# Patient Record
Sex: Female | Born: 1950 | Race: White | Hispanic: No | Marital: Married | Smoking: Never smoker
Health system: Southern US, Community
[De-identification: ages and names within clinical notes are randomized; demographics above are authoritative.]

## PROBLEM LIST (undated history)

## (undated) DIAGNOSIS — C4491 Basal cell carcinoma of skin, unspecified: Secondary | ICD-10-CM

## (undated) DIAGNOSIS — B159 Hepatitis A without hepatic coma: Secondary | ICD-10-CM

## (undated) DIAGNOSIS — Z91B Personal risk factor of exposure to diethylstilbestrol: Secondary | ICD-10-CM

## (undated) DIAGNOSIS — D6862 Lupus anticoagulant syndrome: Secondary | ICD-10-CM

## (undated) DIAGNOSIS — J45909 Unspecified asthma, uncomplicated: Secondary | ICD-10-CM

## (undated) DIAGNOSIS — Z9189 Other specified personal risk factors, not elsewhere classified: Secondary | ICD-10-CM

## (undated) DIAGNOSIS — O039 Complete or unspecified spontaneous abortion without complication: Secondary | ICD-10-CM

## (undated) DIAGNOSIS — G43909 Migraine, unspecified, not intractable, without status migrainosus: Secondary | ICD-10-CM

## (undated) DIAGNOSIS — Z8619 Personal history of other infectious and parasitic diseases: Secondary | ICD-10-CM

## (undated) DIAGNOSIS — C50919 Malignant neoplasm of unspecified site of unspecified female breast: Secondary | ICD-10-CM

## (undated) HISTORY — DX: Migraine, unspecified, not intractable, without status migrainosus: G43.909

## (undated) HISTORY — DX: Personal history of other infectious and parasitic diseases: Z86.19

## (undated) HISTORY — PX: BASAL CELL CARCINOMA EXCISION: SHX1214

## (undated) HISTORY — PX: BREAST LUMPECTOMY WITH AXILLARY LYMPH NODE DISSECTION: SHX5756

## (undated) HISTORY — PX: WISDOM TOOTH EXTRACTION: SHX21

## (undated) HISTORY — DX: Other specified personal risk factors, not elsewhere classified: Z91.89

## (undated) HISTORY — DX: Malignant neoplasm of unspecified site of unspecified female breast: C50.919

## (undated) HISTORY — DX: Hepatitis a without hepatic coma: B15.9

## (undated) HISTORY — DX: Lupus anticoagulant syndrome: D68.62

## (undated) HISTORY — PX: COLONOSCOPY: SHX174

## (undated) HISTORY — DX: Personal risk factor of exposure to diethylstilbestrol: Z91.B

## (undated) HISTORY — PX: DILATION AND CURETTAGE OF UTERUS: SHX78

## (undated) HISTORY — PX: ABDOMINAL HYSTERECTOMY: SHX81

## (undated) HISTORY — DX: Unspecified asthma, uncomplicated: J45.909

## (undated) HISTORY — DX: Complete or unspecified spontaneous abortion without complication: O03.9

## (undated) HISTORY — DX: Basal cell carcinoma of skin, unspecified: C44.91

---

## 1973-12-24 HISTORY — PX: BREAST BIOPSY: SHX20

## 1999-12-25 HISTORY — PX: CERVICAL FUSION: SHX112

## 2000-10-28 ENCOUNTER — Ambulatory Visit (HOSPITAL_COMMUNITY): Admission: RE | Admit: 2000-10-28 | Discharge: 2000-10-28 | Payer: Self-pay | Admitting: Neurosurgery

## 2000-10-28 ENCOUNTER — Encounter: Payer: Self-pay | Admitting: Neurosurgery

## 2004-10-04 ENCOUNTER — Ambulatory Visit: Payer: Self-pay | Admitting: Unknown Physician Specialty

## 2005-05-22 ENCOUNTER — Ambulatory Visit: Payer: Self-pay | Admitting: Unknown Physician Specialty

## 2006-02-01 ENCOUNTER — Ambulatory Visit: Payer: Self-pay | Admitting: Unknown Physician Specialty

## 2006-06-06 ENCOUNTER — Ambulatory Visit: Payer: Self-pay | Admitting: Unknown Physician Specialty

## 2007-06-10 ENCOUNTER — Ambulatory Visit: Payer: Self-pay | Admitting: Unknown Physician Specialty

## 2008-07-15 ENCOUNTER — Ambulatory Visit: Payer: Self-pay | Admitting: Unknown Physician Specialty

## 2009-07-22 ENCOUNTER — Ambulatory Visit: Payer: Self-pay | Admitting: Unknown Physician Specialty

## 2010-07-13 ENCOUNTER — Emergency Department: Payer: Self-pay | Admitting: Internal Medicine

## 2010-09-05 ENCOUNTER — Ambulatory Visit: Payer: Self-pay | Admitting: Unknown Physician Specialty

## 2011-09-21 LAB — HM PAP SMEAR

## 2011-10-24 ENCOUNTER — Ambulatory Visit: Payer: Self-pay | Admitting: Unknown Physician Specialty

## 2011-10-24 LAB — HM MAMMOGRAPHY

## 2012-01-15 ENCOUNTER — Telehealth: Payer: Self-pay | Admitting: Internal Medicine

## 2012-01-15 NOTE — Telephone Encounter (Signed)
Left message asking patient to return my call.

## 2012-01-15 NOTE — Telephone Encounter (Signed)
patient called in asking for a NP appt. I gave her one in February due to afternoon appt.  she was on a zpak at the end of Dec., along with a steroid inhaler.  she states she still has a cough, wanted to know what she should do, was previously seen with Dr. Stoney Bang.  She states that she really didn't think she needed to find a doctor until she got sick since she just had her physical in Sept.

## 2012-01-16 NOTE — Telephone Encounter (Signed)
Patient notified that we do not have any sooner new patient appts available and for acute sickness she should be seen at Urgent Care before she is seen in Urgent Care. Patient agreed and will keep new patient appt.

## 2012-02-14 ENCOUNTER — Ambulatory Visit (INDEPENDENT_AMBULATORY_CARE_PROVIDER_SITE_OTHER): Payer: BC Managed Care – PPO | Admitting: Internal Medicine

## 2012-02-14 ENCOUNTER — Encounter: Payer: Self-pay | Admitting: Internal Medicine

## 2012-02-14 DIAGNOSIS — G4700 Insomnia, unspecified: Secondary | ICD-10-CM

## 2012-02-14 DIAGNOSIS — G43909 Migraine, unspecified, not intractable, without status migrainosus: Secondary | ICD-10-CM | POA: Insufficient documentation

## 2012-02-14 DIAGNOSIS — J45909 Unspecified asthma, uncomplicated: Secondary | ICD-10-CM | POA: Insufficient documentation

## 2012-02-14 NOTE — Progress Notes (Signed)
Subjective:    [name], female    DOB: January 25, 1951, 61 y.o.   MRN: [id number]  HPI  61 year old female with history of migraines, asthma, and insomnia presents to establish care. She reports that she is generally doing well. In regards to her asthma, she notes that over the last few weeks she has been using her Flovent and albuterol because of mild cough. She describes the cough is nonproductive. She has not heard any wheezing or had any shortness of breath. No fever, chills, sore throat, rhinorrhea. She has not had recent frequent flares of her asthma. However, in the past has required oral steroids and antibiotics for bronchitis. She is not a smoker.  In regards to her migraines, she notes that they're typically well controlled with use of propanolol. She occasionally requires use of Fiorinal. She recently had a migraine this week in which she required 3 doses of Fiorinal. She describes the headaches as diffuse and associated with nausea but no vomiting. She does not have visual changes.    In regards to her insomnia, she reports control of her symptoms with the use of Ambien. She has not noted any side effects from this medication.  Outpatient Encounter Prescriptions as of 02/14/2012  Medication Sig Dispense Refill  . albuterol (PROVENTIL HFA;VENTOLIN HFA) 108 (90 BASE) MCG/ACT inhaler Inhale 2 puffs into the lungs every 6 (six) hours as needed.      . butalbital-aspirin-caffeine (FIORINAL) 50-325-40 MG per capsule Take 1 capsule by mouth 2 (two) times daily as needed.      . calcium citrate-vitamin D 200-200 MG-UNIT TABS Take 1 tablet by mouth daily.      . chlorpheniramine-HYDROcodone (TUSSIONEX) 10-8 MG/5ML LQCR Take 5 mLs by mouth every 12 (twelve) hours.      . fish oil-omega-3 fatty acids 1000 MG capsule Take 1 g by mouth 2 (two) times daily.      . fluticasone (FLOVENT HFA) 110 MCG/ACT inhaler Inhale 1 puff into the lungs 2 (two) times daily as needed.      Marland Kitchen ipratropium  (ATROVENT) 0.06 % nasal spray Place 2 sprays into the nose 3 (three) times daily.      . Melatonin 3 MG CAPS Take 1 capsule by mouth daily as needed.      . Multiple Vitamins-Minerals (MULTIVITAMIN WITH MINERALS) tablet Take 1 tablet by mouth daily.      Marland Kitchen neomycin-polymyxin-dexamethasone (MAXITROL) 0.1 % ophthalmic suspension 1 drop 4 (four) times daily.      Marland Kitchen OVER THE COUNTER MEDICATION Skin supplement along with Hyaluronic acid 60 mg      . Probiotic Product (PROBIOTIC & ACIDOPHILUS EX ST PO) Take by mouth daily.      . propranolol (INDERAL) 40 MG tablet Take 40 mg by mouth daily.       Marland Kitchen zolpidem (AMBIEN) 5 MG tablet Take 5 mg by mouth at bedtime as needed.        Review of Systems  Constitutional: Negative for fever, chills, appetite change, fatigue and unexpected weight change.  HENT: Negative for ear pain, congestion, sore throat, trouble swallowing, neck pain, voice change and sinus pressure.   Eyes: Negative for visual disturbance.  Respiratory: Positive for cough. Negative for shortness of breath, wheezing and stridor.   Cardiovascular: Negative for chest pain, palpitations and leg swelling.  Gastrointestinal: Negative for nausea, vomiting, abdominal pain, diarrhea, constipation, blood in stool, abdominal distention and anal bleeding.  Genitourinary: Negative for dysuria and flank pain.  Musculoskeletal: Negative  for myalgias, arthralgias and gait problem.  Skin: Negative for color change and rash.  Neurological: Positive for headaches. Negative for dizziness.  Hematological: Negative for adenopathy. Does not bruise/bleed easily.  Psychiatric/Behavioral: Negative for suicidal ideas, sleep disturbance and dysphoric mood. The patient is not nervous/anxious.    BP 110/60  Pulse 83  Temp(Src) 98.1 F (36.7 C) (Oral)  Ht 5' 8.5" (1.74 m)  Wt 138 lb (62.596 kg)  BMI 20.68 kg/m2  SpO2 99%     Objective:   Physical Exam  Constitutional: She is oriented to person, place, and  time. She appears well-developed and well-nourished. No distress.  HENT:  Head: Normocephalic and atraumatic.  Right Ear: External ear normal.  Left Ear: External ear normal.  Nose: Nose normal.  Mouth/Throat: Oropharynx is clear and moist. No oropharyngeal exudate.  Eyes: Conjunctivae are normal. Pupils are equal, round, and reactive to light. Right eye exhibits no discharge. Left eye exhibits no discharge. No scleral icterus.  Neck: Normal range of motion. Neck supple. No tracheal deviation present. No thyromegaly present.  Cardiovascular: Normal rate, regular rhythm, normal heart sounds and intact distal pulses.  Exam reveals no gallop and no friction rub.   No murmur heard. Pulmonary/Chest: Effort normal and breath sounds normal. No respiratory distress. She has no wheezes. She has no rales. She exhibits no tenderness.  Abdominal: Soft. Bowel sounds are normal. She exhibits no distension and no mass. There is no tenderness. There is no rebound and no guarding.  Musculoskeletal: Normal range of motion. She exhibits no edema and no tenderness.  Lymphadenopathy:    She has no cervical adenopathy.  Neurological: She is alert and oriented to person, place, and time. No cranial nerve deficit. She exhibits normal muscle tone. Coordination normal.  Skin: Skin is warm and dry. No rash noted. She is not diaphoretic. No erythema. No pallor.  Psychiatric: She has a normal mood and affect. Her behavior is normal. Judgment and thought content normal.          Assessment & Plan:

## 2012-02-14 NOTE — Assessment & Plan Note (Signed)
Symptoms well controlled with occasional use of albuterol and Flovent. Will continue. Will obtain records as to previous evaluation and management.

## 2012-02-14 NOTE — Assessment & Plan Note (Signed)
Symptoms well controlled with use of Ambien. Will continue. 

## 2012-02-14 NOTE — Assessment & Plan Note (Signed)
Symptoms well controlled with use of propanolol and occasional use of Fiorinal. Will continue. Will obtain records as to previous evaluation and management.

## 2012-02-20 ENCOUNTER — Encounter: Payer: Self-pay | Admitting: Internal Medicine

## 2012-09-24 ENCOUNTER — Encounter: Payer: Self-pay | Admitting: *Deleted

## 2012-09-24 ENCOUNTER — Other Ambulatory Visit (HOSPITAL_COMMUNITY)
Admission: RE | Admit: 2012-09-24 | Discharge: 2012-09-24 | Disposition: A | Discharge status: Home / Self Care | Payer: BC Managed Care – PPO | Source: Ambulatory Visit | Attending: Internal Medicine | Admitting: Internal Medicine

## 2012-09-24 ENCOUNTER — Encounter: Payer: Self-pay | Admitting: Internal Medicine

## 2012-09-24 ENCOUNTER — Ambulatory Visit (INDEPENDENT_AMBULATORY_CARE_PROVIDER_SITE_OTHER): Payer: BC Managed Care – PPO | Admitting: Internal Medicine

## 2012-09-24 VITALS — BP 110/80 | HR 60 | Temp 98.3°F | Ht 68.5 in | Wt 136.5 lb

## 2012-09-24 DIAGNOSIS — Z0000 Encounter for general adult medical examination without abnormal findings: Secondary | ICD-10-CM | POA: Insufficient documentation

## 2012-09-24 DIAGNOSIS — Z01419 Encounter for gynecological examination (general) (routine) without abnormal findings: Secondary | ICD-10-CM | POA: Insufficient documentation

## 2012-09-24 DIAGNOSIS — Z1151 Encounter for screening for human papillomavirus (HPV): Secondary | ICD-10-CM | POA: Insufficient documentation

## 2012-09-24 DIAGNOSIS — Z1239 Encounter for other screening for malignant neoplasm of breast: Secondary | ICD-10-CM

## 2012-09-24 LAB — COMPREHENSIVE METABOLIC PANEL
ALT: 21 U/L (ref 0–35)
BUN: 12 mg/dL (ref 6–23)
CO2: 29 mEq/L (ref 19–32)
Creatinine, Ser: 0.7 mg/dL (ref 0.4–1.2)
GFR: 95.31 mL/min (ref >60.00)
Total Bilirubin: 0.7 mg/dL (ref 0.3–1.2)

## 2012-09-24 LAB — CBC WITH DIFFERENTIAL/PLATELET
Basophils Absolute: 0.1 10*3/uL (ref 0.0–0.1)
HCT: 38.4 % (ref 36.0–46.0)
Lymphs Abs: 1.4 10*3/uL (ref 0.7–4.0)
MCV: 95.7 fl (ref 78.0–100.0)
Monocytes Absolute: 0.5 10*3/uL (ref 0.1–1.0)
Neutrophils Relative %: 64 % (ref 43.0–77.0)
Platelets: 234 10*3/uL (ref 150.0–400.0)
RDW: 12 % (ref 11.5–14.6)

## 2012-09-24 LAB — LIPID PANEL
Cholesterol: 189 mg/dL (ref 0–200)
HDL: 67.3 mg/dL (ref >39.00)
LDL Cholesterol: 108 mg/dL — ABNORMAL HIGH (ref 0–99)
Total CHOL/HDL Ratio: 3
Triglycerides: 70 mg/dL (ref 0.0–149.0)

## 2012-09-24 LAB — TSH: TSH: 2.84 u[IU]/mL (ref 0.35–5.50)

## 2012-09-24 MED ORDER — HYDROCOD POLST-CHLORPHEN POLST 10-8 MG/5ML PO LQCR: 5.0000 mL | Freq: Two times a day (BID) | ORAL | Status: DC

## 2012-09-24 MED ORDER — HYDROXYZINE HCL 10 MG PO TABS: 10.0000 mg | ORAL_TABLET | Freq: Three times a day (TID) | ORAL | Status: DC | PRN

## 2012-09-24 MED ORDER — ALBUTEROL SULFATE HFA 108 (90 BASE) MCG/ACT IN AERS: 2.0000 | INHALATION_SPRAY | Freq: Four times a day (QID) | RESPIRATORY_TRACT | Status: DC | PRN

## 2012-09-24 MED ORDER — IPRATROPIUM BROMIDE 0.06 % NA SOLN: 2.0000 | Freq: Three times a day (TID) | NASAL | Status: DC

## 2012-09-24 MED ORDER — ZOLPIDEM TARTRATE 5 MG PO TABS: 5.0000 mg | ORAL_TABLET | Freq: Every evening | ORAL | Status: DC | PRN

## 2012-09-24 MED ORDER — PROPRANOLOL HCL 40 MG PO TABS: 40.0000 mg | ORAL_TABLET | Freq: Every day | ORAL | Status: DC

## 2012-09-24 MED ORDER — BUTALBITAL-ASA-CAFFEINE 50-325-40 MG PO CAPS: 1.0000 | ORAL_CAPSULE | Freq: Two times a day (BID) | ORAL | Status: DC | PRN

## 2012-09-24 MED ORDER — FLUTICASONE PROPIONATE HFA 110 MCG/ACT IN AERO: 1.0000 | INHALATION_SPRAY | Freq: Two times a day (BID) | RESPIRATORY_TRACT | Status: DC | PRN

## 2012-09-24 NOTE — Progress Notes (Signed)
Subjective:    [name], female    DOB: June 24, 1951, 61 y.o.   MRN: [id number]  HPI 61 year old female with history of migraine headaches and asthma presents for annual exam. She reports she is generally feeling well. Over the last couple of weeks she has noticed some increased nasal drainage and hoarseness in her voice which she attributes to allergies. She has not noticed any shortness of breath or wheezing. She has not needed to use her rescue inhaler. She generally reports she is doing well. She is very active and follows a healthy diet. She is up to date on health maintenance with the exception of flu vaccine and mammogram.  Outpatient Encounter Prescriptions as of 09/24/2012  Medication Sig Dispense Refill  . albuterol (PROVENTIL HFA;VENTOLIN HFA) 108 (90 BASE) MCG/ACT inhaler Inhale 2 puffs into the lungs every 6 (six) hours as needed.  3.7 g  6  . butalbital-aspirin-caffeine (FIORINAL) 50-325-40 MG per capsule Take 1 capsule by mouth 2 (two) times daily as needed for headache.  60 capsule  4  . calcium citrate-vitamin D 200-200 MG-UNIT TABS Take 1 tablet by mouth daily.      . chlorpheniramine-HYDROcodone (TUSSIONEX) 10-8 MG/5ML LQCR Take 5 mLs by mouth every 12 (twelve) hours.  140 mL  0  . fish oil-omega-3 fatty acids 1000 MG capsule Take 1 g by mouth 2 (two) times daily.      . fluticasone (FLOVENT HFA) 110 MCG/ACT inhaler Inhale 1 puff into the lungs 2 (two) times daily as needed.  1 Inhaler  6  . ipratropium (ATROVENT) 0.06 % nasal spray Place 2 sprays into the nose 3 (three) times daily.  30 mL  6  . Melatonin 3 MG CAPS Take 1 capsule by mouth daily as needed.      . Multiple Vitamins-Minerals (MULTIVITAMIN WITH MINERALS) tablet Take 1 tablet by mouth daily.      . propranolol (INDERAL) 40 MG tablet Take 1 tablet (40 mg total) by mouth daily.  30 tablet  6  . zolpidem (AMBIEN) 5 MG tablet Take 1 tablet (5 mg total) by mouth at bedtime as needed.  30 tablet  4   BP  110/80  Pulse 60  Temp 98.3 F (36.8 C) (Oral)  Ht 5' 8.5" (1.74 m)  Wt 136 lb 8 oz (61.916 kg)  BMI 20.45 kg/m2  SpO2 99%  Review of Systems  Constitutional: Negative for fever, chills, appetite change, fatigue and unexpected weight change.  HENT: Negative for ear pain, congestion, sore throat, trouble swallowing, neck pain, voice change and sinus pressure.   Eyes: Negative for visual disturbance.  Respiratory: Negative for cough, shortness of breath, wheezing and stridor.   Cardiovascular: Negative for chest pain, palpitations and leg swelling.  Gastrointestinal: Negative for nausea, vomiting, abdominal pain, diarrhea, constipation, blood in stool, abdominal distention and anal bleeding.  Genitourinary: Negative for dysuria and flank pain.  Musculoskeletal: Negative for myalgias, arthralgias and gait problem.  Skin: Negative for color change and rash.  Neurological: Negative for dizziness and headaches.  Hematological: Negative for adenopathy. Does not bruise/bleed easily.  Psychiatric/Behavioral: Negative for suicidal ideas, disturbed wake/sleep cycle and dysphoric mood. The patient is not nervous/anxious.        Objective:   Physical Exam  Constitutional: She is oriented to person, place, and time. She appears well-developed and well-nourished. No distress.  HENT:  Head: Normocephalic and atraumatic.  Right Ear: External ear normal.  Left Ear: External ear normal.  Nose: Nose  normal.  Mouth/Throat: Oropharynx is clear and moist. No oropharyngeal exudate.  Eyes: Conjunctivae normal are normal. Pupils are equal, round, and reactive to light. Right eye exhibits no discharge. Left eye exhibits no discharge. No scleral icterus.  Neck: Normal range of motion. Neck supple. No tracheal deviation present. No thyromegaly present.  Cardiovascular: Normal rate, regular rhythm, normal heart sounds and intact distal pulses.  Exam reveals no gallop and no friction rub.   No murmur  heard. Pulmonary/Chest: Effort normal and breath sounds normal. No respiratory distress. She has no wheezes. She has no rales. She exhibits no tenderness.  Abdominal: Soft. Bowel sounds are normal. She exhibits no distension and no mass. There is no tenderness. There is no rebound and no guarding.  Genitourinary: Rectum normal, vagina normal and uterus normal. No breast swelling, tenderness, discharge or bleeding. Pelvic exam was performed with patient supine. There is no rash, tenderness or lesion on the right labia. There is no rash, tenderness or lesion on the left labia. Uterus is not enlarged and not tender. Cervix exhibits no motion tenderness, no discharge and no friability. Right adnexum displays no mass, no tenderness and no fullness. Left adnexum displays no mass, no tenderness and no fullness. No erythema or tenderness around the vagina. No vaginal discharge found.  Musculoskeletal: Normal range of motion. She exhibits no edema and no tenderness.  Lymphadenopathy:    She has no cervical adenopathy.  Neurological: She is alert and oriented to person, place, and time. No cranial nerve deficit. She exhibits normal muscle tone. Coordination normal.  Skin: Skin is warm and dry. No rash noted. She is not diaphoretic. No erythema. No pallor.  Psychiatric: She has a normal mood and affect. Her behavior is normal. Judgment and thought content normal.          Assessment & Plan:

## 2012-09-24 NOTE — Assessment & Plan Note (Signed)
General medical exam including breast and pelvic exam normal today. PAP pending. Mammogram scheduled. Pt will get flu vaccine at local pharmacy. Labs today including CBC, CMP, lipids, TSH. Follow up 1 year and prn.

## 2012-09-25 LAB — VITAMIN D 25 HYDROXY (VIT D DEFICIENCY, FRACTURES): Vit D, 25-Hydroxy: 48 ng/mL (ref 30–89)

## 2012-10-10 LAB — HM PAP SMEAR: HM Pap smear: NORMAL

## 2012-10-27 ENCOUNTER — Ambulatory Visit: Payer: Self-pay | Admitting: Internal Medicine

## 2012-10-31 ENCOUNTER — Telehealth: Payer: Self-pay | Admitting: Internal Medicine

## 2012-10-31 DIAGNOSIS — R928 Other abnormal and inconclusive findings on diagnostic imaging of breast: Secondary | ICD-10-CM

## 2012-10-31 NOTE — Telephone Encounter (Signed)
Cindy Arnold's mammogram came back incomplete. He would like to get additional films of the right breast. Please make sure that they have contacted her regarding this.  I have printed up the order for the additional views.

## 2012-10-31 NOTE — Telephone Encounter (Signed)
Patient notified via phone of results.

## 2012-11-12 ENCOUNTER — Ambulatory Visit: Payer: Self-pay | Admitting: Internal Medicine

## 2012-11-13 ENCOUNTER — Encounter: Payer: Self-pay | Admitting: Internal Medicine

## 2012-11-13 ENCOUNTER — Telehealth: Payer: Self-pay | Admitting: Internal Medicine

## 2012-11-13 DIAGNOSIS — R928 Other abnormal and inconclusive findings on diagnostic imaging of breast: Secondary | ICD-10-CM

## 2012-11-13 NOTE — Telephone Encounter (Signed)
Spoke with patient and reviewed recent ultrasound and mammogram of the breast which showed hypo-echogenic area in the right breast measuring less than 1 cm. Recommended surgical evaluation with possible biopsy. Patient would like to discuss with family and decide on preferred surgeon. She will e-mail or call back with the name of surgeon she would wish to see.

## 2012-11-14 ENCOUNTER — Encounter: Payer: Self-pay | Admitting: Internal Medicine

## 2012-11-17 ENCOUNTER — Encounter: Payer: Self-pay | Admitting: Internal Medicine

## 2012-11-18 ENCOUNTER — Telehealth: Payer: Self-pay | Admitting: General Practice

## 2012-11-18 NOTE — Telephone Encounter (Signed)
Cindy Arnold, Can you look into this?  This is an important referral that should have been set up 11/21.  We should apologize to the pt and try to figure out how this was not set up.

## 2012-11-18 NOTE — Telephone Encounter (Signed)
Yes, called pt she stated that she was unhappy in regards to not getting a call from [place] in regards to her referral for a biopsy of her rt breast. She stated that her OBGYN referred her to a specialist at [facility].

## 2012-11-18 NOTE — Telephone Encounter (Signed)
Pt states that she wanted to know what the status of the referral had been for the biopsy on her Rt Breast. Stated [facility] Noe never got back with her in regards to this appt. Pt stated that she got her own appt with [facility] Dr. Marcie Mowers.

## 2012-11-21 ENCOUNTER — Encounter: Payer: Self-pay | Admitting: Internal Medicine

## 2012-11-23 DIAGNOSIS — C50919 Malignant neoplasm of unspecified site of unspecified female breast: Secondary | ICD-10-CM

## 2012-11-23 HISTORY — DX: Malignant neoplasm of unspecified site of unspecified female breast: C50.919

## 2012-12-03 DIAGNOSIS — C50919 Malignant neoplasm of unspecified site of unspecified female breast: Secondary | ICD-10-CM | POA: Insufficient documentation

## 2012-12-03 DIAGNOSIS — Z853 Personal history of malignant neoplasm of breast: Secondary | ICD-10-CM | POA: Insufficient documentation

## 2013-01-01 IMAGING — MG MM CAD SCREENING MAMMO
1 series · 4 of 4 positions shown · non-contrast
Comparison: none

REASON FOR EXAM: SCR MAMMO NO ORDER
COMMENTS:

PROCEDURE:     MAM - MAM DGTL SCRN MAM NO ORDER W/CAD  - October 27, 2012  [DATE]
RESULT:

[R CC · right · 4 of 4 slices shown]
[im 1/4]
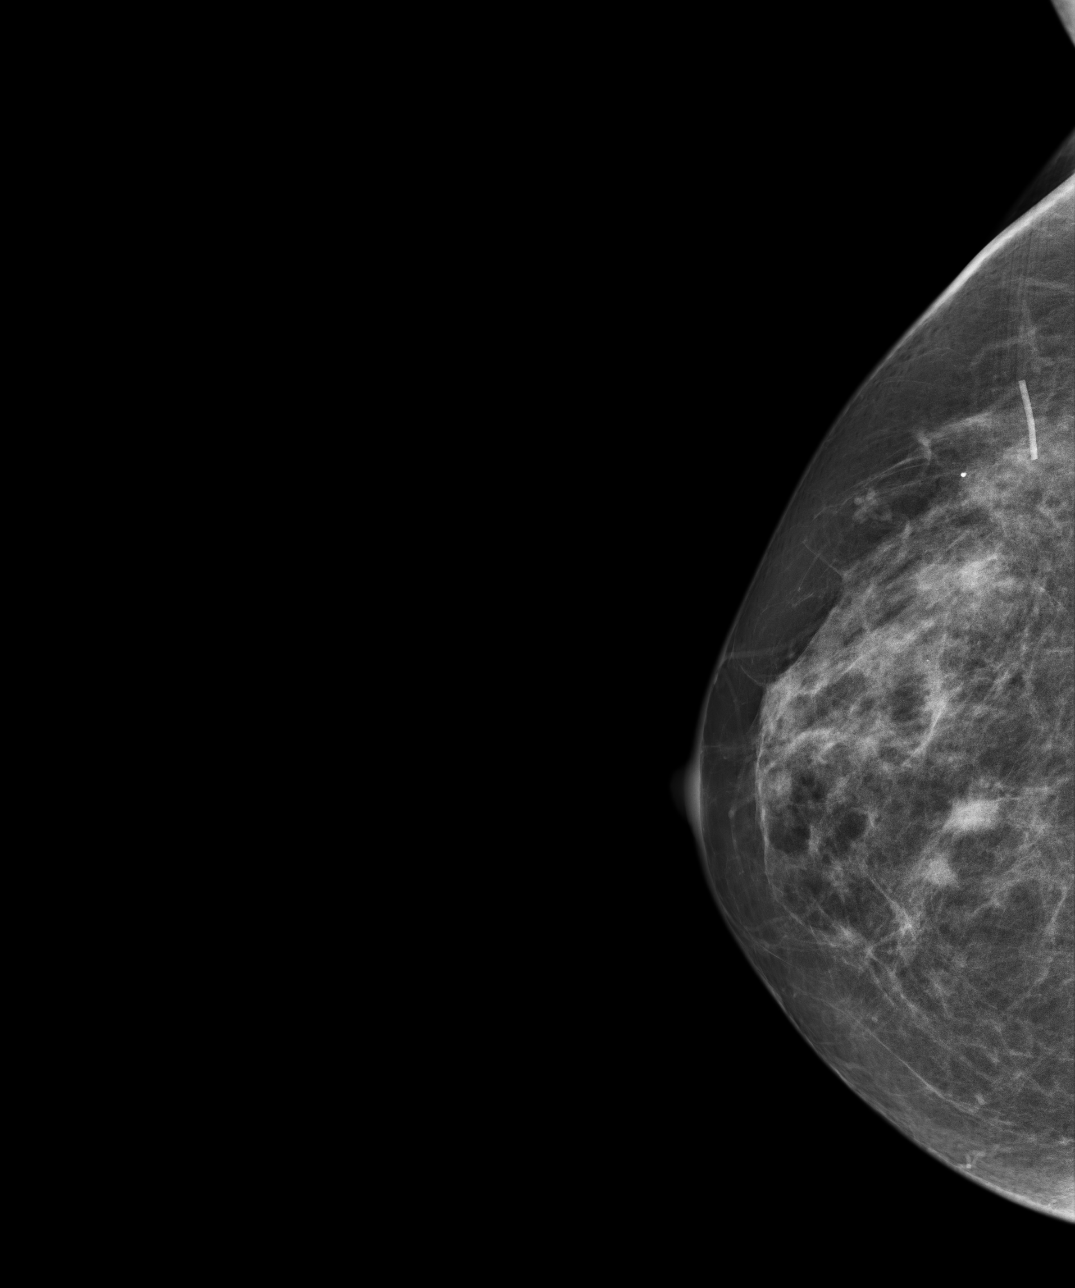
[im 2/4]
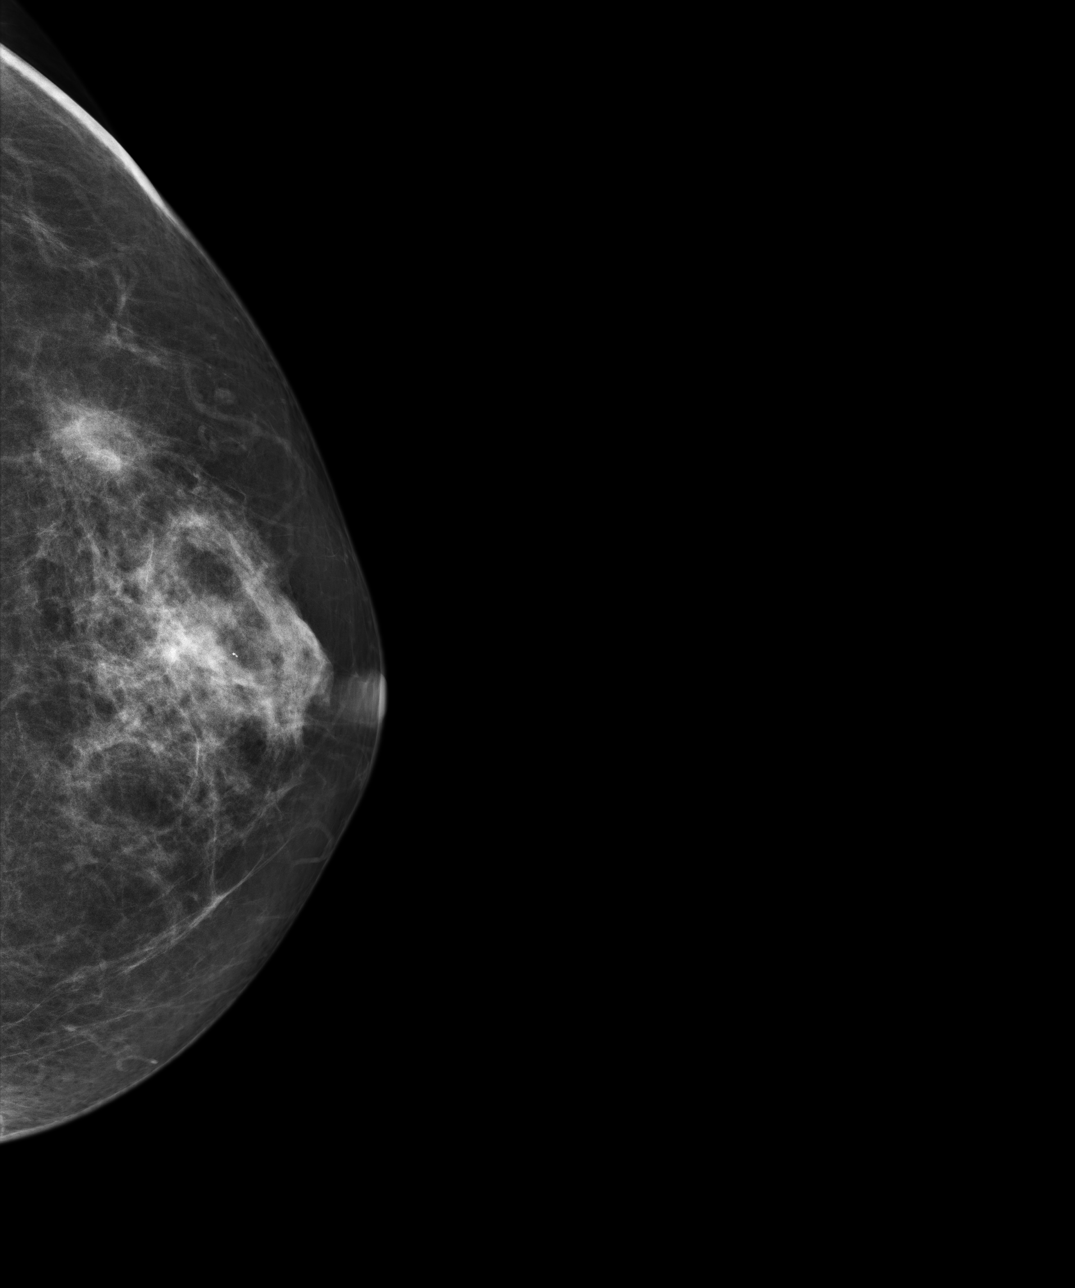
[im 3/4]
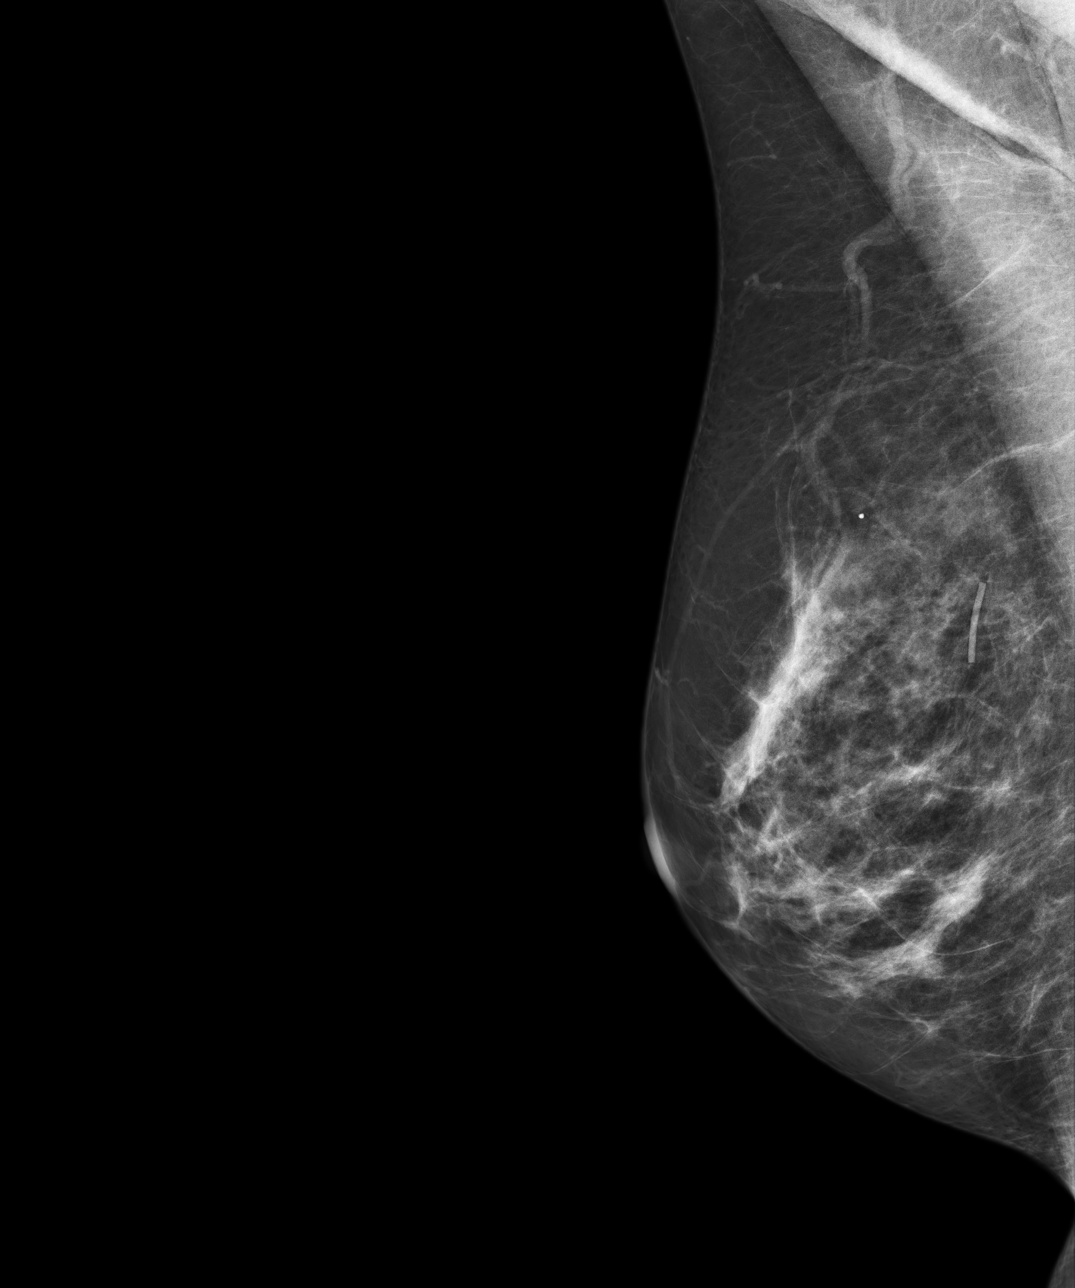
[im 4/4]
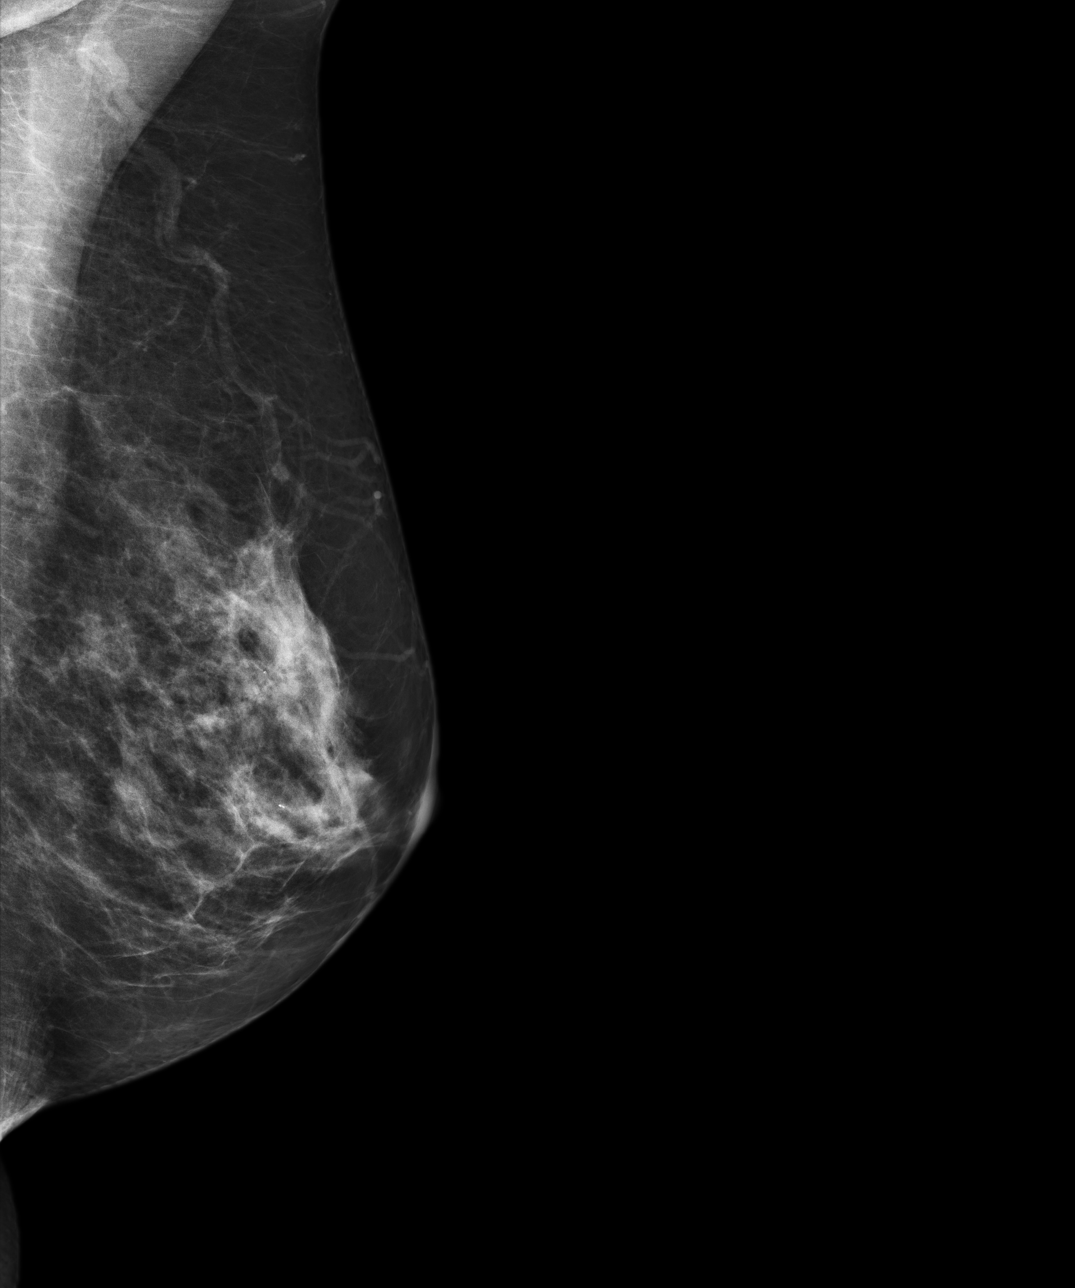

[4 of 4 positions shown; findings below may reference images not displayed]

FINDINGS: The patient reports a family history of breast cancer in two
maternal aunts. The patient has undergone a previous right breast biopsy in
1137 which was negative. Scar marker is placed in the upper outer posterior
right breast.

Images are compared to the previous digital exams dated 24 October, 2011 as
well as 05 September, 2010,22 July, 2009 and to images dated 02 December, 2003.

The breasts exhibit a moderately dense parenchymal pattern. There is new
parenchymal density in the inferior medial right breast. Additional
magnification compression of this is recommended. The appearance is
otherwise stable. Scattered, benign appearing calcifications are present.
IMPRESSION: BI-RADS: Category 0 - Needs Additional Imaging Evaluation

RECOMMENDATION:  Please have the patient return for additional magnification
compression MLO and CC views in the inferomedial right breast. The area is
marked and saved electronically. Additional medial to lateral images also
recommended. Ultrasound may be necessary for complete evaluation.

[REDACTED]

Thank you for this opportunity to contribute to the care of your patient.

A NEGATIVE MAMMOGRAM REPORT DOES NOT PRECLUDE BIOPSY OR OTHER EVALUATION OF
A CLINICALLY PALPABLE OR OTHERWISE SUSPICIOUS MASS OR LESION. BREAST CANCER
MAY NOT BE DETECTED BY MAMMOGRAPHY IN UP TO 10% OF CASES.

## 2013-02-07 ENCOUNTER — Other Ambulatory Visit: Payer: Self-pay

## 2013-07-16 ENCOUNTER — Other Ambulatory Visit: Payer: Self-pay | Admitting: *Deleted

## 2013-07-17 MED ORDER — BUTALBITAL-ASA-CAFFEINE 50-325-40 MG PO CAPS: 1.0000 | ORAL_CAPSULE | Freq: Two times a day (BID) | ORAL | Status: DC | PRN

## 2013-09-23 ENCOUNTER — Ambulatory Visit (INDEPENDENT_AMBULATORY_CARE_PROVIDER_SITE_OTHER): Payer: BC Managed Care – PPO | Admitting: Adult Health

## 2013-09-23 ENCOUNTER — Encounter: Payer: Self-pay | Admitting: Adult Health

## 2013-09-23 VITALS — BP 102/68 | HR 65 | Temp 97.9°F | Resp 12 | Wt 143.5 lb

## 2013-09-23 DIAGNOSIS — R300 Dysuria: Secondary | ICD-10-CM | POA: Insufficient documentation

## 2013-09-23 LAB — POCT URINALYSIS DIPSTICK
Bilirubin, UA: NEGATIVE
Glucose, UA: NEGATIVE
Ketones, UA: NEGATIVE
Nitrite, UA: POSITIVE
Protein, UA: 100
Urobilinogen, UA: 0.2

## 2013-09-23 MED ORDER — CIPROFLOXACIN HCL 250 MG PO TABS: 250.0000 mg | ORAL_TABLET | Freq: Two times a day (BID) | ORAL | Status: DC

## 2013-09-23 NOTE — Assessment & Plan Note (Signed)
UA dipstick shows 3+ leukocytes, positive nitrites and blood. Send urine for culture. Start Cipro 250 mg twice a day x5 days. RTC if symptoms are not improved within 3-4 days. May also try over-the-counter AZO for symptom management.

## 2013-09-23 NOTE — Progress Notes (Signed)
  Subjective:    [name], female    DOB: Aug 19, 1951, 62 y.o.   MRN: [id number]  HPI  Patient is a pleasant 62 year old female who presents to clinic with complaints of urgency, frequency, hematuria with clots. Symptoms began this morning. She is chilled but no fever. UA dipstick was obtained.  Review of Systems  Constitutional: Positive for chills. Negative for fever.  Genitourinary: Positive for dysuria, urgency, frequency and hematuria. Negative for flank pain.       Objective:   Physical Exam  Constitutional: She is oriented to person, place, and time. She appears well-developed and well-nourished. No distress.  Cardiovascular: Normal rate and regular rhythm.   Pulmonary/Chest: Effort normal. No respiratory distress.  Genitourinary:   No suprapubic discomfort or costovertebral angle tenderness  Neurological: She is alert and oriented to person, place, and time.  Psychiatric: She has a normal mood and affect. Her behavior is normal. Judgment and thought content normal.          Assessment & Plan:

## 2013-09-23 NOTE — Patient Instructions (Addendum)
  Start Cipro 250 mg twice a day for 5 days.  Try over the counter AZO for your urinary symptoms. Do not take more than 3 days.  Urinary Tract Infection Urinary tract infections (UTIs) can develop anywhere along your urinary tract. Your urinary tract is your body's drainage system for removing wastes and extra water. Your urinary tract includes two kidneys, two ureters, a bladder, and a urethra. Your kidneys are a pair of bean-shaped organs. Each kidney is about the size of your fist. They are located below your ribs, one on each side of your spine. CAUSES Infections are caused by microbes, which are microscopic organisms, including fungi, viruses, and bacteria. These organisms are so small that they can only be seen through a microscope. Bacteria are the microbes that most commonly cause UTIs. SYMPTOMS  Symptoms of UTIs may vary by age and gender of the patient and by the location of the infection. Symptoms in young women typically include a frequent and intense urge to urinate and a painful, burning feeling in the bladder or urethra during urination. Older women and men are more likely to be tired, shaky, and weak and have muscle aches and abdominal pain. A fever may mean the infection is in your kidneys. Other symptoms of a kidney infection include pain in your back or sides below the ribs, nausea, and vomiting. DIAGNOSIS To diagnose a UTI, your caregiver will ask you about your symptoms. Your caregiver also will ask to provide a urine sample. The urine sample will be tested for bacteria and white blood cells. White blood cells are made by your body to help fight infection. TREATMENT  Typically, UTIs can be treated with medication. Because most UTIs are caused by a bacterial infection, they usually can be treated with the use of antibiotics. The choice of antibiotic and length of treatment depend on your symptoms and the type of bacteria causing your infection. HOME CARE INSTRUCTIONS  If you were  prescribed antibiotics, take them exactly as your caregiver instructs you. Finish the medication even if you feel better after you have only taken some of the medication.  Drink enough water and fluids to keep your urine clear or pale yellow.  Avoid caffeine, tea, and carbonated beverages. They tend to irritate your bladder.  Empty your bladder often. Avoid holding urine for long periods of time.  Empty your bladder before and after sexual intercourse.  After a bowel movement, women should cleanse from front to back. Use each tissue only once. SEEK MEDICAL CARE IF:   You have back pain.  You develop a fever.  Your symptoms do not begin to resolve within 3 days. SEEK IMMEDIATE MEDICAL CARE IF:   You have severe back pain or lower abdominal pain.  You develop chills.  You have nausea or vomiting.  You have continued burning or discomfort with urination. MAKE SURE YOU:   Understand these instructions.  Will watch your condition.  Will get help right away if you are not doing well or get worse. Document Released: 09/19/2005 Document Revised: 06/10/2012 Document Reviewed: 01/18/2012 [facility] Patient Information 2014 [place], [place].

## 2013-09-26 LAB — URINE CULTURE

## 2013-10-06 DIAGNOSIS — M8580 Other specified disorders of bone density and structure, unspecified site: Secondary | ICD-10-CM | POA: Insufficient documentation

## 2013-10-06 DIAGNOSIS — M899 Disorder of bone, unspecified: Secondary | ICD-10-CM | POA: Insufficient documentation

## 2013-10-23 ENCOUNTER — Encounter: Payer: BC Managed Care – PPO | Admitting: Internal Medicine

## 2013-10-25 ENCOUNTER — Emergency Department: Payer: Self-pay | Admitting: Emergency Medicine

## 2013-10-29 ENCOUNTER — Other Ambulatory Visit: Payer: Self-pay

## 2013-11-05 ENCOUNTER — Encounter: Payer: Self-pay | Admitting: Internal Medicine

## 2013-11-05 ENCOUNTER — Telehealth: Payer: Self-pay | Admitting: *Deleted

## 2013-11-05 ENCOUNTER — Ambulatory Visit (INDEPENDENT_AMBULATORY_CARE_PROVIDER_SITE_OTHER): Payer: BC Managed Care – PPO | Admitting: Internal Medicine

## 2013-11-05 VITALS — BP 138/90 | HR 82 | Temp 98.2°F | Ht 68.85 in | Wt 141.5 lb

## 2013-11-05 DIAGNOSIS — C50919 Malignant neoplasm of unspecified site of unspecified female breast: Secondary | ICD-10-CM

## 2013-11-05 DIAGNOSIS — Z0000 Encounter for general adult medical examination without abnormal findings: Secondary | ICD-10-CM

## 2013-11-05 DIAGNOSIS — R079 Chest pain, unspecified: Secondary | ICD-10-CM

## 2013-11-05 DIAGNOSIS — Z23 Encounter for immunization: Secondary | ICD-10-CM | POA: Insufficient documentation

## 2013-11-05 DIAGNOSIS — R0781 Pleurodynia: Secondary | ICD-10-CM

## 2013-11-05 DIAGNOSIS — C50911 Malignant neoplasm of unspecified site of right female breast: Secondary | ICD-10-CM

## 2013-11-05 LAB — CBC WITH DIFFERENTIAL/PLATELET
Basophils Relative: 0.3 % (ref 0.0–3.0)
Eosinophils Relative: 1.9 % (ref 0.0–5.0)
HCT: 38.5 % (ref 36.0–46.0)
Hemoglobin: 13.1 g/dL (ref 12.0–15.0)
Lymphocytes Relative: 16.9 % (ref 12.0–46.0)
Lymphs Abs: 1.1 10*3/uL (ref 0.7–4.0)
Monocytes Relative: 5.6 % (ref 3.0–12.0)
Neutro Abs: 4.7 10*3/uL (ref 1.4–7.7)
Neutrophils Relative %: 75.3 % (ref 43.0–77.0)
RDW: 12.6 % (ref 11.5–14.6)
WBC: 6.3 10*3/uL (ref 4.5–10.5)

## 2013-11-05 LAB — LIPID PANEL
Cholesterol: 167 mg/dL (ref 0–200)
HDL: 61 mg/dL
LDL Cholesterol: 93 mg/dL (ref 0–99)
Total CHOL/HDL Ratio: 3
Triglycerides: 66 mg/dL (ref 0.0–149.0)
VLDL: 13.2 mg/dL (ref 0.0–40.0)

## 2013-11-05 LAB — MICROALBUMIN / CREATININE URINE RATIO: Microalb Creat Ratio: 0.6 mg/g (ref 0.0–30.0)

## 2013-11-05 LAB — COMPREHENSIVE METABOLIC PANEL WITH GFR
ALT: 14 U/L (ref 0–35)
AST: 18 U/L (ref 0–37)
Albumin: 3.9 g/dL (ref 3.5–5.2)
Alkaline Phosphatase: 75 U/L (ref 39–117)
BUN: 11 mg/dL (ref 6–23)
CO2: 29 meq/L (ref 19–32)
Calcium: 8.7 mg/dL (ref 8.4–10.5)
Chloride: 104 meq/L (ref 96–112)
Creatinine, Ser: 0.6 mg/dL (ref 0.4–1.2)
GFR: 105.81 mL/min
Glucose, Bld: 96 mg/dL (ref 70–99)
Potassium: 3.6 meq/L (ref 3.5–5.1)
Sodium: 138 meq/L (ref 135–145)
Total Bilirubin: 0.9 mg/dL (ref 0.3–1.2)
Total Protein: 7.1 g/dL (ref 6.0–8.3)

## 2013-11-05 LAB — TSH: TSH: 1.8 u[IU]/mL (ref 0.35–5.50)

## 2013-11-05 MED ORDER — IPRATROPIUM BROMIDE 0.06 % NA SOLN: 2.0000 | Freq: Three times a day (TID) | NASAL | Status: DC

## 2013-11-05 NOTE — Telephone Encounter (Signed)
Patient informed and verbalized understanding

## 2013-11-05 NOTE — Telephone Encounter (Signed)
Patient left a message on voicemail stating you mentioned something to her about getting a CXR done at [facility]. She did not see an order and would like to know what she needs to do to have this done?

## 2013-11-05 NOTE — Progress Notes (Signed)
Subjective:    [name], female    DOB: 11-Apr-1951, 62 y.o.   MRN: [id number]  HPI 62YO female presents for annual exam. Diagnosed with right breast cancer 10/2012 after having mammogram which showed abnormality. S/p lumpectomy and then additional resection because of questionable margin. S/p chemotherapy and XRT. Tolerated well, completed at [facility]. Concerned about several weeks of mild right rib pain lateral to lumpectomy site which is made worse with movement or deep inspiration. No cough, fever. Not taking any medication for this.  Otherwise, doing well. Back at work full time. Trying to follow a healthy diet and get regular exercise. Had some recent issues with left foot pain, however evaluation including plain films were normal and pain has completely resolved.  Outpatient Encounter Prescriptions as of 11/05/2013  Medication Sig  . albuterol (PROVENTIL HFA;VENTOLIN HFA) 108 (90 BASE) MCG/ACT inhaler Inhale 2 puffs into the lungs every 6 (six) hours as needed.  . butalbital-aspirin-caffeine (FIORINAL) 50-325-40 MG per capsule Take 1 capsule by mouth 2 (two) times daily as needed for headache.  . Calcium-Magnesium-Vitamin D (CALCIUM 500 PO) Take 2 tablets by mouth daily.  . chlorpheniramine-HYDROcodone (TUSSIONEX) 10-8 MG/5ML LQCR Take 5 mLs by mouth every 12 (twelve) hours as needed.  . fish oil-omega-3 fatty acids 1000 MG capsule Take 1 g by mouth 2 (two) times daily.  . fluticasone (FLOVENT HFA) 110 MCG/ACT inhaler Inhale 1 puff into the lungs 2 (two) times daily as needed.  . hydrOXYzine (ATARAX/VISTARIL) 10 MG tablet Take 1 tablet (10 mg total) by mouth 3 (three) times daily as needed for itching.  Marland Kitchen ipratropium (ATROVENT) 0.06 % nasal spray Place 2 sprays into the nose 3 (three) times daily.  Marland Kitchen letrozole (FEMARA) 2.5 MG tablet Take 2.5 mg by mouth daily.  . Melatonin 3 MG CAPS Take 1 capsule by mouth daily as needed.  . Multiple Vitamins-Minerals  (MULTIVITAMIN WITH MINERALS) tablet Take 1 tablet by mouth daily.  . propranolol (INDERAL) 40 MG tablet Take 1 tablet (40 mg total) by mouth daily.  Marland Kitchen zolpidem (AMBIEN) 5 MG tablet Take 1 tablet (5 mg total) by mouth at bedtime as needed.   BP 138/90  Pulse 82  Temp(Src) 98.2 F (36.8 C) (Oral)  Ht 5' 8.85" (1.749 m)  Wt 141 lb 8 oz (64.184 kg)  BMI 20.98 kg/m2  SpO2 96%  Review of Systems  Constitutional: Negative for fever, chills, appetite change, fatigue and unexpected weight change.  HENT: Negative for congestion, ear pain, sinus pressure, sore throat, trouble swallowing and voice change.   Eyes: Negative for visual disturbance.  Respiratory: Negative for cough, shortness of breath, wheezing and stridor.   Cardiovascular: Positive for chest pain (right rib pain). Negative for palpitations and leg swelling.  Gastrointestinal: Negative for nausea, vomiting, abdominal pain, diarrhea, constipation, blood in stool, abdominal distention and anal bleeding.  Genitourinary: Negative for dysuria and flank pain.  Musculoskeletal: Negative for arthralgias, gait problem, myalgias and neck pain.  Skin: Negative for color change and rash.  Neurological: Negative for dizziness and headaches.  Hematological: Negative for adenopathy. Does not bruise/bleed easily.  Psychiatric/Behavioral: Negative for suicidal ideas, sleep disturbance and dysphoric mood. The patient is not nervous/anxious.        Objective:   Physical Exam  Constitutional: She is oriented to person, place, and time. She appears well-developed and well-nourished. No distress.  HENT:  Head: Normocephalic and atraumatic.  Right Ear: External ear normal.  Left Ear: External ear normal.  Nose: Nose normal.  Mouth/Throat: Oropharynx is clear and moist. No oropharyngeal exudate.  Eyes: Conjunctivae are normal. Pupils are equal, round, and reactive to light. Right eye exhibits no discharge. Left eye exhibits no discharge. No scleral  icterus.  Neck: Normal range of motion. Neck supple. No tracheal deviation present. No thyromegaly present.  Cardiovascular: Normal rate, regular rhythm, normal heart sounds and intact distal pulses.  Exam reveals no gallop and no friction rub.   No murmur heard. Pulmonary/Chest: Effort normal and breath sounds normal. No accessory muscle usage. Not tachypneic. No respiratory distress. She has no decreased breath sounds. She has no wheezes. She has no rhonchi. She has no rales. She exhibits no tenderness. Right breast exhibits no inverted nipple, no mass, no nipple discharge, no skin change and no tenderness. Left breast exhibits no inverted nipple, no mass, no nipple discharge, no skin change and no tenderness. Breasts are symmetrical.    Abdominal: Soft. Bowel sounds are normal. She exhibits no distension and no mass. There is no tenderness. There is no rebound and no guarding.  Musculoskeletal: Normal range of motion. She exhibits no edema and no tenderness.  Lymphadenopathy:    She has no cervical adenopathy.  Neurological: She is alert and oriented to person, place, and time. No cranial nerve deficit. She exhibits normal muscle tone. Coordination normal.  Skin: Skin is warm and dry. No rash noted. She is not diaphoretic. No erythema. No pallor.  Psychiatric: She has a normal mood and affect. Her behavior is normal. Judgment and thought content normal.          Assessment & Plan:

## 2013-11-05 NOTE — Progress Notes (Signed)
Pre-visit discussion using our clinic review tool. No additional management support is needed unless otherwise documented below in the visit note.  

## 2013-11-05 NOTE — Assessment & Plan Note (Signed)
Right sided rib pain, exacerbated with movement and deep inspiration. Exam normal except for mild edema and erythema at site of radiation therapy and lumpectomy. Will get CXR for further evaluation.

## 2013-11-05 NOTE — Telephone Encounter (Signed)
Yes, I put the order in for her CXR at [facility]. She can go there anytime between 8am and 5pm. She does not need an appointment.

## 2013-11-05 NOTE — Assessment & Plan Note (Signed)
Reviewed course of treatment with her including lumpectomy, chemotherapy and XRT. Discussed long term monitoring of thyroid function and periodic ECHO to evaluate toxicity from chemo and radiation.

## 2013-11-05 NOTE — Assessment & Plan Note (Signed)
General medical exam normal today including breast exam except as noted. PAP and pelvic deferred as normal in 2013. Mammogram pending for later this month, to be completed at [facility]. Will check labs today including CBC, CMP, lipids, TSH, Vit D. Encouraged healthy diet and regular exercise. Pneumovax given today, given pt h/o asthma.

## 2013-11-06 LAB — VITAMIN D 25 HYDROXY (VIT D DEFICIENCY, FRACTURES): Vit D, 25-Hydroxy: 58 ng/mL (ref 30–89)

## 2013-11-09 ENCOUNTER — Encounter: Payer: Self-pay | Admitting: Internal Medicine

## 2013-11-09 ENCOUNTER — Ambulatory Visit (INDEPENDENT_AMBULATORY_CARE_PROVIDER_SITE_OTHER)
Admission: RE | Admit: 2013-11-09 | Discharge: 2013-11-09 | Disposition: A | Discharge status: Home / Self Care | Payer: BC Managed Care – PPO | Source: Ambulatory Visit | Attending: Internal Medicine | Admitting: Internal Medicine

## 2013-11-09 DIAGNOSIS — R0781 Pleurodynia: Secondary | ICD-10-CM

## 2013-11-09 DIAGNOSIS — R079 Chest pain, unspecified: Secondary | ICD-10-CM

## 2013-11-13 ENCOUNTER — Encounter: Payer: Self-pay | Admitting: Internal Medicine

## 2013-12-09 ENCOUNTER — Encounter: Payer: Self-pay | Admitting: Internal Medicine

## 2013-12-10 LAB — HM MAMMOGRAPHY

## 2014-01-02 ENCOUNTER — Other Ambulatory Visit: Payer: Self-pay | Admitting: Internal Medicine

## 2014-01-02 NOTE — Telephone Encounter (Signed)
Okay to refill? 

## 2014-01-04 ENCOUNTER — Other Ambulatory Visit: Payer: Self-pay | Admitting: *Deleted

## 2014-01-04 MED ORDER — FLUTICASONE PROPIONATE HFA 110 MCG/ACT IN AERO: 1.0000 | INHALATION_SPRAY | Freq: Two times a day (BID) | RESPIRATORY_TRACT | Status: DC | PRN

## 2014-01-04 MED ORDER — ALBUTEROL SULFATE HFA 108 (90 BASE) MCG/ACT IN AERS: 2.0000 | INHALATION_SPRAY | Freq: Four times a day (QID) | RESPIRATORY_TRACT | Status: DC | PRN

## 2014-01-04 MED ORDER — BUTALBITAL-ASA-CAFFEINE 50-325-40 MG PO CAPS: 1.0000 | ORAL_CAPSULE | Freq: Two times a day (BID) | ORAL | Status: DC | PRN

## 2014-01-04 MED ORDER — ZOLPIDEM TARTRATE 5 MG PO TABS: ORAL_TABLET | ORAL | Status: DC

## 2014-01-04 MED ORDER — HYDROXYZINE HCL 10 MG PO TABS: 10.0000 mg | ORAL_TABLET | Freq: Three times a day (TID) | ORAL | Status: DC | PRN

## 2014-01-04 NOTE — Telephone Encounter (Signed)
Rx faxed to [facility]

## 2014-01-08 ENCOUNTER — Ambulatory Visit (INDEPENDENT_AMBULATORY_CARE_PROVIDER_SITE_OTHER): Payer: BC Managed Care – PPO | Admitting: Adult Health

## 2014-01-08 ENCOUNTER — Encounter: Payer: Self-pay | Admitting: Adult Health

## 2014-01-08 VITALS — BP 100/60 | HR 58 | Resp 12 | Wt 145.0 lb

## 2014-01-08 DIAGNOSIS — IMO0001 Reserved for inherently not codable concepts without codable children: Secondary | ICD-10-CM

## 2014-01-08 DIAGNOSIS — R350 Frequency of micturition: Secondary | ICD-10-CM

## 2014-01-08 DIAGNOSIS — R3915 Urgency of urination: Secondary | ICD-10-CM

## 2014-01-08 LAB — POCT URINALYSIS DIPSTICK
BILIRUBIN UA: NEGATIVE
GLUCOSE UA: NEGATIVE
Ketones, UA: NEGATIVE
Leukocytes, UA: NEGATIVE
Nitrite, UA: NEGATIVE
Protein, UA: NEGATIVE
SPEC GRAV UA: 1.01
Urobilinogen, UA: 0.2
pH, UA: 5.5

## 2014-01-08 MED ORDER — CIPROFLOXACIN HCL 250 MG PO TABS: 250.0000 mg | ORAL_TABLET | Freq: Two times a day (BID) | ORAL | Status: DC

## 2014-01-08 NOTE — Progress Notes (Signed)
Pre visit review using our clinic review tool, if applicable. No additional management support is needed unless otherwise documented below in the visit note. 

## 2014-01-08 NOTE — Assessment & Plan Note (Signed)
UA dipstick shows some blood. She has been taking AZO which could be masking other symptoms. Send urine for culture. Start Cipro.

## 2014-01-08 NOTE — Progress Notes (Signed)
   Subjective:    [name], female    DOB: 07/25/1951, 63 y.o.   MRN: [id number]  HPI  Pt is a 63 y/o female who presents to clinic with symptoms of UTI - urgency, frequency. Taking AZO.  Review of Systems  Constitutional: Negative.  Negative for fever.  Genitourinary: Positive for urgency. Negative for dysuria, frequency, hematuria, flank pain and pelvic pain.       Objective:   Physical Exam  Constitutional: She is oriented to person, place, and time.  Genitourinary:  No suprapubic discomfort. Urgency is her only symptom  Musculoskeletal: Normal range of motion.  Neurological: She is alert and oriented to person, place, and time.  Skin: Skin is warm and dry.  Psychiatric: She has a normal mood and affect. Her behavior is normal. Judgment and thought content normal.    BP 100/60  Pulse 58  Resp 12  Wt 145 lb (65.772 kg)  SpO2 97%       Assessment & Plan:

## 2014-01-08 NOTE — Patient Instructions (Signed)
  Start Cipro 250 mg twice a day for 3 days.  Drink plenty of water to keep urine straw color.  You may continue taking AZO for symptom relief. Takes only for 3 days.  I am sending urine for culture. Once the results are available we will contact you.  Return to clinic if your symptoms are not improved after antibiotic.

## 2014-01-13 ENCOUNTER — Encounter: Payer: Self-pay | Admitting: *Deleted

## 2014-01-13 LAB — URINE CULTURE

## 2014-03-03 ENCOUNTER — Other Ambulatory Visit: Payer: Self-pay | Admitting: Internal Medicine

## 2014-03-03 NOTE — Telephone Encounter (Signed)
Left message to call back  

## 2014-03-03 NOTE — Telephone Encounter (Signed)
Can you please confirm with pt that she is taking this medication?

## 2014-03-03 NOTE — Telephone Encounter (Signed)
Patient has been seen lately however this prescription was sent to the pharmacy 09/2012 and patient is just now requesting a refill. Ok to refill, please advise.

## 2014-03-04 NOTE — Telephone Encounter (Signed)
OK fine to refill #90 with 1 refill.

## 2014-03-04 NOTE — Telephone Encounter (Signed)
Spoke with patient and she is still taking this medication and usually get 100 (3 month supply) at a time. It was originated by a MD at [facility] maybe that is why it showing up in our system like that. Ok to refill?

## 2014-03-11 ENCOUNTER — Encounter: Payer: Self-pay | Admitting: Internal Medicine

## 2014-03-12 ENCOUNTER — Ambulatory Visit (INDEPENDENT_AMBULATORY_CARE_PROVIDER_SITE_OTHER): Payer: BC Managed Care – PPO | Admitting: Internal Medicine

## 2014-03-12 ENCOUNTER — Encounter: Payer: Self-pay | Admitting: Internal Medicine

## 2014-03-12 VITALS — BP 98/70 | HR 84 | Temp 97.8°F | Wt 146.0 lb

## 2014-03-12 DIAGNOSIS — J0100 Acute maxillary sinusitis, unspecified: Secondary | ICD-10-CM

## 2014-03-12 MED ORDER — AMOXICILLIN-POT CLAVULANATE 875-125 MG PO TABS: 1.0000 | ORAL_TABLET | Freq: Two times a day (BID) | ORAL | Status: DC

## 2014-03-12 NOTE — Patient Instructions (Signed)

## 2014-03-12 NOTE — Assessment & Plan Note (Addendum)
Symptoms and exam are most consistent with maxillary sinusitis. Will treat with Augmentin. Encourage continued use of ibuprofen, Sudafed, Mucinex as needed. Followup if symptoms are not improving over the next few days.

## 2014-03-12 NOTE — Progress Notes (Signed)
Pre visit review using our clinic review tool, if applicable. No additional management support is needed unless otherwise documented below in the visit note. 

## 2014-03-12 NOTE — Progress Notes (Signed)
   Subjective:    [name], female    DOB: [birth year], 63 y.o.   MRN: [id number]  HPI 63YO female presents for acute visit.  "Head cold" last week with nasal congestion, post-nasal drip. This week developed bilateral sinus pressure, pain. Purulent nasal drainage. No h/o sinus surgery. Taking Excederin and Sudafed with no improvement. No fever, chills. Occasional cough.  Review of Systems  Constitutional: Positive for fatigue. Negative for fever, chills and unexpected weight change.  HENT: Positive for congestion and sinus pressure. Negative for ear discharge, ear pain, facial swelling, hearing loss, mouth sores, nosebleeds, postnasal drip, rhinorrhea, sneezing, sore throat, tinnitus, trouble swallowing and voice change.   Eyes: Negative for pain, discharge, redness and visual disturbance.  Respiratory: Positive for cough. Negative for chest tightness, shortness of breath, wheezing and stridor.   Cardiovascular: Negative for chest pain, palpitations and leg swelling.  Musculoskeletal: Negative for arthralgias, myalgias, neck pain and neck stiffness.  Skin: Negative for color change and rash.  Neurological: Negative for dizziness, weakness, light-headedness and headaches.  Hematological: Negative for adenopathy.       Objective:    BP 98/70  Pulse 84  Temp(Src) 97.8 F (36.6 C) (Oral)  Wt 146 lb (66.225 kg)  SpO2 95% Physical Exam  Constitutional: She is oriented to person, place, and time. She appears well-developed and well-nourished. No distress.  HENT:  Head: Normocephalic and atraumatic.  Right Ear: External ear normal.  Left Ear: External ear normal.  Nose: Mucosal edema and rhinorrhea present. Right sinus exhibits maxillary sinus tenderness. Left sinus exhibits maxillary sinus tenderness.  Mouth/Throat: Oropharynx is clear and moist. No oropharyngeal exudate.  Eyes: Conjunctivae are normal. Pupils are equal, round, and reactive to light. Right eye exhibits  no discharge. Left eye exhibits no discharge. No scleral icterus.  Neck: Normal range of motion. Neck supple. No tracheal deviation present. No thyromegaly present.  Cardiovascular: Normal rate, regular rhythm, normal heart sounds and intact distal pulses.  Exam reveals no gallop and no friction rub.   No murmur heard. Pulmonary/Chest: Effort normal and breath sounds normal. No respiratory distress. She has no wheezes. She has no rales. She exhibits no tenderness.  Musculoskeletal: Normal range of motion. She exhibits no edema and no tenderness.  Lymphadenopathy:    She has no cervical adenopathy.  Neurological: She is alert and oriented to person, place, and time. No cranial nerve deficit. She exhibits normal muscle tone. Coordination normal.  Skin: Skin is warm and dry. No rash noted. She is not diaphoretic. No erythema. No pallor.  Psychiatric: She has a normal mood and affect. Her behavior is normal. Judgment and thought content normal.          Assessment & Plan:   Problem List Items Addressed This Visit   Acute maxillary sinusitis - Primary     Exam are most consistent with maxillary sinusitis. Will treat with Augmentin. Encourage continued use of ibuprofen, Sudafed, Mucinex as needed. Followup if symptoms are not improving over the next few days.    Relevant Medications      AMOXICILLIN-POT CLAVULANATE 875-125 MG PO TABS       Return in about 4 weeks (around 04/09/2014), or if symptoms worsen or fail to improve.

## 2014-03-22 ENCOUNTER — Telehealth: Payer: Self-pay | Admitting: *Deleted

## 2014-03-22 NOTE — Telephone Encounter (Signed)
Called and informed patient of Dr. Thomes Dinning recommendations. Patient verbalized understanding.

## 2014-03-22 NOTE — Telephone Encounter (Signed)
Patient left message on voicemail stating she was seen about 10 days ago and given Augmentin for sinus infection. She took her last pill today and the sinus discharge is better, she is no longer having colored discharge it is clear now. However she is still having the sinus congestion and and sounds like she has a cold. Would like to know what the next step may be.

## 2014-03-22 NOTE — Telephone Encounter (Signed)
If the congestion is clear, I would recommend trying claritin or zyrtec D to help control symptoms of congestion. I prefer the 12 hour version of these medications, as they can disrupt sleep sometimes.  If symptoms persist, we should see her back in a visit.

## 2014-05-07 ENCOUNTER — Ambulatory Visit: Payer: BC Managed Care – PPO | Admitting: Internal Medicine

## 2014-05-10 ENCOUNTER — Ambulatory Visit (INDEPENDENT_AMBULATORY_CARE_PROVIDER_SITE_OTHER): Payer: BC Managed Care – PPO | Admitting: Internal Medicine

## 2014-05-10 ENCOUNTER — Encounter: Payer: Self-pay | Admitting: Internal Medicine

## 2014-05-10 VITALS — BP 120/80 | HR 62 | Temp 98.1°F | Ht 68.25 in | Wt 146.2 lb

## 2014-05-10 DIAGNOSIS — C50919 Malignant neoplasm of unspecified site of unspecified female breast: Secondary | ICD-10-CM

## 2014-05-10 DIAGNOSIS — G43909 Migraine, unspecified, not intractable, without status migrainosus: Secondary | ICD-10-CM

## 2014-05-10 LAB — COMPREHENSIVE METABOLIC PANEL
ALBUMIN: 4.1 g/dL (ref 3.5–5.2)
ALT: 19 U/L (ref 0–35)
AST: 23 U/L (ref 0–37)
Alkaline Phosphatase: 51 U/L (ref 39–117)
BUN: 11 mg/dL (ref 6–23)
CO2: 29 meq/L (ref 19–32)
Calcium: 9.5 mg/dL (ref 8.4–10.5)
Chloride: 105 mEq/L (ref 96–112)
Creatinine, Ser: 0.7 mg/dL (ref 0.4–1.2)
GFR: 88.66 mL/min (ref >60.00)
GLUCOSE: 90 mg/dL (ref 70–99)
POTASSIUM: 4.2 meq/L (ref 3.5–5.1)
Sodium: 140 mEq/L (ref 135–145)
TOTAL PROTEIN: 6.5 g/dL (ref 6.0–8.3)
Total Bilirubin: 0.9 mg/dL (ref 0.2–1.2)

## 2014-05-10 LAB — CBC WITH DIFFERENTIAL/PLATELET
Basophils Absolute: 0 10*3/uL (ref 0.0–0.1)
Basophils Relative: 1.1 % (ref 0.0–3.0)
EOS PCT: 1.5 % (ref 0.0–5.0)
Eosinophils Absolute: 0.1 10*3/uL (ref 0.0–0.7)
HCT: 38.5 % (ref 36.0–46.0)
Hemoglobin: 13.1 g/dL (ref 12.0–15.0)
Lymphocytes Relative: 26.2 % (ref 12.0–46.0)
Lymphs Abs: 1.1 10*3/uL (ref 0.7–4.0)
MCHC: 34.2 g/dL (ref 30.0–36.0)
MCV: 95.5 fl (ref 78.0–100.0)
MONO ABS: 0.4 10*3/uL (ref 0.1–1.0)
MONOS PCT: 9.6 % (ref 3.0–12.0)
NEUTROS PCT: 61.6 % (ref 43.0–77.0)
Neutro Abs: 2.7 10*3/uL (ref 1.4–7.7)
PLATELETS: 246 10*3/uL (ref 150.0–400.0)
RBC: 4.03 Mil/uL (ref 3.87–5.11)
RDW: 13.3 % (ref 11.5–15.5)
WBC: 4.3 10*3/uL (ref 4.0–10.5)

## 2014-05-10 LAB — TSH: TSH: 3.17 u[IU]/mL (ref 0.35–4.50)

## 2014-05-10 MED ORDER — BUTALBITAL-ASA-CAFFEINE 50-325-40 MG PO CAPS: 1.0000 | ORAL_CAPSULE | Freq: Two times a day (BID) | ORAL | Status: DC | PRN

## 2014-05-10 NOTE — Assessment & Plan Note (Addendum)
Doing well. On Aromasin. Will check CBC, CMP, TSH with labs today. Reviewed recent notes and mammogram from [place]. Follow up at [facility] in 05/2014 as scheduled. Encouraged her to ask about yearly ECHO given chemo exposure.

## 2014-05-10 NOTE — Progress Notes (Signed)
Subjective:    [name], female    DOB: 06/15/1951, 63 y.o.   MRN: [id number]  HPI 63YO female presents for follow up.  Breast cancer - Doing well. Right chest wall pain has resolved. Has follow up scheduled at [facility] in June. Last mammogram 11/18/2013 normal. Taking Aromasin with some diffuse joint aching, however manageable.   Migraines - no recent headaches, but would like to have refill on Fiorinal just in case.  Trying to increase physical activity by walking. Notes some catching in her left thumb. Plans to follow with ortho for this.  Review of Systems  Constitutional: Negative for fever, chills, appetite change, fatigue and unexpected weight change.  HENT: Negative for congestion, ear pain, sinus pressure, sore throat, trouble swallowing and voice change.   Eyes: Negative for visual disturbance.  Respiratory: Negative for cough, shortness of breath, wheezing and stridor.   Cardiovascular: Negative for chest pain, palpitations and leg swelling.  Gastrointestinal: Negative for nausea, vomiting, abdominal pain, diarrhea, constipation, blood in stool, abdominal distention and anal bleeding.  Genitourinary: Negative for dysuria and flank pain.  Musculoskeletal: Positive for arthralgias and myalgias. Negative for gait problem and neck pain.  Skin: Negative for color change and rash.  Neurological: Negative for dizziness and headaches.  Hematological: Negative for adenopathy. Does not bruise/bleed easily.  Psychiatric/Behavioral: Negative for suicidal ideas, sleep disturbance and dysphoric mood. The patient is not nervous/anxious.        Objective:    BP 120/80  Pulse 62  Temp(Src) 98.1 F (36.7 C) (Oral)  Ht 5' 8.25" (1.734 m)  Wt 146 lb 4 oz (66.339 kg)  BMI 22.06 kg/m2  SpO2 96% Physical Exam  Constitutional: She is oriented to person, place, and time. She appears well-developed and well-nourished. No distress.  HENT:  Head: Normocephalic and atraumatic.   Right Ear: External ear normal.  Left Ear: External ear normal.  Nose: Nose normal.  Mouth/Throat: Oropharynx is clear and moist. No oropharyngeal exudate.  Eyes: Conjunctivae are normal. Pupils are equal, round, and reactive to light. Right eye exhibits no discharge. Left eye exhibits no discharge. No scleral icterus.  Neck: Normal range of motion. Neck supple. No tracheal deviation present. No thyromegaly present.  Cardiovascular: Normal rate, regular rhythm, normal heart sounds and intact distal pulses.  Exam reveals no gallop and no friction rub.   No murmur heard. Pulmonary/Chest: Effort normal and breath sounds normal. No accessory muscle usage. Not tachypneic. No respiratory distress. She has no decreased breath sounds. She has no wheezes. She has no rhonchi. She has no rales. She exhibits no tenderness.  Musculoskeletal: Normal range of motion. She exhibits no edema and no tenderness.  Lymphadenopathy:    She has no cervical adenopathy.  Neurological: She is alert and oriented to person, place, and time. No cranial nerve deficit. She exhibits normal muscle tone. Coordination normal.  Skin: Skin is warm and dry. No rash noted. She is not diaphoretic. No erythema. No pallor.  Psychiatric: She has a normal mood and affect. Her behavior is normal. Judgment and thought content normal.          Assessment & Plan:  Over 30min of which >50% spent in face-to-face contact with patient discussing plan of care  Problem List Items Addressed This Visit   Malignant neoplasm of breast (female) - Primary     Doing well. On Aromasin. Will check CBC, CMP, TSH with labs today. Reviewed recent notes and mammogram from [place] [facility] [place]. Follow up at  [facility] in 05/2014 as scheduled. Encouraged her to ask about yearly ECHO given chemo exposure.    Relevant Medications      butalbital-aspirin-caffeine (FIORINAL) 50-325-40 MG per capsule   Other Relevant Orders      Comprehensive metabolic panel      CBC with  Differential      TSH   Migraines     Symptoms well controlled with prn alprazolam. Will continue.    Relevant Medications      butalbital-aspirin-caffeine (FIORINAL) 50-325-40 MG per capsule       Return in about 6 months (around 11/10/2014) for Physical.

## 2014-05-10 NOTE — Assessment & Plan Note (Signed)
Symptoms well controlled with prn alprazolam. Will continue. 

## 2014-05-10 NOTE — Progress Notes (Signed)
Pre visit review using our clinic review tool, if applicable. No additional management support is needed unless otherwise documented below in the visit note. 

## 2014-06-09 ENCOUNTER — Telehealth: Payer: Self-pay | Admitting: *Deleted

## 2014-06-09 NOTE — Telephone Encounter (Signed)
Notified pt, she was understanding and apologized for getting upset, pt states that she has an appt with one of her [facility] doctors appt next week and hopefully she will try to get it filled there. If not she will call our office to schedule an appt

## 2014-06-09 NOTE — Telephone Encounter (Signed)
Pt called wanting an Rx for Tussionex, med list shows this is a historical medication from 2013, you have never prescribed this medication to her.  I advised her that since you have not prescribed this before and that she would need to schedule an appt. Pt was not happy about this and has asked for you to give her a call.

## 2014-06-09 NOTE — Telephone Encounter (Signed)
She will need to be seen prior to writing for narcotic medications. Hydrocodone, based on new DEA guidelines, requires a hardcopy Rx.

## 2014-10-01 ENCOUNTER — Other Ambulatory Visit: Payer: Self-pay | Admitting: Internal Medicine

## 2014-10-04 ENCOUNTER — Other Ambulatory Visit: Payer: Self-pay | Admitting: Internal Medicine

## 2014-10-04 NOTE — Telephone Encounter (Signed)
Last OV 5.18.15, last refill 1.12.15.  Please advise refill

## 2014-11-10 ENCOUNTER — Encounter: Payer: Self-pay | Admitting: Internal Medicine

## 2014-11-10 ENCOUNTER — Ambulatory Visit (INDEPENDENT_AMBULATORY_CARE_PROVIDER_SITE_OTHER): Payer: BC Managed Care – PPO | Admitting: Internal Medicine

## 2014-11-10 VITALS — BP 110/70 | HR 68 | Temp 98.0°F | Ht 68.25 in | Wt 145.8 lb

## 2014-11-10 DIAGNOSIS — R109 Unspecified abdominal pain: Secondary | ICD-10-CM | POA: Insufficient documentation

## 2014-11-10 DIAGNOSIS — Z0000 Encounter for general adult medical examination without abnormal findings: Secondary | ICD-10-CM

## 2014-11-10 DIAGNOSIS — R102 Pelvic and perineal pain: Secondary | ICD-10-CM

## 2014-11-10 DIAGNOSIS — C50911 Malignant neoplasm of unspecified site of right female breast: Secondary | ICD-10-CM

## 2014-11-10 LAB — MICROALBUMIN / CREATININE URINE RATIO
Creatinine,U: 22.9 mg/dL
MICROALB/CREAT RATIO: 1.3 mg/g (ref 0.0–30.0)
Microalb, Ur: 0.3 mg/dL (ref 0.0–1.9)

## 2014-11-10 LAB — COMPREHENSIVE METABOLIC PANEL
ALT: 20 U/L (ref 0–35)
AST: 23 U/L (ref 0–37)
Albumin: 4.3 g/dL (ref 3.5–5.2)
Alkaline Phosphatase: 44 U/L (ref 39–117)
BILIRUBIN TOTAL: 0.9 mg/dL (ref 0.2–1.2)
BUN: 13 mg/dL (ref 6–23)
CO2: 29 meq/L (ref 19–32)
CREATININE: 0.8 mg/dL (ref 0.4–1.2)
Calcium: 9.3 mg/dL (ref 8.4–10.5)
Chloride: 106 mEq/L (ref 96–112)
GFR: 81.83 mL/min (ref >60.00)
GLUCOSE: 91 mg/dL (ref 70–99)
Potassium: 3.9 mEq/L (ref 3.5–5.1)
Sodium: 143 mEq/L (ref 135–145)
Total Protein: 7.1 g/dL (ref 6.0–8.3)

## 2014-11-10 LAB — LIPID PANEL
Cholesterol: 193 mg/dL (ref 0–200)
HDL: 67.8 mg/dL
LDL Cholesterol: 109 mg/dL — ABNORMAL HIGH (ref 0–99)
NonHDL: 125.2
Total CHOL/HDL Ratio: 3
Triglycerides: 80 mg/dL (ref 0.0–149.0)
VLDL: 16 mg/dL (ref 0.0–40.0)

## 2014-11-10 LAB — CBC WITH DIFFERENTIAL/PLATELET
BASOS ABS: 0 10*3/uL (ref 0.0–0.1)
Basophils Relative: 0.9 % (ref 0.0–3.0)
EOS PCT: 2.2 % (ref 0.0–5.0)
Eosinophils Absolute: 0.1 10*3/uL (ref 0.0–0.7)
HCT: 37.6 % (ref 36.0–46.0)
Hemoglobin: 12.8 g/dL (ref 12.0–15.0)
Lymphocytes Relative: 21.4 % (ref 12.0–46.0)
Lymphs Abs: 1.1 10*3/uL (ref 0.7–4.0)
MCHC: 34.1 g/dL (ref 30.0–36.0)
MCV: 95.8 fl (ref 78.0–100.0)
MONO ABS: 0.5 10*3/uL (ref 0.1–1.0)
Monocytes Relative: 9.8 % (ref 3.0–12.0)
Neutro Abs: 3.3 10*3/uL (ref 1.4–7.7)
Neutrophils Relative %: 65.7 % (ref 43.0–77.0)
Platelets: 219 10*3/uL (ref 150.0–400.0)
RBC: 3.93 Mil/uL (ref 3.87–5.11)
RDW: 11.8 % (ref 11.5–15.5)
WBC: 5.1 10*3/uL (ref 4.0–10.5)

## 2014-11-10 LAB — TSH: TSH: 2.35 u[IU]/mL (ref 0.35–4.50)

## 2014-11-10 LAB — VITAMIN D 25 HYDROXY (VIT D DEFICIENCY, FRACTURES): VITD: 41.43 ng/mL (ref 30.00–100.00)

## 2014-11-10 MED ORDER — IPRATROPIUM BROMIDE 0.06 % NA SOLN: 2.0000 | Freq: Three times a day (TID) | NASAL | Status: DC

## 2014-11-10 MED ORDER — BUTALBITAL-ASA-CAFFEINE 50-325-40 MG PO CAPS: 1.0000 | ORAL_CAPSULE | Freq: Two times a day (BID) | ORAL | Status: DC | PRN

## 2014-11-10 NOTE — Progress Notes (Signed)
Subjective:    [name], female    DOB: Dec 07, 1951, 63 y.o.   MRN: [id number]  HPI 63YO female presents for annual exam.  Yesterday, had event in which she had abdominal pain and felt lightheaded. Improved after BM.This has now resolved.   Aside from this, feeling well. Recently followed up at [facility] and has mammogram scheduled for next month.  Review of Systems  Constitutional: Negative for fever, chills, appetite change, fatigue and unexpected weight change.  Eyes: Negative for visual disturbance.  Respiratory: Negative for shortness of breath.   Cardiovascular: Negative for chest pain and leg swelling.  Gastrointestinal: Positive for abdominal pain. Negative for nausea, vomiting, diarrhea and constipation.  Musculoskeletal: Negative for myalgias and arthralgias.  Skin: Negative for color change and rash.  Hematological: Negative for adenopathy. Does not bruise/bleed easily.  Psychiatric/Behavioral: Negative for dysphoric mood. The patient is not nervous/anxious.        Objective:    BP 110/70 mmHg  Pulse 68  Temp(Src) 98 F (36.7 C) (Oral)  Ht 5' 8.25" (1.734 m)  Wt 145 lb 12 oz (66.112 kg)  BMI 21.99 kg/m2  SpO2 97% Physical Exam  Constitutional: She is oriented to person, place, and time. She appears well-developed and well-nourished. No distress.  HENT:  Head: Normocephalic and atraumatic.  Right Ear: External ear normal.  Left Ear: External ear normal.  Nose: Nose normal.  Mouth/Throat: Oropharynx is clear and moist. No oropharyngeal exudate.  Eyes: Conjunctivae are normal. Pupils are equal, round, and reactive to light. Right eye exhibits no discharge. Left eye exhibits no discharge. No scleral icterus.  Neck: Normal range of motion. Neck supple. No tracheal deviation present. No thyromegaly present.  Cardiovascular: Normal rate, regular rhythm, normal heart sounds and intact distal pulses.  Exam reveals no gallop and no friction rub.   No murmur  heard. Pulmonary/Chest: Effort normal and breath sounds normal. No accessory muscle usage. No tachypnea. No respiratory distress. She has no decreased breath sounds. She has no wheezes. She has no rales. She exhibits no tenderness. Right breast exhibits no inverted nipple, no mass, no nipple discharge, no skin change and no tenderness. Left breast exhibits no inverted nipple, no mass, no nipple discharge, no skin change and no tenderness. Breasts are symmetrical.    Abdominal: Soft. Bowel sounds are normal. She exhibits no distension and no mass. There is no tenderness. There is no rebound and no guarding.  Musculoskeletal: Normal range of motion. She exhibits no edema or tenderness.  Lymphadenopathy:    She has no cervical adenopathy.  Neurological: She is alert and oriented to person, place, and time. No cranial nerve deficit. She exhibits normal muscle tone. Coordination normal.  Skin: Skin is warm and dry. No rash noted. She is not diaphoretic. No erythema. No pallor.  Psychiatric: She has a normal mood and affect. Her behavior is normal. Judgment and thought content normal.          Assessment & Plan:   Problem List Items Addressed This Visit      Unprioritized   AP (abdominal pain)    Recent episode of lower abdominal pain. Given pt h/o malignancy, will get pelvic US for further evaluation.    Relevant Orders      US Transvaginal Non-OB   Malignant neoplasm of breast (female)    Followed at [facility]. Mammogram scheduled for 11/2014. Continue Aromasin    Relevant Medications      butalbital-aspirin-caffeine (FIORINAL) 50-325-40 MG per capsule  Routine general medical examination at a health care facility - Primary    General medical exam normal today including breast exam except as noted. PAP and pelvic deferred as normal in 2013. Mammogram pending for later this month, to be completed at [facility]. Will check labs today including CBC, CMP, lipids, TSH, Vit D. Encouraged healthy diet  and regular exercise. Immunizations UTD.      Relevant Orders      TSH      CBC with Differential      Comprehensive metabolic panel      Lipid panel      Vit D  25 hydroxy (rtn osteoporosis monitoring)      Microalbumin / creatinine urine ratio       Return in about 1 year (around 11/11/2015) for Physical.

## 2014-11-10 NOTE — Assessment & Plan Note (Signed)
Recent episode of lower abdominal pain. Given pt h/o malignancy, will get pelvic US for further evaluation.

## 2014-11-10 NOTE — Progress Notes (Signed)
Pre visit review using our clinic review tool, if applicable. No additional management support is needed unless otherwise documented below in the visit note. 

## 2014-11-10 NOTE — Addendum Note (Signed)
Addended by: Ronette Deter A on: 11/10/2014 08:37 AM   Modules accepted: Orders, SmartSet

## 2014-11-10 NOTE — Assessment & Plan Note (Signed)
Followed at [facility]. Mammogram scheduled for 11/2014. Continue Aromasin

## 2014-11-10 NOTE — Assessment & Plan Note (Signed)
General medical exam normal today including breast exam except as noted. PAP and pelvic deferred as normal in 2013. Mammogram pending for later this month, to be completed at [facility]. Will check labs today including CBC, CMP, lipids, TSH, Vit D. Encouraged healthy diet and regular exercise. Immunizations UTD.

## 2014-11-10 NOTE — Patient Instructions (Signed)

## 2014-11-11 ENCOUNTER — Ambulatory Visit: Payer: Self-pay | Admitting: Internal Medicine

## 2014-11-12 ENCOUNTER — Telehealth: Payer: Self-pay | Admitting: Internal Medicine

## 2014-11-12 NOTE — Telephone Encounter (Signed)
US pelvis showed cyst on right ovary 2.6cm. Needs follow up US in 3-6 months.

## 2014-11-30 ENCOUNTER — Encounter: Payer: Self-pay | Admitting: Internal Medicine

## 2014-12-08 LAB — HM MAMMOGRAPHY

## 2014-12-29 ENCOUNTER — Other Ambulatory Visit: Payer: Self-pay | Admitting: Internal Medicine

## 2015-01-02 ENCOUNTER — Encounter: Payer: Self-pay | Admitting: Internal Medicine

## 2015-01-18 ENCOUNTER — Encounter: Payer: Self-pay | Admitting: Internal Medicine

## 2015-01-18 ENCOUNTER — Ambulatory Visit (INDEPENDENT_AMBULATORY_CARE_PROVIDER_SITE_OTHER): Payer: BC Managed Care – PPO | Admitting: Internal Medicine

## 2015-01-18 VITALS — BP 99/65 | HR 62 | Temp 97.8°F | Ht 68.25 in | Wt 148.0 lb

## 2015-01-18 DIAGNOSIS — F419 Anxiety disorder, unspecified: Secondary | ICD-10-CM | POA: Insufficient documentation

## 2015-01-18 DIAGNOSIS — R002 Palpitations: Secondary | ICD-10-CM

## 2015-01-18 DIAGNOSIS — F411 Generalized anxiety disorder: Secondary | ICD-10-CM | POA: Insufficient documentation

## 2015-01-18 NOTE — Assessment & Plan Note (Signed)
Symptoms of anxiety related to recent death of her friend, caring for husband who has been ill. Offered support today. Will continue prn Lorazepam.

## 2015-01-18 NOTE — Assessment & Plan Note (Signed)
Palpitations noted by pt over last few months, however recently improved with no palpitations last few weeks. Exam normal RRR today. EKG unchanged. Recent labs including thyroid function, CBC, CMP normal. Will monitor for now. If recurrent symptoms would favor setting up Holter monitor.

## 2015-01-18 NOTE — Progress Notes (Signed)
Subjective:    [name], female    DOB: 11-04-1951, 64 y.o.   MRN: [id number]  HPI 64YO female presents for acute visit.  Palpitations - none recently, however had some "flutters" in the past. No dyspnea, no chest pain. No NV. Walks 2 miles daily with no symptoms.  Notes increased stress in setting of palpitations. Good friend diagnosed with CJD and died. Husband has been bedridden after ankle surgery. Also notes stress of Holidays and multiple family visiting. Started taking prn Lorazepam prescribed at [facility] for this. Notes improvement with this. Only uses on rare occasion.  Past medical, surgical, family and social history per today's encounter.  Review of Systems  Constitutional: Negative for fever, chills, appetite change, fatigue and unexpected weight change.  Eyes: Negative for visual disturbance.  Respiratory: Negative for chest tightness and shortness of breath.   Cardiovascular: Positive for palpitations. Negative for chest pain and leg swelling.  Gastrointestinal: Negative for nausea, vomiting, abdominal pain, diarrhea and constipation.  Skin: Negative for color change and rash.  Neurological: Negative for weakness and headaches.  Hematological: Negative for adenopathy. Does not bruise/bleed easily.  Psychiatric/Behavioral: Negative for dysphoric mood. The patient is nervous/anxious.        Objective:    BP 99/65 mmHg  Pulse 62  Temp(Src) 97.8 F (36.6 C) (Oral)  Ht 5' 8.25" (1.734 m)  Wt 148 lb (67.132 kg)  BMI 22.33 kg/m2  SpO2 98% Physical Exam  Constitutional: She is oriented to person, place, and time. She appears well-developed and well-nourished. No distress.  HENT:  Head: Normocephalic and atraumatic.  Right Ear: External ear normal.  Left Ear: External ear normal.  Nose: Nose normal.  Mouth/Throat: Oropharynx is clear and moist. No oropharyngeal exudate.  Eyes: Conjunctivae are normal. Pupils are equal, round, and reactive to light.  Right eye exhibits no discharge. Left eye exhibits no discharge. No scleral icterus.  Neck: Normal range of motion. Neck supple. No tracheal deviation present. No thyromegaly present.  Cardiovascular: Normal rate, regular rhythm, normal heart sounds and intact distal pulses.  Exam reveals no gallop and no friction rub.   No murmur heard. Pulmonary/Chest: Effort normal and breath sounds normal. No accessory muscle usage. No tachypnea. No respiratory distress. She has no decreased breath sounds. She has no wheezes. She has no rhonchi. She has no rales. She exhibits no tenderness.  Musculoskeletal: Normal range of motion. She exhibits no edema or tenderness.  Lymphadenopathy:    She has no cervical adenopathy.  Neurological: She is alert and oriented to person, place, and time. No cranial nerve deficit. She exhibits normal muscle tone. Coordination normal.  Skin: Skin is warm and dry. No rash noted. She is not diaphoretic. No erythema. No pallor.  Psychiatric: She has a normal mood and affect. Her behavior is normal. Judgment and thought content normal.          Assessment & Plan:  Over 58min of which >50% spent in face-to-face contact with patient discussing plan of care  Problem List Items Addressed This Visit      Unprioritized   Anxiety state    Symptoms of anxiety related to recent death of her friend, caring for husband who has been ill. Offered support today. Will continue prn Lorazepam.      Relevant Medications   LORazepam (ATIVAN) 0.5 MG tablet   Palpitations - Primary    Palpitations noted by pt over last few months, however recently improved with no palpitations last few  weeks. Exam normal RRR today. EKG unchanged. Recent labs including thyroid function, CBC, CMP normal. Will monitor for now. If recurrent symptoms would favor setting up Holter monitor.      Relevant Orders   EKG 12-Lead (Completed)       Return in about 3 months (around 04/19/2015) for Recheck.

## 2015-01-18 NOTE — Patient Instructions (Addendum)
EKG today was normal. If you have recurrent symptoms of palpitations, please let us know and we will set up a monitor to look for arrhythmia.   Follow up in 3 months and as needed.

## 2015-01-18 NOTE — Progress Notes (Signed)
Pre visit review using our clinic review tool, if applicable. No additional management support is needed unless otherwise documented below in the visit note. 

## 2015-02-18 ENCOUNTER — Telehealth: Payer: Self-pay | Admitting: Internal Medicine

## 2015-02-18 NOTE — Telephone Encounter (Signed)
Follow up pelvic US

## 2015-02-25 ENCOUNTER — Telehealth: Payer: Self-pay | Admitting: *Deleted

## 2015-02-25 DIAGNOSIS — N83209 Unspecified ovarian cyst, unspecified side: Secondary | ICD-10-CM

## 2015-02-25 NOTE — Telephone Encounter (Signed)
Pt is agreeable to having repeat ultrasound scheduled, requesting the appt be scheduled after school (3:30)

## 2015-04-20 ENCOUNTER — Encounter: Payer: Self-pay | Admitting: Internal Medicine

## 2015-04-20 ENCOUNTER — Ambulatory Visit (INDEPENDENT_AMBULATORY_CARE_PROVIDER_SITE_OTHER): Payer: BC Managed Care – PPO | Admitting: Internal Medicine

## 2015-04-20 VITALS — BP 100/68 | HR 75 | Temp 97.9°F | Ht 68.25 in | Wt 144.0 lb

## 2015-04-20 DIAGNOSIS — F411 Generalized anxiety disorder: Secondary | ICD-10-CM | POA: Diagnosis not present

## 2015-04-20 DIAGNOSIS — N83202 Unspecified ovarian cyst, left side: Secondary | ICD-10-CM

## 2015-04-20 DIAGNOSIS — N83209 Unspecified ovarian cyst, unspecified side: Secondary | ICD-10-CM | POA: Insufficient documentation

## 2015-04-20 DIAGNOSIS — C50919 Malignant neoplasm of unspecified site of unspecified female breast: Secondary | ICD-10-CM

## 2015-04-20 DIAGNOSIS — M8580 Other specified disorders of bone density and structure, unspecified site: Secondary | ICD-10-CM | POA: Diagnosis not present

## 2015-04-20 DIAGNOSIS — N8320 Unspecified ovarian cysts: Secondary | ICD-10-CM | POA: Diagnosis not present

## 2015-04-20 MED ORDER — ZOLPIDEM TARTRATE 5 MG PO TABS: 5.0000 mg | ORAL_TABLET | Freq: Every evening | ORAL | Status: DC | PRN

## 2015-04-20 MED ORDER — BUTALBITAL-ASA-CAFFEINE 50-325-40 MG PO CAPS: 1.0000 | ORAL_CAPSULE | Freq: Two times a day (BID) | ORAL | Status: DC | PRN

## 2015-04-20 MED ORDER — LORAZEPAM 0.5 MG PO TABS: 0.5000 mg | ORAL_TABLET | Freq: Three times a day (TID) | ORAL | Status: DC | PRN

## 2015-04-20 MED ORDER — ALBUTEROL SULFATE HFA 108 (90 BASE) MCG/ACT IN AERS: 2.0000 | INHALATION_SPRAY | Freq: Four times a day (QID) | RESPIRATORY_TRACT | Status: DC | PRN

## 2015-04-20 NOTE — Assessment & Plan Note (Signed)
Recent worsening anxiety with family issues. Encouraged counseling. She declines. Will continue prn Lorazepam. Follow up prn.

## 2015-04-20 NOTE — Assessment & Plan Note (Signed)
Completed Prolia injection 03/2015.

## 2015-04-20 NOTE — Progress Notes (Signed)
   Subjective:    [name], female    DOB: January 23, 1951, 64 y.o.   MRN: [id number]  HPI  64YO female presents for follow up.  Difficult time for her. Issues with children. Taking Lorazepam with some improvement. Prefers not to have counseling.  Aside from this, feeling well. Recently seen at [facility] to follow up breast cancer. No changes made to medications.  Past medical, surgical, family and social history per today's encounter.  Review of Systems  Constitutional: Negative for fever, chills, appetite change, fatigue and unexpected weight change.  Eyes: Negative for visual disturbance.  Respiratory: Negative for shortness of breath.   Cardiovascular: Negative for chest pain and leg swelling.  Gastrointestinal: Negative for nausea, vomiting, abdominal pain, diarrhea and constipation.  Skin: Negative for color change and rash.  Hematological: Negative for adenopathy. Does not bruise/bleed easily.  Psychiatric/Behavioral: Positive for sleep disturbance and dysphoric mood. Negative for suicidal ideas. The patient is nervous/anxious.        Objective:    BP 100/68 mmHg  Pulse 75  Temp(Src) 97.9 F (36.6 C) (Oral)  Ht 5' 8.25" (1.734 m)  Wt 144 lb (65.318 kg)  BMI 21.72 kg/m2  SpO2 98% Physical Exam  Constitutional: She is oriented to person, place, and time. She appears well-developed and well-nourished. No distress.  HENT:  Head: Normocephalic and atraumatic.  Right Ear: External ear normal.  Left Ear: External ear normal.  Nose: Nose normal.  Mouth/Throat: Oropharynx is clear and moist. No oropharyngeal exudate.  Eyes: Conjunctivae are normal. Pupils are equal, round, and reactive to light. Right eye exhibits no discharge. Left eye exhibits no discharge. No scleral icterus.  Neck: Normal range of motion. Neck supple. No tracheal deviation present. No thyromegaly present.  Cardiovascular: Normal rate, regular rhythm, normal heart sounds and intact distal pulses.   Exam reveals no gallop and no friction rub.   No murmur heard. Pulmonary/Chest: Effort normal and breath sounds normal. No respiratory distress. She has no wheezes. She has no rales. She exhibits no tenderness.  Musculoskeletal: Normal range of motion. She exhibits no edema or tenderness.  Lymphadenopathy:    She has no cervical adenopathy.  Neurological: She is alert and oriented to person, place, and time. No cranial nerve deficit. She exhibits normal muscle tone. Coordination normal.  Skin: Skin is warm and dry. No rash noted. She is not diaphoretic. No erythema. No pallor.  Psychiatric: Her speech is normal and behavior is normal. Judgment and thought content normal. Her mood appears anxious. Cognition and memory are normal.          Assessment & Plan:   Problem List Items Addressed This Visit      Unprioritized   Anxiety state - Primary    Recent worsening anxiety with family issues. Encouraged counseling. She declines. Will continue prn Lorazepam. Follow up prn.      Relevant Medications   LORazepam (ATIVAN) 0.5 MG tablet   Malignant neoplasm of breast (female)    Reviewed notes from [place]. Continue Aromasin.      Relevant Medications   LORazepam (ATIVAN) 0.5 MG tablet   butalbital-aspirin-caffeine (FIORINAL) 50-325-40 MG per capsule   Osteopenia    Completed Prolia injection 03/2015.      Ovarian cyst    She canceled pelvic US. Prefers to wait for year interval. Will repeat US in 10/2015.          Return in about 6 months (around 10/20/2015) for Physical.

## 2015-04-20 NOTE — Patient Instructions (Signed)
Continue current medications.  Follow up in 6 months for physical exam.

## 2015-04-20 NOTE — Progress Notes (Signed)
Pre visit review using our clinic review tool, if applicable. No additional management support is needed unless otherwise documented below in the visit note. 

## 2015-04-20 NOTE — Assessment & Plan Note (Signed)
Reviewed notes from [place]. Continue Aromasin.

## 2015-04-20 NOTE — Assessment & Plan Note (Signed)
She canceled pelvic US. Prefers to wait for year interval. Will repeat US in 10/2015.

## 2015-06-23 ENCOUNTER — Other Ambulatory Visit: Payer: Self-pay | Admitting: Internal Medicine

## 2015-07-01 ENCOUNTER — Other Ambulatory Visit: Payer: Self-pay | Admitting: Internal Medicine

## 2015-09-15 ENCOUNTER — Other Ambulatory Visit: Payer: Self-pay | Admitting: *Deleted

## 2015-09-15 MED ORDER — HYDROXYZINE HCL 10 MG PO TABS: ORAL_TABLET | ORAL | Status: DC

## 2015-09-30 ENCOUNTER — Other Ambulatory Visit: Payer: Self-pay | Admitting: Internal Medicine

## 2015-10-14 ENCOUNTER — Ambulatory Visit (INDEPENDENT_AMBULATORY_CARE_PROVIDER_SITE_OTHER): Payer: BC Managed Care – PPO | Admitting: Family Medicine

## 2015-10-14 ENCOUNTER — Encounter: Payer: Self-pay | Admitting: Family Medicine

## 2015-10-14 VITALS — BP 112/74 | HR 56 | Temp 98.5°F | Ht 68.25 in | Wt 147.6 lb

## 2015-10-14 DIAGNOSIS — R05 Cough: Secondary | ICD-10-CM | POA: Diagnosis not present

## 2015-10-14 DIAGNOSIS — R059 Cough, unspecified: Secondary | ICD-10-CM | POA: Insufficient documentation

## 2015-10-14 MED ORDER — ALBUTEROL SULFATE HFA 108 (90 BASE) MCG/ACT IN AERS: 2.0000 | INHALATION_SPRAY | Freq: Four times a day (QID) | RESPIRATORY_TRACT | Status: DC | PRN

## 2015-10-14 MED ORDER — HYDROCODONE-HOMATROPINE 5-1.5 MG/5ML PO SYRP: 5.0000 mL | ORAL_SOLUTION | Freq: Four times a day (QID) | ORAL | Status: DC | PRN

## 2015-10-14 NOTE — Progress Notes (Signed)
Pre visit review using our clinic review tool, if applicable. No additional management support is needed unless otherwise documented below in the visit note. 

## 2015-10-14 NOTE — Assessment & Plan Note (Signed)
1 week of nonproductive cough was preceded by scratchy throat and rhinorrhea. Suspect this is related to allergies especially in somebody with a history of asthma. Could be a viral bronchitis. Doubt this is an asthma exacerbation given that she has no abnormal lung sounds or shortness of breath or oxygen desaturation. She has stable vital signs is afebrile. Given her constellation of symptoms we will start over-the-counter Claritin to treat the allergic component. She'll also use her albuterol inhaler every 4-6 hours for the next 2 days and then as needed after that for possible bronchitis component. We'll hold off on treating with steroids for asthma exacerbation as she has normal lung sounds now and is having no trouble breathing. She was given a prescription for Hycodan for cough. Given return precautions.

## 2015-10-14 NOTE — Patient Instructions (Signed)
Nice to meet you. Your cough is likely related to allergies vs a viral illness. It could be related to your asthma, though this is less likely with no wheezing.  Please use the albuterol inhaler every 4-6 hours for the next 2 days then as needed. Start on claritin over the counter.  If you develop shortness of breath, fever, productive cough, wheezing, or feel poorly please seek medical attention.

## 2015-10-14 NOTE — Progress Notes (Signed)
Patient ID: SASCHA PALMA, female   DOB: [birth year], 64 y.o.   MRN: [id number]  Tommi Rumps, MD Phone: [phone]  Cindy Arnold is a 64 y.o. female who presents today for same-day visit.  Cough: Patient notes 1 week of nonproductive cough. She notes this started with a scratchy throat. She has had some rhinorrhea with this. She denies ear discomfort, sinus congestion or pressure, fever, and shortness of breath. She notes she feels well. She does have a history of asthma. She denies history of allergies, though she does use Atrovent nasal spray for rhinitis. She has history of migraine headaches though notes no headaches with this cough or headaches recently. She has no numbness, weakness, or vision changes. Has used an old albuterol inhaler with some benefit for this. She's been taking Mucinex as well. No known sick contacts.  PMH: nonsmoker.   ROS see history of present illness  Objective  Physical Exam Filed Vitals:   10/14/15 1008  BP: 112/74  Pulse: 56  Temp: 98.5 F (36.9 C)    Physical Exam  Constitutional: She is well-developed, well-nourished, and in no distress.  HENT:  Head: Normocephalic and atraumatic.  Right Ear: External ear normal.  Left Ear: External ear normal.  Mouth/Throat: Oropharynx is clear and moist. No oropharyngeal exudate.  Normal TMs bilaterally  Eyes: Conjunctivae are normal. Pupils are equal, round, and reactive to light.  Neck: Neck supple.  Cardiovascular: Normal rate, regular rhythm and normal heart sounds.  Exam reveals no gallop and no friction rub.   No murmur heard. Pulmonary/Chest: Effort normal and breath sounds normal. No respiratory distress. She has no wheezes. She has no rales.  Intermittent cough throughout the exam.  Lymphadenopathy:    She has no cervical adenopathy.  Neurological: She is alert. Gait normal.  Skin: Skin is warm and dry. She is not diaphoretic.     Assessment/Plan: Please see individual problem  list.  Cough 1 week of nonproductive cough was preceded by scratchy throat and rhinorrhea. Suspect this is related to allergies especially in somebody with a history of asthma. Could be a viral bronchitis. Doubt this is an asthma exacerbation given that she has no abnormal lung sounds or shortness of breath or oxygen desaturation. She has stable vital signs is afebrile. Given her constellation of symptoms we will start over-the-counter Claritin to treat the allergic component. She'll also use her albuterol inhaler every 4-6 hours for the next 2 days and then as needed after that for possible bronchitis component. We'll hold off on treating with steroids for asthma exacerbation as she has normal lung sounds now and is having no trouble breathing. She was given a prescription for Hycodan for cough. Given return precautions.    Meds ordered this encounter  Medications  . albuterol (PROVENTIL HFA;VENTOLIN HFA) 108 (90 BASE) MCG/ACT inhaler    Sig: Inhale 2 puffs into the lungs every 6 (six) hours as needed.    Dispense:  3.7 g    Refill:  0  . HYDROcodone-homatropine (HYCODAN) 5-1.5 MG/5ML syrup    Sig: Take 5 mLs by mouth every 6 (six) hours as needed for cough.    Dispense:  120 mL    Refill:  0     Tommi Rumps

## 2015-11-14 ENCOUNTER — Encounter: Payer: Self-pay | Admitting: Internal Medicine

## 2015-11-14 ENCOUNTER — Other Ambulatory Visit (HOSPITAL_COMMUNITY)
Admission: RE | Admit: 2015-11-14 | Discharge: 2015-11-14 | Disposition: A | Discharge status: Home / Self Care | Payer: BC Managed Care – PPO | Source: Ambulatory Visit | Attending: Internal Medicine | Admitting: Internal Medicine

## 2015-11-14 ENCOUNTER — Ambulatory Visit (INDEPENDENT_AMBULATORY_CARE_PROVIDER_SITE_OTHER): Payer: BC Managed Care – PPO | Admitting: Internal Medicine

## 2015-11-14 VITALS — BP 114/71 | HR 63 | Temp 98.0°F | Ht 68.5 in | Wt 150.1 lb

## 2015-11-14 DIAGNOSIS — Z01419 Encounter for gynecological examination (general) (routine) without abnormal findings: Secondary | ICD-10-CM | POA: Diagnosis present

## 2015-11-14 DIAGNOSIS — R05 Cough: Secondary | ICD-10-CM | POA: Diagnosis not present

## 2015-11-14 DIAGNOSIS — R059 Cough, unspecified: Secondary | ICD-10-CM | POA: Insufficient documentation

## 2015-11-14 DIAGNOSIS — Z0000 Encounter for general adult medical examination without abnormal findings: Secondary | ICD-10-CM | POA: Diagnosis not present

## 2015-11-14 DIAGNOSIS — Z1151 Encounter for screening for human papillomavirus (HPV): Secondary | ICD-10-CM | POA: Diagnosis present

## 2015-11-14 LAB — COMPREHENSIVE METABOLIC PANEL
ALK PHOS: 51 U/L (ref 39–117)
ALT: 13 U/L (ref 0–35)
AST: 19 U/L (ref 0–37)
Albumin: 4.5 g/dL (ref 3.5–5.2)
BILIRUBIN TOTAL: 0.5 mg/dL (ref 0.2–1.2)
BUN: 12 mg/dL (ref 6–23)
CO2: 26 meq/L (ref 19–32)
Calcium: 9.9 mg/dL (ref 8.4–10.5)
Chloride: 106 mEq/L (ref 96–112)
Creatinine, Ser: 0.75 mg/dL (ref 0.40–1.20)
GFR: 82.82 mL/min (ref >60.00)
GLUCOSE: 94 mg/dL (ref 70–99)
POTASSIUM: 4.1 meq/L (ref 3.5–5.1)
SODIUM: 140 meq/L (ref 135–145)
TOTAL PROTEIN: 7.2 g/dL (ref 6.0–8.3)

## 2015-11-14 LAB — LIPID PANEL
CHOL/HDL RATIO: 3
Cholesterol: 193 mg/dL (ref 0–200)
HDL: 74.2 mg/dL (ref >39.00)
LDL Cholesterol: 97 mg/dL (ref 0–99)
NONHDL: 118.34
Triglycerides: 108 mg/dL (ref 0.0–149.0)
VLDL: 21.6 mg/dL (ref 0.0–40.0)

## 2015-11-14 LAB — VITAMIN D 25 HYDROXY (VIT D DEFICIENCY, FRACTURES): VITD: 39 ng/mL (ref 30.00–100.00)

## 2015-11-14 LAB — MICROALBUMIN / CREATININE URINE RATIO
Creatinine,U: 12.1 mg/dL
Microalb Creat Ratio: 5.8 mg/g (ref 0.0–30.0)

## 2015-11-14 LAB — TSH: TSH: 2.04 u[IU]/mL (ref 0.35–4.50)

## 2015-11-14 MED ORDER — BUTALBITAL-ASA-CAFFEINE 50-325-40 MG PO CAPS: 1.0000 | ORAL_CAPSULE | Freq: Two times a day (BID) | ORAL | Status: DC | PRN

## 2015-11-14 MED ORDER — LORAZEPAM 0.5 MG PO TABS: 0.5000 mg | ORAL_TABLET | Freq: Three times a day (TID) | ORAL | Status: DC | PRN

## 2015-11-14 MED ORDER — ZOLPIDEM TARTRATE 5 MG PO TABS: 5.0000 mg | ORAL_TABLET | Freq: Every evening | ORAL | Status: DC | PRN

## 2015-11-14 MED ORDER — IPRATROPIUM BROMIDE 0.06 % NA SOLN: 2.0000 | Freq: Three times a day (TID) | NASAL | Status: DC

## 2015-11-14 NOTE — Addendum Note (Signed)
Addended by: Ronette Deter A on: 11/14/2015 09:58 AM   Modules accepted: Miquel Dunn

## 2015-11-14 NOTE — Assessment & Plan Note (Signed)
Recent persistent cough after likely viral URI. Discussed that cough from viral infection may persist for 4 weeks. Exam normal. Will get CXR given persistent symptoms. Follow up prn.

## 2015-11-14 NOTE — Assessment & Plan Note (Signed)
General medical exam normal today including breast and pelvic exam. PAP pending. Mammogram in 11/2015 at [facility]. Bone density in 11/2015 at [facility]. Immunizations UTD. Labs today. Encouraged healthy diet and exercise.

## 2015-11-14 NOTE — Patient Instructions (Signed)
Health Maintenance, Female Adopting a healthy lifestyle and getting preventive care can go a long way to promote health and wellness. Talk with your health care provider about what schedule of regular examinations is right for you. This is a good chance for you to check in with your provider about disease prevention and staying healthy. In between checkups, there are plenty of things you can do on your own. Experts have done a lot of research about which lifestyle changes and preventive measures are most likely to keep you healthy. Ask your health care provider for more information. WEIGHT AND DIET  Eat a healthy diet  Be sure to include plenty of vegetables, fruits, low-fat dairy products, and lean protein.  Do not eat a lot of foods high in solid fats, added sugars, or salt.  Get regular exercise. This is one of the most important things you can do for your health.  Most adults should exercise for at least 150 minutes each week. The exercise should increase your heart rate and make you sweat (moderate-intensity exercise).  Most adults should also do strengthening exercises at least twice a week. This is in addition to the moderate-intensity exercise.  Maintain a healthy weight  Body mass index (BMI) is a measurement that can be used to identify possible weight problems. It estimates body fat based on height and weight. Your health care provider can help determine your BMI and help you achieve or maintain a healthy weight.  For females 20 years of age and older:   A BMI below 18.5 is considered underweight.  A BMI of 18.5 to 24.9 is normal.  A BMI of 25 to 29.9 is considered overweight.  A BMI of 30 and above is considered obese.  Watch levels of cholesterol and blood lipids  You should start having your blood tested for lipids and cholesterol at 63 years of age, then have this test every 5 years.  You may need to have your cholesterol levels checked more often if:  Your lipid  or cholesterol levels are high.  You are older than 63 years of age.  You are at high risk for heart disease.  CANCER SCREENING   Lung Cancer  Lung cancer screening is recommended for adults 55-80 years old who are at high risk for lung cancer because of a history of smoking.  A yearly low-dose CT scan of the lungs is recommended for people who:  Currently smoke.  Have quit within the past 15 years.  Have at least a 30-pack-year history of smoking. A pack year is smoking an average of one pack of cigarettes a day for 1 year.  Yearly screening should continue until it has been 15 years since you quit.  Yearly screening should stop if you develop a health problem that would prevent you from having lung cancer treatment.  Breast Cancer  Practice breast self-awareness. This means understanding how your breasts normally appear and feel.  It also means doing regular breast self-exams. Let your health care provider know about any changes, no matter how small.  If you are in your 20s or 30s, you should have a clinical breast exam (CBE) by a health care provider every 1-3 years as part of a regular health exam.  If you are 40 or older, have a CBE every year. Also consider having a breast X-ray (mammogram) every year.  If you have a family history of breast cancer, talk to your health care provider about genetic screening.  If you   are at high risk for breast cancer, talk to your health care provider about having an MRI and a mammogram every year.  Breast cancer gene (BRCA) assessment is recommended for women who have family members with BRCA-related cancers. BRCA-related cancers include:  Breast.  Ovarian.  Tubal.  Peritoneal cancers.  Results of the assessment will determine the need for genetic counseling and BRCA1 and BRCA2 testing. Cervical Cancer Your health care provider may recommend that you be screened regularly for cancer of the pelvic organs (ovaries, uterus, and  vagina). This screening involves a pelvic examination, including checking for microscopic changes to the surface of your cervix (Pap test). You may be encouraged to have this screening done every 3 years, beginning at age 21.  For women ages 30-65, health care providers may recommend pelvic exams and Pap testing every 3 years, or they may recommend the Pap and pelvic exam, combined with testing for human papilloma virus (HPV), every 5 years. Some types of HPV increase your risk of cervical cancer. Testing for HPV may also be done on women of any age with unclear Pap test results.  Other health care providers may not recommend any screening for nonpregnant women who are considered low risk for pelvic cancer and who do not have symptoms. Ask your health care provider if a screening pelvic exam is right for you.  If you have had past treatment for cervical cancer or a condition that could lead to cancer, you need Pap tests and screening for cancer for at least 20 years after your treatment. If Pap tests have been discontinued, your risk factors (such as having a new sexual partner) need to be reassessed to determine if screening should resume. Some women have medical problems that increase the chance of getting cervical cancer. In these cases, your health care provider may recommend more frequent screening and Pap tests. Colorectal Cancer  This type of cancer can be detected and often prevented.  Routine colorectal cancer screening usually begins at 63 years of age and continues through 63 years of age.  Your health care provider may recommend screening at an earlier age if you have risk factors for colon cancer.  Your health care provider may also recommend using home test kits to check for hidden blood in the stool.  A small camera at the end of a tube can be used to examine your colon directly (sigmoidoscopy or colonoscopy). This is done to check for the earliest forms of colorectal  cancer.  Routine screening usually begins at age 50.  Direct examination of the colon should be repeated every 5-10 years through 63 years of age. However, you may need to be screened more often if early forms of precancerous polyps or small growths are found. Skin Cancer  Check your skin from head to toe regularly.  Tell your health care provider about any new moles or changes in moles, especially if there is a change in a mole's shape or color.  Also tell your health care provider if you have a mole that is larger than the size of a pencil eraser.  Always use sunscreen. Apply sunscreen liberally and repeatedly throughout the day.  Protect yourself by wearing long sleeves, pants, a wide-brimmed hat, and sunglasses whenever you are outside. HEART DISEASE, DIABETES, AND HIGH BLOOD PRESSURE   High blood pressure causes heart disease and increases the risk of stroke. High blood pressure is more likely to develop in:  People who have blood pressure in the high end   of the normal range (130-139/85-89 mm Hg).  People who are overweight or obese.  People who are African American.  If you are 38-23 years of age, have your blood pressure checked every 3-5 years. If you are 61 years of age or older, have your blood pressure checked every year. You should have your blood pressure measured twice--once when you are at a hospital or clinic, and once when you are not at a hospital or clinic. Record the average of the two measurements. To check your blood pressure when you are not at a hospital or clinic, you can use:  An automated blood pressure machine at a pharmacy.  A home blood pressure monitor.  If you are between 45 years and 39 years old, ask your health care provider if you should take aspirin to prevent strokes.  Have regular diabetes screenings. This involves taking a blood sample to check your fasting blood sugar level.  If you are at a normal weight and have a low risk for diabetes,  have this test once every three years after 63 years of age.  If you are overweight and have a high risk for diabetes, consider being tested at a younger age or more often. PREVENTING INFECTION  Hepatitis B  If you have a higher risk for hepatitis B, you should be screened for this virus. You are considered at high risk for hepatitis B if:  You were born in a country where hepatitis B is common. Ask your health care provider which countries are considered high risk.  Your parents were born in a high-risk country, and you have not been immunized against hepatitis B (hepatitis B vaccine).  You have HIV or AIDS.  You use needles to inject street drugs.  You live with someone who has hepatitis B.  You have had sex with someone who has hepatitis B.  You get hemodialysis treatment.  You take certain medicines for conditions, including cancer, organ transplantation, and autoimmune conditions. Hepatitis C  Blood testing is recommended for:  Everyone born from [birth year] through 1965.  Anyone with known risk factors for hepatitis C. Sexually transmitted infections (STIs)  You should be screened for sexually transmitted infections (STIs) including gonorrhea and chlamydia if:  You are sexually active and are younger than 63 years of age.  You are older than 63 years of age and your health care provider tells you that you are at risk for this type of infection.  Your sexual activity has changed since you were last screened and you are at an increased risk for chlamydia or gonorrhea. Ask your health care provider if you are at risk.  If you do not have HIV, but are at risk, it may be recommended that you take a prescription medicine daily to prevent HIV infection. This is called pre-exposure prophylaxis (PrEP). You are considered at risk if:  You are sexually active and do not regularly use condoms or know the HIV status of your partner(s).  You take drugs by injection.  You are sexually  active with a partner who has HIV. Talk with your health care provider about whether you are at high risk of being infected with HIV. If you choose to begin PrEP, you should first be tested for HIV. You should then be tested every 3 months for as long as you are taking PrEP.  PREGNANCY   If you are premenopausal and you may become pregnant, ask your health care provider about preconception counseling.  If you may  become pregnant, take 400 to 800 micrograms (mcg) of folic acid every day.  If you want to prevent pregnancy, talk to your health care provider about birth control (contraception). OSTEOPOROSIS AND MENOPAUSE   Osteoporosis is a disease in which the bones lose minerals and strength with aging. This can result in serious bone fractures. Your risk for osteoporosis can be identified using a bone density scan.  If you are 61 years of age or older, or if you are at risk for osteoporosis and fractures, ask your health care provider if you should be screened.  Ask your health care provider whether you should take a calcium or vitamin D supplement to lower your risk for osteoporosis.  Menopause may have certain physical symptoms and risks.  Hormone replacement therapy may reduce some of these symptoms and risks. Talk to your health care provider about whether hormone replacement therapy is right for you.  HOME CARE INSTRUCTIONS   Schedule regular health, dental, and eye exams.  Stay current with your immunizations.   Do not use any tobacco products including cigarettes, chewing tobacco, or electronic cigarettes.  If you are pregnant, do not drink alcohol.  If you are breastfeeding, limit how much and how often you drink alcohol.  Limit alcohol intake to no more than 1 drink per day for nonpregnant women. One drink equals 12 ounces of beer, 5 ounces of wine, or 1 ounces of hard liquor.  Do not use street drugs.  Do not share needles.  Ask your health care provider for help if  you need support or information about quitting drugs.  Tell your health care provider if you often feel depressed.  Tell your health care provider if you have ever been abused or do not feel safe at home.   This information is not intended to replace advice given to you by your health care provider. Make sure you discuss any questions you have with your health care provider.   Document Released: 06/25/2011 Document Revised: 12/31/2014 Document Reviewed: 11/11/2013 Elsevier Interactive Patient Education [employer].

## 2015-11-14 NOTE — Progress Notes (Addendum)
Subjective:    [name], female    DOB: Oct 08, 1951, 64 y.o.   MRN: [id number]  HPI  64YO female presents for annual exam.  Recently seen for cough. Continues to have some dry cough. Non-productive. No fever, chills.  Aside from this, feeling well.  Wt Readings from Last 3 Encounters:  11/14/15 150 lb 2 oz (68.096 kg)  10/14/15 147 lb 9.6 oz (66.951 kg)  04/20/15 144 lb (65.318 kg)   BP Readings from Last 3 Encounters:  11/14/15 114/71  10/14/15 112/74  04/20/15 100/68    Past Medical History  Diagnosis Date  . Miscarriage     x3  . 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 DES exposure in utero   . History of chicken pox   . Migraines   . Hepatitis A   . Hyperactive airway disease   . Basal cell carcinoma     multiple removed/[facility]  . Breast cancer ([place]) 12/13    s/p right lumpectomy, s/p chemo and XRT   Family History  Problem Relation Age of Onset  . Pancreatic cancer Mother   . Cancer Mother     Pancreatic  . Arthritis Mother   . Hyperlipidemia Mother   . Hypertension Mother   . Heart attack Father   . Hyperlipidemia Father   . Hypertension Father   . Heart disease Father 33  . Birth defects Maternal Aunt     Breast - in 51's  . Stroke Maternal Grandmother   . Colon cancer Other   . Cancer Other 50    Breast and Ovarian- dx 50's/Colon  . Birth defects Maternal Aunt     Breast - in 67's   Past Surgical History  Procedure Laterality Date  . Vaginal delivery      x4  . Dilation and curettage of uterus      x 3  . Cervical fusion  2001  . Breast biopsy  1975    Cyst  . Basal cell carcinoma excision      Multiple  . Breast lumpectomy with axillary lymph node dissection  01/06/13, 01/22/13    Dr. Drue Dun, [facility], second surgery for pos margin   Social History   Social History  . Marital Status: Married    Spouse Name: N/A  . Number of Children: 4  . Years of Education: N/A   Occupational History  . Teacher, early years/pre - [employer]    Social History Main Topics  . Smoking status: Never Smoker   . Smokeless tobacco: Never Used  . Alcohol Use: 0.0 oz/week    0 Standard drinks or equivalent per week     Comment: 3 times a week  . Drug Use: No  . Sexual Activity: Not on file   Other Topics Concern  . Not on file   Social History Narrative   Lives in [place] with husband.  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     Review of Systems  Constitutional: Negative for fever, chills, appetite change, fatigue and unexpected weight change.  Eyes: Negative for visual disturbance.  Respiratory: Positive for cough and chest tightness. Negative for shortness of breath and wheezing.   Cardiovascular: Negative for chest pain and leg swelling.  Gastrointestinal: Negative for nausea, vomiting, abdominal pain, diarrhea and constipation.  Musculoskeletal: Positive for myalgias and arthralgias.  Skin: Negative for color change and rash.  Hematological: Negative for adenopathy. Does not bruise/bleed easily.  Psychiatric/Behavioral: Negative for sleep disturbance and dysphoric  mood. The patient is not nervous/anxious.        Objective:    BP 114/71 mmHg  Pulse 63  Temp(Src) 98 F (36.7 C) (Oral)  Ht 5' 8.5" (1.74 m)  Wt 150 lb 2 oz (68.096 kg)  BMI 22.49 kg/m2  SpO2 98% Physical Exam  Constitutional: She is oriented to person, place, and time. She appears well-developed and well-nourished. No distress.  HENT:  Head: Normocephalic and atraumatic.  Right Ear: External ear normal.  Left Ear: External ear normal.  Nose: Nose normal.  Mouth/Throat: Oropharynx is clear and moist. No oropharyngeal exudate.  Eyes: Conjunctivae are normal. Pupils are equal, round, and reactive to light. Right eye exhibits no discharge. Left eye exhibits no discharge. No scleral icterus.  Neck: Normal range of motion. Neck supple. No tracheal deviation present. No thyromegaly present.  Cardiovascular: Normal rate, regular rhythm,  normal heart sounds and intact distal pulses.  Exam reveals no gallop and no friction rub.   No murmur heard. Pulmonary/Chest: Effort normal and breath sounds normal. No respiratory distress. She has no wheezes. She has no rales. She exhibits no tenderness.  Abdominal: Soft. Bowel sounds are normal. She exhibits no distension and no mass. There is no tenderness. There is no rebound and no guarding.  Genitourinary: Rectum normal, vagina normal and uterus normal. No breast swelling, tenderness, discharge or bleeding. Pelvic exam was performed with patient supine. There is no rash, tenderness or lesion on the right labia. There is no rash, tenderness or lesion on the left labia. Uterus is not enlarged and not tender. Cervix exhibits friability. Cervix exhibits no motion tenderness and no discharge. Right adnexum displays no mass, no tenderness and no fullness. Left adnexum displays no mass, no tenderness and no fullness. No erythema or tenderness in the vagina. No vaginal discharge found.  Musculoskeletal: Normal range of motion. She exhibits no edema or tenderness.  Lymphadenopathy:    She has no cervical adenopathy.  Neurological: She is alert and oriented to person, place, and time. No cranial nerve deficit. She exhibits normal muscle tone. Coordination normal.  Skin: Skin is warm and dry. No rash noted. She is not diaphoretic. No erythema. No pallor.  Psychiatric: She has a normal mood and affect. Her behavior is normal. Judgment and thought content normal.          Assessment & Plan:   Problem List Items Addressed This Visit      Unprioritized   Cough    Recent persistent cough after likely viral URI. Discussed that cough from viral infection may persist for 4 weeks. Exam normal. Will get CXR given persistent symptoms. Follow up prn.      Relevant Orders   DG Chest 2 View   Routine general medical examination at a health care facility - Primary    General medical exam normal today  including breast and pelvic exam. PAP pending. Mammogram in 11/2015 at [facility]. Bone density in 11/2015 at [facility]. Immunizations UTD. Labs today. Encouraged healthy diet and exercise.      Relevant Orders   CBC with Differential/Platelet   Comprehensive metabolic panel   Lipid panel   Microalbumin / creatinine urine ratio   VITAMIN D 25 Hydroxy (Vit-D Deficiency, Fractures)   TSH   Cytology - PAP       Return in about 6 months (around 05/13/2016) for Recheck.

## 2015-11-14 NOTE — Progress Notes (Signed)
Pre visit review using our clinic review tool, if applicable. No additional management support is needed unless otherwise documented below in the visit note. 

## 2015-11-15 LAB — CBC WITH DIFFERENTIAL/PLATELET
BASOS ABS: 0 10*3/uL (ref 0.0–0.1)
Basophils Relative: 0.4 % (ref 0.0–3.0)
Eosinophils Absolute: 0.1 10*3/uL (ref 0.0–0.7)
Eosinophils Relative: 2.1 % (ref 0.0–5.0)
HCT: 39.7 % (ref 36.0–46.0)
Hemoglobin: 13.2 g/dL (ref 12.0–15.0)
LYMPHS ABS: 1.3 10*3/uL (ref 0.7–4.0)
Lymphocytes Relative: 26.7 % (ref 12.0–46.0)
MCHC: 33.2 g/dL (ref 30.0–36.0)
MCV: 97.8 fl (ref 78.0–100.0)
MONO ABS: 0.4 10*3/uL (ref 0.1–1.0)
Monocytes Relative: 7.7 % (ref 3.0–12.0)
NEUTROS PCT: 63.1 % (ref 43.0–77.0)
Neutro Abs: 3.2 10*3/uL (ref 1.4–7.7)
Platelets: 258 10*3/uL (ref 150.0–400.0)
RBC: 4.05 Mil/uL (ref 3.87–5.11)
RDW: 12.4 % (ref 11.5–15.5)
WBC: 5.1 10*3/uL (ref 4.0–10.5)

## 2015-11-15 LAB — CYTOLOGY - PAP

## 2015-11-16 ENCOUNTER — Ambulatory Visit
Admission: RE | Admit: 2015-11-16 | Discharge: 2015-11-16 | Disposition: A | Discharge status: Home / Self Care | Payer: BC Managed Care – PPO | Source: Ambulatory Visit | Attending: Internal Medicine | Admitting: Internal Medicine

## 2015-11-16 DIAGNOSIS — R059 Cough, unspecified: Secondary | ICD-10-CM

## 2015-11-16 DIAGNOSIS — R05 Cough: Secondary | ICD-10-CM

## 2015-11-25 LAB — HM MAMMOGRAPHY

## 2015-11-28 ENCOUNTER — Ambulatory Visit (INDEPENDENT_AMBULATORY_CARE_PROVIDER_SITE_OTHER): Payer: BC Managed Care – PPO | Admitting: Family Medicine

## 2015-11-28 ENCOUNTER — Encounter: Payer: Self-pay | Admitting: Family Medicine

## 2015-11-28 VITALS — BP 110/72 | HR 66 | Temp 98.5°F | Ht 68.5 in | Wt 148.8 lb

## 2015-11-28 DIAGNOSIS — J019 Acute sinusitis, unspecified: Secondary | ICD-10-CM

## 2015-11-28 MED ORDER — AMOXICILLIN-POT CLAVULANATE 875-125 MG PO TABS: 1.0000 | ORAL_TABLET | Freq: Two times a day (BID) | ORAL | Status: DC

## 2015-11-28 MED ORDER — HYDROXYZINE HCL 10 MG PO TABS: ORAL_TABLET | ORAL | Status: DC

## 2015-11-28 MED ORDER — PROPRANOLOL HCL 40 MG PO TABS
40.0000 mg | ORAL_TABLET | Freq: Every day | ORAL | Status: DC
Start: 2015-11-28 — End: 2016-05-25

## 2015-11-28 MED ORDER — PREDNISONE 50 MG PO TABS: ORAL_TABLET | ORAL | Status: DC

## 2015-11-28 NOTE — Progress Notes (Signed)
Pre visit review using our clinic review tool, if applicable. No additional management support is needed unless otherwise documented below in the visit note. 

## 2015-11-28 NOTE — Assessment & Plan Note (Addendum)
New acute problem. Treating with Augmentin. Rx sent. Advised continued use of Nettie pot. Follow up PRN.

## 2015-11-28 NOTE — Patient Instructions (Addendum)
It was nice to see you today.  Take the antibiotic as prescribed.  You can continue the netti-pot at home.  Follow up:  Return if symptoms worsen or fail to improve.  Take care  Dr. Lacinda Axon

## 2015-11-28 NOTE — Progress Notes (Signed)
Subjective:  [name], female    DOB: 1951-09-21  Age: 64 y.o. MRN: [id number]  CC: Sinus infection  HPI:  64 year old female presents to clinic today for an acute visit with complaints of sinus infection.  ? Sinus infection  Patient reports that she's been sick since the day after Thanksgiving.  She states that he's had thick nasal mucus which is discolored.   She reports associated sinus pressure and pain.  She states she also has an occasional cough.  She reports using Sudafed, Excedrin, Nettie pot with no improvement.  No known exacerbating factors.   No associated fever, chills.  Symptoms are severe per patient.  Social Hx   Social History   Social History  . Marital Status: Married    Spouse Name: N/A  . Number of Children: 4  . Years of Education: N/A   Occupational History  . Corporate treasurer - [employer]    Social History Main Topics  . Smoking status: Never Smoker   . Smokeless tobacco: Never Used  . Alcohol Use: 0.0 oz/week    0 Standard drinks or equivalent per week     Comment: 3 times a week  . Drug Use: No  . Sexual Activity: Not Asked   Other Topics Concern  . None   Social History Narrative   Lives in [place] with husband.  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    Review of Systems  Constitutional: Negative for fever and chills.  HENT: Positive for congestion and sinus pressure.   Respiratory: Positive for cough.    Objective:  BP 110/72 mmHg  Pulse 66  Temp(Src) 98.5 F (36.9 C) (Oral)  Ht 5' 8.5" (1.74 m)  Wt 148 lb 12 oz (67.473 kg)  BMI 22.29 kg/m2  SpO2 93%  BP/Weight 11/28/2015 11/14/2015 [id number]  Systolic BP [id number]  Diastolic BP 72 71 74  Wt. (Lbs) 148.75 150.13 147.6  BMI 22.29 22.49 22.27   Physical Exam  Constitutional: She appears well-developed. No distress.  HENT:  Head: Normocephalic and atraumatic.  Right Ear: External ear normal.  Left Ear: External ear normal.  Mouth/Throat: Oropharynx  is clear and moist.  Mild maxillary sinus tenderness.   Eyes: Conjunctivae are normal. Right eye exhibits no discharge. Left eye exhibits no discharge.  Neck: Neck supple.  Cardiovascular: Normal rate and regular rhythm.   No murmur heard. Pulmonary/Chest: Effort normal and breath sounds normal.  Abdominal: Soft. She exhibits no distension. There is no tenderness.  Lymphadenopathy:    She has no cervical adenopathy.  Neurological: She is alert.  Psychiatric: She has a normal mood and affect.  Vitals reviewed.  Lab Results  Component Value Date   WBC 5.1 11/14/2015   HGB 13.2 11/14/2015   HCT 39.7 11/14/2015   PLT 258.0 11/14/2015   GLUCOSE 94 11/14/2015   CHOL 193 11/14/2015   TRIG 108.0 11/14/2015   HDL 74.20 11/14/2015   LDLCALC 97 11/14/2015   ALT 13 11/14/2015   AST 19 11/14/2015   NA 140 11/14/2015   K 4.1 11/14/2015   CL 106 11/14/2015   CREATININE 0.75 11/14/2015   BUN 12 11/14/2015   CO2 26 11/14/2015   TSH 2.04 11/14/2015   MICROALBUR <0.7 11/14/2015    Assessment & Plan:   Problem List Items Addressed This Visit    Acute sinusitis - Primary    New acute problem. Treating with Augmentin. Rx sent. Advised continued use of Nettie pot. Follow up PRN.  Relevant Medications   amoxicillin-clavulanate (AUGMENTIN) 875-125 MG tablet     Meds ordered this encounter  Medications  . propranolol (INDERAL) 40 MG tablet    Sig: Take 1 tablet (40 mg total) by mouth daily.    Dispense:  90 tablet    Refill:  0  . hydrOXYzine (ATARAX/VISTARIL) 10 MG tablet    Sig: TAKE ONE TABLET THREE TIMES A DAY AS NEEDED FOR ITCHING    Dispense:  30 tablet    Refill:  0  . amoxicillin-clavulanate (AUGMENTIN) 875-125 MG tablet    Sig: Take 1 tablet by mouth 2 (two) times daily.    Dispense:  20 tablet    Refill:  0  . DISCONTD: predniSONE (DELTASONE) 50 MG tablet    Sig: 1 tablet daily for 5 days.    Dispense:  5 tablet    Refill:  0    Follow-up: Return if symptoms  worsen or fail to improve.  Lee Vining

## 2016-04-09 ENCOUNTER — Encounter: Payer: Self-pay | Admitting: Family Medicine

## 2016-04-09 ENCOUNTER — Ambulatory Visit (INDEPENDENT_AMBULATORY_CARE_PROVIDER_SITE_OTHER): Payer: BC Managed Care – PPO | Admitting: Family Medicine

## 2016-04-09 VITALS — BP 112/78 | HR 65 | Temp 98.2°F | Ht 68.5 in | Wt 149.5 lb

## 2016-04-09 DIAGNOSIS — N390 Urinary tract infection, site not specified: Secondary | ICD-10-CM | POA: Insufficient documentation

## 2016-04-09 DIAGNOSIS — R399 Unspecified symptoms and signs involving the genitourinary system: Secondary | ICD-10-CM | POA: Diagnosis not present

## 2016-04-09 LAB — POCT URINALYSIS DIPSTICK
BILIRUBIN UA: NEGATIVE
Glucose, UA: NEGATIVE
Ketones, UA: NEGATIVE
NITRITE UA: NEGATIVE
PH UA: 7
PROTEIN UA: NEGATIVE
Spec Grav, UA: 1.01
UROBILINOGEN UA: 0.2

## 2016-04-09 MED ORDER — CEPHALEXIN 500 MG PO CAPS
500.0000 mg | ORAL_CAPSULE | Freq: Three times a day (TID) | ORAL | Status: DC
Start: 2016-04-09 — End: 2016-05-25

## 2016-04-09 NOTE — Progress Notes (Signed)
Subjective:  [name], female    DOB: 1951-06-24  Age: 65 y.o. MRN: [id number]  CC: UTI  HPI:  65 year old female presents to clinic today with concerns for UTI.  Patient states that she she developed symptoms on Thursday. She's been experiencing frequency, urgency, and mild burning with urination. No associated fever or chills. No nausea or vomiting. No flank pain. No known exacerbating or relieving factors. No other complaints today.  Social Hx   Social History   Social History  . Marital Status: Married    Spouse Name: N/A  . Number of Children: 4  . Years of Education: N/A   Occupational History  . Corporate treasurer - [employer]    Social History Main Topics  . Smoking status: Never Smoker   . Smokeless tobacco: Never Used  . Alcohol Use: 0.0 oz/week    0 Standard drinks or equivalent per week     Comment: 3 times a week  . Drug Use: No  . Sexual Activity: Not Asked   Other Topics Concern  . None   Social History Narrative   Lives in [place] with husband.  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    Review of Systems  Constitutional: Negative for fever.  Gastrointestinal: Negative for nausea and vomiting.  Genitourinary: Positive for dysuria, urgency and frequency. Negative for flank pain.   Objective:  BP 112/78 mmHg  Pulse 65  Temp(Src) 98.2 F (36.8 C) (Oral)  Ht 5' 8.5" (1.74 m)  Wt 149 lb 8 oz (67.813 kg)  BMI 22.40 kg/m2  SpO2 98%  BP/Weight 04/09/2016 11/28/2015 123456  Systolic BP [id number]  Diastolic BP 78 72 71  Wt. (Lbs) 149.5 148.75 150.13  BMI 22.4 22.29 22.49    Physical Exam  Constitutional: She is oriented to person, place, and time. She appears well-developed. No distress.  Cardiovascular: Normal rate and regular rhythm.   Pulmonary/Chest: Effort normal and breath sounds normal.  Abdominal: Soft. She exhibits no distension. There is no tenderness. There is no rebound and no guarding.  Neurological: She is alert and  oriented to person, place, and time.  Psychiatric: She has a normal mood and affect.  Vitals reviewed.  Lab Results  Component Value Date   WBC 5.1 11/14/2015   HGB 13.2 11/14/2015   HCT 39.7 11/14/2015   PLT 258.0 11/14/2015   GLUCOSE 94 11/14/2015   CHOL 193 11/14/2015   TRIG 108.0 11/14/2015   HDL 74.20 11/14/2015   LDLCALC 97 11/14/2015   ALT 13 11/14/2015   AST 19 11/14/2015   NA 140 11/14/2015   K 4.1 11/14/2015   CL 106 11/14/2015   CREATININE 0.75 11/14/2015   BUN 12 11/14/2015   CO2 26 11/14/2015   TSH 2.04 11/14/2015   MICROALBUR <0.7 11/14/2015   Results for orders placed or performed in visit on 04/09/16 (from the past 24 hour(s))  POCT Urinalysis Dipstick     Status: Abnormal   Collection Time: 04/09/16 10:29 AM  Result Value Ref Range   Color, UA light yellow    Clarity, UA clear    Glucose, UA neg    Bilirubin, UA neg    Ketones, UA neg    Spec Grav, UA 1.010    Blood, UA trace-intact    pH, UA 7.0    Protein, UA neg    Urobilinogen, UA 0.2    Nitrite, UA neg    Leukocytes, UA moderate (2+) (A) Negative   Assessment &  Plan:   Problem List Items Addressed This Visit    UTI (lower urinary tract infection) - Primary    New problem. History and urinalysis consistent with UTI. Sending for culture. Treating empirically with Keflex.      Relevant Medications   cephALEXin (KEFLEX) 500 MG capsule   Other Relevant Orders   POCT Urinalysis Dipstick (Completed)   Urine culture      Meds ordered this encounter  Medications  . cephALEXin (KEFLEX) 500 MG capsule    Sig: Take 1 capsule (500 mg total) by mouth 3 (three) times daily.    Dispense:  15 capsule    Refill:  0    Follow-up: PRN  [place]

## 2016-04-09 NOTE — Patient Instructions (Signed)
Take the medication as prescribed.  Follow up as needed.  Take care  Dr. Lacinda Axon

## 2016-04-09 NOTE — Progress Notes (Signed)
Pre visit review using our clinic review tool, if applicable. No additional management support is needed unless otherwise documented below in the visit note. 

## 2016-04-09 NOTE — Assessment & Plan Note (Signed)
New problem. History and urinalysis consistent with UTI. Sending for culture. Treating empirically with Keflex. 

## 2016-04-12 LAB — URINE CULTURE

## 2016-05-14 ENCOUNTER — Ambulatory Visit: Payer: BC Managed Care – PPO | Admitting: Internal Medicine

## 2016-05-25 ENCOUNTER — Ambulatory Visit (INDEPENDENT_AMBULATORY_CARE_PROVIDER_SITE_OTHER): Payer: BC Managed Care – PPO | Admitting: Internal Medicine

## 2016-05-25 ENCOUNTER — Encounter: Payer: Self-pay | Admitting: Internal Medicine

## 2016-05-25 VITALS — BP 102/62 | HR 77 | Ht 68.5 in | Wt 148.0 lb

## 2016-05-25 DIAGNOSIS — C50919 Malignant neoplasm of unspecified site of unspecified female breast: Secondary | ICD-10-CM

## 2016-05-25 DIAGNOSIS — G43809 Other migraine, not intractable, without status migrainosus: Secondary | ICD-10-CM | POA: Diagnosis not present

## 2016-05-25 DIAGNOSIS — G4700 Insomnia, unspecified: Secondary | ICD-10-CM

## 2016-05-25 LAB — COMPREHENSIVE METABOLIC PANEL
ALT: 11 U/L (ref 0–35)
AST: 16 U/L (ref 0–37)
Albumin: 4.4 g/dL (ref 3.5–5.2)
Alkaline Phosphatase: 86 U/L (ref 39–117)
BILIRUBIN TOTAL: 0.5 mg/dL (ref 0.2–1.2)
BUN: 11 mg/dL (ref 6–23)
CHLORIDE: 101 meq/L (ref 96–112)
CO2: 29 meq/L (ref 19–32)
CREATININE: 0.83 mg/dL (ref 0.40–1.20)
Calcium: 9.4 mg/dL (ref 8.4–10.5)
GFR: 73.55 mL/min (ref >60.00)
GLUCOSE: 82 mg/dL (ref 70–99)
Potassium: 4 mEq/L (ref 3.5–5.1)
Sodium: 138 mEq/L (ref 135–145)
Total Protein: 6.6 g/dL (ref 6.0–8.3)

## 2016-05-25 MED ORDER — ZOLPIDEM TARTRATE 5 MG PO TABS: 5.0000 mg | ORAL_TABLET | Freq: Every evening | ORAL | Status: DC | PRN

## 2016-05-25 MED ORDER — PROPRANOLOL HCL 40 MG PO TABS: 40.0000 mg | ORAL_TABLET | Freq: Every day | ORAL | Status: DC

## 2016-05-25 MED ORDER — LORAZEPAM 0.5 MG PO TABS: 0.5000 mg | ORAL_TABLET | Freq: Three times a day (TID) | ORAL | Status: DC | PRN

## 2016-05-25 MED ORDER — IPRATROPIUM BROMIDE 0.06 % NA SOLN: 2.0000 | Freq: Three times a day (TID) | NASAL | Status: DC

## 2016-05-25 MED ORDER — BUTALBITAL-ASA-CAFFEINE 50-325-40 MG PO CAPS: 1.0000 | ORAL_CAPSULE | Freq: Two times a day (BID) | ORAL | Status: DC | PRN

## 2016-05-25 NOTE — Assessment & Plan Note (Signed)
Symptoms well controlled with prn Ambien. Will continue.

## 2016-05-25 NOTE — Assessment & Plan Note (Signed)
Reviewed mammogram from [place] in 11/2015. Will continue Aromasin.

## 2016-05-25 NOTE — Patient Instructions (Signed)
Labs today

## 2016-05-25 NOTE — Assessment & Plan Note (Signed)
Symptoms well controlled with Propranolol and prn Butalbital. Will continue.

## 2016-05-25 NOTE — Progress Notes (Signed)
Subjective:    [name], female    DOB: 1951/02/04, 65 y.o.   MRN: [id number]  HPI  65YO female presents for follow up.  Feeling well generally. Some clear nasal drainage last few days.  Migraines - Symptoms generally well controlled. Mild headache today related to nasal congestion.  Breast cancer - Compliant with aromasin. Tolerating well. Last mammogram at [facility] in 11/2015 was normal.   Wt Readings from Last 3 Encounters:  05/25/16 148 lb (67.132 kg)  04/09/16 149 lb 8 oz (67.813 kg)  11/28/15 148 lb 12 oz (67.473 kg)   BP Readings from Last 3 Encounters:  05/25/16 102/62  04/09/16 112/78  11/28/15 110/72    Past Medical History  Diagnosis Date  . Miscarriage     x3  . 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 DES exposure in utero   . History of chicken pox   . Migraines   . Hepatitis A   . Hyperactive airway disease   . Basal cell carcinoma     multiple removed/[facility]  . Breast cancer ([place]) 12/13    s/p right lumpectomy, s/p chemo and XRT   Family History  Problem Relation Age of Onset  . Pancreatic cancer Mother   . Cancer Mother     Pancreatic  . Arthritis Mother   . Hyperlipidemia Mother   . Hypertension Mother   . Heart attack Father   . Hyperlipidemia Father   . Hypertension Father   . Heart disease Father 6  . Birth defects Maternal Aunt     Breast - in 32's  . Stroke Maternal Grandmother   . Colon cancer Other   . Cancer Other 50    Breast and Ovarian- dx 50's/Colon  . Birth defects Maternal Aunt     Breast - in 30's   Past Surgical History  Procedure Laterality Date  . Vaginal delivery      x4  . Dilation and curettage of uterus      x 3  . Cervical fusion  2001  . Breast biopsy  1975    Cyst  . Basal cell carcinoma excision      Multiple  . Breast lumpectomy with axillary lymph node dissection  01/06/13, 01/22/13    Dr. Drue Dun, [facility], second surgery for pos margin   Social History   Social History   . Marital Status: Married    Spouse Name: N/A  . Number of Children: 4  . Years of Education: N/A   Occupational History  . 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 Smoking status: Never Smoker   . Smokeless tobacco: Never Used  . Alcohol Use: 0.0 oz/week    0 Standard drinks or equivalent per week     Comment: 3 times a week  . Drug Use: No  . Sexual Activity: Not Asked   Other Topics Concern  . None   Social History Narrative   Lives in [place] with husband.  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     Review of Systems  Constitutional: Negative for fever, chills, appetite change, fatigue and unexpected weight change.  HENT: Positive for congestion. Negative for postnasal drip, rhinorrhea, sinus pressure, sneezing, sore throat and trouble swallowing.   Eyes: Negative for visual disturbance.  Respiratory: Negative for shortness of breath.   Cardiovascular: Negative for chest pain and leg swelling.  Gastrointestinal: Negative for nausea, vomiting, abdominal pain, diarrhea and constipation.  Musculoskeletal:  Negative for myalgias and arthralgias.  Skin: Negative for color change and rash.  Neurological: Positive for headaches. Negative for weakness.  Hematological: Negative for adenopathy. Does not bruise/bleed easily.  Psychiatric/Behavioral: Negative for suicidal ideas, sleep disturbance and dysphoric mood. The patient is not nervous/anxious.        Objective:    BP 102/62 mmHg  Pulse 77  Ht 5' 8.5" (1.74 m)  Wt 148 lb (67.132 kg)  BMI 22.17 kg/m2  SpO2 98% Physical Exam  Constitutional: She is oriented to person, place, and time. She appears well-developed and well-nourished. No distress.  HENT:  Head: Normocephalic and atraumatic.  Right Ear: External ear normal.  Left Ear: External ear normal.  Nose: Nose normal.  Mouth/Throat: Oropharynx is clear and moist. No oropharyngeal exudate.  Eyes: Conjunctivae are normal. Pupils are equal, round, and  reactive to light. Right eye exhibits no discharge. Left eye exhibits no discharge. No scleral icterus.  Neck: Normal range of motion. Neck supple. No tracheal deviation present. No thyromegaly present.  Cardiovascular: Normal rate, regular rhythm, normal heart sounds and intact distal pulses.  Exam reveals no gallop and no friction rub.   No murmur heard. Pulmonary/Chest: Effort normal and breath sounds normal. No respiratory distress. She has no wheezes. She has no rales. She exhibits no tenderness.  Musculoskeletal: Normal range of motion. She exhibits no edema or tenderness.  Lymphadenopathy:    She has no cervical adenopathy.  Neurological: She is alert and oriented to person, place, and time. No cranial nerve deficit. She exhibits normal muscle tone. Coordination normal.  Skin: Skin is warm and dry. No rash noted. She is not diaphoretic. No erythema. No pallor.  Psychiatric: She has a normal mood and affect. Her behavior is normal. Judgment and thought content normal.          Assessment & Plan:   Problem List Items Addressed This Visit      Unprioritized   Insomnia    Symptoms well controlled with prn Ambien. Will continue.      Malignant neoplasm of breast (female) [facility])    Reviewed mammogram from [place] in 11/2015. Will continue Aromasin.      Relevant Medications   LORazepam (ATIVAN) 0.5 MG tablet   butalbital-aspirin-caffeine (FIORINAL) 50-325-40 MG capsule   Migraines - Primary    Symptoms well controlled with Propranolol and prn Butalbital. Will continue.      Relevant Medications   propranolol (INDERAL) 40 MG tablet   butalbital-aspirin-caffeine (FIORINAL) 50-325-40 MG capsule   Other Relevant Orders   Comprehensive metabolic panel       Return in about 6 months (around 11/24/2016) for Physical.  Ronette Deter, MD Internal [facility]

## 2016-05-29 ENCOUNTER — Encounter: Payer: Self-pay | Admitting: Family

## 2016-05-29 ENCOUNTER — Ambulatory Visit (INDEPENDENT_AMBULATORY_CARE_PROVIDER_SITE_OTHER): Payer: BC Managed Care – PPO | Admitting: Family

## 2016-05-29 VITALS — BP 123/80 | HR 83 | Temp 98.4°F | Ht 68.5 in | Wt 147.0 lb

## 2016-05-29 DIAGNOSIS — R05 Cough: Secondary | ICD-10-CM | POA: Diagnosis not present

## 2016-05-29 DIAGNOSIS — R059 Cough, unspecified: Secondary | ICD-10-CM

## 2016-05-29 MED ORDER — AMOXICILLIN 500 MG PO CAPS: 500.0000 mg | ORAL_CAPSULE | Freq: Two times a day (BID) | ORAL | Status: DC

## 2016-05-29 NOTE — Patient Instructions (Signed)
I suspect that your infection is viral in nature.  As discussed, I advise that you wait to fill the antibiotic after 1-2 days of symptom management to see if your symptoms improve. If you do not show improvement, you may take the antibiotic as prescribed.

## 2016-05-29 NOTE — Progress Notes (Signed)
Pre visit review using our clinic review tool, if applicable. No additional management support is needed unless otherwise documented below in the visit note. 

## 2016-05-29 NOTE — Progress Notes (Addendum)
Subjective:    [name], female    DOB: 1951/12/02, 65 y.o.   MRN: [id number]   Cindy Arnold is a 65 y.o. female who presents today for an acute visit.    HPI Comments: Heading out of town this weekend and worried symptoms may worsen while she is away.  Cough This is a new problem. The current episode started in the past 7 days. The cough is productive of sputum. Associated symptoms include rhinorrhea. Pertinent negatives include no chest pain, chills, ear pain, fever, headaches, postnasal drip, sore throat or shortness of breath. The symptoms are aggravated by lying down. Treatments tried: ventolin, delsym. mucinex. There is no history of environmental allergies. reactive airway disease   Past Medical History  Diagnosis Date  . Miscarriage     x3  . 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 DES exposure in utero   . History of chicken pox   . Migraines   . Hepatitis A   . Hyperactive airway disease   . Basal cell carcinoma     multiple removed/[facility]  . Breast cancer ([place]) 12/13    s/p right lumpectomy, s/p chemo and XRT   Allergies: Review of patient's allergies indicates no known allergies. Current Outpatient Prescriptions on File Prior to Visit  Medication Sig Dispense Refill  . albuterol (PROVENTIL HFA;VENTOLIN HFA) 108 (90 BASE) MCG/ACT inhaler Inhale 2 puffs into the lungs every 6 (six) hours as needed. 3.7 g 0  . Biotin 5000 MCG TABS Take by mouth daily.    . butalbital-aspirin-caffeine (FIORINAL) 50-325-40 MG capsule Take 1 capsule by mouth 2 (two) times daily as needed for headache. 60 capsule 4  . Calcium-Magnesium-Vitamin D (CALCIUM 500 PO) Take 2 tablets by mouth daily.    Marland Kitchen exemestane (AROMASIN) 25 MG tablet Take 25 mg by mouth daily after breakfast.    . fish oil-omega-3 fatty acids 1000 MG capsule Take 1 g by mouth 2 (two) times daily.    . fluticasone (FLOVENT HFA) 110 MCG/ACT inhaler Inhale 1 puff into the lungs 2 (two) times daily  as needed. 1 Inhaler 6  . Glucos-Chond-Sterol-Fish Oil (GLUCOSAMINE CHONDROITIN PLUS PO) Take by mouth daily.    . Glucosamine-Chondroit-Vit C-Mn (GLUCOSAMINE CHONDR 1500 COMPLX) CAPS Take by mouth.    . hydrOXYzine (ATARAX/VISTARIL) 10 MG tablet TAKE ONE TABLET THREE TIMES A DAY AS NEEDED FOR ITCHING 30 tablet 0  . ipratropium (ATROVENT) 0.06 % nasal spray Place 2 sprays into the nose 3 (three) times daily. 30 mL 6  . LORazepam (ATIVAN) 0.5 MG tablet Take 1 tablet (0.5 mg total) by mouth every 8 (eight) hours as needed for anxiety. 60 tablet 3  . Melatonin 3 MG CAPS Take 1 capsule by mouth daily as needed.    . Melatonin-Tryptophan 3-100 MG CAPS Take by mouth.    . Multiple Vitamins-Minerals (MULTIVITAMIN WITH MINERALS) tablet Take 1 tablet by mouth daily.    . propranolol (INDERAL) 40 MG tablet Take 1 tablet (40 mg total) by mouth daily. 90 tablet 3  . triamcinolone cream (KENALOG) 0.1 %     . Turmeric Curcumin 500 MG CAPS Take by mouth.    . zolpidem (AMBIEN) 5 MG tablet Take 1 tablet (5 mg total) by mouth at bedtime as needed. 30 tablet 3   No current facility-administered medications on file prior to visit.    Social History  Substance Use Topics  . Smoking status: Never Smoker   . Smokeless  tobacco: Never Used  . Alcohol Use: 0.0 oz/week    0 Standard drinks or equivalent per week     Comment: 3 times a week    Review of Systems  Constitutional: Negative for fever and chills.  HENT: Positive for congestion (thin, runny) and rhinorrhea. Negative for ear pain, postnasal drip, sinus pressure and sore throat.   Respiratory: Positive for cough. Negative for shortness of breath.   Cardiovascular: Negative for chest pain.  Allergic/Immunologic: Negative for environmental allergies.  Neurological: Negative for headaches.      Objective:    BP 123/80 mmHg  Pulse 83  Temp(Src) 98.4 F (36.9 C) (Oral)  Ht 5' 8.5" (1.74 m)  Wt 147 lb (66.679 kg)  BMI 22.02 kg/m2  SpO2  96%   Physical Exam  Constitutional: She appears well-developed and well-nourished.  HENT:  Head: Normocephalic and atraumatic.  Right Ear: Hearing, tympanic membrane, external ear and ear canal normal. No drainage, swelling or tenderness. No foreign bodies. Tympanic membrane is not erythematous and not bulging. No middle ear effusion. No decreased hearing is noted.  Left Ear: Hearing, tympanic membrane, external ear and ear canal normal. No drainage, swelling or tenderness. No foreign bodies. Tympanic membrane is not erythematous and not bulging.  No middle ear effusion. No decreased hearing is noted.  Nose: Rhinorrhea present. Right sinus exhibits no maxillary sinus tenderness and no frontal sinus tenderness. Left sinus exhibits no maxillary sinus tenderness and no frontal sinus tenderness.  Mouth/Throat: Uvula is midline, oropharynx is clear and moist and mucous membranes are normal. No oropharyngeal exudate, posterior oropharyngeal edema, posterior oropharyngeal erythema or tonsillar abscesses.  Eyes: Conjunctivae are normal.  Cardiovascular: Regular rhythm, normal heart sounds and normal pulses.   Pulmonary/Chest: Effort normal and breath sounds normal. She has no wheezes. She has no rhonchi. She has no rales.  Lymphadenopathy:       Head (right side): No submental, no submandibular, no tonsillar, no preauricular, no posterior auricular and no occipital adenopathy present.       Head (left side): No submental, no submandibular, no tonsillar, no preauricular, no posterior auricular and no occipital adenopathy present.    She has no cervical adenopathy.  Neurological: She is alert.  Skin: Skin is warm and dry.  Psychiatric: She has a normal mood and affect. Her speech is normal and behavior is normal. Thought content normal.  Vitals reviewed.      Assessment & Plan:   1. Cough Suspect viral URI. Afebrile.Patient and I agreed on conservative management for the next day or 2 and if  symptoms worsen, she may fill the amoxicillin.  - amoxicillin (AMOXIL) 500 MG capsule; Take 1 capsule (500 mg total) by mouth 2 (two) times daily.  Dispense: 14 capsule; Refill: 0    I am having [name] start on amoxicillin. I am also having her maintain her Melatonin, multivitamin with minerals, fish oil-omega-3 fatty acids, Calcium-Magnesium-Vitamin D (CALCIUM 500 PO), fluticasone, exemestane, Biotin, Glucos-Chond-Sterol-Fish Oil (GLUCOSAMINE CHONDROITIN PLUS PO), albuterol, hydrOXYzine, Turmeric Curcumin, triamcinolone cream, Melatonin-Tryptophan, GLUCOSAMINE CHONDR 1500 COMPLX, propranolol, LORazepam, ipratropium, butalbital-aspirin-caffeine, and zolpidem.   Meds ordered this encounter  Medications  . amoxicillin (AMOXIL) 500 MG capsule    Sig: Take 1 capsule (500 mg total) by mouth 2 (two) times daily.    Dispense:  14 capsule    Refill:  0    Order Specific Question:  Supervising Provider    Answer:  Cassandria Anger [[id number]]     Start medications as  prescribed and explained to patient on After Visit Summary ( AVS). Risks, benefits, and alternatives of the medications and treatment plan prescribed today were discussed, and patient expressed understanding.   Education regarding symptom management and diagnosis given to patient.   Follow-up:Plan follow-up and return precautions given if any worsening symptoms or change in condition.   Continue to follow with Rica Mast, MD for routine health maintenance.   Cindy Arnold and I agreed with plan.   Mable Paris, FNP

## 2016-06-05 ENCOUNTER — Encounter: Payer: Self-pay | Admitting: Family

## 2016-06-05 ENCOUNTER — Ambulatory Visit (INDEPENDENT_AMBULATORY_CARE_PROVIDER_SITE_OTHER): Payer: BC Managed Care – PPO | Admitting: Family

## 2016-06-05 VITALS — BP 100/68 | HR 69 | Temp 98.2°F | Ht 68.5 in | Wt 145.6 lb

## 2016-06-05 DIAGNOSIS — R062 Wheezing: Secondary | ICD-10-CM | POA: Diagnosis not present

## 2016-06-05 DIAGNOSIS — R05 Cough: Secondary | ICD-10-CM

## 2016-06-05 DIAGNOSIS — R059 Cough, unspecified: Secondary | ICD-10-CM

## 2016-06-05 MED ORDER — PREDNISONE 10 MG PO TABS
ORAL_TABLET | ORAL | Status: DC
Start: 2016-06-05 — End: 2016-11-29

## 2016-06-05 MED ORDER — ALBUTEROL SULFATE HFA 108 (90 BASE) MCG/ACT IN AERS: 2.0000 | INHALATION_SPRAY | Freq: Four times a day (QID) | RESPIRATORY_TRACT | Status: DC | PRN

## 2016-06-05 MED ORDER — HYDROCODONE-HOMATROPINE 5-1.5 MG/5ML PO SYRP: 5.0000 mL | ORAL_SOLUTION | Freq: Every evening | ORAL | Status: DC | PRN

## 2016-06-05 MED ORDER — AZITHROMYCIN 250 MG PO TABS: ORAL_TABLET | ORAL | Status: DC

## 2016-06-05 NOTE — Progress Notes (Signed)
Subjective:    [name], female    DOB: 1951/04/13, 65 y.o.   MRN: [id number]   Cindy Arnold is a 65 y.o. female who presents today for an acute visit.    HPI Comments: Patient here revaluation of cough, wheezing, worsening. Has been taking amoxicillin is not better.She has also been taking her Ventolin inhaler, Delsym, and Mucinex without relief. H/o reactive airway disease. Low grade fever,chills.  Past Medical History  Diagnosis Date  . Miscarriage     x3  . 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)   . DES exposure in utero   . History of chicken pox   . Migraines   . Hepatitis A   . Hyperactive airway disease   . Basal cell carcinoma     multiple removed/[facility]  . Breast cancer ([place]) 12/13    s/p right lumpectomy, s/p chemo and XRT   Allergies: Review of patient's allergies indicates no known allergies. Current Outpatient Prescriptions on File Prior to Visit  Medication Sig Dispense Refill  . albuterol (PROVENTIL HFA;VENTOLIN HFA) 108 (90 BASE) MCG/ACT inhaler Inhale 2 puffs into the lungs every 6 (six) hours as needed. 3.7 g 0  . amoxicillin (AMOXIL) 500 MG capsule Take 1 capsule (500 mg total) by mouth 2 (two) times daily. 14 capsule 0  . Biotin 5000 MCG TABS Take by mouth daily.    . butalbital-aspirin-caffeine (FIORINAL) 50-325-40 MG capsule Take 1 capsule by mouth 2 (two) times daily as needed for headache. 60 capsule 4  . Calcium-Magnesium-Vitamin D (CALCIUM 500 PO) Take 2 tablets by mouth daily.    Marland Kitchen exemestane (AROMASIN) 25 MG tablet Take 25 mg by mouth daily after breakfast.    . fish oil-omega-3 fatty acids 1000 MG capsule Take 1 g by mouth 2 (two) times daily.    . fluticasone (FLOVENT HFA) 110 MCG/ACT inhaler Inhale 1 puff into the lungs 2 (two) times daily as needed. 1 Inhaler 6  . Glucos-Chond-Sterol-Fish Oil (GLUCOSAMINE CHONDROITIN PLUS PO) Take by mouth daily.    . Glucosamine-Chondroit-Vit C-Mn (GLUCOSAMINE CHONDR 1500 COMPLX) CAPS  Take by mouth.    . hydrOXYzine (ATARAX/VISTARIL) 10 MG tablet TAKE ONE TABLET THREE TIMES A DAY AS NEEDED FOR ITCHING 30 tablet 0  . ipratropium (ATROVENT) 0.06 % nasal spray Place 2 sprays into the nose 3 (three) times daily. 30 mL 6  . LORazepam (ATIVAN) 0.5 MG tablet Take 1 tablet (0.5 mg total) by mouth every 8 (eight) hours as needed for anxiety. 60 tablet 3  . Melatonin 3 MG CAPS Take 1 capsule by mouth daily as needed.    . Melatonin-Tryptophan 3-100 MG CAPS Take by mouth.    . Multiple Vitamins-Minerals (MULTIVITAMIN WITH MINERALS) tablet Take 1 tablet by mouth daily.    . propranolol (INDERAL) 40 MG tablet Take 1 tablet (40 mg total) by mouth daily. 90 tablet 3  . triamcinolone cream (KENALOG) 0.1 %     . Turmeric Curcumin 500 MG CAPS Take by mouth.    . zolpidem (AMBIEN) 5 MG tablet Take 1 tablet (5 mg total) by mouth at bedtime as needed. 30 tablet 3   No current facility-administered medications on file prior to visit.    Social History  Substance Use Topics  . Smoking status: Never Smoker   . Smokeless tobacco: Never Used  . Alcohol Use: 0.0 oz/week    0 Standard drinks or equivalent per week     Comment: 3 times a week  Review of Systems  Constitutional: Positive for fever and chills.  HENT: Positive for congestion and rhinorrhea. Negative for sinus pressure and sore throat.   Eyes: Eye itching: low grade , 99.  Respiratory: Negative for cough.   Cardiovascular: Negative for chest pain and palpitations.  Gastrointestinal: Negative for nausea and vomiting.      Objective:    BP 100/68 mmHg  Pulse 69  Temp(Src) 98.2 F (36.8 C) (Oral)  Ht 5' 8.5" (1.74 m)  Wt 145 lb 9.6 oz (66.044 kg)  BMI 21.81 kg/m2  SpO2 97%   Physical Exam  Constitutional: She appears well-developed and well-nourished.  HENT:  Head: Normocephalic and atraumatic.  Right Ear: Hearing, tympanic membrane, external ear and ear canal normal. No drainage, swelling or tenderness. No foreign  bodies. Tympanic membrane is not erythematous and not bulging. No middle ear effusion. No decreased hearing is noted.  Left Ear: Hearing, tympanic membrane, external ear and ear canal normal. No drainage, swelling or tenderness. No foreign bodies. Tympanic membrane is not erythematous and not bulging.  No middle ear effusion. No decreased hearing is noted.  Nose: Rhinorrhea present. Right sinus exhibits no maxillary sinus tenderness and no frontal sinus tenderness. Left sinus exhibits no maxillary sinus tenderness and no frontal sinus tenderness.  Mouth/Throat: Uvula is midline, oropharynx is clear and moist and mucous membranes are normal. No oropharyngeal exudate, posterior oropharyngeal edema, posterior oropharyngeal erythema or tonsillar abscesses.  Eyes: Conjunctivae are normal.  Cardiovascular: Regular rhythm, normal heart sounds and normal pulses.   Pulmonary/Chest: Effort normal and breath sounds normal. She has no wheezes. She has no rhonchi. She has no rales.  Lymphadenopathy:       Head (right side): No submental, no submandibular, no tonsillar, no preauricular, no posterior auricular and no occipital adenopathy present.       Head (left side): No submental, no submandibular, no tonsillar, no preauricular, no posterior auricular and no occipital adenopathy present.    She has no cervical adenopathy.  Neurological: She is alert.  Skin: Skin is warm and dry.  Psychiatric: She has a normal mood and affect. Her speech is normal and behavior is normal. Thought content normal.  Vitals reviewed.      Assessment & Plan:   1. Cough Due to duration of symptoms and failure for amoxicillin to respond,patient and I agreed to start new antibiotic. She will discontinue amoxicillin.  - azithromycin (ZITHROMAX) 250 MG tablet; Take 2 tablets ( total 500 mg) PO on day 1, then take 1 tablet ( total 250 mg) by mouth q24h x 4 days.  Dispense: 6 tablet; Refill: 0 - HYDROcodone-homatropine (HYCODAN) 5-1.5  MG/5ML syrup; Take 5 mLs by mouth at bedtime as needed for cough.  Dispense: 40 mL; Refill: 0  2. Wheezing No acute respiratory distress.   - albuterol (PROVENTIL HFA;VENTOLIN HFA) 108 (90 Base) MCG/ACT inhaler; Inhale 2 puffs into the lungs every 6 (six) hours as needed.  Dispense: 3.7 g; Refill: 0 - predniSONE (DELTASONE) 10 MG tablet; Take 4 tablets ( total 40 mg) by mouth for 2 days; take 3 tablets ( total 30 mg) by mouth for 2 days; take 2 tablets ( total 20 mg) by mouth for 1 day; take 1 tablet ( total 10 mg) by mouth for 1 day.  Dispense: 17 tablet; Refill: 0    I am having Cindy Arnold maintain her Melatonin, multivitamin with minerals, fish oil-omega-3 fatty acids, Calcium-Magnesium-Vitamin D (CALCIUM 500 PO), fluticasone, exemestane, Biotin, Glucos-Chond-Sterol-Fish Oil (GLUCOSAMINE CHONDROITIN  PLUS PO), albuterol, hydrOXYzine, Turmeric Curcumin, triamcinolone cream, Melatonin-Tryptophan, GLUCOSAMINE CHONDR 1500 COMPLX, propranolol, LORazepam, ipratropium, butalbital-aspirin-caffeine, zolpidem, and amoxicillin.   No orders of the defined types were placed in this encounter.     Start medications as prescribed and explained to patient on After Visit Summary ( AVS). Risks, benefits, and alternatives of the medications and treatment plan prescribed today were discussed, and patient expressed understanding.   Education regarding symptom management and diagnosis given to patient.   Follow-up:Plan follow-up and return precautions given if any worsening symptoms or change in condition.   Continue to follow with Rica Mast, MD for routine health maintenance.   Cindy Arnold and I agreed with plan.   Mable Paris, FNP

## 2016-06-05 NOTE — Patient Instructions (Signed)
Please take cough medication at night only as needed. As we discussed, I do not recommend dosing throughout the day as coughing is a protective mechanism . It also helps to break up thick mucous.  Do not take cough suppressants with alcohol as can lead to trouble breathing. Advise caution if taking cough suppressant and operating machinery ( i.e driving a car) as you may feel very tired.    Increase intake of clear fluids. Congestion is best treated by hydration, when mucus is wetter, it is thinner, less sticky, and easier to expel from the body, either through coughing up drainage, or by blowing your nose.   Get plenty of rest.   Use saline nasal drops and blow your nose frequently. Run a humidifier at night and elevate the head of the bed. Vicks Vapor rub will help with congestion and cough. Steam showers and sinus massage for congestion.   Use Acetaminophen or Ibuprofen as needed for fever or pain. Avoid second hand smoke. Even the smallest exposure will worsen symptoms.   Over the counter medications you can try include Delsym for cough, a decongestant for congestion, and Mucinex or Robitussin as an expectorant. Be sure to just get the plain Mucinex or Robitussin that just has one medication (Guaifenesen). We don't recommend the combination products. Note, be sure to drink two glasses of water with each dose of Mucinex as the medication will not work well without adequate hydration.   You can also try a teaspoon of honey to see if this will help reduce cough. Throat lozenges can sometimes be beneficial as well.    This illness will typically last 7 - 10 days.   Please follow up with our clinic if you develop a fever greater than 101 F, symptoms worsen, or do not resolve in the next week.    

## 2016-11-26 ENCOUNTER — Encounter: Payer: BC Managed Care – PPO | Admitting: Internal Medicine

## 2016-11-29 ENCOUNTER — Ambulatory Visit (INDEPENDENT_AMBULATORY_CARE_PROVIDER_SITE_OTHER): Payer: BC Managed Care – PPO | Admitting: Internal Medicine

## 2016-11-29 ENCOUNTER — Encounter: Payer: Self-pay | Admitting: Internal Medicine

## 2016-11-29 DIAGNOSIS — M8580 Other specified disorders of bone density and structure, unspecified site: Secondary | ICD-10-CM | POA: Diagnosis not present

## 2016-11-29 DIAGNOSIS — G4700 Insomnia, unspecified: Secondary | ICD-10-CM | POA: Diagnosis not present

## 2016-11-29 DIAGNOSIS — Z170 Estrogen receptor positive status [ER+]: Secondary | ICD-10-CM

## 2016-11-29 DIAGNOSIS — C50919 Malignant neoplasm of unspecified site of unspecified female breast: Secondary | ICD-10-CM | POA: Diagnosis not present

## 2016-11-29 DIAGNOSIS — F411 Generalized anxiety disorder: Secondary | ICD-10-CM

## 2016-11-29 DIAGNOSIS — J4520 Mild intermittent asthma, uncomplicated: Secondary | ICD-10-CM

## 2016-11-29 DIAGNOSIS — G43809 Other migraine, not intractable, without status migrainosus: Secondary | ICD-10-CM

## 2016-11-29 MED ORDER — ZOLPIDEM TARTRATE 5 MG PO TABS: 5.0000 mg | ORAL_TABLET | Freq: Every evening | ORAL | 1 refills | Status: DC | PRN

## 2016-11-29 MED ORDER — BUTALBITAL-ASA-CAFFEINE 50-325-40 MG PO CAPS: 1.0000 | ORAL_CAPSULE | Freq: Two times a day (BID) | ORAL | 1 refills | Status: DC | PRN

## 2016-11-29 MED ORDER — LORAZEPAM 0.5 MG PO TABS: 0.5000 mg | ORAL_TABLET | Freq: Every day | ORAL | 1 refills | Status: DC | PRN

## 2016-11-29 MED ORDER — HYDROXYZINE HCL 10 MG PO TABS: ORAL_TABLET | ORAL | 0 refills | Status: DC

## 2016-11-29 NOTE — Progress Notes (Signed)
[name], female   DOB: [birth year], 65 y.o.   MRN: [id number]   Subjective:    [name], female    DOB: 1951/11/02, 65 y.o.   MRN: [id number]  HPI  Patient here to establish care.  Former pt of Dr Gilford Rile.  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  S/p lumpectomy.  Received XRT and adjuvant chemotherapy.  On aromasin.  Followed by oncology.  Doing well.  Due f/u in 12/2016.  Also has a history of lupus anticoagulant and osteopenia.  She has a history of colon polyps.  S/p polypectomy and last colonoscopy in 2013.  She is up to day with her physicals. Last physical in 11/2015.  She stays active.  No chest pain.  No sob.  No acid reflux.  No abdominal pain or cramping.  Bowels stable.  Works at [employer].  She is a Control and instrumentation engineer.     Past Medical History:  Diagnosis Date  . Basal cell carcinoma    multiple removed/[facility]  . Breast cancer ([place]) 12/13   s/p right lumpectomy, s/p chemo and XRT  . DES exposure in utero   . Hepatitis A   . History of chicken pox   . Hyperactive airway disease   . 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 Migraines   . Miscarriage    x3   Past Surgical History:  Procedure Laterality Date  . BASAL CELL CARCINOMA EXCISION     Multiple  . BREAST BIOPSY  1975   Cyst  . BREAST LUMPECTOMY WITH AXILLARY LYMPH NODE DISSECTION  01/06/13, 01/22/13   Dr. Drue Dun, [facility], second surgery for pos margin  . CERVICAL FUSION  2001  . DILATION AND CURETTAGE OF UTERUS     x 3  . VAGINAL DELIVERY     x4   Family History  Problem Relation Age of Onset  . Pancreatic cancer Mother   . Cancer Mother     Pancreatic  . Arthritis Mother   . Hyperlipidemia Mother   . Hypertension Mother   . Heart attack Father   . Hyperlipidemia Father   . Hypertension Father   . Heart disease Father 42  . Colon cancer Other   . Cancer Other 50    Breast and Ovarian- dx 50's/Colon  . Birth defects  Maternal Aunt     Breast - in 36's  . Stroke Maternal Grandmother   . Birth defects Maternal Aunt     Breast - in 64's   Social History   Social History  . Marital status: Married    Spouse name: N/A  . Number of children: 4  . Years of education: N/A   Occupational History  . Corporate treasurer - [employer]    Social History Main Topics  . Smoking status: Never Smoker  . Smokeless tobacco: Never Used  . Alcohol use 0.0 oz/week     Comment: 3 times a week  . Drug use: No  . Sexual activity: Not Asked   Other Topics Concern  . None   Social History Narrative   Lives in [place] with husband.  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     Outpatient Encounter Prescriptions as of 11/29/2016  Medication Sig  . Biotin 5000 MCG TABS Take by mouth daily.  . butalbital-aspirin-caffeine (FIORINAL) 50-325-40 MG capsule Take 1 capsule by mouth 2 (two) times daily as needed for headache.  . Calcium-Magnesium-Vitamin D (CALCIUM  500 PO) Take 2 tablets by mouth daily.  Marland Kitchen exemestane (AROMASIN) 25 MG tablet Take 25 mg by mouth daily after breakfast.  . fish oil-omega-3 fatty acids 1000 MG capsule Take 1 g by mouth 2 (two) times daily.  . Glucos-Chond-Sterol-Fish Oil (GLUCOSAMINE CHONDROITIN PLUS PO) Take by mouth daily.  . Glucosamine-Chondroit-Vit C-Mn (GLUCOSAMINE CHONDR 1500 COMPLX) CAPS Take by mouth.  . hydrOXYzine (ATARAX/VISTARIL) 10 MG tablet TAKE ONE TABLET THREE TIMES A DAY AS NEEDED FOR ITCHING  . ipratropium (ATROVENT) 0.06 % nasal spray Place 2 sprays into the nose 3 (three) times daily.  Marland Kitchen LORazepam (ATIVAN) 0.5 MG tablet Take 1 tablet (0.5 mg total) by mouth daily as needed for anxiety.  . Multiple Vitamins-Minerals (MULTIVITAMIN WITH MINERALS) tablet Take 1 tablet by mouth daily.  . propranolol (INDERAL) 40 MG tablet Take 1 tablet (40 mg total) by mouth daily.  Marland Kitchen triamcinolone cream (KENALOG) 0.1 %   . Turmeric Curcumin 500 MG CAPS Take by mouth.  . zolpidem (AMBIEN) 5 MG tablet Take 1  tablet (5 mg total) by mouth at bedtime as needed.  . [DISCONTINUED] albuterol (PROVENTIL HFA;VENTOLIN HFA) 108 (90 Base) MCG/ACT inhaler Inhale 2 puffs into the lungs every 6 (six) hours as needed.  . [DISCONTINUED] amoxicillin (AMOXIL) 500 MG capsule Take 1 capsule (500 mg total) by mouth 2 (two) times daily.  . [DISCONTINUED] azithromycin (ZITHROMAX) 250 MG tablet Take 2 tablets ( total 500 mg) PO on day 1, then take 1 tablet ( total 250 mg) by mouth q24h x 4 days.  . [DISCONTINUED] butalbital-aspirin-caffeine (FIORINAL) 50-325-40 MG capsule Take 1 capsule by mouth 2 (two) times daily as needed for headache.  . [DISCONTINUED] fluticasone (FLOVENT HFA) 110 MCG/ACT inhaler Inhale 1 puff into the lungs 2 (two) times daily as needed.  . [DISCONTINUED] HYDROcodone-homatropine (HYCODAN) 5-1.5 MG/5ML syrup Take 5 mLs by mouth at bedtime as needed for cough.  . [DISCONTINUED] hydrOXYzine (ATARAX/VISTARIL) 10 MG tablet TAKE ONE TABLET THREE TIMES A DAY AS NEEDED FOR ITCHING  . [DISCONTINUED] LORazepam (ATIVAN) 0.5 MG tablet Take 1 tablet (0.5 mg total) by mouth every 8 (eight) hours as needed for anxiety.  . [DISCONTINUED] Melatonin 3 MG CAPS Take 1 capsule by mouth daily as needed.  . [DISCONTINUED] Melatonin-Tryptophan 3-100 MG CAPS Take by mouth.  . [DISCONTINUED] predniSONE (DELTASONE) 10 MG tablet Take 4 tablets ( total 40 mg) by mouth for 2 days; take 3 tablets ( total 30 mg) by mouth for 2 days; take 2 tablets ( total 20 mg) by mouth for 1 day; take 1 tablet ( total 10 mg) by mouth for 1 day.  . [DISCONTINUED] zolpidem (AMBIEN) 5 MG tablet Take 1 tablet (5 mg total) by mouth at bedtime as needed.   No facility-administered encounter medications on file as of 11/29/2016.     Review of Systems  Constitutional: Negative for appetite change and unexpected weight change.  HENT: Negative for congestion and sinus pressure.   Respiratory: Negative for cough, chest tightness and shortness of breath.     Cardiovascular: Negative for chest pain, palpitations and leg swelling.  Gastrointestinal: Negative for abdominal pain, diarrhea, nausea and vomiting.  Genitourinary: Negative for difficulty urinating and dysuria.  Musculoskeletal: Negative for back pain and joint swelling.  Skin: Negative for color change and rash.  Neurological: Negative for dizziness, light-headedness and headaches.  Psychiatric/Behavioral: Negative for agitation and dysphoric mood.       Objective:    Physical Exam  Constitutional: She appears well-developed  and well-nourished. No distress.  HENT:  Nose: Nose normal.  Mouth/Throat: Oropharynx is clear and moist.  Neck: Neck supple. No thyromegaly present.  Cardiovascular: Normal rate and regular rhythm.   Pulmonary/Chest: Breath sounds normal. No respiratory distress. She has no wheezes.  Abdominal: Soft. Bowel sounds are normal. There is no tenderness.  Musculoskeletal: She exhibits no edema or tenderness.  Lymphadenopathy:    She has no cervical adenopathy.  Skin: No rash noted. No erythema.  Psychiatric: She has a normal mood and affect. Her behavior is normal.    BP 110/78   Pulse 62   Temp 98.3 F (36.8 C) (Oral)   Ht 5\' 8"  (1.727 m)   Wt 142 lb 6.4 oz (64.6 kg)   SpO2 98%   BMI 21.65 kg/m  Wt Readings from Last 3 Encounters:  11/29/16 142 lb 6.4 oz (64.6 kg)  06/05/16 145 lb 9.6 oz (66 kg)  05/29/16 147 lb (66.7 kg)     Lab Results  Component Value Date   WBC 5.1 11/14/2015   HGB 13.2 11/14/2015   HCT 39.7 11/14/2015   PLT 258.0 11/14/2015   GLUCOSE 82 05/25/2016   CHOL 193 11/14/2015   TRIG 108.0 11/14/2015   HDL 74.20 11/14/2015   LDLCALC 97 11/14/2015   ALT 11 05/25/2016   AST 16 05/25/2016   NA 138 05/25/2016   K 4.0 05/25/2016   CL 101 05/25/2016   CREATININE 0.83 05/25/2016   BUN 11 05/25/2016   CO2 29 05/25/2016   TSH 2.04 11/14/2015   MICROALBUR <0.7 11/14/2015    Dg Chest 2 View  Result Date:  11/16/2015 CLINICAL DATA:  Nonproductive cough for several weeks. EXAM: CHEST  2 VIEW COMPARISON:  November 09, 2013. FINDINGS: The heart size and mediastinal contours are within normal limits. Both lungs are clear. No pneumothorax or pleural effusion is noted. Multilevel degenerative disc disease is noted in mid thoracic spine. IMPRESSION: No active cardiopulmonary disease. Electronically Signed   By: Marijo Conception, M.D.   On: 11/16/2015 14:48       Assessment & Plan:   Problem List Items Addressed This Visit    Anxiety state    Stable.  Takes prn lorazepam.        Relevant Medications   hydrOXYzine (ATARAX/VISTARIL) 10 MG tablet   LORazepam (ATIVAN) 0.5 MG tablet   Asthma    Breathing stable.        Insomnia    Controlled with prn ambien.        Malignant neoplasm of breast (female) [facility])    Last mammogram 11/2015.  On aromasin.  Followed by oncology.        Relevant Medications   LORazepam (ATIVAN) 0.5 MG tablet   Migraines    Controlled on propranolol.        Relevant Medications   butalbital-aspirin-caffeine (FIORINAL) 50-325-40 MG capsule   Osteopenia    Completed prolia injection in 2016.            Einar Pheasant, MD

## 2016-11-29 NOTE — Progress Notes (Signed)
Pre visit review using our clinic review tool, if applicable. No additional management support is needed unless otherwise documented below in the visit note. 

## 2016-12-01 ENCOUNTER — Encounter: Payer: Self-pay | Admitting: Internal Medicine

## 2016-12-01 NOTE — Assessment & Plan Note (Signed)
Controlled on propranolol.   

## 2016-12-01 NOTE — Assessment & Plan Note (Signed)
Breathing stable.

## 2016-12-01 NOTE — Assessment & Plan Note (Signed)
Stable.  Takes prn lorazepam.

## 2016-12-01 NOTE — Assessment & Plan Note (Signed)
Completed prolia injection in 2016.

## 2016-12-01 NOTE — Assessment & Plan Note (Signed)
Controlled with prn ambien.

## 2016-12-01 NOTE — Assessment & Plan Note (Signed)
Last mammogram 11/2015.  On aromasin.  Followed by oncology.

## 2017-01-29 ENCOUNTER — Encounter: Payer: BC Managed Care – PPO | Admitting: Internal Medicine

## 2017-01-31 ENCOUNTER — Encounter: Payer: Self-pay | Admitting: Internal Medicine

## 2017-01-31 ENCOUNTER — Ambulatory Visit (INDEPENDENT_AMBULATORY_CARE_PROVIDER_SITE_OTHER): Payer: BC Managed Care – PPO | Admitting: Internal Medicine

## 2017-01-31 VITALS — BP 110/64 | HR 70 | Temp 98.6°F | Resp 18 | Ht 68.0 in | Wt 140.8 lb

## 2017-01-31 DIAGNOSIS — F411 Generalized anxiety disorder: Secondary | ICD-10-CM | POA: Diagnosis not present

## 2017-01-31 DIAGNOSIS — Z170 Estrogen receptor positive status [ER+]: Secondary | ICD-10-CM | POA: Diagnosis not present

## 2017-01-31 DIAGNOSIS — Z0000 Encounter for general adult medical examination without abnormal findings: Secondary | ICD-10-CM

## 2017-01-31 DIAGNOSIS — C50919 Malignant neoplasm of unspecified site of unspecified female breast: Secondary | ICD-10-CM

## 2017-01-31 MED ORDER — IPRATROPIUM BROMIDE 0.06 % NA SOLN: 2.0000 | Freq: Three times a day (TID) | NASAL | 6 refills | Status: DC

## 2017-01-31 MED ORDER — PROPRANOLOL HCL 40 MG PO TABS: 40.0000 mg | ORAL_TABLET | Freq: Every day | ORAL | 3 refills | Status: DC

## 2017-01-31 NOTE — Progress Notes (Signed)
[name], female   DOB: [birth year], 66 y.o.   MRN: [id number]   Subjective:    [name], female    DOB: 1951/04/01, 66 y.o.   MRN: [id number]  HPI  Patient here for a physical exam.  She reports she is doing well.  Stays active.  No chest pain.  No sob.  No acid reflux.  No abdominal pain or cramping.  Bowels stable.  Taking aromasin.  Sees Dr Dina Rich.  Has f/u planned in 05/2017.  Has been watching her diet.  Has lost some weight.  Feels good.     Past Medical History:  Diagnosis Date  . Basal cell carcinoma    multiple removed/[facility]  . Breast cancer ([place]) 12/13   s/p right lumpectomy, s/p chemo and XRT  . DES exposure in utero   . Hepatitis A   . History of chicken pox   . Hyperactive airway disease   . 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[place])   . Migraines   . Miscarriage    x3   Past Surgical History:  Procedure Laterality Date  . BASAL CELL CARCINOMA EXCISION     Multiple  . BREAST BIOPSY  1975   Cyst  . BREAST LUMPECTOMY WITH AXILLARY LYMPH NODE DISSECTION  01/06/13, 01/22/13   Dr. Drue Dun, [facility], second surgery for pos margin  . CERVICAL FUSION  2001  . DILATION AND CURETTAGE OF UTERUS     x 3  . VAGINAL DELIVERY     x4   Family History  Problem Relation Age of Onset  . Pancreatic cancer Mother   . Cancer Mother     Pancreatic  . Arthritis Mother   . Hyperlipidemia Mother   . Hypertension Mother   . Heart attack Father   . Hyperlipidemia Father   . Hypertension Father   . Heart disease Father 65  . Colon cancer Other   . Cancer Other 50    Breast and Ovarian- dx 50's/Colon  . Birth defects Maternal Aunt     Breast - in 10's  . Stroke Maternal Grandmother   . Birth defects Maternal Aunt     Breast - in 76's   Social History   Social History  . Marital status: Married    Spouse name: N/A  . Number of children: 4  . Years of education: N/A   Occupational History  . Corporate treasurer - [place]     Social History Main Topics  . Smoking status: Never Smoker  . Smokeless tobacco: Never Used  . Alcohol use 0.0 oz/week     Comment: 3 times a week  . Drug use: No  . Sexual activity: Not Asked   Other Topics Concern  . None   Social History Narrative   Lives in [place] with husband.  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     Outpatient Encounter Prescriptions as of 01/31/2017  Medication Sig  . Biotin 5000 MCG TABS Take by mouth daily.  . butalbital-aspirin-caffeine (FIORINAL) 50-325-40 MG capsule Take 1 capsule by mouth 2 (two) times daily as needed for headache.  . Calcium-Magnesium-Vitamin D (CALCIUM 500 PO) Take 2 tablets by mouth daily.  Marland Kitchen exemestane (AROMASIN) 25 MG tablet Take 25 mg by mouth daily after breakfast.  . fish oil-omega-3 fatty acids 1000 MG capsule Take 1 g by mouth 2 (two) times daily.  . Glucos-Chond-Sterol-Fish Oil (GLUCOSAMINE CHONDROITIN PLUS PO) Take by mouth daily.  . Glucosamine-Chondroit-Vit C-Mn (GLUCOSAMINE CHONDR 1500  COMPLX) CAPS Take by mouth.  . hydrOXYzine (ATARAX/VISTARIL) 10 MG tablet TAKE ONE TABLET THREE TIMES A DAY AS NEEDED FOR ITCHING  . ipratropium (ATROVENT) 0.06 % nasal spray Place 2 sprays into the nose 3 (three) times daily.  Marland Kitchen LORazepam (ATIVAN) 0.5 MG tablet Take 1 tablet (0.5 mg total) by mouth daily as needed for anxiety.  . Multiple Vitamins-Minerals (MULTIVITAMIN WITH MINERALS) tablet Take 1 tablet by mouth daily.  . propranolol (INDERAL) 40 MG tablet Take 1 tablet (40 mg total) by mouth daily.  Marland Kitchen triamcinolone cream (KENALOG) 0.1 %   . Turmeric Curcumin 500 MG CAPS Take by mouth.  . zolpidem (AMBIEN) 5 MG tablet Take 1 tablet (5 mg total) by mouth at bedtime as needed.  . [DISCONTINUED] ipratropium (ATROVENT) 0.06 % nasal spray Place 2 sprays into the nose 3 (three) times daily.  . [DISCONTINUED] propranolol (INDERAL) 40 MG tablet Take 1 tablet (40 mg total) by mouth daily.   No facility-administered encounter medications on file as of  01/31/2017.     Review of Systems  Constitutional: Negative for appetite change and unexpected weight change.  HENT: Negative for congestion and sinus pressure.   Eyes: Negative for pain and visual disturbance.  Respiratory: Negative for cough, chest tightness and shortness of breath.   Cardiovascular: Negative for chest pain, palpitations and leg swelling.  Gastrointestinal: Negative for abdominal pain, diarrhea, nausea and vomiting.  Genitourinary: Negative for difficulty urinating and dysuria.  Musculoskeletal: Negative for back pain and joint swelling.  Skin: Negative for color change and rash.  Neurological: Negative for dizziness, light-headedness and headaches.  Hematological: Negative for adenopathy. Does not bruise/bleed easily.  Psychiatric/Behavioral: Negative for agitation and dysphoric mood.       Objective:    Physical Exam  Constitutional: She is oriented to person, place, and time. She appears well-developed and well-nourished. No distress.  HENT:  Nose: Nose normal.  Mouth/Throat: Oropharynx is clear and moist.  Eyes: Right eye exhibits no discharge. Left eye exhibits no discharge. No scleral icterus.  Neck: Neck supple. No thyromegaly present.  Cardiovascular: Normal rate and regular rhythm.   Pulmonary/Chest: Breath sounds normal. No accessory muscle usage. No tachypnea. No respiratory distress. She has no decreased breath sounds. She has no wheezes. She has no rhonchi. Right breast exhibits no inverted nipple, no mass, no nipple discharge and no tenderness (no axillary adenopathy). Left breast exhibits no inverted nipple, no mass, no nipple discharge and no tenderness (no axilarry adenopathy).  Abdominal: Soft. Bowel sounds are normal. There is no tenderness.  Musculoskeletal: She exhibits no edema or tenderness.  Lymphadenopathy:    She has no cervical adenopathy.  Neurological: She is alert and oriented to person, place, and time.  Skin: Skin is warm. No rash  noted. No erythema.  Psychiatric: She has a normal mood and affect. Her behavior is normal.    BP 110/64 (BP Location: Left Arm, Patient Position: Sitting, Cuff Size: Large)   Pulse 70   Temp 98.6 F (37 C) (Oral)   Resp 18   Ht 5\' 8"  (1.727 m)   Wt 140 lb 12.8 oz (63.9 kg)   SpO2 99%   BMI 21.41 kg/m  Wt Readings from Last 3 Encounters:  01/31/17 140 lb 12.8 oz (63.9 kg)  11/29/16 142 lb 6.4 oz (64.6 kg)  06/05/16 145 lb 9.6 oz (66 kg)     Lab Results  Component Value Date   WBC 5.1 11/14/2015   HGB 13.2 11/14/2015  HCT 39.7 11/14/2015   PLT 258.0 11/14/2015   GLUCOSE 82 05/25/2016   CHOL 193 11/14/2015   TRIG 108.0 11/14/2015   HDL 74.20 11/14/2015   LDLCALC 97 11/14/2015   ALT 11 05/25/2016   AST 16 05/25/2016   NA 138 05/25/2016   K 4.0 05/25/2016   CL 101 05/25/2016   CREATININE 0.83 05/25/2016   BUN 11 05/25/2016   CO2 29 05/25/2016   TSH 2.04 11/14/2015   MICROALBUR <0.7 11/14/2015    Dg Chest 2 View  Result Date: 11/16/2015 CLINICAL DATA:  Nonproductive cough for several weeks. EXAM: CHEST  2 VIEW COMPARISON:  November 09, 2013. FINDINGS: The heart size and mediastinal contours are within normal limits. Both lungs are clear. No pneumothorax or pleural effusion is noted. Multilevel degenerative disc disease is noted in mid thoracic spine. IMPRESSION: No active cardiopulmonary disease. Electronically Signed   By: Marijo Conception, M.D.   On: 11/16/2015 14:48       Assessment & Plan:   Problem List Items Addressed This Visit    Anxiety state    Stable.  Takes lorazepam.        Healthcare maintenance    Physical today 01/31/17.  PAP negative with negative HPV.  Atrophy changes present.  Mammogram 01/02/17 - Birads II.  Colonoscopy 02/06/12 - internal hemorrhoids.        Malignant neoplasm of breast (female) [facility])    Mammogram 01/02/17 - Birads II.  On aromasin.            Einar Pheasant, MD

## 2017-01-31 NOTE — Progress Notes (Signed)
Pre-visit discussion using our clinic review tool. No additional management support is needed unless otherwise documented below in the visit note.  

## 2017-02-10 ENCOUNTER — Encounter: Payer: Self-pay | Admitting: Internal Medicine

## 2017-02-10 NOTE — Assessment & Plan Note (Addendum)
Physical today 01/31/17.  PAP negative with negative HPV.  Atrophy changes present.  Mammogram 01/02/17 - Birads II.  Colonoscopy 02/06/12 - internal hemorrhoids.

## 2017-02-10 NOTE — Assessment & Plan Note (Signed)
Stable.  Takes lorazepam.

## 2017-02-10 NOTE — Assessment & Plan Note (Signed)
Mammogram 01/02/17 - Birads II.  On aromasin.

## 2017-02-11 ENCOUNTER — Telehealth: Payer: Self-pay | Admitting: Internal Medicine

## 2017-02-11 ENCOUNTER — Telehealth: Payer: Self-pay | Admitting: Radiology

## 2017-02-11 NOTE — Telephone Encounter (Signed)
Pt called and stated that when she came in for her physical on 2/8 Dr. Nicki Reaper had stated something about a spot on her nose. PT just wanted to follow up on that, she didn't see a referral or anything on her Mychart. Pt does have an appt with her dermatologist in May. Please advise, thank you!

## 2017-02-11 NOTE — Telephone Encounter (Signed)
pls advise did not see referral or anything in chart note.

## 2017-02-11 NOTE — Telephone Encounter (Signed)
Pt coming in for labs tomorrow, please place future orders. Thank you.  

## 2017-02-12 ENCOUNTER — Other Ambulatory Visit (INDEPENDENT_AMBULATORY_CARE_PROVIDER_SITE_OTHER): Payer: BC Managed Care – PPO

## 2017-02-12 ENCOUNTER — Other Ambulatory Visit: Payer: Self-pay | Admitting: Internal Medicine

## 2017-02-12 DIAGNOSIS — Z13220 Encounter for screening for lipoid disorders: Secondary | ICD-10-CM

## 2017-02-12 DIAGNOSIS — G43809 Other migraine, not intractable, without status migrainosus: Secondary | ICD-10-CM

## 2017-02-12 DIAGNOSIS — Z860100 Personal history of colon polyps, unspecified: Secondary | ICD-10-CM | POA: Insufficient documentation

## 2017-02-12 LAB — CBC WITH DIFFERENTIAL/PLATELET
Basophils Absolute: 0.1 10*3/uL (ref 0.0–0.1)
Basophils Relative: 1.7 % (ref 0.0–3.0)
EOS ABS: 0.1 10*3/uL (ref 0.0–0.7)
EOS PCT: 1.5 % (ref 0.0–5.0)
HCT: 40.2 % (ref 36.0–46.0)
HEMOGLOBIN: 13.9 g/dL (ref 12.0–15.0)
Lymphocytes Relative: 30.8 % (ref 12.0–46.0)
Lymphs Abs: 1.6 10*3/uL (ref 0.7–4.0)
MCHC: 34.7 g/dL (ref 30.0–36.0)
MCV: 94.6 fl (ref 78.0–100.0)
MONO ABS: 0.5 10*3/uL (ref 0.1–1.0)
Monocytes Relative: 10.4 % (ref 3.0–12.0)
Neutro Abs: 2.9 10*3/uL (ref 1.4–7.7)
Neutrophils Relative %: 55.6 % (ref 43.0–77.0)
Platelets: 277 10*3/uL (ref 150.0–400.0)
RBC: 4.25 Mil/uL (ref 3.87–5.11)
RDW: 12.1 % (ref 11.5–15.5)
WBC: 5.2 10*3/uL (ref 4.0–10.5)

## 2017-02-12 LAB — COMPREHENSIVE METABOLIC PANEL
ALBUMIN: 4.5 g/dL (ref 3.5–5.2)
ALK PHOS: 71 U/L (ref 39–117)
ALT: 14 U/L (ref 0–35)
AST: 18 U/L (ref 0–37)
BILIRUBIN TOTAL: 0.7 mg/dL (ref 0.2–1.2)
BUN: 15 mg/dL (ref 6–23)
CO2: 31 mEq/L (ref 19–32)
Calcium: 9.6 mg/dL (ref 8.4–10.5)
Chloride: 104 mEq/L (ref 96–112)
Creatinine, Ser: 0.81 mg/dL (ref 0.40–1.20)
GFR: 75.48 mL/min (ref >60.00)
Glucose, Bld: 90 mg/dL (ref 70–99)
Potassium: 3.8 mEq/L (ref 3.5–5.1)
SODIUM: 141 meq/L (ref 135–145)
TOTAL PROTEIN: 6.9 g/dL (ref 6.0–8.3)

## 2017-02-12 LAB — LIPID PANEL
Cholesterol: 205 mg/dL — ABNORMAL HIGH (ref 0–200)
HDL: 68 mg/dL (ref >39.00)
LDL Cholesterol: 124 mg/dL — ABNORMAL HIGH (ref 0–99)
NONHDL: 136.52
Total CHOL/HDL Ratio: 3
Triglycerides: 63 mg/dL (ref 0.0–149.0)
VLDL: 12.6 mg/dL (ref 0.0–40.0)

## 2017-02-12 LAB — TSH: TSH: 2.58 u[IU]/mL (ref 0.35–4.50)

## 2017-02-12 NOTE — Progress Notes (Signed)
Order placed for labs.

## 2017-02-12 NOTE — Telephone Encounter (Signed)
Lm to call office

## 2017-02-12 NOTE — Telephone Encounter (Signed)
Order placed for labs.

## 2017-02-12 NOTE — Telephone Encounter (Signed)
Please confirm with pt that she is talking about a skin lesion on her nose.  If so, I can place a referral for earlier appt with her dermatologist.  See which dermatologist she sees.  See me before calling pt.  Thanks

## 2017-02-13 ENCOUNTER — Encounter: Payer: Self-pay | Admitting: Internal Medicine

## 2017-02-14 NOTE — Telephone Encounter (Signed)
Yes the it is the lesion on her nose she has app in may for whole body check if you think it would be ok to wait that long. She feels like the spot is better it is only a little red now. So unless you feel like it is emergency she would rather wait.

## 2017-02-15 NOTE — Telephone Encounter (Signed)
lmtrc

## 2017-02-15 NOTE — Telephone Encounter (Signed)
If the lesion is persistent, I would like for her to go ahead and get it evaluated.  Please let her know that I can go ahead and place the order for the referral.  (let me know which dermatologist she prefers to see).

## 2017-02-18 NOTE — Telephone Encounter (Signed)
lmtrc

## 2017-02-25 NOTE — Telephone Encounter (Signed)
Spoke to patient she does not want to make app at this time she has f/u in may and does not want to be seen before then.

## 2017-04-18 ENCOUNTER — Other Ambulatory Visit: Payer: Self-pay | Admitting: Internal Medicine

## 2017-04-19 NOTE — Telephone Encounter (Signed)
Last refill was 12/09/16, please advise , had an appt a few months ago, but follow up is next year in February 2019

## 2017-04-19 NOTE — Telephone Encounter (Signed)
Please confirm with pt how often she is taking the fioricet.  This can cause rebound headaches if taken frequently.  It appears that #60 rx sent in in 11/2016 with one refill.  Please confirm if this is correct and if she has used all of these.

## 2017-04-22 NOTE — Telephone Encounter (Signed)
Pt was notified and rx has been signed and faxed.

## 2017-04-22 NOTE — Telephone Encounter (Signed)
Refilled and printed  For 30 days

## 2017-04-22 NOTE — Telephone Encounter (Signed)
Spoke with the patient  She is only taking it maybe 2 a month, she is starting on Medicare tomorrow and it is a tier 4 which the cost is very high, so she is wanting to get it filled today to make sure she has it.  Please advise in Dr. Bary Leriche absence thanks.

## 2017-04-23 DIAGNOSIS — M7710 Lateral epicondylitis, unspecified elbow: Secondary | ICD-10-CM | POA: Insufficient documentation

## 2017-04-23 DIAGNOSIS — M7712 Lateral epicondylitis, left elbow: Secondary | ICD-10-CM | POA: Diagnosis not present

## 2017-05-15 ENCOUNTER — Telehealth: Payer: Self-pay | Admitting: Internal Medicine

## 2017-05-15 ENCOUNTER — Ambulatory Visit (INDEPENDENT_AMBULATORY_CARE_PROVIDER_SITE_OTHER): Payer: PPO | Admitting: Family

## 2017-05-15 ENCOUNTER — Encounter: Payer: Self-pay | Admitting: Family

## 2017-05-15 VITALS — BP 110/68 | HR 60 | Temp 98.6°F | Resp 12 | Wt 143.2 lb

## 2017-05-15 DIAGNOSIS — R079 Chest pain, unspecified: Secondary | ICD-10-CM | POA: Insufficient documentation

## 2017-05-15 LAB — COMPREHENSIVE METABOLIC PANEL
ALBUMIN: 4.5 g/dL (ref 3.5–5.2)
ALT: 14 U/L (ref 0–35)
AST: 19 U/L (ref 0–37)
Alkaline Phosphatase: 71 U/L (ref 39–117)
BUN: 15 mg/dL (ref 6–23)
CALCIUM: 9.8 mg/dL (ref 8.4–10.5)
CHLORIDE: 105 meq/L (ref 96–112)
CO2: 27 mEq/L (ref 19–32)
CREATININE: 0.82 mg/dL (ref 0.40–1.20)
GFR: 74.36 mL/min (ref >60.00)
Glucose, Bld: 100 mg/dL — ABNORMAL HIGH (ref 70–99)
POTASSIUM: 4.4 meq/L (ref 3.5–5.1)
Sodium: 139 mEq/L (ref 135–145)
Total Bilirubin: 0.5 mg/dL (ref 0.2–1.2)
Total Protein: 6.8 g/dL (ref 6.0–8.3)

## 2017-05-15 LAB — CBC WITH DIFFERENTIAL/PLATELET
BASOS PCT: 1.8 % (ref 0.0–3.0)
Basophils Absolute: 0.1 10*3/uL (ref 0.0–0.1)
EOS PCT: 2.2 % (ref 0.0–5.0)
Eosinophils Absolute: 0.1 10*3/uL (ref 0.0–0.7)
HEMATOCRIT: 38.9 % (ref 36.0–46.0)
HEMOGLOBIN: 13.2 g/dL (ref 12.0–15.0)
LYMPHS PCT: 28.2 % (ref 12.0–46.0)
Lymphs Abs: 1.5 10*3/uL (ref 0.7–4.0)
MCHC: 34.1 g/dL (ref 30.0–36.0)
MCV: 95.4 fl (ref 78.0–100.0)
Monocytes Absolute: 0.5 10*3/uL (ref 0.1–1.0)
Monocytes Relative: 9.7 % (ref 3.0–12.0)
NEUTROS ABS: 3 10*3/uL (ref 1.4–7.7)
Neutrophils Relative %: 58.1 % (ref 43.0–77.0)
Platelets: 270 10*3/uL (ref 150.0–400.0)
RBC: 4.08 Mil/uL (ref 3.87–5.11)
RDW: 12.5 % (ref 11.5–15.5)
WBC: 5.2 10*3/uL (ref 4.0–10.5)

## 2017-05-15 NOTE — Progress Notes (Signed)
Pre-visit discussion using our clinic review tool. No additional management support is needed unless otherwise documented below in the visit note.  

## 2017-05-15 NOTE — Telephone Encounter (Signed)
Pt called and stated that she tried to call her insurance company to see if the test that M. Arnett would like done would be covered. Insurance company stated that the office needed to call to find out that information. Please advise, thank you!  Call Provider Services @ [phone]

## 2017-05-15 NOTE — Assessment & Plan Note (Addendum)
Central chest pain. Reassured by normal EKG and chest pain during exam. When compared to prior EKG, 2016, no significant changes. No acute ischemia. Normal sinus rhythm. Reassured as patient does not have chest wall pain while in exam room. It is not reproducible with palpation, inspiration. Patient does not report lifting anything heavy, injury. Discussed with patient etiology of chest wall pain is nonspecific at this point. Advised her to have a CT angiogram chest symptom has been persistent for 2 weeks. Patient would like to call insurance company prior to scheduling. PEnding labs. Advised patient very close vigilance. If CT angiogram negative, working diagnoses include costochondritis, muscular skeletal strain, atypical GERD.

## 2017-05-15 NOTE — Patient Instructions (Addendum)
Reassured by normal EKG As discussed, your symptom is not specific at this time so please stay VERY VIGILANT.   You may try Zantac, Prilosec over-the-counter to see if this is atypical symptoms for acid reflux.  As discussed, considering muscular skeletal etiology such as costochondritis. Information below.   Please call your insurance company and ask them if they would cover a CT Angio Chest and let us know if you would like to schedule. I have ordered stat.      Chest Wall Pain Chest wall pain is pain in or around the bones and muscles of your chest. Sometimes, an injury causes this pain. Sometimes, the cause may not be known. This pain may take several weeks or longer to get better. Follow these instructions at home: Pay attention to any changes in your symptoms. Take these actions to help with your pain:  Rest as told by your health care provider.  Avoid activities that cause pain. These include any activities that use your chest muscles or your abdominal and side muscles to lift heavy items.  If directed, apply ice to the painful area:  Put ice in a plastic bag.  Place a towel between your skin and the bag.  Leave the ice on for 20 minutes, 2-3 times per day.  Take over-the-counter and prescription medicines only as told by your health care provider.  Do not use tobacco products, including cigarettes, chewing tobacco, and e-cigarettes. If you need help quitting, ask your health care provider.  Keep all follow-up visits as told by your health care provider. This is important. Contact a health care provider if:  You have a fever.  Your chest pain becomes worse.  You have new symptoms. Get help right away if:  You have nausea or vomiting.  You feel sweaty or light-headed.  You have a cough with phlegm (sputum) or you cough up blood.  You develop shortness of breath. This information is not intended to replace advice given to you by your health care provider. Make  sure you discuss any questions you have with your health care provider. Document Released: 12/10/2005 Document Revised: 04/19/2016 Document Reviewed: 03/07/2015 Elsevier Interactive Patient Education  2017 [employer].

## 2017-05-15 NOTE — Progress Notes (Signed)
Subjective:    [name], female    DOB: 07/02/1951, 66 y.o.   MRN: [id number]  CC: Cindy Arnold is a 66 y.o. female who presents today for an acute visit.    HPI: CC: constant central chest pain from front to back started 2 weeks ago. Occurs when sitting.   Discomfort not related to eating, deep breath, when presses on chest, no increased belching. No epigastric burning. Does occasionnally get heart burn 'on occaisonal';   Describes as discomfort and radiates to back. No stabbing, tearing pain.  On mobic.  Denies numbness or tingling radiating to left arm or jaw, palpitations, dizziness, frequent headaches, changes in vision, or shortness of breath.    H/o breast cancer - right   Mammogram UTD.        HISTORY:  Past Medical History:  Diagnosis Date  . Basal cell carcinoma    multiple removed/[facility]  . Breast cancer ([place]) 12/13   s/p right lumpectomy, s/p chemo and XRT  . DES exposure in utero   . Hepatitis A   . History of chicken pox   . Hyperactive airway disease   . 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 Migraines   . Miscarriage    x3   Past Surgical History:  Procedure Laterality Date  . BASAL CELL CARCINOMA EXCISION     Multiple  . BREAST BIOPSY  1975   Cyst  . BREAST LUMPECTOMY WITH AXILLARY LYMPH NODE DISSECTION  01/06/13, 01/22/13   Dr. Drue Arnold, [facility], second surgery for pos margin  . CERVICAL FUSION  2001  . DILATION AND CURETTAGE OF UTERUS     x 3  . VAGINAL DELIVERY     x4   Family History  Problem Relation Age of Onset  . Pancreatic cancer Mother   . Cancer Mother        Pancreatic  . Arthritis Mother   . Hyperlipidemia Mother   . Hypertension Mother   . Heart attack Father   . Hyperlipidemia Father   . Hypertension Father   . Heart disease Father 79  . Colon cancer Other   . Cancer Other 50       Breast and Ovarian- dx 50's/Colon  . Birth defects Maternal Aunt        Breast - in 12's  . Stroke  Maternal Grandmother   . Birth defects Maternal Aunt        Breast - in 47's    Allergies: Patient has no known allergies. Current Outpatient Prescriptions on File Prior to Visit  Medication Sig Dispense Refill  . Biotin 5000 MCG TABS Take by mouth daily.    . butalbital-aspirin-caffeine (FIORINAL) 50-325-40 MG capsule TAKE ONE CAPSULE TWICE A DAY AS NEEDED FOR HEADACHE 60 capsule 0  . Calcium-Magnesium-Vitamin D (CALCIUM 500 PO) Take 2 tablets by mouth daily.    Marland Kitchen exemestane (AROMASIN) 25 MG tablet Take 25 mg by mouth daily after breakfast.    . fish oil-omega-3 fatty acids 1000 MG capsule Take 1 g by mouth 2 (two) times daily.    . Glucos-Chond-Sterol-Fish Oil (GLUCOSAMINE CHONDROITIN PLUS PO) Take by mouth daily.    . Glucosamine-Chondroit-Vit C-Mn (GLUCOSAMINE CHONDR 1500 COMPLX) CAPS Take by mouth.    . hydrOXYzine (ATARAX/VISTARIL) 10 MG tablet TAKE ONE TABLET THREE TIMES A DAY AS NEEDED FOR ITCHING 30 tablet 0  . ipratropium (ATROVENT) 0.06 % nasal spray Place 2 sprays into the nose 3 (three) times daily.  30 mL 6  . LORazepam (ATIVAN) 0.5 MG tablet Take 1 tablet (0.5 mg total) by mouth daily as needed for anxiety. 30 tablet 1  . Multiple Vitamins-Minerals (MULTIVITAMIN WITH MINERALS) tablet Take 1 tablet by mouth daily.    . propranolol (INDERAL) 40 MG tablet Take 1 tablet (40 mg total) by mouth daily. 90 tablet 3  . triamcinolone cream (KENALOG) 0.1 %     . Turmeric Curcumin 500 MG CAPS Take by mouth.    . zolpidem (AMBIEN) 5 MG tablet Take 1 tablet (5 mg total) by mouth at bedtime as needed. 30 tablet 1   No current facility-administered medications on file prior to visit.     Social History  Substance Use Topics  . Smoking status: Never Smoker  . Smokeless tobacco: Never Used  . Alcohol use 0.0 oz/week     Comment: 3 times a week    Review of Systems  Constitutional: Negative for chills and fever.  Eyes: Negative for visual disturbance.  Respiratory: Negative for cough  and chest tightness.   Cardiovascular: Positive for chest pain. Negative for palpitations.  Gastrointestinal: Negative for nausea and vomiting.  Neurological: Negative for dizziness and headaches.      Objective:    BP 110/68 (BP Location: Left Arm, Patient Position: Sitting, Cuff Size: Normal)   Pulse 60   Temp 98.6 F (37 C) (Oral)   Resp 12   Wt 143 lb 3.2 oz (65 kg)   SpO2 98%   BMI 21.77 kg/m    Physical Exam  Constitutional: She appears well-developed and well-nourished.  Eyes: Conjunctivae are normal.  Cardiovascular: Normal rate, regular rhythm, normal heart sounds and normal pulses.   Pulmonary/Chest: Effort normal and breath sounds normal. She has no wheezes. She has no rhonchi. She has no rales. She exhibits no tenderness and no bony tenderness. Right breast exhibits no inverted nipple, no mass, no nipple discharge, no skin change and no tenderness. Left breast exhibits no inverted nipple, no mass, no nipple discharge, no skin change and no tenderness. Breasts are symmetrical.  Clinical breast exam performed, no masses, tenderness. No rash. No chest wall tenderness.  No pain with deep inspiration.  Patient notes no chest pain during exam.   Musculoskeletal:       Thoracic back: She exhibits normal range of motion, no tenderness, no bony tenderness, no swelling, no edema, no pain and no spasm.  Neurological: She is alert.  Skin: Skin is warm and dry.  Psychiatric: She has a normal mood and affect. Her speech is normal and behavior is normal. Thought content normal.  Vitals reviewed.      Assessment & Plan:   Problem List Items Addressed This Visit      Other   Chest pain - Primary    Central chest pain. Reassured by normal EKG and chest pain during exam. When compared to prior EKG, 2016, no significant changes. No acute ischemia. Normal sinus rhythm. Reassured as patient does not have chest wall pain while in exam room. It is not reproducible with palpation,  inspiration. Patient does not report lifting anything heavy, injury. Discussed with patient etiology of chest wall pain is nonspecific at this point. Advised her to have a CT angiogram chest symptom has been persistent for 2 weeks. Patient would like to call insurance company prior to scheduling. Ending labs. Advised patient very close vigilance. If CT angiogram negative, working diagnoses include costochondritis, muscular skeletal strain, atypical GERD.  Relevant Orders   EKG 12-Lead (Completed)   CT ANGIO CHEST AORTA W &/OR WO CONTRAST   CBC with Differential/Platelet   Comprehensive metabolic panel        I am having Ms. Cuttino maintain her multivitamin with minerals, fish oil-omega-3 fatty acids, Calcium-Magnesium-Vitamin D (CALCIUM 500 PO), exemestane, Biotin, Glucos-Chond-Sterol-Fish Oil (GLUCOSAMINE CHONDROITIN PLUS PO), Turmeric Curcumin, triamcinolone cream, GLUCOSAMINE CHONDR 1500 COMPLX, hydrOXYzine, LORazepam, zolpidem, propranolol, ipratropium, butalbital-aspirin-caffeine, and meloxicam.   Meds ordered this encounter  Medications  . meloxicam (MOBIC) 15 MG tablet    Sig: Take 1 tablet by mouth daily.    Return precautions given.   Risks, benefits, and alternatives of the medications and treatment plan prescribed today were discussed, and patient expressed understanding.   Education regarding symptom management and diagnosis given to patient on AVS.  Continue to follow with Einar Pheasant, MD for routine health maintenance.   Cindy Arnold and I agreed with plan.   Mable Paris, FNP

## 2017-05-16 ENCOUNTER — Telehealth: Payer: Self-pay | Admitting: Family

## 2017-05-16 ENCOUNTER — Ambulatory Visit
Admission: RE | Admit: 2017-05-16 | Discharge: 2017-05-16 | Disposition: A | Discharge status: Home / Self Care | Payer: PPO | Source: Ambulatory Visit | Attending: Family | Admitting: Family

## 2017-05-16 DIAGNOSIS — Z9889 Other specified postprocedural states: Secondary | ICD-10-CM | POA: Insufficient documentation

## 2017-05-16 DIAGNOSIS — R079 Chest pain, unspecified: Secondary | ICD-10-CM | POA: Diagnosis not present

## 2017-05-16 MED ORDER — IOPAMIDOL (ISOVUE-370) INJECTION 76%
100.0000 mL | Freq: Once | INTRAVENOUS | Status: AC | PRN
  Administered 2017-05-16: 100 mL via INTRAVENOUS

## 2017-05-16 NOTE — Telephone Encounter (Signed)
Pt called back stating she received the message. 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 Thank you!

## 2017-05-16 NOTE — Telephone Encounter (Signed)
Do you need this?  

## 2017-05-16 NOTE — Telephone Encounter (Signed)
Left detailed message about ct angio

## 2017-06-03 DIAGNOSIS — L578 Other skin changes due to chronic exposure to nonionizing radiation: Secondary | ICD-10-CM | POA: Diagnosis not present

## 2017-06-03 DIAGNOSIS — L821 Other seborrheic keratosis: Secondary | ICD-10-CM | POA: Diagnosis not present

## 2017-06-03 DIAGNOSIS — L300 Nummular dermatitis: Secondary | ICD-10-CM | POA: Diagnosis not present

## 2017-06-03 DIAGNOSIS — L812 Freckles: Secondary | ICD-10-CM | POA: Diagnosis not present

## 2017-06-03 DIAGNOSIS — D692 Other nonthrombocytopenic purpura: Secondary | ICD-10-CM | POA: Diagnosis not present

## 2017-06-03 DIAGNOSIS — D1800 Hemangioma unspecified site: Secondary | ICD-10-CM | POA: Diagnosis not present

## 2017-06-03 DIAGNOSIS — L820 Inflamed seborrheic keratosis: Secondary | ICD-10-CM | POA: Diagnosis not present

## 2017-06-03 DIAGNOSIS — L718 Other rosacea: Secondary | ICD-10-CM | POA: Diagnosis not present

## 2017-06-03 DIAGNOSIS — D229 Melanocytic nevi, unspecified: Secondary | ICD-10-CM | POA: Diagnosis not present

## 2017-06-03 DIAGNOSIS — Z85828 Personal history of other malignant neoplasm of skin: Secondary | ICD-10-CM | POA: Diagnosis not present

## 2017-06-12 DIAGNOSIS — Z79811 Long term (current) use of aromatase inhibitors: Secondary | ICD-10-CM | POA: Diagnosis not present

## 2017-06-12 DIAGNOSIS — M8580 Other specified disorders of bone density and structure, unspecified site: Secondary | ICD-10-CM | POA: Diagnosis not present

## 2017-06-12 DIAGNOSIS — C50911 Malignant neoplasm of unspecified site of right female breast: Secondary | ICD-10-CM | POA: Diagnosis not present

## 2017-07-04 DIAGNOSIS — M7501 Adhesive capsulitis of right shoulder: Secondary | ICD-10-CM | POA: Diagnosis not present

## 2017-07-04 DIAGNOSIS — M7712 Lateral epicondylitis, left elbow: Secondary | ICD-10-CM | POA: Diagnosis not present

## 2017-07-04 DIAGNOSIS — M7552 Bursitis of left shoulder: Secondary | ICD-10-CM | POA: Diagnosis not present

## 2017-07-18 DIAGNOSIS — M654 Radial styloid tenosynovitis [de Quervain]: Secondary | ICD-10-CM | POA: Diagnosis not present

## 2017-07-18 DIAGNOSIS — M7501 Adhesive capsulitis of right shoulder: Secondary | ICD-10-CM | POA: Diagnosis not present

## 2017-08-07 DIAGNOSIS — M25511 Pain in right shoulder: Secondary | ICD-10-CM | POA: Diagnosis not present

## 2017-08-07 DIAGNOSIS — M7712 Lateral epicondylitis, left elbow: Secondary | ICD-10-CM | POA: Diagnosis not present

## 2017-08-07 DIAGNOSIS — M25611 Stiffness of right shoulder, not elsewhere classified: Secondary | ICD-10-CM | POA: Diagnosis not present

## 2017-08-07 DIAGNOSIS — M5010 Cervical disc disorder with radiculopathy, unspecified cervical region: Secondary | ICD-10-CM | POA: Diagnosis not present

## 2017-08-14 DIAGNOSIS — M7712 Lateral epicondylitis, left elbow: Secondary | ICD-10-CM | POA: Diagnosis not present

## 2017-08-14 DIAGNOSIS — M25511 Pain in right shoulder: Secondary | ICD-10-CM | POA: Diagnosis not present

## 2017-08-14 DIAGNOSIS — M25611 Stiffness of right shoulder, not elsewhere classified: Secondary | ICD-10-CM | POA: Diagnosis not present

## 2017-08-16 DIAGNOSIS — M25511 Pain in right shoulder: Secondary | ICD-10-CM | POA: Diagnosis not present

## 2017-08-16 DIAGNOSIS — M7712 Lateral epicondylitis, left elbow: Secondary | ICD-10-CM | POA: Diagnosis not present

## 2017-08-21 DIAGNOSIS — M7712 Lateral epicondylitis, left elbow: Secondary | ICD-10-CM | POA: Diagnosis not present

## 2017-08-21 DIAGNOSIS — M25511 Pain in right shoulder: Secondary | ICD-10-CM | POA: Diagnosis not present

## 2017-08-23 DIAGNOSIS — M7712 Lateral epicondylitis, left elbow: Secondary | ICD-10-CM | POA: Diagnosis not present

## 2017-08-23 DIAGNOSIS — M25511 Pain in right shoulder: Secondary | ICD-10-CM | POA: Diagnosis not present

## 2017-08-28 DIAGNOSIS — M7712 Lateral epicondylitis, left elbow: Secondary | ICD-10-CM | POA: Diagnosis not present

## 2017-08-28 DIAGNOSIS — M25511 Pain in right shoulder: Secondary | ICD-10-CM | POA: Diagnosis not present

## 2017-08-30 DIAGNOSIS — M7712 Lateral epicondylitis, left elbow: Secondary | ICD-10-CM | POA: Diagnosis not present

## 2017-08-30 DIAGNOSIS — M25511 Pain in right shoulder: Secondary | ICD-10-CM | POA: Diagnosis not present

## 2017-09-03 DIAGNOSIS — M25511 Pain in right shoulder: Secondary | ICD-10-CM | POA: Diagnosis not present

## 2017-09-03 DIAGNOSIS — M25611 Stiffness of right shoulder, not elsewhere classified: Secondary | ICD-10-CM | POA: Diagnosis not present

## 2017-09-03 DIAGNOSIS — M7712 Lateral epicondylitis, left elbow: Secondary | ICD-10-CM | POA: Diagnosis not present

## 2017-09-23 NOTE — Telephone Encounter (Signed)
Error

## 2017-09-26 DIAGNOSIS — L812 Freckles: Secondary | ICD-10-CM | POA: Diagnosis not present

## 2017-09-26 DIAGNOSIS — L309 Dermatitis, unspecified: Secondary | ICD-10-CM | POA: Diagnosis not present

## 2017-09-26 DIAGNOSIS — L570 Actinic keratosis: Secondary | ICD-10-CM | POA: Diagnosis not present

## 2017-09-26 DIAGNOSIS — D485 Neoplasm of uncertain behavior of skin: Secondary | ICD-10-CM | POA: Diagnosis not present

## 2017-09-26 DIAGNOSIS — L821 Other seborrheic keratosis: Secondary | ICD-10-CM | POA: Diagnosis not present

## 2017-09-26 DIAGNOSIS — Z85828 Personal history of other malignant neoplasm of skin: Secondary | ICD-10-CM | POA: Diagnosis not present

## 2017-09-26 DIAGNOSIS — L853 Xerosis cutis: Secondary | ICD-10-CM | POA: Diagnosis not present

## 2017-10-03 DIAGNOSIS — L570 Actinic keratosis: Secondary | ICD-10-CM | POA: Diagnosis not present

## 2017-10-03 DIAGNOSIS — L578 Other skin changes due to chronic exposure to nonionizing radiation: Secondary | ICD-10-CM | POA: Diagnosis not present

## 2017-11-18 DIAGNOSIS — L820 Inflamed seborrheic keratosis: Secondary | ICD-10-CM | POA: Diagnosis not present

## 2017-11-18 DIAGNOSIS — L570 Actinic keratosis: Secondary | ICD-10-CM | POA: Diagnosis not present

## 2017-11-18 DIAGNOSIS — L578 Other skin changes due to chronic exposure to nonionizing radiation: Secondary | ICD-10-CM | POA: Diagnosis not present

## 2017-11-19 DIAGNOSIS — J219 Acute bronchiolitis, unspecified: Secondary | ICD-10-CM | POA: Insufficient documentation

## 2017-11-19 DIAGNOSIS — J208 Acute bronchitis due to other specified organisms: Secondary | ICD-10-CM | POA: Diagnosis not present

## 2017-12-06 ENCOUNTER — Encounter: Payer: Self-pay | Admitting: Internal Medicine

## 2017-12-06 ENCOUNTER — Ambulatory Visit (INDEPENDENT_AMBULATORY_CARE_PROVIDER_SITE_OTHER): Payer: PPO | Admitting: Internal Medicine

## 2017-12-06 VITALS — BP 106/76 | HR 63 | Temp 97.8°F | Ht 68.0 in | Wt 142.0 lb

## 2017-12-06 DIAGNOSIS — J069 Acute upper respiratory infection, unspecified: Secondary | ICD-10-CM

## 2017-12-06 DIAGNOSIS — Z0000 Encounter for general adult medical examination without abnormal findings: Secondary | ICD-10-CM

## 2017-12-06 DIAGNOSIS — J4520 Mild intermittent asthma, uncomplicated: Secondary | ICD-10-CM | POA: Diagnosis not present

## 2017-12-06 DIAGNOSIS — C50919 Malignant neoplasm of unspecified site of unspecified female breast: Secondary | ICD-10-CM | POA: Diagnosis not present

## 2017-12-06 DIAGNOSIS — Z170 Estrogen receptor positive status [ER+]: Secondary | ICD-10-CM | POA: Diagnosis not present

## 2017-12-06 NOTE — Patient Instructions (Signed)
nasacort nasal spray - 2 sprays each nostril one time per day.  Do this in the evening.   

## 2017-12-06 NOTE — Progress Notes (Signed)
Patient ID: MORENE CECILIO, female   DOB: 05-04-1951, 66 y.o.   MRN: [id number] The patient is here for annual Medicare wellness examination and management of other chronic and acute problems.   The risk factors are reflected in the social history.  The roster of all physicians providing medical care to patient - Dr Norman Herrlich (dentist), [facility]   Activities of daily living:  The patient is 100% independent in all ADLs: dressing, toileting, feeding as well as independent mobility  Home safety : The patient has smoke detectors in the home.  There are no firearms at home. There is no violence in the home.   There is no risks for hepatitis, STDs or HIV. There is no   history of blood transfusion. No travel history to infectious disease endemic areas of the world.  She is followed regularly by her dentist and eye doctor.  No hearing difficulty with regard to whispered voices, etc.  She does not  have excessive sun exposure.    Diet: the importance of a healthy diet is discussed.   The benefits of regular aerobic exercise were discussed.    Depression screen: there are no signs or vegative symptoms of depression- irritability, change in appetite, anhedonia, sadness/tearfullness.  Cognitive assessment: the patient manages all their financial and personal affairs and is actively engaged.  The following portions of the patient's history were reviewed and updated as appropriate: allergies, current medications, past family history, past medical history,  past surgical history, past social history  and problem list.  Visual acuity was not assessed per patient preference since she has regular follow up with her ophthalmologist. Hearing and body mass index were assessed and reviewed.   During the course of the visit the patient was educated and counseled about appropriate screening and preventive services including : fall prevention , diabetes screening, nutrition counseling, colorectal cancer  screening, and recommended immunizations.  She is up to date with immunizations.    She has a living will.  Discussed with her at this visit.      Subjective:    Patient ID: [name], female    DOB: February 15, 1951, 66 y.o.   MRN: [id number]  HPI  Patient also reports having some increased congestion and cough.  Recently evaluated and treated with prednisone and abx.  She is better.  Some increased congestion and drainage.  Cough is better.  No chest pain.  No sob.  No acid reflux.  No abdominal pain.  Bowels moving.  Has retired.  Stress is better.     Past Medical History:  Diagnosis Date  . Basal cell carcinoma    multiple removed/[facility]  . Breast cancer ([place]) 12/13   s/p right lumpectomy, s/p chemo and XRT  . DES exposure in utero   . Hepatitis A   . History of chicken pox   . Hyperactive airway disease   . 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[place])   . Migraines   . Miscarriage    x3   Past Surgical History:  Procedure Laterality Date  . BASAL CELL CARCINOMA EXCISION     Multiple  . BREAST BIOPSY  1975   Cyst  . BREAST LUMPECTOMY WITH AXILLARY LYMPH NODE DISSECTION  01/06/13, 01/22/13   Dr. Drue Dun, [facility], second surgery for pos margin  . CERVICAL FUSION  2001  . DILATION AND CURETTAGE OF UTERUS     x 3  . VAGINAL DELIVERY     x4   Family History  Problem Relation Age of Onset  . Pancreatic cancer Mother   . Cancer Mother        Pancreatic  . Arthritis Mother   . Hyperlipidemia Mother   . Hypertension Mother   . Heart attack Father   . Hyperlipidemia Father   . Hypertension Father   . Heart disease Father 28  . Colon cancer Other   . Cancer Other 50       Breast and Ovarian- dx 50's/Colon  . Birth defects Maternal Aunt        Breast - in 84's  . Stroke Maternal Grandmother   . Birth defects Maternal Aunt        Breast - in 56's   Social History   Socioeconomic History  . Marital status: Married    Spouse name: None  . Number of children: 4    . Years of education: None  . Highest education level: None  Social Needs  . Financial resource strain: None  . Food insecurity - worry: None  . Food insecurity - inability: None  . Transportation needs - medical: None  . Transportation needs - non-medical: None  Occupational History  . Occupation: Corporate treasurer - [employer]  Tobacco Use  . Smoking status: Never Smoker  . Smokeless tobacco: Never Used  Substance and Sexual Activity  . Alcohol use: Yes    Alcohol/week: 0.0 oz    Comment: 3 times a week  . Drug use: No  . Sexual activity: None  Other Topics Concern  . None  Social History Narrative   Lives in [address] with husband.  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     Outpatient Encounter Medications as of 12/06/2017  Medication Sig  . Biotin 5000 MCG TABS Take by mouth daily.  . butalbital-aspirin-caffeine (FIORINAL) 50-325-40 MG capsule TAKE ONE CAPSULE TWICE A DAY AS NEEDED FOR HEADACHE  . Calcium-Magnesium-Vitamin D (CALCIUM 500 PO) Take 2 tablets by mouth daily.  Marland Kitchen exemestane (AROMASIN) 25 MG tablet Take 25 mg by mouth daily after breakfast.  . fish oil-omega-3 fatty acids 1000 MG capsule Take 1 g by mouth 2 (two) times daily.  . Glucos-Chond-Sterol-Fish Oil (GLUCOSAMINE CHONDROITIN PLUS PO) Take by mouth daily.  . Glucosamine-Chondroit-Vit C-Mn (GLUCOSAMINE CHONDR 1500 COMPLX) CAPS Take by mouth.  . hydrOXYzine (ATARAX/VISTARIL) 10 MG tablet TAKE ONE TABLET THREE TIMES A DAY AS NEEDED FOR ITCHING  . ipratropium (ATROVENT) 0.06 % nasal spray Place 2 sprays into the nose 3 (three) times daily.  Marland Kitchen LORazepam (ATIVAN) 0.5 MG tablet Take 1 tablet (0.5 mg total) by mouth daily as needed for anxiety.  . meloxicam (MOBIC) 15 MG tablet Take 1 tablet by mouth daily.  . Multiple Vitamins-Minerals (MULTIVITAMIN WITH MINERALS) tablet Take 1 tablet by mouth daily.  . propranolol (INDERAL) 40 MG tablet Take 1 tablet (40 mg total) by mouth daily.  Marland Kitchen triamcinolone cream (KENALOG) 0.1 %    . Turmeric Curcumin 500 MG CAPS Take by mouth.  . zolpidem (AMBIEN) 5 MG tablet Take 1 tablet (5 mg total) by mouth at bedtime as needed.   No facility-administered encounter medications on file as of 12/06/2017.     Review of Systems  Constitutional: Negative for appetite change and unexpected weight change.  HENT: Positive for congestion. Negative for sinus pressure.   Respiratory: Negative for chest tightness and shortness of breath.        Cough is better.   Cardiovascular: Negative for chest pain, palpitations and leg swelling.  Gastrointestinal: Negative  for abdominal pain, diarrhea, nausea and vomiting.  Genitourinary: Negative for difficulty urinating and dysuria.  Musculoskeletal: Negative for back pain and joint swelling.  Skin: Negative for color change and rash.  Neurological: Negative for dizziness, light-headedness and headaches.  Psychiatric/Behavioral: Negative for agitation and dysphoric mood.       Objective:    Physical Exam  Constitutional: She appears well-developed and well-nourished. No distress.  HENT:  Nose: Nose normal.  Mouth/Throat: Oropharynx is clear and moist.  Neck: Neck supple. No thyromegaly present.  Cardiovascular: Normal rate and regular rhythm.  Pulmonary/Chest: Breath sounds normal. No respiratory distress. She has no wheezes.  Abdominal: Soft. Bowel sounds are normal. There is no tenderness.  Musculoskeletal: She exhibits no edema or tenderness.  Lymphadenopathy:    She has no cervical adenopathy.  Skin: No rash noted. No erythema.  Psychiatric: She has a normal mood and affect. Her behavior is normal.    BP 106/76   Pulse 63   Temp 97.8 F (36.6 C) (Oral)   Ht 5\' 8"  (1.727 m)   Wt 142 lb (64.4 kg)   SpO2 97%   BMI 21.59 kg/m  Wt Readings from Last 3 Encounters:  12/06/17 142 lb (64.4 kg)  05/15/17 143 lb 3.2 oz (65 kg)  01/31/17 140 lb 12.8 oz (63.9 kg)     Lab Results  Component Value Date   WBC 5.2 05/15/2017   HGB  13.2 05/15/2017   HCT 38.9 05/15/2017   PLT 270.0 05/15/2017   GLUCOSE 100 (H) 05/15/2017   CHOL 205 (H) 02/12/2017   TRIG 63.0 02/12/2017   HDL 68.00 02/12/2017   LDLCALC 124 (H) 02/12/2017   ALT 14 05/15/2017   AST 19 05/15/2017   NA 139 05/15/2017   K 4.4 05/15/2017   CL 105 05/15/2017   CREATININE 0.82 05/15/2017   BUN 15 05/15/2017   CO2 27 05/15/2017   TSH 2.58 02/12/2017   MICROALBUR <0.7 11/14/2015    Ct Angio Chest Aorta W &/or Wo Contrast  Result Date: 05/16/2017 CLINICAL DATA:  Pt c/o mediastinal substernal chest pain intermediate x 2-3 weeks. NKI. Hx Right Breast CA with Lumpectomy, chemo + rad tx's. Former smoker, quit 45 years ago. EXAM: CT ANGIOGRAPHY CHEST WITH CONTRAST TECHNIQUE: Multidetector CT imaging of the chest was performed using the standard protocol during bolus administration of intravenous contrast. Multiplanar CT image reconstructions and MIPs were obtained to evaluate the vascular anatomy. CONTRAST:  100 cc Isovue 370 COMPARISON:  Chest x-ray 10/27/2015 FINDINGS: Cardiovascular: Heart size is normal. No significant coronary artery calcification. No pericardial effusion. The pulmonary arteries are well opacified. There is no acute pulmonary embolus. Normal appearance of the aortic arch and main pulmonary artery. Mediastinum/Nodes: The visualized portion of the thyroid gland has a normal appearance. No mediastinal, hilar, or axillary adenopathy. Lungs/Pleura: Lungs are clear. No pleural effusion or pneumothorax. Upper Abdomen: No acute abnormality. Musculoskeletal: Mild degenerative changes are seen in the mid thoracic spine. Other: Surgical clips are seen in the right axilla and right breast following prior lumpectomy and axillary node dissection. Review of the MIP images confirms the above findings. IMPRESSION: 1. Technically adequate exam showing no acute pulmonary embolus. 2. Postoperative changes following right lumpectomy and axillary node dissection.  Electronically Signed   By: Nolon Nations M.D.   On: 05/16/2017 10:06       Assessment & Plan:   Problem List Items Addressed This Visit    Asthma    Recently treated for respiratory infection.  Doing better.  Continue inhalers.        Malignant neoplasm of breast (female) ([place])    On aromasin.  Last mammogram 12/2016 - birads II.         Other Visit Diagnoses    Upper respiratory tract infection, unspecified type    -  Primary   Treated with abx and prednisone.  Better.  Some congestion and drainage.  Nasacort nasal spray as directed.  Robitussin DM.  follow.    Medicare welcome visit           Einar Pheasant, MD

## 2017-12-06 NOTE — Progress Notes (Signed)
Pre visit review using our clinic review tool, if applicable. No additional management support is needed unless otherwise documented below in the visit note. 

## 2017-12-08 ENCOUNTER — Encounter: Payer: Self-pay | Admitting: Internal Medicine

## 2017-12-08 NOTE — Assessment & Plan Note (Signed)
Recently treated for respiratory infection.  Doing better.  Continue inhalers.

## 2017-12-08 NOTE — Assessment & Plan Note (Signed)
On aromasin.  Last mammogram 12/2016 - birads II.

## 2018-01-10 DIAGNOSIS — Z853 Personal history of malignant neoplasm of breast: Secondary | ICD-10-CM | POA: Diagnosis not present

## 2018-01-10 DIAGNOSIS — C50911 Malignant neoplasm of unspecified site of right female breast: Secondary | ICD-10-CM | POA: Diagnosis not present

## 2018-02-04 ENCOUNTER — Other Ambulatory Visit (HOSPITAL_COMMUNITY)
Admission: RE | Admit: 2018-02-04 | Discharge: 2018-02-04 | Disposition: A | Discharge status: Home / Self Care | Payer: PPO | Source: Ambulatory Visit | Attending: Internal Medicine | Admitting: Internal Medicine

## 2018-02-04 ENCOUNTER — Encounter: Payer: Self-pay | Admitting: Internal Medicine

## 2018-02-04 ENCOUNTER — Ambulatory Visit (INDEPENDENT_AMBULATORY_CARE_PROVIDER_SITE_OTHER): Payer: PPO | Admitting: Internal Medicine

## 2018-02-04 VITALS — BP 116/70 | HR 82 | Temp 98.2°F | Resp 18 | Wt 147.2 lb

## 2018-02-04 DIAGNOSIS — J4520 Mild intermittent asthma, uncomplicated: Secondary | ICD-10-CM

## 2018-02-04 DIAGNOSIS — M542 Cervicalgia: Secondary | ICD-10-CM | POA: Diagnosis not present

## 2018-02-04 DIAGNOSIS — Z124 Encounter for screening for malignant neoplasm of cervix: Secondary | ICD-10-CM | POA: Insufficient documentation

## 2018-02-04 DIAGNOSIS — Z13220 Encounter for screening for lipoid disorders: Secondary | ICD-10-CM

## 2018-02-04 DIAGNOSIS — Z170 Estrogen receptor positive status [ER+]: Secondary | ICD-10-CM | POA: Diagnosis not present

## 2018-02-04 DIAGNOSIS — C50919 Malignant neoplasm of unspecified site of unspecified female breast: Secondary | ICD-10-CM

## 2018-02-04 DIAGNOSIS — Z0000 Encounter for general adult medical examination without abnormal findings: Secondary | ICD-10-CM | POA: Diagnosis not present

## 2018-02-04 MED ORDER — BUTALBITAL-ASPIRIN-CAFFEINE 50-325-40 MG PO CAPS: ORAL_CAPSULE | ORAL | 0 refills | Status: DC

## 2018-02-04 NOTE — Assessment & Plan Note (Signed)
Breathing stable.

## 2018-02-04 NOTE — Assessment & Plan Note (Addendum)
Physical today 02/04/18.  PAP 02/04/18.  Mammogram 01/10/18 - benign - recommended f/u in one year.  States had colonoscopy 2018 - Dr Tiffany Kocher. Needs results.  Discussed flu shot.  States she had flu shot 08/2017.

## 2018-02-04 NOTE — Progress Notes (Signed)
[name], female   DOB: 1951-04-11, 67 y.o.   MRN: [id number]   Subjective:    [name], female    DOB: [birth year], 67 y.o.   MRN: [id number]  HPI  Patient with past history of breast cancer s/p right lumpectomy, chemo and XRT and asthma who comes in today to follow up on these issues as well as for a complete physical exam.  She reports she is doing well.  Feels good.  Staying active.  No chest pain.  No sob.  No acid reflux.  No abdominal pain.  Bowels moving.  Some neck pain.  Left posterior neck.  Has been present for a while.  Aggravated by certain positions.  She has been keeping her grandson.  Holding him.  She feels this has aggravated.  Went for massage.  Is better.  Desires not to have any further intervention at this time.  No pain going down arm.  No numbness or tingling.  Handling stress.     Past Medical History:  Diagnosis Date  . [facility]al cell carcinoma    [facility]  . Breast cancer ([place]) 12/13   s/p right lumpectomy, s/p chemo and XRT  . DES exposure in utero   . Hepatitis A   . History of chicken pox   . Hyperactive airway disease   . 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[place])   . Migraines   . Miscarriage    x3   Past Surgical History:  Procedure Laterality Date  . BASAL CELL CARCINOMA EXCISION     Multiple  . BREAST BIOPSY  1975   Cyst  . BREAST LUMPECTOMY WITH AXILLARY LYMPH NODE DISSECTION  01/06/13, 01/22/13   Dr. Drue Dun, [facility], second surgery for pos margin  . CERVICAL FUSION  2001  . DILATION AND CURETTAGE OF UTERUS     x 3  . VAGINAL DELIVERY     x4   Family History  Problem Relation Age of Onset  . Pancreatic cancer Mother   . Cancer Mother        Pancreatic  . Arthritis Mother   . Hyperlipidemia Mother   . Hypertension Mother   . Heart attack Father   . Hyperlipidemia Father   . Hypertension Father   . Heart disease Father 22  . Colon cancer Other   . Cancer Other 50       Breast and  Ovarian- dx 50's/Colon  . Birth defects Maternal Aunt        Breast - in 64's  . Stroke Maternal Grandmother   . Birth defects Maternal Aunt        Breast - in 26's   Social History   Socioeconomic History  . Marital status: Married    Spouse name: None  . Number of children: 4  . Years of education: None  . Highest education level: None  Social Needs  . Financial resource strain: None  . Food insecurity - worry: None  . Food insecurity - inability: None  . Transportation needs - medical: None  . Transportation needs - non-medical: None  Occupational History  . Occupation: Corporate treasurer - [employer]  Tobacco Use  . Smoking status: Never Smoker  . Smokeless tobacco: Never Used  Substance and Sexual Activity  . Alcohol use: Yes    Alcohol/week: 0.0 oz    Comment: 3 times a week  . Drug use: No  . Sexual activity: None  Other Topics Concern  .  None  Social History Narrative   Lives in [place] with husband.  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     Outpatient Encounter Medications as of 02/04/2018  Medication Sig  . Biotin 5000 MCG TABS Take by mouth daily.  . butalbital-aspirin-caffeine (FIORINAL) 50-325-40 MG capsule TAKE ONE CAPSULE TWICE A DAY AS NEEDED FOR HEADACHE  . Calcium-Magnesium-Vitamin D (CALCIUM 500 PO) Take 2 tablets by mouth daily.  Marland Kitchen exemestane (AROMASIN) 25 MG tablet Take 25 mg by mouth daily after breakfast.  . fish oil-omega-3 fatty acids 1000 MG capsule Take 1 g by mouth 2 (two) times daily.  . Glucos-Chond-Sterol-Fish Oil (GLUCOSAMINE CHONDROITIN PLUS PO) Take by mouth daily.  . Glucosamine-Chondroit-Vit C-Mn (GLUCOSAMINE CHONDR 1500 COMPLX) CAPS Take by mouth.  . hydrOXYzine (ATARAX/VISTARIL) 10 MG tablet TAKE ONE TABLET THREE TIMES A DAY AS NEEDED FOR ITCHING  . ipratropium (ATROVENT) 0.06 % nasal spray Place 2 sprays into the nose 3 (three) times daily.  Marland Kitchen LORazepam (ATIVAN) 0.5 MG tablet Take 1 tablet (0.5 mg total) by mouth daily as needed for  anxiety.  . meloxicam (MOBIC) 15 MG tablet Take 1 tablet by mouth daily.  . Multiple Vitamins-Minerals (MULTIVITAMIN WITH MINERALS) tablet Take 1 tablet by mouth daily.  . propranolol (INDERAL) 40 MG tablet Take 1 tablet (40 mg total) by mouth daily.  Marland Kitchen triamcinolone cream (KENALOG) 0.1 %   . Turmeric Curcumin 500 MG CAPS Take by mouth.  . zolpidem (AMBIEN) 5 MG tablet Take 1 tablet (5 mg total) by mouth at bedtime as needed.  . [DISCONTINUED] butalbital-aspirin-caffeine (FIORINAL) 50-325-40 MG capsule TAKE ONE CAPSULE TWICE A DAY AS NEEDED FOR HEADACHE   No facility-administered encounter medications on file as of 02/04/2018.     Review of Systems  Constitutional: Negative for appetite change and unexpected weight change.  HENT: Negative for congestion and sinus pressure.   Eyes: Negative for pain and visual disturbance.  Respiratory: Negative for cough, chest tightness and shortness of breath.   Cardiovascular: Negative for chest pain, palpitations and leg swelling.  Gastrointestinal: Negative for abdominal pain, diarrhea, nausea and vomiting.  Genitourinary: Negative for difficulty urinating and dysuria.  Musculoskeletal: Positive for neck pain. Negative for joint swelling.  Skin: Negative for color change and rash.  Neurological: Negative for dizziness, light-headedness and headaches.  Hematological: Negative for adenopathy. Does not bruise/bleed easily.  Psychiatric/Behavioral: Negative for agitation and dysphoric mood.       Objective:    Physical Exam  Constitutional: She is oriented to person, place, and time. She appears well-developed and well-nourished. No distress.  HENT:  Nose: Nose normal.  Mouth/Throat: Oropharynx is clear and moist.  Eyes: Right eye exhibits no discharge. Left eye exhibits no discharge. No scleral icterus.  Neck: Neck supple. No thyromegaly present.  Cardiovascular: Normal rate and regular rhythm.  Pulmonary/Chest: Breath sounds normal. No  accessory muscle usage. No tachypnea. No respiratory distress. She has no decreased breath sounds. She has no wheezes. She has no rhonchi. Right breast exhibits no inverted nipple, no mass, no nipple discharge and no tenderness (no axillary adenopathy). Left breast exhibits no inverted nipple, no mass, no nipple discharge and no tenderness (no axilarry adenopathy).  Abdominal: Soft. Bowel sounds are normal. There is no tenderness.  Genitourinary:  Genitourinary Comments: Normal external genitalia.  Vaginal vault without lesions.  Atrophy changes noted.  Cervix identified.  Pap smear performed.  Could not appreciate any adnexal masses or tenderness.    Musculoskeletal: She exhibits no edema or tenderness.  Minimal  increased discomfort - with rotation of her head.  Increased minimal discomfort - palpation over left posterior neck.    Lymphadenopathy:    She has no cervical adenopathy.  Neurological: She is alert and oriented to person, place, and time.  Skin: Skin is warm. No rash noted. No erythema.  Psychiatric: She has a normal mood and affect. Her behavior is normal.    BP 116/70 (BP Location: Left Arm, Patient Position: Sitting, Cuff Size: Normal)   Pulse 82   Temp 98.2 F (36.8 C) (Oral)   Resp 18   Wt 147 lb 3.2 oz (66.8 kg)   SpO2 99%   BMI 22.38 kg/m  Wt Readings from Last 3 Encounters:  02/04/18 147 lb 3.2 oz (66.8 kg)  12/06/17 142 lb (64.4 kg)  05/15/17 143 lb 3.2 oz (65 kg)     Lab Results  Component Value Date   WBC 5.2 05/15/2017   HGB 13.2 05/15/2017   HCT 38.9 05/15/2017   PLT 270.0 05/15/2017   GLUCOSE 100 (H) 05/15/2017   CHOL 205 (H) 02/12/2017   TRIG 63.0 02/12/2017   HDL 68.00 02/12/2017   LDLCALC 124 (H) 02/12/2017   ALT 14 05/15/2017   AST 19 05/15/2017   NA 139 05/15/2017   K 4.4 05/15/2017   CL 105 05/15/2017   CREATININE 0.82 05/15/2017   BUN 15 05/15/2017   CO2 27 05/15/2017   TSH 2.58 02/12/2017   MICROALBUR <0.7 11/14/2015    Ct Angio  Chest Aorta W &/or Wo Contrast  Result Date: 05/16/2017 CLINICAL DATA:  Pt c/o mediastinal substernal chest pain intermediate x 2-3 weeks. NKI. Hx Right Breast CA with Lumpectomy, chemo + rad tx's. Former smoker, quit 45 years ago. EXAM: CT ANGIOGRAPHY CHEST WITH CONTRAST TECHNIQUE: Multidetector CT imaging of the chest was performed using the standard protocol during bolus administration of intravenous contrast. Multiplanar CT image reconstructions and MIPs were obtained to evaluate the vascular anatomy. CONTRAST:  100 cc Isovue 370 COMPARISON:  Chest x-ray 10/27/2015 FINDINGS: Cardiovascular: Heart size is normal. No significant coronary artery calcification. No pericardial effusion. The pulmonary arteries are well opacified. There is no acute pulmonary embolus. Normal appearance of the aortic arch and main pulmonary artery. Mediastinum/Nodes: The visualized portion of the thyroid gland has a normal appearance. No mediastinal, hilar, or axillary adenopathy. Lungs/Pleura: Lungs are clear. No pleural effusion or pneumothorax. Upper Abdomen: No acute abnormality. Musculoskeletal: Mild degenerative changes are seen in the mid thoracic spine. Other: Surgical clips are seen in the right axilla and right breast following prior lumpectomy and axillary node dissection. Review of the MIP images confirms the above findings. IMPRESSION: 1. Technically adequate exam showing no acute pulmonary embolus. 2. Postoperative changes following right lumpectomy and axillary node dissection. Electronically Signed   By: Nolon Nations M.D.   On: 05/16/2017 10:06       Assessment & Plan:   Problem List Items Addressed This Visit    Asthma    Breathing stable.        Healthcare maintenance    Physical today 02/04/18.  PAP 02/04/18.  Mammogram 01/10/18 - benign - recommended f/u in one year.  States had colonoscopy 2018 - Dr Tiffany Kocher. Needs results.  Discussed flu shot.  States she had flu shot 08/2017.        Malignant  neoplasm of breast (female) ([place])    On aromasin.  Just had mammogram 01/10/18 - ok.  Follow.       Relevant Orders  TSH   Comprehensive metabolic panel   Neck pain    Neck pain as outlined.  Better with position changes and massage.  Desires no further intervention at this time.  Will notify me.  Discussed xray. Wants to hol.        Other Visit Diagnoses    Screening for cervical cancer    -  Primary   Relevant Orders   Cytology - PAP (Completed)   Screening cholesterol level       Relevant Orders   Lipid panel       Einar Pheasant, MD

## 2018-02-04 NOTE — Assessment & Plan Note (Signed)
On aromasin.  Just had mammogram 01/10/18 - ok.  Follow.

## 2018-02-04 NOTE — Assessment & Plan Note (Signed)
Neck pain as outlined.  Better with position changes and massage.  Desires no further intervention at this time.  Will notify me.  Discussed xray. Wants to hol.

## 2018-02-05 ENCOUNTER — Encounter: Payer: Self-pay | Admitting: Internal Medicine

## 2018-02-05 LAB — CYTOLOGY - PAP
Diagnosis: NEGATIVE
HPV (WINDOPATH): NOT DETECTED

## 2018-02-07 ENCOUNTER — Encounter: Payer: Self-pay | Admitting: Internal Medicine

## 2018-02-12 ENCOUNTER — Other Ambulatory Visit (INDEPENDENT_AMBULATORY_CARE_PROVIDER_SITE_OTHER): Payer: PPO

## 2018-02-12 DIAGNOSIS — Z13220 Encounter for screening for lipoid disorders: Secondary | ICD-10-CM

## 2018-02-12 DIAGNOSIS — C50919 Malignant neoplasm of unspecified site of unspecified female breast: Secondary | ICD-10-CM

## 2018-02-12 DIAGNOSIS — Z170 Estrogen receptor positive status [ER+]: Secondary | ICD-10-CM | POA: Diagnosis not present

## 2018-02-12 LAB — LIPID PANEL
CHOL/HDL RATIO: 3
Cholesterol: 168 mg/dL (ref 0–200)
HDL: 60.6 mg/dL (ref >39.00)
LDL CALC: 96 mg/dL (ref 0–99)
NONHDL: 107.62
Triglycerides: 58 mg/dL (ref 0.0–149.0)
VLDL: 11.6 mg/dL (ref 0.0–40.0)

## 2018-02-12 LAB — TSH: TSH: 5.12 u[IU]/mL — ABNORMAL HIGH (ref 0.35–4.50)

## 2018-02-12 LAB — COMPREHENSIVE METABOLIC PANEL
ALT: 15 U/L (ref 0–35)
AST: 17 U/L (ref 0–37)
Albumin: 4.2 g/dL (ref 3.5–5.2)
Alkaline Phosphatase: 73 U/L (ref 39–117)
BILIRUBIN TOTAL: 0.4 mg/dL (ref 0.2–1.2)
BUN: 14 mg/dL (ref 6–23)
CHLORIDE: 104 meq/L (ref 96–112)
CO2: 30 meq/L (ref 19–32)
Calcium: 9.5 mg/dL (ref 8.4–10.5)
Creatinine, Ser: 0.88 mg/dL (ref 0.40–1.20)
GFR: 68.38 mL/min (ref >60.00)
GLUCOSE: 97 mg/dL (ref 70–99)
Potassium: 3.7 mEq/L (ref 3.5–5.1)
Sodium: 141 mEq/L (ref 135–145)
Total Protein: 7.2 g/dL (ref 6.0–8.3)

## 2018-02-13 ENCOUNTER — Other Ambulatory Visit: Payer: Self-pay | Admitting: Internal Medicine

## 2018-02-13 DIAGNOSIS — R7989 Other specified abnormal findings of blood chemistry: Secondary | ICD-10-CM

## 2018-02-13 NOTE — Progress Notes (Signed)
Order placed for f/u tsh.  

## 2018-02-14 ENCOUNTER — Encounter: Payer: Self-pay | Admitting: *Deleted

## 2018-02-14 ENCOUNTER — Other Ambulatory Visit: Payer: Self-pay | Admitting: Internal Medicine

## 2018-02-14 NOTE — Telephone Encounter (Signed)
Unread mychart message mailed to patient 

## 2018-02-18 ENCOUNTER — Telehealth: Payer: Self-pay | Admitting: Internal Medicine

## 2018-02-18 NOTE — Telephone Encounter (Signed)
Copied from [place]. Topic: Quick Communication - Lab Results >> Feb 18, 2018  9:44 AM Burnis Medin, NT wrote: Patient called to get her lab results. Triage can disclose and would like a call back

## 2018-02-26 DIAGNOSIS — L570 Actinic keratosis: Secondary | ICD-10-CM | POA: Diagnosis not present

## 2018-02-26 DIAGNOSIS — L820 Inflamed seborrheic keratosis: Secondary | ICD-10-CM | POA: Diagnosis not present

## 2018-02-26 DIAGNOSIS — L578 Other skin changes due to chronic exposure to nonionizing radiation: Secondary | ICD-10-CM | POA: Diagnosis not present

## 2018-02-26 DIAGNOSIS — L821 Other seborrheic keratosis: Secondary | ICD-10-CM | POA: Diagnosis not present

## 2018-03-03 ENCOUNTER — Telehealth: Payer: Self-pay | Admitting: Internal Medicine

## 2018-03-03 DIAGNOSIS — M542 Cervicalgia: Secondary | ICD-10-CM

## 2018-03-03 NOTE — Telephone Encounter (Signed)
Order placed for c-spine xray.  Pt may come in to get xray at her convenience.

## 2018-03-03 NOTE — Telephone Encounter (Signed)
Copied from [place] [phone]. Topic: Quick Communication - See Telephone Encounter >> Mar 03, 2018  9:00 AM Bea Graff, NT wrote: CRM for notification. See Telephone encounter for: Pt has tried the massages on her neck and that has not helped her neck  pain and she would like to move forward with getting and xray. She needs order in for this. Please advise.   03/03/18.

## 2018-03-03 NOTE — Telephone Encounter (Signed)
Patient would like to get x-ray done.

## 2018-03-04 NOTE — Telephone Encounter (Signed)
Patient is aware and will come in for x-ray.

## 2018-03-05 ENCOUNTER — Ambulatory Visit (INDEPENDENT_AMBULATORY_CARE_PROVIDER_SITE_OTHER): Payer: PPO

## 2018-03-05 DIAGNOSIS — M47812 Spondylosis without myelopathy or radiculopathy, cervical region: Secondary | ICD-10-CM | POA: Diagnosis not present

## 2018-03-05 DIAGNOSIS — M542 Cervicalgia: Secondary | ICD-10-CM

## 2018-03-06 ENCOUNTER — Encounter: Payer: Self-pay | Admitting: Internal Medicine

## 2018-03-10 ENCOUNTER — Other Ambulatory Visit: Payer: Self-pay | Admitting: Internal Medicine

## 2018-03-10 DIAGNOSIS — M542 Cervicalgia: Secondary | ICD-10-CM

## 2018-03-10 NOTE — Progress Notes (Signed)
Ortho referral placed

## 2018-03-11 ENCOUNTER — Other Ambulatory Visit: Payer: Self-pay

## 2018-03-11 NOTE — Telephone Encounter (Signed)
Spoke with patient regarding refills and referral. Advised patient that referral has been approved and to let us know if she does not hear from [facility]. Patient is requesting refill on Atarax and Ativan.   LOV: 02/04/2018 NOV: 08/05/2018

## 2018-03-11 NOTE — Telephone Encounter (Signed)
I do not mind refilling, just need more info.  Need to confirm with pt that she is not taking ambien with vistaril (hydroxyzine).  Confirm doing ok.  How often does she take lorazepam and hydroxyzine.

## 2018-03-12 MED ORDER — LORAZEPAM 0.5 MG PO TABS: 0.5000 mg | ORAL_TABLET | Freq: Every day | ORAL | 0 refills | Status: DC | PRN

## 2018-03-12 MED ORDER — HYDROXYZINE HCL 10 MG PO TABS: ORAL_TABLET | ORAL | 0 refills | Status: DC

## 2018-03-12 NOTE — Telephone Encounter (Signed)
rx ok'd for lorazepam #30 with no refills and hydroxyzine #30 with no refills.

## 2018-03-12 NOTE — Telephone Encounter (Signed)
Patient is not taking ambien with the vistaril. She rarely takes any of them but the Vistaril helps with her itching. Patient doesn't need ambien refilled at this time.

## 2018-03-13 NOTE — Telephone Encounter (Signed)
Faxed Rx to [facility]

## 2018-03-20 DIAGNOSIS — M47812 Spondylosis without myelopathy or radiculopathy, cervical region: Secondary | ICD-10-CM | POA: Insufficient documentation

## 2018-03-20 DIAGNOSIS — M4692 Unspecified inflammatory spondylopathy, cervical region: Secondary | ICD-10-CM | POA: Diagnosis not present

## 2018-03-24 DIAGNOSIS — M5412 Radiculopathy, cervical region: Secondary | ICD-10-CM | POA: Diagnosis not present

## 2018-03-24 DIAGNOSIS — M542 Cervicalgia: Secondary | ICD-10-CM | POA: Diagnosis not present

## 2018-03-26 DIAGNOSIS — M5412 Radiculopathy, cervical region: Secondary | ICD-10-CM | POA: Diagnosis not present

## 2018-03-26 DIAGNOSIS — M542 Cervicalgia: Secondary | ICD-10-CM | POA: Diagnosis not present

## 2018-03-27 DIAGNOSIS — Z85828 Personal history of other malignant neoplasm of skin: Secondary | ICD-10-CM | POA: Diagnosis not present

## 2018-03-27 DIAGNOSIS — L578 Other skin changes due to chronic exposure to nonionizing radiation: Secondary | ICD-10-CM | POA: Diagnosis not present

## 2018-03-27 DIAGNOSIS — L570 Actinic keratosis: Secondary | ICD-10-CM | POA: Diagnosis not present

## 2018-04-01 DIAGNOSIS — M5412 Radiculopathy, cervical region: Secondary | ICD-10-CM | POA: Diagnosis not present

## 2018-04-01 DIAGNOSIS — M542 Cervicalgia: Secondary | ICD-10-CM | POA: Diagnosis not present

## 2018-04-03 DIAGNOSIS — M5412 Radiculopathy, cervical region: Secondary | ICD-10-CM | POA: Diagnosis not present

## 2018-04-03 DIAGNOSIS — M542 Cervicalgia: Secondary | ICD-10-CM | POA: Diagnosis not present

## 2018-04-07 DIAGNOSIS — M542 Cervicalgia: Secondary | ICD-10-CM | POA: Diagnosis not present

## 2018-04-07 DIAGNOSIS — M5412 Radiculopathy, cervical region: Secondary | ICD-10-CM | POA: Diagnosis not present

## 2018-04-08 ENCOUNTER — Other Ambulatory Visit (INDEPENDENT_AMBULATORY_CARE_PROVIDER_SITE_OTHER): Payer: PPO

## 2018-04-08 DIAGNOSIS — R7989 Other specified abnormal findings of blood chemistry: Secondary | ICD-10-CM | POA: Diagnosis not present

## 2018-04-08 LAB — TSH: TSH: 4.74 u[IU]/mL — ABNORMAL HIGH (ref 0.35–4.50)

## 2018-04-09 ENCOUNTER — Encounter: Payer: Self-pay | Admitting: Internal Medicine

## 2018-04-09 DIAGNOSIS — M542 Cervicalgia: Secondary | ICD-10-CM | POA: Diagnosis not present

## 2018-04-09 DIAGNOSIS — M5412 Radiculopathy, cervical region: Secondary | ICD-10-CM | POA: Diagnosis not present

## 2018-04-14 DIAGNOSIS — M5412 Radiculopathy, cervical region: Secondary | ICD-10-CM | POA: Diagnosis not present

## 2018-04-14 DIAGNOSIS — M542 Cervicalgia: Secondary | ICD-10-CM | POA: Diagnosis not present

## 2018-04-16 DIAGNOSIS — M5412 Radiculopathy, cervical region: Secondary | ICD-10-CM | POA: Diagnosis not present

## 2018-04-16 DIAGNOSIS — M542 Cervicalgia: Secondary | ICD-10-CM | POA: Diagnosis not present

## 2018-04-21 DIAGNOSIS — M5412 Radiculopathy, cervical region: Secondary | ICD-10-CM | POA: Diagnosis not present

## 2018-04-21 DIAGNOSIS — M542 Cervicalgia: Secondary | ICD-10-CM | POA: Diagnosis not present

## 2018-04-24 DIAGNOSIS — M5412 Radiculopathy, cervical region: Secondary | ICD-10-CM | POA: Diagnosis not present

## 2018-04-24 DIAGNOSIS — M542 Cervicalgia: Secondary | ICD-10-CM | POA: Diagnosis not present

## 2018-04-28 DIAGNOSIS — M5412 Radiculopathy, cervical region: Secondary | ICD-10-CM | POA: Diagnosis not present

## 2018-04-28 DIAGNOSIS — M542 Cervicalgia: Secondary | ICD-10-CM | POA: Diagnosis not present

## 2018-05-01 DIAGNOSIS — M542 Cervicalgia: Secondary | ICD-10-CM | POA: Diagnosis not present

## 2018-05-01 DIAGNOSIS — M5412 Radiculopathy, cervical region: Secondary | ICD-10-CM | POA: Diagnosis not present

## 2018-05-05 DIAGNOSIS — M542 Cervicalgia: Secondary | ICD-10-CM | POA: Diagnosis not present

## 2018-05-05 DIAGNOSIS — M5412 Radiculopathy, cervical region: Secondary | ICD-10-CM | POA: Diagnosis not present

## 2018-05-09 DIAGNOSIS — M542 Cervicalgia: Secondary | ICD-10-CM | POA: Diagnosis not present

## 2018-05-09 DIAGNOSIS — M25611 Stiffness of right shoulder, not elsewhere classified: Secondary | ICD-10-CM | POA: Diagnosis not present

## 2018-05-09 DIAGNOSIS — M7712 Lateral epicondylitis, left elbow: Secondary | ICD-10-CM | POA: Diagnosis not present

## 2018-05-09 DIAGNOSIS — M5412 Radiculopathy, cervical region: Secondary | ICD-10-CM | POA: Diagnosis not present

## 2018-05-09 DIAGNOSIS — M25511 Pain in right shoulder: Secondary | ICD-10-CM | POA: Diagnosis not present

## 2018-05-12 DIAGNOSIS — M542 Cervicalgia: Secondary | ICD-10-CM | POA: Diagnosis not present

## 2018-05-12 DIAGNOSIS — M5412 Radiculopathy, cervical region: Secondary | ICD-10-CM | POA: Diagnosis not present

## 2018-07-08 ENCOUNTER — Other Ambulatory Visit: Payer: Self-pay | Admitting: Internal Medicine

## 2018-07-11 ENCOUNTER — Other Ambulatory Visit: Payer: Self-pay | Admitting: Internal Medicine

## 2018-07-15 ENCOUNTER — Other Ambulatory Visit: Payer: Self-pay | Admitting: Internal Medicine

## 2018-08-05 ENCOUNTER — Ambulatory Visit (INDEPENDENT_AMBULATORY_CARE_PROVIDER_SITE_OTHER): Payer: PPO

## 2018-08-05 ENCOUNTER — Encounter: Payer: Self-pay | Admitting: Internal Medicine

## 2018-08-05 ENCOUNTER — Other Ambulatory Visit: Payer: Self-pay | Admitting: Internal Medicine

## 2018-08-05 ENCOUNTER — Ambulatory Visit (INDEPENDENT_AMBULATORY_CARE_PROVIDER_SITE_OTHER): Payer: PPO | Admitting: Internal Medicine

## 2018-08-05 VITALS — BP 120/78 | HR 73 | Temp 98.1°F | Ht 68.0 in | Wt 151.0 lb

## 2018-08-05 DIAGNOSIS — Z170 Estrogen receptor positive status [ER+]: Secondary | ICD-10-CM

## 2018-08-05 DIAGNOSIS — M542 Cervicalgia: Secondary | ICD-10-CM | POA: Diagnosis not present

## 2018-08-05 DIAGNOSIS — J4520 Mild intermittent asthma, uncomplicated: Secondary | ICD-10-CM

## 2018-08-05 DIAGNOSIS — M25551 Pain in right hip: Secondary | ICD-10-CM

## 2018-08-05 DIAGNOSIS — L989 Disorder of the skin and subcutaneous tissue, unspecified: Secondary | ICD-10-CM

## 2018-08-05 DIAGNOSIS — G43809 Other migraine, not intractable, without status migrainosus: Secondary | ICD-10-CM | POA: Diagnosis not present

## 2018-08-05 DIAGNOSIS — F411 Generalized anxiety disorder: Secondary | ICD-10-CM

## 2018-08-05 DIAGNOSIS — C50919 Malignant neoplasm of unspecified site of unspecified female breast: Secondary | ICD-10-CM | POA: Diagnosis not present

## 2018-08-05 DIAGNOSIS — N83202 Unspecified ovarian cyst, left side: Secondary | ICD-10-CM | POA: Diagnosis not present

## 2018-08-05 DIAGNOSIS — K219 Gastro-esophageal reflux disease without esophagitis: Secondary | ICD-10-CM

## 2018-08-05 DIAGNOSIS — R200 Anesthesia of skin: Secondary | ICD-10-CM

## 2018-08-05 MED ORDER — PANTOPRAZOLE SODIUM 40 MG PO TBEC: 40.0000 mg | DELAYED_RELEASE_TABLET | Freq: Every day | ORAL | 2 refills | Status: DC

## 2018-08-05 NOTE — Progress Notes (Signed)
[name], female   DOB: 1951/09/22, 67 y.o.   MRN: [id number]   Subjective:    [name], female    DOB: 02-Jul-1951, 67 y.o.   MRN: [id number]  HPI  Patient here for a scheduled follow up.  She has been seen recently for neck pain.  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  Saw ortho 05/15/18 - cervicalgia - mobic/robaxin.  Recommended PT.  Doing better.  Is having some issues with right hand numbness.  Notices more in the am.  Wakes up, shakes hand - resolves.  Does not notice during the day.  Takes meloxicam 5 of 7 days of the week.  Discussed wearing splints.  Not wearing now.  Does have reflux.  Discussed medication for this.  Discussed taking protonix.  Persistent left neck lesion.  Has appt with dermatology.  Discussed earlier appt.  Is having pain in her right hip.  Persistent.     Past Medical History:  Diagnosis Date  . Basal cell carcinoma    multiple removed/[facility]  . Breast cancer ([place]) 12/13   s/p right lumpectomy, s/p chemo and XRT  . DES exposure in utero   . Hepatitis A   . History of chicken pox   . Hyperactive airway disease   . 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[place])   . Migraines   . Miscarriage    x3   Past Surgical History:  Procedure Laterality Date  . BASAL CELL CARCINOMA EXCISION     Multiple  . BREAST BIOPSY  1975   Cyst  . BREAST LUMPECTOMY WITH AXILLARY LYMPH NODE DISSECTION  01/06/13, 01/22/13   Dr. Drue Dun, [facility], second surgery for pos margin  . CERVICAL FUSION  2001  . DILATION AND CURETTAGE OF UTERUS     x 3  . VAGINAL DELIVERY     x4   Family History  Problem Relation Age of Onset  . Pancreatic cancer Mother   . Cancer Mother        Pancreatic  . Arthritis Mother   . Hyperlipidemia Mother   . Hypertension Mother   . Heart attack Father   . Hyperlipidemia Father   . Hypertension Father   . Heart disease Father 63  . Colon cancer Other   . Cancer Other 50       Breast and Ovarian- dx 50's/Colon  . Birth  defects Maternal Aunt        Breast - in 53's  . Stroke Maternal Grandmother   . Birth defects Maternal Aunt        Breast - in 40's   Social History   Socioeconomic History  . Marital status: Married    Spouse name: Not on file  . Number of children: 4  . Years of education: Not on file  . Highest education level: Not on file  Occupational History  . Occupation: Corporate treasurer - [place]  . Financial resource strain: Not on file  . Food insecurity:    Worry: Not on file    Inability: Not on file  . Transportation needs:    Medical: Not on file    Non-medical: Not on file  Tobacco Use  . Smoking status: Never Smoker  . Smokeless tobacco: Never Used  Substance and Sexual Activity  . Alcohol use: Yes    Alcohol/week: 0.0 standard drinks    Comment: 3 times a week  . Drug use: No  . Sexual activity: Not  on file  Lifestyle  . Physical activity:    Days per week: Not on file    Minutes per session: Not on file  . Stress: Not on file  Relationships  . Social connections:    Talks on phone: Not on file    Gets together: Not on file    Attends religious service: Not on file    Active member of club or organization: Not on file    Attends meetings of clubs or organizations: Not on file    Relationship status: Not on file  Other Topics Concern  . Not on file  Social History Narrative   Lives in [place] with husband.  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     Outpatient Encounter Medications as of 08/05/2018  Medication Sig  . Biotin 5000 MCG TABS Take by mouth daily.  . Calcium-Magnesium-Vitamin D (CALCIUM 500 PO) Take 2 tablets by mouth daily.  Marland Kitchen exemestane (AROMASIN) 25 MG tablet Take 25 mg by mouth daily after breakfast.  . fish oil-omega-3 fatty acids 1000 MG capsule Take 1 g by mouth 2 (two) times daily.  . Glucos-Chond-Sterol-Fish Oil (GLUCOSAMINE CHONDROITIN PLUS PO) Take by mouth daily.  . Glucosamine-Chondroit-Vit C-Mn (GLUCOSAMINE CHONDR 1500 COMPLX)  CAPS Take by mouth.  . hydrOXYzine (ATARAX/VISTARIL) 10 MG tablet TAKE ONE TABLET daily as needed for itching  . ipratropium (ATROVENT) 0.06 % nasal spray PLACE 2 SPRAYS INTO THE NOSE 3 TIMES DAILY  . LORazepam (ATIVAN) 0.5 MG tablet Take 1 tablet (0.5 mg total) by mouth daily as needed for anxiety.  . meloxicam (MOBIC) 15 MG tablet Take 1 tablet by mouth daily.  . Multiple Vitamins-Minerals (MULTIVITAMIN WITH MINERALS) tablet Take 1 tablet by mouth daily.  . propranolol (INDERAL) 40 MG tablet TAKE 1 TABLET DAILY  . triamcinolone cream (KENALOG) 0.1 %   . Turmeric Curcumin 500 MG CAPS Take by mouth.  . zolpidem (AMBIEN) 5 MG tablet Take 1 tablet (5 mg total) by mouth at bedtime as needed.  . [DISCONTINUED] butalbital-aspirin-caffeine (FIORINAL) 50-325-40 MG capsule TAKE 1 CAPSULE BY MOUTH TWICE DAILY AS NEEDED FOR HEADACHE  . pantoprazole (PROTONIX) 40 MG tablet Take 1 tablet (40 mg total) by mouth daily. Take 30 minutes before breakfast   No facility-administered encounter medications on file as of 08/05/2018.     Review of Systems  Constitutional: Negative for appetite change and unexpected weight change.  HENT: Negative for congestion and sinus pressure.   Respiratory: Negative for cough, chest tightness and shortness of breath.   Cardiovascular: Negative for chest pain, palpitations and leg swelling.  Gastrointestinal: Negative for abdominal pain, diarrhea, nausea and vomiting.  Genitourinary: Negative for difficulty urinating and dysuria.  Musculoskeletal: Positive for neck pain. Negative for joint swelling.       Neck pain - has been seeing physical therapy.  Also with right hip pain.    Skin: Negative for color change and rash.  Neurological: Negative for dizziness, light-headedness and headaches.       Right hand numbness.    Psychiatric/Behavioral: Negative for agitation and dysphoric mood.       Objective:    Physical Exam  Constitutional: She appears well-developed and  well-nourished. No distress.  HENT:  Nose: Nose normal.  Mouth/Throat: Oropharynx is clear and moist.  Neck: Neck supple. No thyromegaly present.  Cardiovascular: Normal rate and regular rhythm.  Pulmonary/Chest: Breath sounds normal. No respiratory distress. She has no wheezes.  Abdominal: Soft. Bowel sounds are normal. There is no tenderness.  Musculoskeletal:  She exhibits no edema or tenderness.  Negative phalens and tinels.  Grip strength normal.    Lymphadenopathy:    She has no cervical adenopathy.  Skin: No rash noted. No erythema.  Psychiatric: She has a normal mood and affect. Her behavior is normal.    BP 120/78   Pulse 73   Temp 98.1 F (36.7 C) (Oral)   Ht 5\' 8"  (1.727 m)   Wt 151 lb (68.5 kg)   SpO2 98%   BMI 22.96 kg/m  Wt Readings from Last 3 Encounters:  08/05/18 151 lb (68.5 kg)  02/04/18 147 lb 3.2 oz (66.8 kg)  12/06/17 142 lb (64.4 kg)     Lab Results  Component Value Date   WBC 5.2 05/15/2017   HGB 13.2 05/15/2017   HCT 38.9 05/15/2017   PLT 270.0 05/15/2017   GLUCOSE 97 02/12/2018   CHOL 168 02/12/2018   TRIG 58.0 02/12/2018   HDL 60.60 02/12/2018   LDLCALC 96 02/12/2018   ALT 15 02/12/2018   AST 17 02/12/2018   NA 141 02/12/2018   K 3.7 02/12/2018   CL 104 02/12/2018   CREATININE 0.88 02/12/2018   BUN 14 02/12/2018   CO2 30 02/12/2018   TSH 4.74 (H) 04/08/2018   MICROALBUR <0.7 11/14/2015       Assessment & Plan:   Problem List Items Addressed This Visit    Anxiety state    Stable.        Asthma    Breathing stable.       GERD (gastroesophageal reflux disease)    Reflux as outlined.  Start protonix.  Follow.        Relevant Medications   pantoprazole (PROTONIX) 40 MG tablet   Malignant neoplasm of breast (female) ([place])    On aromasin.  Mammogram 01/10/18 - ok.        Migraines    On propranolol.  Controlled on fiorinal.        Neck pain    Has seen ortho and physical therapy.  Better.  Continues exercise.          Numbness of right hand    Notices when wakes in am.  Does not notice during the day.  Neck is better.  Cock up splint.  Follow.        Ovarian cyst    Previous pelvic ultrasound with cyst.  Will recheck pelvic ultrasound for comparison.        Relevant Orders   US Transvaginal Non-OB   US Pelvis Complete    Other Visit Diagnoses    Right hip pain    -  Primary   Relevant Orders   DG HIP UNILAT WITH PELVIS 2-3 VIEWS RIGHT (Completed)   Skin lesion       Skin lesion of neck.  Pt has appt with Dr Nehemiah Massed.  Request earlier appt.     Relevant Orders   Ambulatory referral to Dermatology       Einar Pheasant, MD

## 2018-08-07 ENCOUNTER — Telehealth: Payer: Self-pay | Admitting: Internal Medicine

## 2018-08-07 NOTE — Telephone Encounter (Signed)
rx [facility]ed [facility]

## 2018-08-07 NOTE — Telephone Encounter (Signed)
Rx has been pended for provider review at office

## 2018-08-07 NOTE — Telephone Encounter (Signed)
rx ok'd for fiorinal #30 with no refills.  rx signed and placed in box.

## 2018-08-07 NOTE — Telephone Encounter (Signed)
Copied from [place] [id number]. Topic: Quick Communication - See Telephone Encounter >> Aug 07, 2018  8:52 AM Mylinda Latina, NT wrote: CRM for notification. See Telephone encounter for: 08/07/18. Arbie Cookey calling from [place] states that the patient needs a refill of her butalbital-aspirin-caffeine [facility]) 50-325-40 MG capsule  [facility] [address] - [place], [place] - [place] [phone] (Phone) [phone] (Fax)

## 2018-08-07 NOTE — Telephone Encounter (Signed)
Last OV 08/05/2018 Next OV 10/01/2018 Last refill 02/04/2018  Showing in our system that rx was filled on 07/09/18. Called [facility] and spoke with the pharmacist, last filled there on 02/04/18 #60 with no refills. Checked database and pt has not filled anywhere else

## 2018-08-07 NOTE — Telephone Encounter (Signed)
Patient is aware 

## 2018-08-10 ENCOUNTER — Encounter: Payer: Self-pay | Admitting: Internal Medicine

## 2018-08-10 DIAGNOSIS — R200 Anesthesia of skin: Secondary | ICD-10-CM | POA: Insufficient documentation

## 2018-08-10 DIAGNOSIS — K219 Gastro-esophageal reflux disease without esophagitis: Secondary | ICD-10-CM | POA: Insufficient documentation

## 2018-08-10 NOTE — Assessment & Plan Note (Signed)
Has seen ortho and physical therapy.  Better.  Continues exercise.

## 2018-08-10 NOTE — Assessment & Plan Note (Signed)
On aromasin.  Mammogram 01/10/18 - ok.

## 2018-08-10 NOTE — Assessment & Plan Note (Signed)
Breathing stable.

## 2018-08-10 NOTE — Assessment & Plan Note (Signed)
Reflux as outlined.  Start protonix.  Follow.  

## 2018-08-10 NOTE — Assessment & Plan Note (Signed)
Stable

## 2018-08-10 NOTE — Assessment & Plan Note (Signed)
Previous pelvic ultrasound with cyst.  Will recheck pelvic ultrasound for comparison.

## 2018-08-10 NOTE — Assessment & Plan Note (Signed)
Notices when wakes in am.  Does not notice during the day.  Neck is better.  Cock up splint.  Follow.

## 2018-08-10 NOTE — Assessment & Plan Note (Signed)
On propranolol.  Controlled on fiorinal.

## 2018-08-20 ENCOUNTER — Ambulatory Visit
Admission: RE | Admit: 2018-08-20 | Discharge: 2018-08-20 | Disposition: A | Discharge status: Home / Self Care | Payer: PPO | Source: Ambulatory Visit | Attending: Internal Medicine | Admitting: Internal Medicine

## 2018-08-20 DIAGNOSIS — N83202 Unspecified ovarian cyst, left side: Secondary | ICD-10-CM | POA: Diagnosis not present

## 2018-08-20 DIAGNOSIS — N83292 Other ovarian cyst, left side: Secondary | ICD-10-CM | POA: Diagnosis not present

## 2018-08-21 ENCOUNTER — Other Ambulatory Visit: Payer: Self-pay | Admitting: Internal Medicine

## 2018-08-21 DIAGNOSIS — N83202 Unspecified ovarian cyst, left side: Secondary | ICD-10-CM

## 2018-08-21 NOTE — Progress Notes (Signed)
Order placed for gyn referral.  

## 2018-08-26 ENCOUNTER — Encounter: Payer: Self-pay | Admitting: Internal Medicine

## 2018-09-08 DIAGNOSIS — L570 Actinic keratosis: Secondary | ICD-10-CM | POA: Diagnosis not present

## 2018-09-08 DIAGNOSIS — L578 Other skin changes due to chronic exposure to nonionizing radiation: Secondary | ICD-10-CM | POA: Diagnosis not present

## 2018-09-08 DIAGNOSIS — Z872 Personal history of diseases of the skin and subcutaneous tissue: Secondary | ICD-10-CM | POA: Diagnosis not present

## 2018-09-09 DIAGNOSIS — Z86010 Personal history of colonic polyps: Secondary | ICD-10-CM | POA: Diagnosis not present

## 2018-09-09 DIAGNOSIS — C50911 Malignant neoplasm of unspecified site of right female breast: Secondary | ICD-10-CM | POA: Diagnosis not present

## 2018-10-01 ENCOUNTER — Ambulatory Visit: Payer: PPO | Admitting: Internal Medicine

## 2018-10-01 ENCOUNTER — Encounter: Payer: Self-pay | Admitting: Internal Medicine

## 2018-10-01 ENCOUNTER — Ambulatory Visit (INDEPENDENT_AMBULATORY_CARE_PROVIDER_SITE_OTHER): Payer: PPO | Admitting: Internal Medicine

## 2018-10-01 DIAGNOSIS — R200 Anesthesia of skin: Secondary | ICD-10-CM | POA: Diagnosis not present

## 2018-10-01 DIAGNOSIS — N83202 Unspecified ovarian cyst, left side: Secondary | ICD-10-CM | POA: Diagnosis not present

## 2018-10-01 DIAGNOSIS — R21 Rash and other nonspecific skin eruption: Secondary | ICD-10-CM

## 2018-10-01 DIAGNOSIS — M8580 Other specified disorders of bone density and structure, unspecified site: Secondary | ICD-10-CM

## 2018-10-01 DIAGNOSIS — C50919 Malignant neoplasm of unspecified site of unspecified female breast: Secondary | ICD-10-CM

## 2018-10-01 DIAGNOSIS — Z170 Estrogen receptor positive status [ER+]: Secondary | ICD-10-CM | POA: Diagnosis not present

## 2018-10-01 DIAGNOSIS — M25552 Pain in left hip: Secondary | ICD-10-CM

## 2018-10-01 MED ORDER — METHYLPREDNISOLONE 4 MG PO TBPK: ORAL_TABLET | ORAL | 0 refills | Status: DC

## 2018-10-01 NOTE — Progress Notes (Signed)
[name], female   DOB: 01/09/1951, 67 y.o.   MRN: [id number]   Subjective:    [name], female    DOB: 01-Jun-1951, 67 y.o.   MRN: [id number]  HPI  Patient here for a scheduled follow up.  She reports she is doing relatively well.  Followed by oncology.  Recently stopped aromasin.  Hs been off for 4 weeks.  Scheduled for bone density 10/2018.  Found to have ovarian cyst.  Needs appt with gyn.  Will f/u with getting this scheduled.  She does report having some increased pain in her left buttock and left lateral hip.  Persistent.  Stretching helps.  Request referral to Dr Earnestine Leys.  Some numbness still in her hands - bilateral.  Persists.  Wearing braces.  Desires no further w/up at this time.  Pain in her left hip/buttock - wakes her up.  Also reports rash - posterior left shoulder and some on low back.  Itches.  No new exposures.     Past Medical History:  Diagnosis Date  . Basal cell carcinoma    multiple removed/[facility]  . Breast cancer ([place]) 12/13   s/p right lumpectomy, s/p chemo and XRT  . DES exposure in utero   . Hepatitis A   . History of chicken pox   . Hyperactive airway disease   . 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[place])   . Migraines   . Miscarriage    x3   Past Surgical History:  Procedure Laterality Date  . BASAL CELL CARCINOMA EXCISION     Multiple  . BREAST BIOPSY  1975   Cyst  . BREAST LUMPECTOMY WITH AXILLARY LYMPH NODE DISSECTION  01/06/13, 01/22/13   Dr. Drue Dun, [facility], second surgery for pos margin  . CERVICAL FUSION  2001  . DILATION AND CURETTAGE OF UTERUS     x 3  . VAGINAL DELIVERY     x4   Family History  Problem Relation Age of Onset  . Pancreatic cancer Mother   . Cancer Mother        Pancreatic  . Arthritis Mother   . Hyperlipidemia Mother   . Hypertension Mother   . Heart attack Father   . Hyperlipidemia Father   . Hypertension Father   . Heart disease Father 70  . Colon cancer Other   .  Cancer Other 50       Breast and Ovarian- dx 50's/Colon  . Birth defects Maternal Aunt        Breast - in 68's  . Stroke Maternal Grandmother   . Birth defects Maternal Aunt        Breast - in 62's   Social History   Socioeconomic History  . Marital status: Married    Spouse name: Not on file  . Number of children: 4  . Years of education: Not on file  . Highest education level: Not on file  Occupational History  . Occupation: Corporate treasurer - [employer]  . Financial resource strain: Not on file  . Food insecurity:    Worry: Not on file    Inability: Not on file  . Transportation needs:    Medical: Not on file    Non-medical: Not on file  Tobacco Use  . Smoking status: Never Smoker  . Smokeless tobacco: Never Used  Substance and Sexual Activity  . Alcohol use: Yes    Alcohol/week: 0.0 standard drinks    Comment: 3  times a week  . Drug use: No  . Sexual activity: Not on file  Lifestyle  . Physical activity:    Days per week: Not on file    Minutes per session: Not on file  . Stress: Not on file  Relationships  . Social connections:    Talks on phone: Not on file    Gets together: Not on file    Attends religious service: Not on file    Active member of club or organization: Not on file    Attends meetings of clubs or organizations: Not on file    Relationship status: Not on file  Other Topics Concern  . Not on file  Social History Narrative   Lives in [place] with husband.  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     Outpatient Encounter Medications as of 10/01/2018  Medication Sig  . Biotin 5000 MCG TABS Take by mouth daily.  . butalbital-aspirin-caffeine (FIORINAL) 50-325-40 MG capsule TAKE 1 CAPSULE BY MOUTH TWICE DAILY AS NEEDED FOR HEADACHE  . Calcium-Magnesium-Vitamin D (CALCIUM 500 PO) Take 2 tablets by mouth daily.  . fish oil-omega-3 fatty acids 1000 MG capsule Take 1 g by mouth 2 (two) times daily.  . Glucos-Chond-Sterol-Fish Oil (GLUCOSAMINE  CHONDROITIN PLUS PO) Take by mouth daily.  . Glucosamine-Chondroit-Vit C-Mn (GLUCOSAMINE CHONDR 1500 COMPLX) CAPS Take by mouth.  . hydrOXYzine (ATARAX/VISTARIL) 10 MG tablet TAKE ONE TABLET daily as needed for itching  . ipratropium (ATROVENT) 0.06 % nasal spray PLACE 2 SPRAYS INTO THE NOSE 3 TIMES DAILY  . LORazepam (ATIVAN) 0.5 MG tablet Take 1 tablet (0.5 mg total) by mouth daily as needed for anxiety.  . meloxicam (MOBIC) 15 MG tablet Take 1 tablet by mouth daily.  . Multiple Vitamins-Minerals (MULTIVITAMIN WITH MINERALS) tablet Take 1 tablet by mouth daily.  . pantoprazole (PROTONIX) 40 MG tablet Take 1 tablet (40 mg total) by mouth daily. Take 30 minutes before breakfast  . propranolol (INDERAL) 40 MG tablet TAKE 1 TABLET DAILY  . triamcinolone cream (KENALOG) 0.1 %   . Turmeric Curcumin 500 MG CAPS Take by mouth.  . zolpidem (AMBIEN) 5 MG tablet Take 1 tablet (5 mg total) by mouth at bedtime as needed.  . [DISCONTINUED] exemestane (AROMASIN) 25 MG tablet Take 25 mg by mouth daily after breakfast.  . methylPREDNISolone (MEDROL DOSEPAK) 4 MG TBPK tablet Medrol dosepack 6 day taper   No facility-administered encounter medications on file as of 10/01/2018.     Review of Systems  Constitutional: Negative for appetite change and unexpected weight change.  HENT: Negative for congestion and sinus pressure.   Respiratory: Negative for cough, chest tightness and shortness of breath.   Cardiovascular: Negative for chest pain, palpitations and leg swelling.  Gastrointestinal: Negative for abdominal pain, diarrhea, nausea and vomiting.  Genitourinary: Negative for difficulty urinating and dysuria.  Musculoskeletal:       Left hip and buttock pain.  Also reports numbness - bilateral hands.    Skin: Positive for rash. Negative for color change.  Neurological: Negative for dizziness, light-headedness and headaches.  Psychiatric/Behavioral: Negative for agitation and dysphoric mood.         Objective:    Physical Exam  Constitutional: She appears well-developed and well-nourished. No distress.  HENT:  Nose: Nose normal.  Mouth/Throat: Oropharynx is clear and moist.  Neck: Neck supple. No thyromegaly present.  Cardiovascular: Normal rate and regular rhythm.  Pulmonary/Chest: No respiratory distress. She has no wheezes.  Abdominal: Soft. Bowel sounds are normal. There  is no tenderness.  Musculoskeletal: She exhibits no edema or tenderness.  Lymphadenopathy:    She has no cervical adenopathy.  Skin: Rash noted.  Erythematous rash - posterior left shoulder and lower back.    Psychiatric: She has a normal mood and affect. Thought content normal.    BP 100/70   Pulse 98   Temp 97.8 F (36.6 C) (Oral)   Resp 17   Wt 148 lb 4 oz (67.2 kg)   SpO2 98%   BMI 22.54 kg/m  Wt Readings from Last 3 Encounters:  10/01/18 148 lb 4 oz (67.2 kg)  08/05/18 151 lb (68.5 kg)  02/04/18 147 lb 3.2 oz (66.8 kg)     Lab Results  Component Value Date   WBC 5.2 05/15/2017   HGB 13.2 05/15/2017   HCT 38.9 05/15/2017   PLT 270.0 05/15/2017   GLUCOSE 97 02/12/2018   CHOL 168 02/12/2018   TRIG 58.0 02/12/2018   HDL 60.60 02/12/2018   LDLCALC 96 02/12/2018   ALT 15 02/12/2018   AST 17 02/12/2018   NA 141 02/12/2018   K 3.7 02/12/2018   CL 104 02/12/2018   CREATININE 0.88 02/12/2018   BUN 14 02/12/2018   CO2 30 02/12/2018   TSH 4.74 (H) 04/08/2018   MICROALBUR <0.7 11/14/2015    US Transvaginal Non-ob  Result Date: 08/20/2018 CLINICAL DATA:  Follow-up examination for left ovarian cyst. EXAM: TRANSABDOMINAL AND TRANSVAGINAL ULTRASOUND OF PELVIS TECHNIQUE: Both transabdominal and transvaginal ultrasound examinations of the pelvis were performed. Transabdominal technique was performed for global imaging of the pelvis including uterus, ovaries, adnexal regions, and pelvic cul-de-sac. It was necessary to proceed with endovaginal exam following the transabdominal exam to visualize  the uterus, endometrium, and ovaries. COMPARISON:  Prior ultrasound from 11/11/2014 FINDINGS: Uterus Measurements: 3.4 x 1.6 x 4.0 cm. No fibroids or other mass visualized. Endometrium Thickness: 4.5 mm.  No focal abnormality visualized. Right ovary Not visualized.  No adnexal mass. Left ovary Measurements: 3.6 x 3.4 x 3.4 cm. Simple unilocular cyst measuring 3.1 x 3.0 x 3.3 cm is seen (previously 2.4 x 2.3 x 2.6 cm). No internal vascularity, solid nodularity, or significant internal complexity. Other findings No abnormal free fluid. IMPRESSION: 1. 3.3 cm left ovarian cyst, increased in size relative to most recent ultrasound from 11/11/2014, previously 2.6 cm. While this is most likely benign, continued surveillance is warranted in this postmenopausal patient. Follow up ultrasound is recommended in 1 year according to the Society of Radiologists in [place] Statement (D Janae Bridgeman al. Management of Asymptomatic Ovarian and Other Adnexal Cysts Imaged at [place]: Society of Radiologists in [place] Statement 2010. Radiology 256 (Sept 2010): 943-954.). 2. Nonvisualization of the right ovary. 3. Normal sonographic appearance of the uterus and endometrium. Electronically Signed   By: Jeannine Boga M.D.   On: 08/20/2018 23:31   US Pelvis Complete  Result Date: 08/20/2018 CLINICAL DATA:  Follow-up examination for left ovarian cyst. EXAM: TRANSABDOMINAL AND TRANSVAGINAL ULTRASOUND OF PELVIS TECHNIQUE: Both transabdominal and transvaginal ultrasound examinations of the pelvis were performed. Transabdominal technique was performed for global imaging of the pelvis including uterus, ovaries, adnexal regions, and pelvic cul-de-sac. It was necessary to proceed with endovaginal exam following the transabdominal exam to visualize the uterus, endometrium, and ovaries. COMPARISON:  Prior ultrasound from 11/11/2014 FINDINGS: Uterus Measurements: 3.4 x 1.6 x 4.0 cm. No fibroids or  other mass visualized. Endometrium Thickness: 4.5 mm.  No focal abnormality visualized. Right ovary Not visualized.  No adnexal mass. Left ovary Measurements: 3.6 x 3.4 x 3.4 cm. Simple unilocular cyst measuring 3.1 x 3.0 x 3.3 cm is seen (previously 2.4 x 2.3 x 2.6 cm). No internal vascularity, solid nodularity, or significant internal complexity. Other findings No abnormal free fluid. IMPRESSION: 1. 3.3 cm left ovarian cyst, increased in size relative to most recent ultrasound from 11/11/2014, previously 2.6 cm. While this is most likely benign, continued surveillance is warranted in this postmenopausal patient. Follow up ultrasound is recommended in 1 year according to the Society of Radiologists in [place] Statement (D Janae Bridgeman al. Management of Asymptomatic Ovarian and Other Adnexal Cysts Imaged at [place]: Society of Radiologists in [place] Statement 2010. Radiology 256 (Sept 2010): 943-954.). 2. Nonvisualization of the right ovary. 3. Normal sonographic appearance of the uterus and endometrium. Electronically Signed   By: Jeannine Boga M.D.   On: 08/20/2018 23:31       Assessment & Plan:   Problem List Items Addressed This Visit    Left hip pain    Persistent left buttock and left hip pain.  Xray 08/05/18 - negative.  Medrol dose pack for rash  - may help with pain in hip.  Given persistence, will refer back to ortho.        Relevant Orders   Ambulatory referral to Orthopedic Surgery   Malignant neoplasm of breast (female) ([place])    Just stopped aromasin.  Off 4 weeks.  Scheduled for f/u bone density.        Relevant Medications   methylPREDNISolone (MEDROL DOSEPAK) 4 MG TBPK tablet   Numbness of right hand    Numbness noticed in both hands.  Shakes - resolves.  Wearing splints.  Desires no further evaluation at this time.        Osteopenia    Has been on aromasin.  Stopped 4 weeks ago.  Scheduled for f/u bone density 10/2018.         Ovarian cyst    Ovarian cyst noted on ultrasound.  Have placed referral to gyn for further evaluation.  Will f/u on getting this scheduled.         Other Visit Diagnoses    Rash       Rash - left posterior shoulder and low back.  Medrol dose pack.  Notify me if does not resolve.         Einar Pheasant, MD

## 2018-10-04 ENCOUNTER — Encounter: Payer: Self-pay | Admitting: Internal Medicine

## 2018-10-04 DIAGNOSIS — M25552 Pain in left hip: Secondary | ICD-10-CM | POA: Insufficient documentation

## 2018-10-04 NOTE — Assessment & Plan Note (Signed)
Ovarian cyst noted on ultrasound.  Have placed referral to gyn for further evaluation.  Will f/u on getting this scheduled.

## 2018-10-04 NOTE — Assessment & Plan Note (Signed)
Has been on aromasin.  Stopped 4 weeks ago.  Scheduled for f/u bone density 10/2018.

## 2018-10-04 NOTE — Assessment & Plan Note (Signed)
Persistent left buttock and left hip pain.  Xray 08/05/18 - negative.  Medrol dose pack for rash  - may help with pain in hip.  Given persistence, will refer back to ortho.

## 2018-10-04 NOTE — Assessment & Plan Note (Signed)
Just stopped aromasin.  Off 4 weeks.  Scheduled for f/u bone density.

## 2018-10-04 NOTE — Assessment & Plan Note (Signed)
Numbness noticed in both hands.  Shakes - resolves.  Wearing splints.  Desires no further evaluation at this time.

## 2018-10-20 DIAGNOSIS — M47816 Spondylosis without myelopathy or radiculopathy, lumbar region: Secondary | ICD-10-CM | POA: Insufficient documentation

## 2018-10-20 DIAGNOSIS — M4727 Other spondylosis with radiculopathy, lumbosacral region: Secondary | ICD-10-CM | POA: Diagnosis not present

## 2018-10-31 DIAGNOSIS — M8580 Other specified disorders of bone density and structure, unspecified site: Secondary | ICD-10-CM | POA: Diagnosis not present

## 2018-10-31 DIAGNOSIS — Z79811 Long term (current) use of aromatase inhibitors: Secondary | ICD-10-CM | POA: Diagnosis not present

## 2018-10-31 DIAGNOSIS — C50911 Malignant neoplasm of unspecified site of right female breast: Secondary | ICD-10-CM | POA: Diagnosis not present

## 2018-11-05 ENCOUNTER — Other Ambulatory Visit: Payer: Self-pay | Admitting: Internal Medicine

## 2018-11-05 ENCOUNTER — Other Ambulatory Visit: Payer: Self-pay

## 2018-11-05 ENCOUNTER — Encounter: Payer: Self-pay | Admitting: Internal Medicine

## 2018-11-05 MED ORDER — BUTALBITAL-ASPIRIN-CAFFEINE 50-325-40 MG PO CAPS: ORAL_CAPSULE | ORAL | 0 refills | Status: DC

## 2018-11-05 NOTE — Telephone Encounter (Signed)
Cindy Arnold, please check on the appt with gyn.  This pt has history of breast cancer and was found to have ovarian cyst on ultrasound.  Would like for gyn to evaluate.  Schedule with Dr Larey Days.  See referral note.

## 2018-11-07 ENCOUNTER — Other Ambulatory Visit: Payer: Self-pay | Admitting: Internal Medicine

## 2018-11-07 DIAGNOSIS — M9903 Segmental and somatic dysfunction of lumbar region: Secondary | ICD-10-CM | POA: Diagnosis not present

## 2018-11-07 DIAGNOSIS — M5431 Sciatica, right side: Secondary | ICD-10-CM | POA: Diagnosis not present

## 2018-11-07 DIAGNOSIS — M25551 Pain in right hip: Secondary | ICD-10-CM | POA: Diagnosis not present

## 2018-11-14 ENCOUNTER — Encounter: Payer: Self-pay | Admitting: Internal Medicine

## 2018-11-14 DIAGNOSIS — M9903 Segmental and somatic dysfunction of lumbar region: Secondary | ICD-10-CM | POA: Diagnosis not present

## 2018-11-14 DIAGNOSIS — M5431 Sciatica, right side: Secondary | ICD-10-CM | POA: Diagnosis not present

## 2018-11-14 DIAGNOSIS — M25551 Pain in right hip: Secondary | ICD-10-CM | POA: Diagnosis not present

## 2018-11-14 MED ORDER — PANTOPRAZOLE SODIUM 20 MG PO TBEC
20.0000 mg | DELAYED_RELEASE_TABLET | Freq: Every day | ORAL | 2 refills | Status: DC
Start: 2018-11-14 — End: 2019-01-13

## 2018-11-14 NOTE — Telephone Encounter (Signed)
rx sent in for protoix 20mg  #30 with 2 refills.

## 2018-11-17 ENCOUNTER — Other Ambulatory Visit: Payer: Self-pay

## 2018-11-17 MED ORDER — ZOLPIDEM TARTRATE 5 MG PO TABS: 5.0000 mg | ORAL_TABLET | Freq: Every evening | ORAL | 1 refills | Status: DC | PRN

## 2018-11-17 NOTE — Telephone Encounter (Signed)
FAXED, PT AWARE

## 2018-11-17 NOTE — Telephone Encounter (Signed)
I have printed a prescription for her Ambien and placed in quick sign for your signature.

## 2018-11-17 NOTE — Telephone Encounter (Signed)
rx signed and placed in box.  Please send to pharmacy.

## 2018-11-28 DIAGNOSIS — M25551 Pain in right hip: Secondary | ICD-10-CM | POA: Diagnosis not present

## 2018-11-28 DIAGNOSIS — M531 Cervicobrachial syndrome: Secondary | ICD-10-CM | POA: Diagnosis not present

## 2018-11-28 DIAGNOSIS — M9901 Segmental and somatic dysfunction of cervical region: Secondary | ICD-10-CM | POA: Diagnosis not present

## 2018-11-28 DIAGNOSIS — M5431 Sciatica, right side: Secondary | ICD-10-CM | POA: Diagnosis not present

## 2018-11-28 DIAGNOSIS — M9903 Segmental and somatic dysfunction of lumbar region: Secondary | ICD-10-CM | POA: Diagnosis not present

## 2018-12-03 DIAGNOSIS — M25551 Pain in right hip: Secondary | ICD-10-CM | POA: Diagnosis not present

## 2018-12-03 DIAGNOSIS — M9901 Segmental and somatic dysfunction of cervical region: Secondary | ICD-10-CM | POA: Diagnosis not present

## 2018-12-03 DIAGNOSIS — M531 Cervicobrachial syndrome: Secondary | ICD-10-CM | POA: Diagnosis not present

## 2018-12-03 DIAGNOSIS — M9903 Segmental and somatic dysfunction of lumbar region: Secondary | ICD-10-CM | POA: Diagnosis not present

## 2018-12-03 DIAGNOSIS — M5431 Sciatica, right side: Secondary | ICD-10-CM | POA: Diagnosis not present

## 2018-12-06 ENCOUNTER — Other Ambulatory Visit: Payer: Self-pay | Admitting: Internal Medicine

## 2018-12-09 DIAGNOSIS — M9901 Segmental and somatic dysfunction of cervical region: Secondary | ICD-10-CM | POA: Diagnosis not present

## 2018-12-09 DIAGNOSIS — M5431 Sciatica, right side: Secondary | ICD-10-CM | POA: Diagnosis not present

## 2018-12-09 DIAGNOSIS — M25551 Pain in right hip: Secondary | ICD-10-CM | POA: Diagnosis not present

## 2018-12-09 DIAGNOSIS — M531 Cervicobrachial syndrome: Secondary | ICD-10-CM | POA: Diagnosis not present

## 2018-12-09 DIAGNOSIS — M9903 Segmental and somatic dysfunction of lumbar region: Secondary | ICD-10-CM | POA: Diagnosis not present

## 2018-12-23 DIAGNOSIS — M9903 Segmental and somatic dysfunction of lumbar region: Secondary | ICD-10-CM | POA: Diagnosis not present

## 2018-12-23 DIAGNOSIS — M5431 Sciatica, right side: Secondary | ICD-10-CM | POA: Diagnosis not present

## 2018-12-30 DIAGNOSIS — M9903 Segmental and somatic dysfunction of lumbar region: Secondary | ICD-10-CM | POA: Diagnosis not present

## 2018-12-30 DIAGNOSIS — M7611 Psoas tendinitis, right hip: Secondary | ICD-10-CM | POA: Diagnosis not present

## 2018-12-30 DIAGNOSIS — M531 Cervicobrachial syndrome: Secondary | ICD-10-CM | POA: Diagnosis not present

## 2018-12-30 DIAGNOSIS — M9901 Segmental and somatic dysfunction of cervical region: Secondary | ICD-10-CM | POA: Diagnosis not present

## 2019-01-13 ENCOUNTER — Other Ambulatory Visit: Payer: Self-pay | Admitting: Internal Medicine

## 2019-01-13 DIAGNOSIS — M531 Cervicobrachial syndrome: Secondary | ICD-10-CM | POA: Diagnosis not present

## 2019-01-13 DIAGNOSIS — M9901 Segmental and somatic dysfunction of cervical region: Secondary | ICD-10-CM | POA: Diagnosis not present

## 2019-01-14 DIAGNOSIS — R922 Inconclusive mammogram: Secondary | ICD-10-CM | POA: Diagnosis not present

## 2019-01-14 DIAGNOSIS — Z853 Personal history of malignant neoplasm of breast: Secondary | ICD-10-CM | POA: Diagnosis not present

## 2019-01-14 DIAGNOSIS — C50911 Malignant neoplasm of unspecified site of right female breast: Secondary | ICD-10-CM | POA: Diagnosis not present

## 2019-01-14 LAB — HM MAMMOGRAPHY

## 2019-01-23 DIAGNOSIS — M7701 Medial epicondylitis, right elbow: Secondary | ICD-10-CM | POA: Diagnosis not present

## 2019-01-23 DIAGNOSIS — G5603 Carpal tunnel syndrome, bilateral upper limbs: Secondary | ICD-10-CM | POA: Insufficient documentation

## 2019-01-23 DIAGNOSIS — M4727 Other spondylosis with radiculopathy, lumbosacral region: Secondary | ICD-10-CM | POA: Diagnosis not present

## 2019-02-03 DIAGNOSIS — M7061 Trochanteric bursitis, right hip: Secondary | ICD-10-CM | POA: Diagnosis not present

## 2019-02-03 DIAGNOSIS — M25551 Pain in right hip: Secondary | ICD-10-CM | POA: Diagnosis not present

## 2019-02-04 ENCOUNTER — Ambulatory Visit (INDEPENDENT_AMBULATORY_CARE_PROVIDER_SITE_OTHER): Payer: PPO | Admitting: Internal Medicine

## 2019-02-04 ENCOUNTER — Encounter: Payer: Self-pay | Admitting: Internal Medicine

## 2019-02-04 VITALS — BP 110/78 | HR 73 | Temp 98.1°F | Wt 141.4 lb

## 2019-02-04 DIAGNOSIS — N83202 Unspecified ovarian cyst, left side: Secondary | ICD-10-CM

## 2019-02-04 DIAGNOSIS — G43809 Other migraine, not intractable, without status migrainosus: Secondary | ICD-10-CM | POA: Diagnosis not present

## 2019-02-04 DIAGNOSIS — J4520 Mild intermittent asthma, uncomplicated: Secondary | ICD-10-CM

## 2019-02-04 DIAGNOSIS — R200 Anesthesia of skin: Secondary | ICD-10-CM | POA: Diagnosis not present

## 2019-02-04 DIAGNOSIS — Z13220 Encounter for screening for lipoid disorders: Secondary | ICD-10-CM | POA: Diagnosis not present

## 2019-02-04 DIAGNOSIS — Z853 Personal history of malignant neoplasm of breast: Secondary | ICD-10-CM

## 2019-02-04 DIAGNOSIS — K219 Gastro-esophageal reflux disease without esophagitis: Secondary | ICD-10-CM | POA: Diagnosis not present

## 2019-02-04 MED ORDER — LORAZEPAM 0.5 MG PO TABS: 0.5000 mg | ORAL_TABLET | Freq: Every day | ORAL | 0 refills | Status: DC | PRN

## 2019-02-04 NOTE — Progress Notes (Signed)
[name], female   DOB: 11/28/1951, 68 y.o.   MRN: [id number]   Subjective:    [name], female    DOB: 06/23/1951, 68 y.o.   MRN: [id number]  HPI  Patient here for a scheduled follow up.  She saw oncology for f/u of her breast cancer 01/14/19.  Competed aromasin 08/2018.  Stable.  Recommended f/u in one year. Has a history of migraine headaches.  Actually reports headaches are less frequent and "better than they used to be".  Takes fioricet prn.  Discussed possibility of rebound headaches.  Discussed trying to limit amount of fioricet.  No sinus pressure.  No chest pain. No sob.  No acid reflux. No abdominal pain.  Bowels moving.  Persistent issues with her right hip and right knee.  Seeing Dr Sabra Heck.  PT.  Also planning to have NCS tomorrow.  S/p cortisone injection - right elbow.  Has not had her appt with gyn.  Apparently had to be rescheduled and states never got a call back to reschedule.  We discussed rescheduling today.    Past Medical History:  Diagnosis Date  . Basal cell carcinoma    multiple removed/ [facility]  . Breast cancer ([place]) 12/13   s/p right lumpectomy, s/p chemo and XRT  . DES exposure in utero   . Hepatitis A   . History of chicken pox   . Hyperactive airway disease   . 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 Migraines   . Miscarriage    x3   Past Surgical History:  Procedure Laterality Date  . BASAL CELL CARCINOMA EXCISION     Multiple  . BREAST BIOPSY  1975   Cyst  . BREAST LUMPECTOMY WITH AXILLARY LYMPH NODE DISSECTION  01/06/13, 01/22/13   Dr. Drue Dun, [facility], second surgery for pos margin  . CERVICAL FUSION  2001  . DILATION AND CURETTAGE OF UTERUS     x 3  . VAGINAL DELIVERY     x4   Family History  Problem Relation Age of Onset  . Pancreatic cancer Mother   . Cancer Mother        Pancreatic  . Arthritis Mother   . Hyperlipidemia Mother   . Hypertension Mother   . Heart attack Father   . Hyperlipidemia  Father   . Hypertension Father   . Heart disease Father 31  . Colon cancer Other   . Cancer Other 50       Breast and Ovarian- dx 50's/Colon  . Birth defects Maternal Aunt        Breast - in 67's  . Stroke Maternal Grandmother   . Birth defects Maternal Aunt        Breast - in 65's   Social History   Socioeconomic History  . Marital status: Married    Spouse name: Not on file  . Number of children: 4  . Years of education: Not on file  . Highest education level: Not on file  Occupational History  . Occupation: Corporate treasurer - [place]  . Financial resource strain: Not on file  . Food insecurity:    Worry: Not on file    Inability: Not on file  . Transportation needs:    Medical: Not on file    Non-medical: Not on file  Tobacco Use  . Smoking status: Never Smoker  . Smokeless tobacco: Never Used  Substance and Sexual Activity  . Alcohol use: Yes  Alcohol/week: 0.0 standard drinks    Comment: 3 times a week  . Drug use: No  . Sexual activity: Not on file  Lifestyle  . Physical activity:    Days per week: Not on file    Minutes per session: Not on file  . Stress: Not on file  Relationships  . Social connections:    Talks on phone: Not on file    Gets together: Not on file    Attends religious service: Not on file    Active member of club or organization: Not on file    Attends meetings of clubs or organizations: Not on file    Relationship status: Not on file  Other Topics Concern  . Not on file  Social History Narrative   Lives in [place] with husband.  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     Outpatient Encounter Medications as of 02/04/2019  Medication Sig  . Biotin 5000 MCG TABS Take by mouth daily.  . butalbital-aspirin-caffeine (FIORINAL) 50-325-40 MG capsule TAKE 1 CAPSULE TWICE DAILY AS NEEDED FORHEADACHE  . Calcium-Magnesium-Vitamin D (CALCIUM 500 PO) Take 2 tablets by mouth daily.  . fish oil-omega-3 fatty acids 1000 MG capsule Take 1  g by mouth 2 (two) times daily.  . Glucos-Chond-Sterol-Fish Oil (GLUCOSAMINE CHONDROITIN PLUS PO) Take by mouth daily.  . Glucosamine-Chondroit-Vit C-Mn (GLUCOSAMINE CHONDR 1500 COMPLX) CAPS Take by mouth.  . hydrOXYzine (ATARAX/VISTARIL) 10 MG tablet TAKE ONE TABLET daily as needed for itching  . ipratropium (ATROVENT) 0.06 % nasal spray PLACE 2 SPRAYS INTO THE NOSE 3 TIMES DAILY  . LORazepam (ATIVAN) 0.5 MG tablet Take 1 tablet (0.5 mg total) by mouth daily as needed for anxiety.  . meloxicam (MOBIC) 15 MG tablet Take 1 tablet by mouth daily.  . methylPREDNISolone (MEDROL DOSEPAK) 4 MG TBPK tablet Medrol dosepack 6 day taper  . Multiple Vitamins-Minerals (MULTIVITAMIN WITH MINERALS) tablet Take 1 tablet by mouth daily.  . pantoprazole (PROTONIX) 20 MG tablet TAKE 1 TABLET BY MOUTH DAILY  . propranolol (INDERAL) 40 MG tablet TAKE 1 TABLET DAILY  . triamcinolone cream (KENALOG) 0.1 %   . Turmeric Curcumin 500 MG CAPS Take by mouth.  . zolpidem (AMBIEN) 5 MG tablet Take 1 tablet (5 mg total) by mouth at bedtime as needed.  . [DISCONTINUED] LORazepam (ATIVAN) 0.5 MG tablet Take 1 tablet (0.5 mg total) by mouth daily as needed for anxiety.   No facility-administered encounter medications on file as of 02/04/2019.     Review of Systems  Constitutional: Negative for appetite change and unexpected weight change.  HENT: Negative for congestion and sinus pressure.   Respiratory: Negative for cough, chest tightness and shortness of breath.   Cardiovascular: Negative for chest pain, palpitations and leg swelling.  Gastrointestinal: Negative for abdominal pain, diarrhea, nausea and vomiting.  Genitourinary: Negative for difficulty urinating and dysuria.  Musculoskeletal: Negative for joint swelling.       Right hip and knee pain as outlined.    Skin: Negative for color change and rash.  Neurological: Positive for headaches. Negative for dizziness and light-headedness.  Psychiatric/Behavioral:  Negative for agitation and dysphoric mood.       Objective:    Physical Exam Constitutional:      General: She is not in acute distress.    Appearance: Normal appearance.  HENT:     Nose: Nose normal. No congestion.     Mouth/Throat:     Pharynx: No oropharyngeal exudate or posterior oropharyngeal erythema.  Neck:  Musculoskeletal: Neck supple. No muscular tenderness.     Thyroid: No thyromegaly.  Cardiovascular:     Rate and Rhythm: Normal rate and regular rhythm.  Pulmonary:     Effort: No respiratory distress.     Breath sounds: Normal breath sounds. No wheezing.  Abdominal:     General: Bowel sounds are normal.     Palpations: Abdomen is soft.     Tenderness: There is no abdominal tenderness.  Musculoskeletal:        General: No swelling or tenderness.  Lymphadenopathy:     Cervical: No cervical adenopathy.  Skin:    Findings: No erythema or rash.  Neurological:     Mental Status: She is alert.  Psychiatric:        Mood and Affect: Mood normal.        Behavior: Behavior normal.     BP 110/78   Pulse 73   Temp 98.1 F (36.7 C) (Oral)   Wt 141 lb 6.4 oz (64.1 kg)   SpO2 98%   BMI 21.50 kg/m  Wt Readings from Last 3 Encounters:  02/04/19 141 lb 6.4 oz (64.1 kg)  10/01/18 148 lb 4 oz (67.2 kg)  08/05/18 151 lb (68.5 kg)     Lab Results  Component Value Date   WBC 5.2 05/15/2017   HGB 13.2 05/15/2017   HCT 38.9 05/15/2017   PLT 270.0 05/15/2017   GLUCOSE 97 02/12/2018   CHOL 168 02/12/2018   TRIG 58.0 02/12/2018   HDL 60.60 02/12/2018   LDLCALC 96 02/12/2018   ALT 15 02/12/2018   AST 17 02/12/2018   NA 141 02/12/2018   K 3.7 02/12/2018   CL 104 02/12/2018   CREATININE 0.88 02/12/2018   BUN 14 02/12/2018   CO2 30 02/12/2018   TSH 4.74 (H) 04/08/2018   MICROALBUR <0.7 11/14/2015    US Transvaginal Non-ob  Result Date: 08/20/2018 CLINICAL DATA:  Follow-up examination for left ovarian cyst. EXAM: TRANSABDOMINAL AND TRANSVAGINAL ULTRASOUND  OF PELVIS TECHNIQUE: Both transabdominal and transvaginal ultrasound examinations of the pelvis were performed. Transabdominal technique was performed for global imaging of the pelvis including uterus, ovaries, adnexal regions, and pelvic cul-de-sac. It was necessary to proceed with endovaginal exam following the transabdominal exam to visualize the uterus, endometrium, and ovaries. COMPARISON:  Prior ultrasound from 11/11/2014 FINDINGS: Uterus Measurements: 3.4 x 1.6 x 4.0 cm. No fibroids or other mass visualized. Endometrium Thickness: 4.5 mm.  No focal abnormality visualized. Right ovary Not visualized.  No adnexal mass. Left ovary Measurements: 3.6 x 3.4 x 3.4 cm. Simple unilocular cyst measuring 3.1 x 3.0 x 3.3 cm is seen (previously 2.4 x 2.3 x 2.6 cm). No internal vascularity, solid nodularity, or significant internal complexity. Other findings No abnormal free fluid. IMPRESSION: 1. 3.3 cm left ovarian cyst, increased in size relative to most recent ultrasound from 11/11/2014, previously 2.6 cm. While this is most likely benign, continued surveillance is warranted in this postmenopausal patient. Follow up ultrasound is recommended in 1 year according to the Society of Radiologists in [place] Statement (D Janae Bridgeman al. Management of Asymptomatic Ovarian and Other Adnexal Cysts Imaged at [place]: Society of Radiologists in [place] Statement 2010. Radiology 256 (Sept 2010): 943-954.). 2. Nonvisualization of the right ovary. 3. Normal sonographic appearance of the uterus and endometrium. Electronically Signed   By: Jeannine Boga M.D.   On: 08/20/2018 23:31   US Pelvis Complete  Result Date: 08/20/2018 CLINICAL DATA:  Follow-up examination for  left ovarian cyst. EXAM: TRANSABDOMINAL AND TRANSVAGINAL ULTRASOUND OF PELVIS TECHNIQUE: Both transabdominal and transvaginal ultrasound examinations of the pelvis were performed. Transabdominal technique was  performed for global imaging of the pelvis including uterus, ovaries, adnexal regions, and pelvic cul-de-sac. It was necessary to proceed with endovaginal exam following the transabdominal exam to visualize the uterus, endometrium, and ovaries. COMPARISON:  Prior ultrasound from 11/11/2014 FINDINGS: Uterus Measurements: 3.4 x 1.6 x 4.0 cm. No fibroids or other mass visualized. Endometrium Thickness: 4.5 mm.  No focal abnormality visualized. Right ovary Not visualized.  No adnexal mass. Left ovary Measurements: 3.6 x 3.4 x 3.4 cm. Simple unilocular cyst measuring 3.1 x 3.0 x 3.3 cm is seen (previously 2.4 x 2.3 x 2.6 cm). No internal vascularity, solid nodularity, or significant internal complexity. Other findings No abnormal free fluid. IMPRESSION: 1. 3.3 cm left ovarian cyst, increased in size relative to most recent ultrasound from 11/11/2014, previously 2.6 cm. While this is most likely benign, continued surveillance is warranted in this postmenopausal patient. Follow up ultrasound is recommended in 1 year according to the Society of Radiologists in [place] Statement (D Janae Bridgeman al. Management of Asymptomatic Ovarian and Other Adnexal Cysts Imaged at [place]: Society of Radiologists in [place] Statement 2010. Radiology 256 (Sept 2010): 943-954.). 2. Nonvisualization of the right ovary. 3. Normal sonographic appearance of the uterus and endometrium. Electronically Signed   By: Jeannine Boga M.D.   On: 08/20/2018 23:31       Assessment & Plan:   Problem List Items Addressed This Visit    Asthma    Breathing stable.       GERD (gastroesophageal reflux disease)    No upper symptoms reported today.       History of breast cancer    Just stopped aromasin 08/2018.  Seeing oncology.  Just evaluated 12/2018.  Recommended f/u in one year.        Relevant Orders   CBC with Differential/Platelet   Hepatic function panel   TSH   Basic metabolic panel     Migraines    On propranolol.  She feels headaches are better.  Discussed possible rebound headaches with fiorinol and discussed trying to limit amount.  Follow.        Numbness of right hand    Planning for NCS tomorrow.        Ovarian cyst    Ovarian cyst noted on ultrasound.  Needs gyn follow up.        Other Visit Diagnoses    Screening cholesterol level    -  Primary   Relevant Orders   Lipid panel       Einar Pheasant, MD

## 2019-02-05 DIAGNOSIS — G5603 Carpal tunnel syndrome, bilateral upper limbs: Secondary | ICD-10-CM | POA: Diagnosis not present

## 2019-02-09 ENCOUNTER — Encounter: Payer: Self-pay | Admitting: Internal Medicine

## 2019-02-09 DIAGNOSIS — M25551 Pain in right hip: Secondary | ICD-10-CM | POA: Diagnosis not present

## 2019-02-09 DIAGNOSIS — M7061 Trochanteric bursitis, right hip: Secondary | ICD-10-CM | POA: Diagnosis not present

## 2019-02-10 ENCOUNTER — Encounter: Payer: Self-pay | Admitting: Internal Medicine

## 2019-02-10 MED ORDER — OSELTAMIVIR PHOSPHATE 75 MG PO CAPS: 75.0000 mg | ORAL_CAPSULE | Freq: Every day | ORAL | 0 refills | Status: DC

## 2019-02-10 NOTE — Assessment & Plan Note (Addendum)
On propranolol.  She feels headaches are better.  Discussed possible rebound headaches with fiorinol and discussed trying to limit amount.  Follow.

## 2019-02-10 NOTE — Assessment & Plan Note (Signed)
Just stopped aromasin 08/2018.  Seeing oncology.  Just evaluated 12/2018.  Recommended f/u in one year.

## 2019-02-10 NOTE — Assessment & Plan Note (Signed)
Planning for NCS tomorrow.

## 2019-02-10 NOTE — Assessment & Plan Note (Signed)
Breathing stable.

## 2019-02-10 NOTE — Assessment & Plan Note (Signed)
Ovarian cyst noted on ultrasound.  Needs gyn follow up.

## 2019-02-10 NOTE — Assessment & Plan Note (Signed)
No upper symptoms reported today.

## 2019-02-10 NOTE — Telephone Encounter (Signed)
Spoke to pt.  She is not having any acute flu  Symptoms.  Is in direct contact with her grandchildren keeping them, etc.  rx sent in for tamiflu prophylaxis.  Any problems she is to call or be evaluated.

## 2019-02-11 ENCOUNTER — Other Ambulatory Visit: Payer: Self-pay

## 2019-02-11 ENCOUNTER — Encounter: Payer: Self-pay | Admitting: Internal Medicine

## 2019-02-11 MED ORDER — PANTOPRAZOLE SODIUM 20 MG PO TBEC: 20.0000 mg | DELAYED_RELEASE_TABLET | Freq: Every day | ORAL | 2 refills | Status: DC

## 2019-02-12 ENCOUNTER — Encounter: Payer: Self-pay | Admitting: Internal Medicine

## 2019-02-12 ENCOUNTER — Other Ambulatory Visit (INDEPENDENT_AMBULATORY_CARE_PROVIDER_SITE_OTHER): Payer: PPO

## 2019-02-12 DIAGNOSIS — Z853 Personal history of malignant neoplasm of breast: Secondary | ICD-10-CM | POA: Diagnosis not present

## 2019-02-12 DIAGNOSIS — Z13220 Encounter for screening for lipoid disorders: Secondary | ICD-10-CM | POA: Diagnosis not present

## 2019-02-12 LAB — LIPID PANEL
Cholesterol: 175 mg/dL (ref 0–200)
HDL: 65.8 mg/dL (ref >39.00)
LDL Cholesterol: 95 mg/dL (ref 0–99)
NonHDL: 109.13
Total CHOL/HDL Ratio: 3
Triglycerides: 69 mg/dL (ref 0.0–149.0)
VLDL: 13.8 mg/dL (ref 0.0–40.0)

## 2019-02-12 LAB — CBC WITH DIFFERENTIAL/PLATELET
Basophils Absolute: 0.1 10*3/uL (ref 0.0–0.1)
Basophils Relative: 1.2 % (ref 0.0–3.0)
Eosinophils Absolute: 0.3 10*3/uL (ref 0.0–0.7)
Eosinophils Relative: 3.5 % (ref 0.0–5.0)
HCT: 39.3 % (ref 36.0–46.0)
Hemoglobin: 13.6 g/dL (ref 12.0–15.0)
Lymphocytes Relative: 17.9 % (ref 12.0–46.0)
Lymphs Abs: 1.7 10*3/uL (ref 0.7–4.0)
MCHC: 34.6 g/dL (ref 30.0–36.0)
MCV: 93.1 fl (ref 78.0–100.0)
MONO ABS: 0.8 10*3/uL (ref 0.1–1.0)
Monocytes Relative: 9 % (ref 3.0–12.0)
NEUTROS ABS: 6.4 10*3/uL (ref 1.4–7.7)
Neutrophils Relative %: 68.4 % (ref 43.0–77.0)
PLATELETS: 263 10*3/uL (ref 150.0–400.0)
RBC: 4.22 Mil/uL (ref 3.87–5.11)
RDW: 12.3 % (ref 11.5–15.5)
WBC: 9.4 10*3/uL (ref 4.0–10.5)

## 2019-02-12 LAB — TSH: TSH: 3.39 u[IU]/mL (ref 0.35–4.50)

## 2019-02-12 LAB — HEPATIC FUNCTION PANEL
ALT: 20 U/L (ref 0–35)
AST: 18 U/L (ref 0–37)
Albumin: 4.3 g/dL (ref 3.5–5.2)
Alkaline Phosphatase: 76 U/L (ref 39–117)
BILIRUBIN DIRECT: 0.1 mg/dL (ref 0.0–0.3)
Total Bilirubin: 0.5 mg/dL (ref 0.2–1.2)
Total Protein: 7 g/dL (ref 6.0–8.3)

## 2019-02-12 LAB — BASIC METABOLIC PANEL
BUN: 14 mg/dL (ref 6–23)
CALCIUM: 9.3 mg/dL (ref 8.4–10.5)
CO2: 30 mEq/L (ref 19–32)
Chloride: 103 mEq/L (ref 96–112)
Creatinine, Ser: 0.88 mg/dL (ref 0.40–1.20)
GFR: 64.14 mL/min (ref >60.00)
Glucose, Bld: 85 mg/dL (ref 70–99)
Potassium: 3.7 mEq/L (ref 3.5–5.1)
Sodium: 139 mEq/L (ref 135–145)

## 2019-02-13 DIAGNOSIS — G5603 Carpal tunnel syndrome, bilateral upper limbs: Secondary | ICD-10-CM | POA: Diagnosis not present

## 2019-02-13 DIAGNOSIS — M25551 Pain in right hip: Secondary | ICD-10-CM | POA: Diagnosis not present

## 2019-02-13 DIAGNOSIS — M7061 Trochanteric bursitis, right hip: Secondary | ICD-10-CM | POA: Diagnosis not present

## 2019-02-25 ENCOUNTER — Encounter: Payer: Self-pay | Admitting: Internal Medicine

## 2019-02-25 ENCOUNTER — Telehealth: Payer: Self-pay

## 2019-02-25 NOTE — Telephone Encounter (Signed)
Sent to PCP to advised let me know if an appt is needed.   Thanks

## 2019-02-25 NOTE — Telephone Encounter (Signed)
If persistent symptoms, she needs to be seen.  Since I am going to be out most of next week and she leaves at the end of the week, I can work her in Friday during lunch - 12:30 02/27/19

## 2019-02-25 NOTE — Telephone Encounter (Signed)
Copied from [place] [id number]. Topic: Appointment Scheduling - Scheduling Inquiry for Clinic >> Feb 25, 2019  3:26 PM Sheran Luz wrote: Reason for CRM: Patient called back regarding mychart message from 3/4- She states she can come in to see Dr. Nicki Reaper on 3/6 at 12:30. Unable to schedule.

## 2019-02-26 ENCOUNTER — Ambulatory Visit (INDEPENDENT_AMBULATORY_CARE_PROVIDER_SITE_OTHER): Payer: PPO | Admitting: Internal Medicine

## 2019-02-26 ENCOUNTER — Encounter: Payer: Self-pay | Admitting: Internal Medicine

## 2019-02-26 DIAGNOSIS — R059 Cough, unspecified: Secondary | ICD-10-CM

## 2019-02-26 DIAGNOSIS — R05 Cough: Secondary | ICD-10-CM

## 2019-02-26 MED ORDER — ALBUTEROL SULFATE HFA 108 (90 BASE) MCG/ACT IN AERS: 2.0000 | INHALATION_SPRAY | Freq: Four times a day (QID) | RESPIRATORY_TRACT | 0 refills | Status: DC | PRN

## 2019-02-26 MED ORDER — AZITHROMYCIN 250 MG PO TABS: ORAL_TABLET | ORAL | 0 refills | Status: DC

## 2019-02-26 MED ORDER — PREDNISONE 10 MG PO TABS: ORAL_TABLET | ORAL | 0 refills | Status: DC

## 2019-02-26 NOTE — Telephone Encounter (Signed)
Pt called to tell Larena Glassman the new time will work and she will be here at 12:00pm today.

## 2019-02-26 NOTE — Telephone Encounter (Signed)
Spoke with pt. I have put her on the schedule

## 2019-02-26 NOTE — Progress Notes (Signed)
[name], female   DOB: [birth year], 68 y.o.   MRN: [id number]   Subjective:    [name], female    DOB: 1951-10-19, 68 y.o.   MRN: [id number]  HPI  Patient here as a work in with concerns regarding increased cough and congestion.  She reports that she initially developed a scratchy throat.  Has progressed.  Some minimal nasal congestion.  Now with cough.  Persistent intermittent cough.  Some coughing fits.  Non productive.  Some question of wheezing.  Using her inhaler.  Helps.  No fever.  No recent travel.  Planning to go to [place] end of next week.  No sinus pressure.  No drainage.  Has been flushing her nose.  Taking tessalon perles.  Also taking mucinex.  Cough persists.  No nausea or vomiting.  Eating.  No body aches.  Has taken prednisone previously and tolerated.     Past Medical History:  Diagnosis Date  . Basal cell carcinoma    multiple removed/[facility]  . Breast cancer ([place]) 12/13   s/p right lumpectomy, s/p chemo and XRT  . DES exposure in utero   . Hepatitis A   . History of chicken pox   . Hyperactive airway disease   . 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[place])   . Migraines   . Miscarriage    x3   Past Surgical History:  Procedure Laterality Date  . BASAL CELL CARCINOMA EXCISION     Multiple  . BREAST BIOPSY  1975   Cyst  . BREAST LUMPECTOMY WITH AXILLARY LYMPH NODE DISSECTION  01/06/13, 01/22/13   Dr. Drue Dun, [facility], second surgery for pos margin  . CERVICAL FUSION  2001  . DILATION AND CURETTAGE OF UTERUS     x 3  . VAGINAL DELIVERY     x4   Family History  Problem Relation Age of Onset  . Pancreatic cancer Mother   . Cancer Mother        Pancreatic  . Arthritis Mother   . Hyperlipidemia Mother   . Hypertension Mother   . Heart attack Father   . Hyperlipidemia Father   . Hypertension Father   . Heart disease Father 44  . Colon cancer Other   . Cancer Other 50       Breast and Ovarian- dx 50's/Colon  . Birth  defects Maternal Aunt        Breast - in 39's  . Stroke Maternal Grandmother   . Birth defects Maternal Aunt        Breast - in 35's   Social History   Socioeconomic History  . Marital status: Married    Spouse name: Not on file  . Number of children: 4  . Years of education: Not on file  . Highest education level: Not on file  Occupational History  . Occupation: Corporate treasurer - [place]  . Financial resource strain: Not on file  . Food insecurity:    Worry: Not on file    Inability: Not on file  . Transportation needs:    Medical: Not on file    Non-medical: Not on file  Tobacco Use  . Smoking status: Never Smoker  . Smokeless tobacco: Never Used  Substance and Sexual Activity  . Alcohol use: Yes    Alcohol/week: 0.0 standard drinks    Comment: 3 times a week  . Drug use: No  . Sexual activity: Not on file  Lifestyle  .  Physical activity:    Days per week: Not on file    Minutes per session: Not on file  . Stress: Not on file  Relationships  . Social connections:    Talks on phone: Not on file    Gets together: Not on file    Attends religious service: Not on file    Active member of club or organization: Not on file    Attends meetings of clubs or organizations: Not on file    Relationship status: Not on file  Other Topics Concern  . Not on file  Social History Narrative   Lives in [place] with husband.  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     Outpatient Encounter Medications as of 02/26/2019  Medication Sig  . albuterol (PROVENTIL HFA;VENTOLIN HFA) 108 (90 Base) MCG/ACT inhaler Inhale 2 puffs into the lungs every 6 (six) hours as needed for wheezing or shortness of breath.  Marland Kitchen azithromycin (ZITHROMAX) 250 MG tablet Take 2 tablets x 1 day and then 1 tablet per day for four more days.  . Biotin 5000 MCG TABS Take by mouth daily.  . butalbital-aspirin-caffeine (FIORINAL) 50-325-40 MG capsule TAKE 1 CAPSULE TWICE DAILY AS NEEDED FORHEADACHE  .  Calcium-Magnesium-Vitamin D (CALCIUM 500 PO) Take 2 tablets by mouth daily.  . fish oil-omega-3 fatty acids 1000 MG capsule Take 1 g by mouth 2 (two) times daily.  . Glucos-Chond-Sterol-Fish Oil (GLUCOSAMINE CHONDROITIN PLUS PO) Take by mouth daily.  . Glucosamine-Chondroit-Vit C-Mn (GLUCOSAMINE CHONDR 1500 COMPLX) CAPS Take by mouth.  . hydrOXYzine (ATARAX/VISTARIL) 10 MG tablet TAKE ONE TABLET daily as needed for itching  . ipratropium (ATROVENT) 0.06 % nasal spray PLACE 2 SPRAYS INTO THE NOSE 3 TIMES DAILY  . LORazepam (ATIVAN) 0.5 MG tablet Take 1 tablet (0.5 mg total) by mouth daily as needed for anxiety.  . meloxicam (MOBIC) 15 MG tablet Take 1 tablet by mouth daily.  . Multiple Vitamins-Minerals (MULTIVITAMIN WITH MINERALS) tablet Take 1 tablet by mouth daily.  . pantoprazole (PROTONIX) 20 MG tablet Take 1 tablet (20 mg total) by mouth daily.  . predniSONE (DELTASONE) 10 MG tablet Take 6 tablets x 1 day and then decrease by 1/2 tablet per day until down to zero mg.  . propranolol (INDERAL) 40 MG tablet TAKE 1 TABLET DAILY  . triamcinolone cream (KENALOG) 0.1 %   . Turmeric Curcumin 500 MG CAPS Take by mouth.  . zolpidem (AMBIEN) 5 MG tablet Take 1 tablet (5 mg total) by mouth at bedtime as needed.  . [DISCONTINUED] methylPREDNISolone (MEDROL DOSEPAK) 4 MG TBPK tablet Medrol dosepack 6 day taper  . [DISCONTINUED] oseltamivir (TAMIFLU) 75 MG capsule Take 1 capsule (75 mg total) by mouth daily.   No facility-administered encounter medications on file as of 02/26/2019.     Review of Systems  Constitutional: Negative for appetite change, fever and unexpected weight change.  HENT: Positive for congestion. Negative for sinus pressure.   Respiratory: Positive for cough. Negative for chest tightness and shortness of breath.   Cardiovascular: Negative for chest pain and leg swelling.  Gastrointestinal: Negative for diarrhea, nausea and vomiting.  Musculoskeletal: Negative for joint swelling and  myalgias.  Skin: Negative for color change and rash.  Neurological: Negative for dizziness and headaches.  Psychiatric/Behavioral: Negative for agitation and dysphoric mood.       Objective:    Physical Exam Constitutional:      General: She is not in acute distress.    Appearance: Normal appearance.  HENT:  Nose: Nose normal. No congestion.     Mouth/Throat:     Pharynx: No oropharyngeal exudate or posterior oropharyngeal erythema.  Neck:     Musculoskeletal: Neck supple. No muscular tenderness.     Thyroid: No thyromegaly.  Cardiovascular:     Rate and Rhythm: Normal rate and regular rhythm.  Pulmonary:     Effort: No respiratory distress.     Breath sounds: Normal breath sounds. No wheezing.     Comments: Some increased cough with forced expiration.   Musculoskeletal:        General: No swelling.  Lymphadenopathy:     Cervical: No cervical adenopathy.  Skin:    Findings: No erythema or rash.  Neurological:     Mental Status: She is alert.  Psychiatric:        Mood and Affect: Mood normal.        Behavior: Behavior normal.     BP 110/68   Pulse 67   Temp 97.9 F (36.6 C) (Oral)   Resp 16   Wt 146 lb 9.6 oz (66.5 kg)   SpO2 98%   BMI 22.29 kg/m  Wt Readings from Last 3 Encounters:  02/26/19 146 lb 9.6 oz (66.5 kg)  02/04/19 141 lb 6.4 oz (64.1 kg)  10/01/18 148 lb 4 oz (67.2 kg)     Lab Results  Component Value Date   WBC 9.4 02/12/2019   HGB 13.6 02/12/2019   HCT 39.3 02/12/2019   PLT 263.0 02/12/2019   GLUCOSE 85 02/12/2019   CHOL 175 02/12/2019   TRIG 69.0 02/12/2019   HDL 65.80 02/12/2019   LDLCALC 95 02/12/2019   ALT 20 02/12/2019   AST 18 02/12/2019   NA 139 02/12/2019   K 3.7 02/12/2019   CL 103 02/12/2019   CREATININE 0.88 02/12/2019   BUN 14 02/12/2019   CO2 30 02/12/2019   TSH 3.39 02/12/2019   MICROALBUR <0.7 11/14/2015    US Transvaginal Non-ob  Result Date: 08/20/2018 CLINICAL DATA:  Follow-up examination for left  ovarian cyst. EXAM: TRANSABDOMINAL AND TRANSVAGINAL ULTRASOUND OF PELVIS TECHNIQUE: Both transabdominal and transvaginal ultrasound examinations of the pelvis were performed. Transabdominal technique was performed for global imaging of the pelvis including uterus, ovaries, adnexal regions, and pelvic cul-de-sac. It was necessary to proceed with endovaginal exam following the transabdominal exam to visualize the uterus, endometrium, and ovaries. COMPARISON:  Prior ultrasound from 11/11/2014 FINDINGS: Uterus Measurements: 3.4 x 1.6 x 4.0 cm. No fibroids or other mass visualized. Endometrium Thickness: 4.5 mm.  No focal abnormality visualized. Right ovary Not visualized.  No adnexal mass. Left ovary Measurements: 3.6 x 3.4 x 3.4 cm. Simple unilocular cyst measuring 3.1 x 3.0 x 3.3 cm is seen (previously 2.4 x 2.3 x 2.6 cm). No internal vascularity, solid nodularity, or significant internal complexity. Other findings No abnormal free fluid. IMPRESSION: 1. 3.3 cm left ovarian cyst, increased in size relative to most recent ultrasound from 11/11/2014, previously 2.6 cm. While this is most likely benign, continued surveillance is warranted in this postmenopausal patient. Follow up ultrasound is recommended in 1 year according to the Society of Radiologists in [place] Statement (D Janae Bridgeman al. Management of Asymptomatic Ovarian and Other Adnexal Cysts Imaged at [place]: Society of Radiologists in [place] Statement 2010. Radiology 256 (Sept 2010): 943-954.). 2. Nonvisualization of the right ovary. 3. Normal sonographic appearance of the uterus and endometrium. Electronically Signed   By: Jeannine Boga M.D.   On: 08/20/2018 23:31  US Pelvis Complete  Result Date: 08/20/2018 CLINICAL DATA:  Follow-up examination for left ovarian cyst. EXAM: TRANSABDOMINAL AND TRANSVAGINAL ULTRASOUND OF PELVIS TECHNIQUE: Both transabdominal and transvaginal ultrasound examinations of  the pelvis were performed. Transabdominal technique was performed for global imaging of the pelvis including uterus, ovaries, adnexal regions, and pelvic cul-de-sac. It was necessary to proceed with endovaginal exam following the transabdominal exam to visualize the uterus, endometrium, and ovaries. COMPARISON:  Prior ultrasound from 11/11/2014 FINDINGS: Uterus Measurements: 3.4 x 1.6 x 4.0 cm. No fibroids or other mass visualized. Endometrium Thickness: 4.5 mm.  No focal abnormality visualized. Right ovary Not visualized.  No adnexal mass. Left ovary Measurements: 3.6 x 3.4 x 3.4 cm. Simple unilocular cyst measuring 3.1 x 3.0 x 3.3 cm is seen (previously 2.4 x 2.3 x 2.6 cm). No internal vascularity, solid nodularity, or significant internal complexity. Other findings No abnormal free fluid. IMPRESSION: 1. 3.3 cm left ovarian cyst, increased in size relative to most recent ultrasound from 11/11/2014, previously 2.6 cm. While this is most likely benign, continued surveillance is warranted in this postmenopausal patient. Follow up ultrasound is recommended in 1 year according to the Society of Radiologists in [place] Statement (D Janae Bridgeman al. Management of Asymptomatic Ovarian and Other Adnexal Cysts Imaged at [place]: Society of Radiologists in [place] Statement 2010. Radiology 256 (Sept 2010): 943-954.). 2. Nonvisualization of the right ovary. 3. Normal sonographic appearance of the uterus and endometrium. Electronically Signed   By: Jeannine Boga M.D.   On: 08/20/2018 23:31       Assessment & Plan:   Problem List Items Addressed This Visit    Cough    Pt with persistent increased cough and congestion.  Hold on abx.  I did give her rx to have if needed (for zpak).  Continue mucinex and nasal flushes.  Prednisone taper as directed.  Rest.  Fluids.  Follow.  Notify me if persistent symptoms.  Discussed possible side effects and risk of prednisone therapy.   She has taken previously and tolerated.            Einar Pheasant, MD

## 2019-02-26 NOTE — Assessment & Plan Note (Signed)
Pt with persistent increased cough and congestion.  Hold on abx.  I did give her rx to have if needed (for zpak).  Continue mucinex and nasal flushes.  Prednisone taper as directed.  Rest.  Fluids.  Follow.  Notify me if persistent symptoms.  Discussed possible side effects and risk of prednisone therapy.  She has taken previously and tolerated.

## 2019-02-26 NOTE — Patient Instructions (Signed)
Take the prednisone as directed.    Can continue the nasal flushes and mucinex as we discussed.    Albuterol inhaler as directed if needed.

## 2019-02-26 NOTE — Telephone Encounter (Signed)
Scheduled pt

## 2019-02-27 ENCOUNTER — Ambulatory Visit: Payer: PPO | Admitting: Internal Medicine

## 2019-03-04 ENCOUNTER — Other Ambulatory Visit: Payer: Self-pay | Admitting: Internal Medicine

## 2019-03-04 ENCOUNTER — Other Ambulatory Visit: Payer: Self-pay

## 2019-03-04 ENCOUNTER — Encounter: Payer: Self-pay | Admitting: Internal Medicine

## 2019-03-05 ENCOUNTER — Encounter: Payer: Self-pay | Admitting: Internal Medicine

## 2019-03-10 ENCOUNTER — Encounter: Payer: Self-pay | Admitting: Internal Medicine

## 2019-03-27 ENCOUNTER — Encounter: Payer: Self-pay | Admitting: Internal Medicine

## 2019-03-27 MED ORDER — CEFDINIR 300 MG PO CAPS: 300.0000 mg | ORAL_CAPSULE | Freq: Two times a day (BID) | ORAL | 0 refills | Status: DC

## 2019-03-27 NOTE — Telephone Encounter (Signed)
She is ok with having appt. I have scheduled her for a virtual visit on Monday 4/6 at 11:30 and she is going to come in and leave a urine that morning prior to appt. She has nkda. Advised patient to only get abx if symptoms become significantly worse

## 2019-03-27 NOTE — Telephone Encounter (Signed)
rx sent in for omnicef 

## 2019-03-27 NOTE — Telephone Encounter (Signed)
Called patient to let her know that we are getting ready to close for the weekend. Confirmed no other acute symptoms. Patient is wondering if we could start an abx and then do a virtual visit next week. She did the home urine dipstick. She does not want to wait through the weekend and then it get worse. Advised pt that I would send message over to provider.

## 2019-03-27 NOTE — Telephone Encounter (Signed)
Ideally would like a urine check to know for sure if truly  Has infection.  I can send in abx to have on hold if symptoms worsen.  Schedule appt with me at the beginning of next week and can check urine and do a virtual visit.  Confirm nkda and I will send in abx.

## 2019-03-30 ENCOUNTER — Other Ambulatory Visit: Payer: Self-pay

## 2019-03-30 ENCOUNTER — Ambulatory Visit (INDEPENDENT_AMBULATORY_CARE_PROVIDER_SITE_OTHER): Payer: PPO | Admitting: Internal Medicine

## 2019-03-30 ENCOUNTER — Telehealth: Payer: Self-pay

## 2019-03-30 DIAGNOSIS — K589 Irritable bowel syndrome without diarrhea: Secondary | ICD-10-CM | POA: Diagnosis not present

## 2019-03-30 DIAGNOSIS — I739 Peripheral vascular disease, unspecified: Secondary | ICD-10-CM | POA: Diagnosis not present

## 2019-03-30 DIAGNOSIS — I6310 Cerebral infarction due to embolism of unspecified precerebral artery: Secondary | ICD-10-CM | POA: Diagnosis not present

## 2019-03-30 DIAGNOSIS — D46A Refractory cytopenia with multilineage dysplasia: Secondary | ICD-10-CM | POA: Diagnosis not present

## 2019-03-30 DIAGNOSIS — D472 Monoclonal gammopathy: Secondary | ICD-10-CM | POA: Diagnosis not present

## 2019-03-30 DIAGNOSIS — I25119 Atherosclerotic heart disease of native coronary artery with unspecified angina pectoris: Secondary | ICD-10-CM | POA: Diagnosis not present

## 2019-03-30 DIAGNOSIS — R059 Cough, unspecified: Secondary | ICD-10-CM

## 2019-03-30 DIAGNOSIS — Z5111 Encounter for antineoplastic chemotherapy: Secondary | ICD-10-CM | POA: Diagnosis not present

## 2019-03-30 DIAGNOSIS — R300 Dysuria: Secondary | ICD-10-CM | POA: Diagnosis not present

## 2019-03-30 DIAGNOSIS — R05 Cough: Secondary | ICD-10-CM

## 2019-03-30 DIAGNOSIS — B029 Zoster without complications: Secondary | ICD-10-CM | POA: Diagnosis not present

## 2019-03-30 DIAGNOSIS — M539 Dorsopathy, unspecified: Secondary | ICD-10-CM | POA: Diagnosis not present

## 2019-03-30 DIAGNOSIS — R935 Abnormal findings on diagnostic imaging of other abdominal regions, including retroperitoneum: Secondary | ICD-10-CM | POA: Diagnosis not present

## 2019-03-30 DIAGNOSIS — R1030 Lower abdominal pain, unspecified: Secondary | ICD-10-CM | POA: Diagnosis not present

## 2019-03-30 LAB — POCT URINALYSIS DIP (MANUAL ENTRY)
Bilirubin, UA: NEGATIVE
Glucose, UA: NEGATIVE mg/dL
Ketones, POC UA: NEGATIVE mg/dL
Nitrite, UA: NEGATIVE
Protein Ur, POC: NEGATIVE mg/dL
Spec Grav, UA: 1.02 (ref 1.010–1.025)
Urobilinogen, UA: 0.2 E.U./dL
pH, UA: 7 (ref 5.0–8.0)

## 2019-03-30 LAB — URINALYSIS, MICROSCOPIC ONLY: RBC / HPF: NONE SEEN (ref 0–?)

## 2019-03-30 MED ORDER — PANTOPRAZOLE SODIUM 20 MG PO TBEC: 20.0000 mg | DELAYED_RELEASE_TABLET | Freq: Two times a day (BID) | ORAL | 2 refills | Status: DC

## 2019-03-30 NOTE — Progress Notes (Signed)
y

## 2019-03-30 NOTE — Progress Notes (Addendum)
[name], female   DOB: 05-06-1951, 68 y.o.   MRN: [id number] Virtual Visit: Note  I connected with Cindy Arnold on 03/30/19 at 11:30 AM EDT by a video enabled telemedicine application and verified that I am speaking with the correct person using two identifiers.  Location patient: home Location provider:work Persons participating in the virtual visit: patient, provider  I discussed the limitations of evaluation and management by telemedicine.  This visit type was conducted due to national recommendations for restrictions regarding the COVID-19 pandemic.  This format is felt to be most appropriate for this patient at this time.  The patient expressed understanding and agreed to proceed.   HPI: Scheduled for an acute visit today.  She reports noticing some increased urinary frequency and urgency.  Some itching with urination.  No vaginal discharge.  No abdominal pain.  No back pain.  No hematuria.  Home test strip - positive leukocytes.  No fever.  Last visit, was having some cough.  Treated with prednisone, etc.  See last note.  Cough is better. Still tickle in the back of her throat.  No chest congestion.  No sob.  No fever.  Feels aggravated by pollen.     ROS: See pertinent positives and negatives per HPI.  Past Medical History:  Diagnosis Date  . Basal cell carcinoma    multiple removed/[facility]  . Breast cancer ([place]) 12/13   s/p right lumpectomy, s/p chemo and XRT  . DES exposure in utero   . Hepatitis A   . History of chicken pox   . Hyperactive airway disease   . 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[place])   . Migraines   . Miscarriage    x3    Past Surgical History:  Procedure Laterality Date  . BASAL CELL CARCINOMA EXCISION     Multiple  . BREAST BIOPSY  1975   Cyst  . BREAST LUMPECTOMY WITH AXILLARY LYMPH NODE DISSECTION  01/06/13, 01/22/13   Dr. Drue Dun, [facility], second surgery for pos margin  . CERVICAL FUSION  2001  . DILATION AND CURETTAGE OF UTERUS      x 3  . VAGINAL DELIVERY     x4    Family History  Problem Relation Age of Onset  . Pancreatic cancer Mother   . Cancer Mother        Pancreatic  . Arthritis Mother   . Hyperlipidemia Mother   . Hypertension Mother   . Heart attack Father   . Hyperlipidemia Father   . Hypertension Father   . Heart disease Father 52  . Colon cancer Other   . Cancer Other 50       Breast and Ovarian- dx 50's/Colon  . Birth defects Maternal Aunt        Breast - in 70's  . Stroke Maternal Grandmother   . Birth defects Maternal Aunt        Breast - in 41's    SOCIAL HX: reviewed.    Current Outpatient Medications:  .  albuterol (PROVENTIL HFA;VENTOLIN HFA) 108 (90 Base) MCG/ACT inhaler, Inhale 2 puffs into the lungs every 6 (six) hours as needed for wheezing or shortness of breath., Disp: 1 Inhaler, Rfl: 0 .  Biotin 5000 MCG TABS, Take by mouth daily., Disp: , Rfl:  .  butalbital-aspirin-caffeine (FIORINAL) 50-325-40 MG capsule, TAKE 1 CAPSULE TWICE DAILY AS NEEDED FORHEADACHE, Disp: 30 capsule, Rfl: 0 .  Calcium-Magnesium-Vitamin D (CALCIUM 500 PO), Take 2 tablets by mouth daily., Disp: ,  Rfl:  .  fish oil-omega-3 fatty acids 1000 MG capsule, Take 1 g by mouth 2 (two) times daily., Disp: , Rfl:  .  Glucos-Chond-Sterol-Fish Oil (GLUCOSAMINE CHONDROITIN PLUS PO), Take by mouth daily., Disp: , Rfl:  .  Glucosamine-Chondroit-Vit C-Mn (GLUCOSAMINE CHONDR 1500 COMPLX) CAPS, Take by mouth., Disp: , Rfl:  .  hydrOXYzine (ATARAX/VISTARIL) 10 MG tablet, TAKE ONE TABLET daily as needed for itching, Disp: 30 tablet, Rfl: 0 .  ipratropium (ATROVENT) 0.06 % nasal spray, PLACE 2 SPRAYS INTO THE NOSE 3 TIMES DAILY, Disp: 30 mL, Rfl: 2 .  LORazepam (ATIVAN) 0.5 MG tablet, Take 1 tablet (0.5 mg total) by mouth daily as needed for anxiety., Disp: 30 tablet, Rfl: 0 .  meloxicam (MOBIC) 15 MG tablet, Take 1 tablet by mouth daily., Disp: , Rfl:  .  Multiple Vitamins-Minerals (MULTIVITAMIN WITH MINERALS) tablet,  Take 1 tablet by mouth daily., Disp: , Rfl:  .  pantoprazole (PROTONIX) 20 MG tablet, Take 1 tablet (20 mg total) by mouth 2 (two) times daily before a meal., Disp: 60 tablet, Rfl: 2 .  propranolol (INDERAL) 40 MG tablet, TAKE 1 TABLET DAILY, Disp: 90 tablet, Rfl: 1 .  triamcinolone cream (KENALOG) 0.1 %, , Disp: , Rfl:  .  Turmeric Curcumin 500 MG CAPS, Take by mouth., Disp: , Rfl:  .  zolpidem (AMBIEN) 5 MG tablet, Take 1 tablet (5 mg total) by mouth at bedtime as needed., Disp: 30 tablet, Rfl: 1 .  amoxicillin (AMOXIL) 500 MG capsule, Take 1 capsule (500 mg total) by mouth 3 (three) times daily., Disp: 21 capsule, Rfl: 0  EXAM:  GENERAL: alert, oriented, appears well and in no acute distress  HEENT: atraumatic, conjunttiva clear, no obvious abnormalities on inspection of external nose and ears  NECK: normal movements of the head and neck  LUNGS: on inspection no signs of respiratory distress, breathing rate appears normal, no obvious gross SOB, gasping or wheezing  CV: no obvious cyanosis  PSYCH/NEURO: pleasant and cooperative, no obvious depression or anxiety, speech and thought processing grossly intact  ASSESSMENT AND PLAN:  Discussed the following assessment and plan:  Dysuria - Urinary frequency and urgency as outlined.  Urine dip reviewed.  Given persistent symptoms, will treat with omnicef.  Send culture.  No vaginal discharge.  Foll - Plan: POCT urinalysis dipstick, Urine Culture, Urine Microscopic  Cough     I discussed the assessment and treatment plan with the patient. The patient was provided an opportunity to ask questions and all were answered. The patient agreed with the plan and demonstrated an understanding of the instructions.   The patient was advised to call back or seek an in-person evaluation if the symptoms worsen or if the condition fails to improve as anticipated.    Cindy Pheasant, MD

## 2019-03-30 NOTE — Telephone Encounter (Signed)
Pt coming in for urine sample this morning prior to virtual visit. Orders have been placed

## 2019-04-01 LAB — URINE CULTURE
MICRO NUMBER:: 377305
SPECIMEN QUALITY:: ADEQUATE

## 2019-04-02 ENCOUNTER — Other Ambulatory Visit: Payer: Self-pay

## 2019-04-02 MED ORDER — AMOXICILLIN 500 MG PO CAPS: 500.0000 mg | ORAL_CAPSULE | Freq: Three times a day (TID) | ORAL | 0 refills | Status: DC

## 2019-04-04 ENCOUNTER — Encounter: Payer: Self-pay | Admitting: Internal Medicine

## 2019-04-04 NOTE — Assessment & Plan Note (Signed)
Some cough as outlined.  Better.  Steroid nasal spray and robitussin DM as directed.  Consider pepcid 20mg  q day.  Follow.  Notify me if persistent cough.

## 2019-04-10 ENCOUNTER — Encounter: Payer: Self-pay | Admitting: Internal Medicine

## 2019-04-27 ENCOUNTER — Other Ambulatory Visit: Payer: Self-pay

## 2019-04-27 ENCOUNTER — Ambulatory Visit (INDEPENDENT_AMBULATORY_CARE_PROVIDER_SITE_OTHER): Payer: PPO

## 2019-04-27 DIAGNOSIS — Z0000 Encounter for general adult medical examination without abnormal findings: Secondary | ICD-10-CM | POA: Diagnosis not present

## 2019-04-27 NOTE — Progress Notes (Signed)
Subjective:   Cindy Arnold is a 68 y.o. female who presents for Medicare Annual (Subsequent) preventive examination.  Review of Systems:  No ROS.  Medicare Wellness Virtual Visit. UTA vital signs.  Additional risk factors are reflected in the social history.   Cardiac Risk Factors include: advanced age (>77men, >30 women)     Objective:     Vitals: There were no vitals taken for this visit.  There is no height or weight on file to calculate BMI.  Advanced Directives 04/27/2019  Does Patient Have a Medical Advance Directive? Yes  Type of Paramedic of [place];Living will  Does patient want to make changes to medical advance directive? No - Guardian declined  Copy of [place] in Chart? No - copy requested    Tobacco Social History   Tobacco Use  Smoking Status Never Smoker  Smokeless Tobacco Never Used     Counseling given: Not Answered   Clinical Intake:  Pre-visit preparation completed: No        Diabetes: No  How often do you need to have someone help you when you read instructions, pamphlets, or other written materials from your doctor or pharmacy?: 1 - Never  Interpreter Needed?: No     Past Medical History:  Diagnosis Date  . Basal cell carcinoma    multiple removed/[facility]  . Breast cancer ([place]) 12/13   s/p right lumpectomy, s/p chemo and XRT  . DES exposure in utero   . Hepatitis A   . History of chicken pox   . Hyperactive airway disease   . 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[place])   . Migraines   . Miscarriage    x3   Past Surgical History:  Procedure Laterality Date  . BASAL CELL CARCINOMA EXCISION     Multiple  . BREAST BIOPSY  1975   Cyst  . BREAST LUMPECTOMY WITH AXILLARY LYMPH NODE DISSECTION  01/06/13, 01/22/13   Dr. Drue Dun, [facility], second surgery for pos margin  . CERVICAL FUSION  2001  . DILATION AND CURETTAGE OF UTERUS     x 3  . VAGINAL DELIVERY     x4   Family History   Problem Relation Age of Onset  . Pancreatic cancer Mother   . Cancer Mother        Pancreatic  . Arthritis Mother   . Hyperlipidemia Mother   . Hypertension Mother   . Heart attack Father   . Hyperlipidemia Father   . Hypertension Father   . Heart disease Father 55  . Colon cancer Other   . Cancer Other 50       Breast and Ovarian- dx 50's/Colon  . Birth defects Maternal Aunt        Breast - in 39's  . Stroke Maternal Grandmother   . Birth defects Maternal Aunt        Breast - in 110's   Social History   Socioeconomic History  . Marital status: Married    Spouse name: Not on file  . Number of children: 4  . Years of education: Not on file  . Highest education level: Not on file  Occupational History  . Occupation: Corporate treasurer - [employer]  . Financial resource strain: Not hard at all  . Food insecurity:    Worry: Never true    Inability: Never true  . Transportation needs:    Medical: No    Non-medical: No  Tobacco  Use  . Smoking status: Never Smoker  . Smokeless tobacco: Never Used  Substance and Sexual Activity  . Alcohol use: Yes    Alcohol/week: 0.0 standard drinks    Comment: 3 times a week  . Drug use: No  . Sexual activity: Not on file  Lifestyle  . Physical activity:    Days per week: 5 days    Minutes per session: 40 min  . Stress: Not at all  Relationships  . Social connections:    Talks on phone: Not on file    Gets together: Not on file    Attends religious service: Not on file    Active member of club or organization: Not on file    Attends meetings of clubs or organizations: Not on file    Relationship status: Not on file  Other Topics Concern  . Not on file  Social History Narrative   Lives in [place] with husband.  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     Outpatient Encounter Medications as of 04/27/2019  Medication Sig  . albuterol (PROVENTIL HFA;VENTOLIN HFA) 108 (90 Base) MCG/ACT inhaler Inhale 2 puffs into the lungs  every 6 (six) hours as needed for wheezing or shortness of breath.  Marland Kitchen BIOTIN PO Take 10,000 mg by mouth daily.   . Cholecalciferol (VITAMIN D3) 125 MCG (5000 UT) CAPS   . Glucosamine-Chondroitin (MOVE FREE PO) Take 1 capsule by mouth daily. Cartilage/boron/hyalauronic acid  . hydrOXYzine (ATARAX/VISTARIL) 10 MG tablet TAKE ONE TABLET daily as needed for itching  . ipratropium (ATROVENT) 0.06 % nasal spray PLACE 2 SPRAYS INTO THE NOSE 3 TIMES DAILY  . LORazepam (ATIVAN) 0.5 MG tablet Take 1 tablet (0.5 mg total) by mouth daily as needed for anxiety.  Marland Kitchen MAGNESIUM BISGLYCINATE PO Take 200 mg by mouth daily.  . Multiple Minerals-Vitamins (CALCIUM CITRATE PLUS/MAGNESIUM PO) Take 2 tablets by mouth 2 (two) times daily. CAL CITRATE 500-MAGNESIUM 80-ZINC-10  . Multiple Vitamins-Minerals (MULTIVITAMIN WOMENS 50+ ADV PO) Take 1 tablet by mouth daily.  . Omega-3 Fatty Acids (OMEGA-3 FISH OIL PO) Take 1,280 mg by mouth 2 (two) times daily.  . pantoprazole (PROTONIX) 20 MG tablet Take 1 tablet (20 mg total) by mouth 2 (two) times daily before a meal.  . propranolol (INDERAL) 40 MG tablet TAKE 1 TABLET DAILY  . Riboflavin (VITAMIN B2 PO) Take 250 mg by mouth daily.  Marland Kitchen triamcinolone cream (KENALOG) 0.1 %   . TURMERIC CURCUMIN PO Take 2,250 mg by mouth 3 (three) times daily.  Marland Kitchen zolpidem (AMBIEN) 5 MG tablet Take 1 tablet (5 mg total) by mouth at bedtime as needed.  . [DISCONTINUED] Turmeric Curcumin 500 MG CAPS Take by mouth.  . butalbital-aspirin-caffeine (FIORINAL) 50-325-40 MG capsule TAKE 1 CAPSULE TWICE DAILY AS NEEDED FORHEADACHE (Patient not taking: Reported on 04/27/2019)  . [DISCONTINUED] amoxicillin (AMOXIL) 500 MG capsule Take 1 capsule (500 mg total) by mouth 3 (three) times daily.  . [DISCONTINUED] Biotin 5000 MCG TABS Take by mouth daily.  . [DISCONTINUED] Calcium-Magnesium-Vitamin D (CALCIUM 500 PO) Take 2 tablets by mouth daily.  . [DISCONTINUED] fish oil-omega-3 fatty acids 1000 MG capsule Take  1,280 mg by mouth 2 (two) times daily.   . [DISCONTINUED] Glucos-Chond-Sterol-Fish Oil (GLUCOSAMINE CHONDROITIN PLUS PO) Take by mouth daily.  . [DISCONTINUED] Glucosamine-Chondroit-Vit C-Mn (GLUCOSAMINE CHONDR 1500 COMPLX) CAPS Take by mouth.  . [DISCONTINUED] meloxicam (MOBIC) 15 MG tablet Take 1 tablet by mouth daily.  . [DISCONTINUED] Multiple Vitamins-Minerals (MULTIVITAMIN WITH MINERALS) tablet Take 1 tablet by  mouth daily.   No facility-administered encounter medications on file as of 04/27/2019.     Activities of Daily Living In your present state of health, do you have any difficulty performing the following activities: 04/27/2019  Hearing? N  Vision? N  Difficulty concentrating or making decisions? N  Walking or climbing stairs? N  Dressing or bathing? N  Doing errands, shopping? N  Preparing Food and eating ? N  Using the Toilet? N  In the past six months, have you accidently leaked urine? N  Do you have problems with loss of bowel control? N  Managing your Medications? N  Managing your Finances? N  Housekeeping or managing your Housekeeping? N  Some recent data might be hidden    Patient Care Team: Einar Pheasant, MD as PCP - General (Internal Medicine)    Assessment:   This is a routine wellness examination for Cindy Arnold.  I connected with patient 04/27/19 at 11:30 AM EDT by a video enabled telemedicine application and verified that I am speaking with the correct person using two identifiers. Patient stated full name and DOB. Patient gave permission to continue with virtual visit. Patient's location was at home and Nurse's location was at [place] office.   Fiorinal-She has no refills for fiorinal but would like to keep one on file. She has not been having debilitating headaches. Managing ok. Physical scheduled with you June 5 and she is ok to discuss at this time. Added to appointment note.   Health Screenings  Mammogram - 01/14/19 Colonoscopy - 02/06/12 Bone Density -  01/18/13 Glaucoma -none Hearing -demonstrates normal hearing during visit. Cholesterol - 02/12/19 (175) Dental- UTD [facility]- annual visits  Social  Alcohol intake - yes Smoking history- never Smokers in home? none Illicit drug use? none Exercise - walking Diet - regular BMI- discussed the importance of a healthy diet, water intake and the benefits of aerobic exercise.  Educational material provided.   Safety  Patient feels safe at home- yes Patient does have smoke detectors at home- yes Patient does wear sunscreen or protective clothing when in direct sunlight -yes Patient does wear seat belt when in a moving vehicle -yes  Activities of Daily Living Patient can do their own household chores. Denies needing assistance with: driving, feeding themselves, getting from bed to chair, getting to the toilet, bathing/showering, dressing, managing money, or preparing meals.  No new identified risk were noted.    Depression Screen Patient denies losing interest in daily life, feeling hopeless, or crying easily over simple problems.   Medication-taking as directed and without issues.   Fall Screen Patient denies being afraid of falling or falling in the last year.   Memory Screen Patient denies problems with memory, misplacing items, and is able to balance checkbook/bank accounts.  Patient is alert, normal appearance, oriented to person/place/and time. Correctly identified the president of the [place] and recall of 2/3 objects. Patient will read, plays computer games, and/or work puzzles for brain stimulation.  Immunizations The following Immunizations were discussed: Influenza, shingles, pneumonia, and tetanus.   Other Providers Patient Care Team: Einar Pheasant, MD as PCP - General (Internal Medicine)  Exercise Activities and Dietary recommendations Current Exercise Habits: Home exercise routine, Type of exercise: walking, Frequency (Times/Week): 5, Intensity: Moderate  Goals    .  Maintain Healthy Lifestyle     Stay active Stay hydrated Healthy diet       Fall Risk Fall Risk  04/27/2019 12/06/2017 11/29/2016 05/25/2016  Falls in the past year? 0  No No No   Depression Screen PHQ 2/9 Scores 04/27/2019 12/06/2017 05/15/2017 11/29/2016  PHQ - 2 Score 0 0 0 0  PHQ- 9 Score - - 0 -     Cognitive Function     6CIT Screen 04/27/2019  What Year? 0 points  What month? 0 points  What time? 0 points  Count back from 20 0 points  Months in reverse 0 points  Repeat phrase 0 points  Total Score 0    Immunization History  Administered Date(s) Administered  . Influenza Split 11/14/2011, 10/02/2014  . Influenza, High Dose Seasonal PF 09/26/2018  . Influenza-Unspecified 10/22/2013, 08/26/2015, 10/03/2016  . Pneumococcal Conjugate-13 08/21/2017  . Pneumococcal Polysaccharide-23 11/05/2013  . Tdap 05/14/2011, 07/08/2017  . Zoster 10/25/2011  . Zoster Recombinat (Shingrix) 11/15/2017, 03/04/2018   Screening Tests Health Maintenance  Topic Date Due  . Hepatitis C Screening  Feb 03, 1951  . PNA vac Low Risk Adult (2 of 2 - PPSV23) 02/29/2020 (Originally 11/05/2018)  . INFLUENZA VACCINE  07/25/2019  . MAMMOGRAM  01/14/2021  . COLONOSCOPY  02/05/2022  . TETANUS/TDAP  07/09/2027  . DEXA SCAN  Completed       Plan:   End of life planning; Advance aging; Advanced directives discussed. Copy of current HCPOA/Living Will requested.    I have personally reviewed and noted the following in the patient's chart:   . Medical and social history . Use of alcohol, tobacco or illicit drugs  . Current medications and supplements . Functional ability and status . Nutritional status . Physical activity . Advanced directives . List of other physicians . Hospitalizations, surgeries, and ER visits in previous 12 months . Vitals . Screenings to include cognitive, depression, and falls . Referrals and appointments  In addition, I have reviewed and discussed with patient certain  preventive protocols, quality metrics, and best practice recommendations. A written personalized care plan for preventive services as well as general preventive health recommendations were provided to patient.     Varney Biles, LPN  02/25/7424   Reviewed above information.  Agree with assessment and plan.   Dr Nicki Reaper

## 2019-04-27 NOTE — Patient Instructions (Addendum)
  Cindy Arnold , Thank you for taking time to come for your Medicare Wellness Visit. I appreciate your ongoing commitment to your health goals. Please review the following plan we discussed and let me know if I can assist you in the future.   These are the goals we discussed: Goals    . Maintain Healthy Lifestyle     Stay active Stay hydrated Healthy diet       This is a list of the screening recommended for you and due dates:  Health Maintenance  Topic Date Due  .  Hepatitis C: One time screening is recommended by Center for Disease Control  (CDC) for  adults born from [birth year] through 1964.   18-Aug-1951  . Pneumonia vaccines (2 of 2 - PPSV23) 02/29/2020*  . Flu Shot  07/25/2019  . Mammogram  01/14/2021  . Colon Cancer Screening  02/05/2022  . Tetanus Vaccine  07/09/2027  . DEXA scan (bone density measurement)  Completed  *Topic was postponed. The date shown is not the original due date.

## 2019-05-01 ENCOUNTER — Encounter: Payer: Self-pay | Admitting: Internal Medicine

## 2019-05-01 NOTE — Telephone Encounter (Signed)
Confirmed with pt that she is not having any other acute symptoms. No fever, chills, body aches, etc. Symptoms come and go. She is overall feeling okay. Not taking anything for her allergies. Just wondering if she should be trying anything over the counter or just wait and see if symptoms go away with time.

## 2019-05-01 NOTE — Telephone Encounter (Signed)
Can try gargling with salt water. Saline nasal spray - flush nose a couple of times per day.  Tylenol.  If persistent symptoms can schedule appt.

## 2019-05-27 ENCOUNTER — Other Ambulatory Visit: Payer: Self-pay

## 2019-05-28 ENCOUNTER — Telehealth: Payer: Self-pay | Admitting: Internal Medicine

## 2019-05-28 NOTE — Telephone Encounter (Signed)
-----   Message from Maceo Pro, MD sent at 05/27/2019 10:50 PM EDT ----- Regarding: ovarian cyst, referral [name] Patient,  Cindy Arnold was scheduled to see me regarding her left ovarian cyst in March when COVID nonsense started.  She elected to reschedule until more was known.  She was rescheduled for tomorrow, however I am going to postpone it until September, which is a year from her previous ultrasound.  Her cyst is fairly small, simple, and essentially stable from 2015-2019.  She is not having any pain or other symptoms on her left.  I'll forward you the consult / progress notes when she eventually has her visit but I wanted to tell you so you weren't concerned that she still hasn't seen me.  I've reviewed her images twice now, and still agree with myself: that it is ok to wait the year (and probably more, truthfully).  Hope you are well! Thanks for the referral!  [facility]

## 2019-05-29 ENCOUNTER — Encounter: Payer: Self-pay | Admitting: Internal Medicine

## 2019-05-29 ENCOUNTER — Ambulatory Visit (INDEPENDENT_AMBULATORY_CARE_PROVIDER_SITE_OTHER): Payer: PPO | Admitting: Internal Medicine

## 2019-05-29 ENCOUNTER — Other Ambulatory Visit: Payer: Self-pay

## 2019-05-29 DIAGNOSIS — G43809 Other migraine, not intractable, without status migrainosus: Secondary | ICD-10-CM

## 2019-05-29 DIAGNOSIS — R05 Cough: Secondary | ICD-10-CM | POA: Diagnosis not present

## 2019-05-29 DIAGNOSIS — J4520 Mild intermittent asthma, uncomplicated: Secondary | ICD-10-CM

## 2019-05-29 DIAGNOSIS — N83202 Unspecified ovarian cyst, left side: Secondary | ICD-10-CM

## 2019-05-29 DIAGNOSIS — Z853 Personal history of malignant neoplasm of breast: Secondary | ICD-10-CM | POA: Diagnosis not present

## 2019-05-29 DIAGNOSIS — K219 Gastro-esophageal reflux disease without esophagitis: Secondary | ICD-10-CM

## 2019-05-29 DIAGNOSIS — R059 Cough, unspecified: Secondary | ICD-10-CM

## 2019-05-29 MED ORDER — IPRATROPIUM BROMIDE 0.06 % NA SOLN: NASAL | 2 refills | Status: DC

## 2019-05-29 MED ORDER — BUTALBITAL-ASPIRIN-CAFFEINE 50-325-40 MG PO CAPS: ORAL_CAPSULE | ORAL | 0 refills | Status: DC

## 2019-05-29 NOTE — Progress Notes (Signed)
[name], female   DOB: 04/30/1951, 68 y.o.   MRN: [id number]   Virtual Visit via video Note  This visit type was conducted due to national recommendations for restrictions regarding the COVID-19 pandemic (e.g. social distancing).  This format is felt to be most appropriate for this patient at this time.  All issues noted in this document were discussed and addressed.  No physical exam was performed (except for noted visual exam findings with Video Visits).   I connected with Cindy Arnold by a video enabled telemedicine application or telephone and verified that I am speaking with the correct person using two identifiers. Location patient: home Location provider: work  Persons participating in the virtual visit: patient, provider  I discussed the limitations, risks, security and privacy concerns of performing an evaluation and management service by video and the availability of in person appointments.  The patient expressed understanding and agreed to proceed.   Reason for visit: scheduled follow up.   HPI: She reports she is doing well.  Feels good.  Daughter is visiting.  Is pregnant.  Excited about this.  Stays active.  No chest pain.  No sob.  No acid reflux.  No abdominal pain.  Bowels moving.  No cough or sob.  No chest congestion.  Taking zyrtec daily now.  Using atrovent.  Symptoms controlled.  Has ovarian cyst.  Planning f/u with gyn 08/2018.  Has migraines.  On propranolol.  Request to have fiorinal to take prn.  Rarely takes.  Has seen ortho for right hip and knee pain.  Overall doing well.     ROS: See pertinent positives and negatives per HPI.  Past Medical History:  Diagnosis Date  . Basal cell carcinoma    multiple removed/[facility]  . Breast cancer ([place]) 12/13   s/p right lumpectomy, s/p chemo and XRT  . DES exposure in utero   . Hepatitis A   . History of chicken pox   . Hyperactive airway disease   . 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[place])   .  Migraines   . Miscarriage    x3    Past Surgical History:  Procedure Laterality Date  . BASAL CELL CARCINOMA EXCISION     Multiple  . BREAST BIOPSY  1975   Cyst  . BREAST LUMPECTOMY WITH AXILLARY LYMPH NODE DISSECTION  01/06/13, 01/22/13   Dr. Drue Dun, [facility], second surgery for pos margin  . CERVICAL FUSION  2001  . DILATION AND CURETTAGE OF UTERUS     x 3  . VAGINAL DELIVERY     x4    Family History  Problem Relation Age of Onset  . Pancreatic cancer Mother   . Cancer Mother        Pancreatic  . Arthritis Mother   . Hyperlipidemia Mother   . Hypertension Mother   . Heart attack Father   . Hyperlipidemia Father   . Hypertension Father   . Heart disease Father 26  . Colon cancer Other   . Cancer Other 50       Breast and Ovarian- dx 50's/Colon  . Birth defects Maternal Aunt        Breast - in 7's  . Stroke Maternal Grandmother   . Birth defects Maternal Aunt        Breast - in 59's    SOCIAL HX: reviewed.    Current Outpatient Medications:  .  albuterol (PROVENTIL HFA;VENTOLIN HFA) 108 (90 Base) MCG/ACT inhaler, Inhale 2 puffs into  the lungs every 6 (six) hours as needed for wheezing or shortness of breath., Disp: 1 Inhaler, Rfl: 0 .  BIOTIN PO, Take 10,000 mg by mouth daily. , Disp: , Rfl:  .  Cholecalciferol (VITAMIN D3) 125 MCG (5000 UT) CAPS, , Disp: , Rfl:  .  Glucosamine-Chondroitin (MOVE FREE PO), Take 1 capsule by mouth daily. Cartilage/boron/hyalauronic acid, Disp: , Rfl:  .  hydrOXYzine (ATARAX/VISTARIL) 10 MG tablet, TAKE ONE TABLET daily as needed for itching, Disp: 30 tablet, Rfl: 0 .  ipratropium (ATROVENT) 0.06 % nasal spray, PLACE 2 SPRAYS INTO THE NOSE 2 TIMES DAILY, Disp: 30 mL, Rfl: 2 .  LORazepam (ATIVAN) 0.5 MG tablet, Take 1 tablet (0.5 mg total) by mouth daily as needed for anxiety., Disp: 30 tablet, Rfl: 0 .  MAGNESIUM BISGLYCINATE PO, Take 200 mg by mouth daily., Disp: , Rfl:  .  Multiple Minerals-Vitamins (CALCIUM CITRATE PLUS/MAGNESIUM  PO), Take 2 tablets by mouth 2 (two) times daily. CAL CITRATE 500-MAGNESIUM 80-ZINC-10, Disp: , Rfl:  .  Multiple Vitamins-Minerals (MULTIVITAMIN WOMENS 50+ ADV PO), Take 1 tablet by mouth daily., Disp: , Rfl:  .  Omega-3 Fatty Acids (OMEGA-3 FISH OIL PO), Take 1,280 mg by mouth 2 (two) times daily., Disp: , Rfl:  .  pantoprazole (PROTONIX) 20 MG tablet, Take 1 tablet (20 mg total) by mouth 2 (two) times daily before a meal. (Patient taking differently: Take 20 mg by mouth daily. ), Disp: 60 tablet, Rfl: 2 .  propranolol (INDERAL) 40 MG tablet, TAKE 1 TABLET DAILY, Disp: 90 tablet, Rfl: 1 .  Riboflavin (VITAMIN B2 PO), Take 250 mg by mouth daily., Disp: , Rfl:  .  triamcinolone cream (KENALOG) 0.1 %, , Disp: , Rfl:  .  TURMERIC CURCUMIN PO, Take 2,250 mg by mouth 3 (three) times daily., Disp: , Rfl:  .  zolpidem (AMBIEN) 5 MG tablet, Take 1 tablet (5 mg total) by mouth at bedtime as needed., Disp: 30 tablet, Rfl: 1 .  butalbital-aspirin-caffeine (FIORINAL) 50-325-40 MG capsule, TAKE 1 CAPSULE TWICE DAILY AS NEEDED FORHEADACHE, Disp: 30 capsule, Rfl: 0  EXAM:  GENERAL: alert, oriented, appears well and in no acute distress  HEENT: atraumatic, conjunttiva clear, no obvious abnormalities on inspection of external nose and ears  NECK: normal movements of the head and neck  LUNGS: on inspection no signs of respiratory distress, breathing rate appears normal, no obvious gross SOB, gasping or wheezing  CV: no obvious cyanosis  PSYCH/NEURO: pleasant and cooperative, no obvious depression or anxiety, speech and thought processing grossly intact  ASSESSMENT AND PLAN:  Discussed the following assessment and plan:  Mild intermittent asthma, unspecified whether complicated  Cough  Gastroesophageal reflux disease, esophagitis presence not specified  History of breast cancer  Other migraine without status migrainosus, not intractable  Cyst of left ovary  Asthma Breathing stable.   Cough  Cough resolved.  Continue treatment for allergies.  Follow.   GERD (gastroesophageal reflux disease) No upper symptoms reported.  Follow.    History of breast cancer Has been seeing oncology.  Stopped aromasin 08/2018.  Had evaluation 12/2018.  Recommended f/u in one year.   Migraines On propranolol.  Headaches stable.  Request fiorinal refill.    Ovarian cyst Ovarian cyst noted on ultrasound.  Planning to f/u with gyn 08/2018.      I discussed the assessment and treatment plan with the patient. The patient was provided an opportunity to ask questions and all were answered. The patient agreed with the plan  and demonstrated an understanding of the instructions.   The patient was advised to call back or seek an in-person evaluation if the symptoms worsen or if the condition fails to improve as anticipated.    Einar Pheasant, MD

## 2019-05-31 ENCOUNTER — Encounter: Payer: Self-pay | Admitting: Internal Medicine

## 2019-05-31 NOTE — Assessment & Plan Note (Signed)
Has been seeing oncology.  Stopped aromasin 08/2018.  Had evaluation 12/2018.  Recommended f/u in one year.

## 2019-05-31 NOTE — Assessment & Plan Note (Signed)
Breathing stable.

## 2019-05-31 NOTE — Assessment & Plan Note (Signed)
Ovarian cyst noted on ultrasound.  Planning to f/u with gyn 08/2018.

## 2019-05-31 NOTE — Assessment & Plan Note (Signed)
On propranolol.  Headaches stable.  Request fiorinal refill.

## 2019-05-31 NOTE — Assessment & Plan Note (Signed)
No upper symptoms reported.  Follow.   

## 2019-05-31 NOTE — Assessment & Plan Note (Signed)
Cough resolved.  Continue treatment for allergies.  Follow.

## 2019-06-22 ENCOUNTER — Encounter: Payer: Self-pay | Admitting: Internal Medicine

## 2019-06-30 ENCOUNTER — Other Ambulatory Visit: Payer: Self-pay | Admitting: Internal Medicine

## 2019-07-03 ENCOUNTER — Other Ambulatory Visit: Payer: Self-pay | Admitting: Internal Medicine

## 2019-07-03 ENCOUNTER — Telehealth: Payer: Self-pay

## 2019-07-07 ENCOUNTER — Encounter: Payer: Self-pay | Admitting: Internal Medicine

## 2019-07-07 DIAGNOSIS — Z20828 Contact with and (suspected) exposure to other viral communicable diseases: Secondary | ICD-10-CM

## 2019-07-07 DIAGNOSIS — Z20822 Contact with and (suspected) exposure to covid-19: Secondary | ICD-10-CM

## 2019-07-08 NOTE — Telephone Encounter (Signed)
covid test ordered

## 2019-07-09 DIAGNOSIS — Z20828 Contact with and (suspected) exposure to other viral communicable diseases: Secondary | ICD-10-CM | POA: Diagnosis not present

## 2019-07-13 LAB — NOVEL CORONAVIRUS, NAA: SARS-CoV-2, NAA: NOT DETECTED

## 2019-07-21 ENCOUNTER — Other Ambulatory Visit: Payer: Self-pay | Admitting: Internal Medicine

## 2019-07-22 NOTE — Telephone Encounter (Signed)
Last OV 05/29/19 Next OV none scheduled Last refill 02/04/19

## 2019-07-22 NOTE — Telephone Encounter (Signed)
rx sent in for lorazepam #30 with no refills.   

## 2019-09-08 ENCOUNTER — Encounter: Payer: Self-pay | Admitting: Internal Medicine

## 2019-09-09 ENCOUNTER — Other Ambulatory Visit: Payer: Self-pay | Admitting: *Deleted

## 2019-09-09 ENCOUNTER — Encounter: Payer: Self-pay | Admitting: Internal Medicine

## 2019-09-09 DIAGNOSIS — Z20822 Contact with and (suspected) exposure to covid-19: Secondary | ICD-10-CM

## 2019-09-09 DIAGNOSIS — R6889 Other general symptoms and signs: Secondary | ICD-10-CM | POA: Diagnosis not present

## 2019-09-09 NOTE — Telephone Encounter (Signed)
If she is not having any symptoms and her grandson is not having any symptoms, she does not necessarily have to be tested.  I don't know that I would test her at this point.  Can set up doxy appt to discuss options, etc.

## 2019-09-10 LAB — NOVEL CORONAVIRUS, NAA: SARS-CoV-2, NAA: NOT DETECTED

## 2019-09-15 ENCOUNTER — Other Ambulatory Visit: Payer: Self-pay | Admitting: Internal Medicine

## 2019-09-15 MED ORDER — PROPRANOLOL HCL 40 MG PO TABS: 40.0000 mg | ORAL_TABLET | Freq: Every day | ORAL | 1 refills | Status: DC

## 2019-09-15 NOTE — Telephone Encounter (Signed)
rx sent in for propranolol    Dr Nicki Reaper

## 2019-10-13 NOTE — Telephone Encounter (Signed)
error 

## 2019-10-16 ENCOUNTER — Other Ambulatory Visit: Payer: Self-pay | Admitting: Internal Medicine

## 2019-10-17 NOTE — Telephone Encounter (Signed)
rx refilled for ambein #30 with no refills and lorazepam #30 with no refills.

## 2019-11-09 DIAGNOSIS — D2230 Melanocytic nevi of unspecified part of face: Secondary | ICD-10-CM | POA: Diagnosis not present

## 2019-11-09 DIAGNOSIS — D225 Melanocytic nevi of trunk: Secondary | ICD-10-CM | POA: Diagnosis not present

## 2019-11-09 DIAGNOSIS — L570 Actinic keratosis: Secondary | ICD-10-CM | POA: Diagnosis not present

## 2019-11-09 DIAGNOSIS — L578 Other skin changes due to chronic exposure to nonionizing radiation: Secondary | ICD-10-CM | POA: Diagnosis not present

## 2019-11-09 DIAGNOSIS — L820 Inflamed seborrheic keratosis: Secondary | ICD-10-CM | POA: Diagnosis not present

## 2019-11-09 DIAGNOSIS — Z85828 Personal history of other malignant neoplasm of skin: Secondary | ICD-10-CM | POA: Diagnosis not present

## 2019-11-09 DIAGNOSIS — Z1283 Encounter for screening for malignant neoplasm of skin: Secondary | ICD-10-CM | POA: Diagnosis not present

## 2019-11-09 DIAGNOSIS — L821 Other seborrheic keratosis: Secondary | ICD-10-CM | POA: Diagnosis not present

## 2019-11-09 DIAGNOSIS — D229 Melanocytic nevi, unspecified: Secondary | ICD-10-CM | POA: Diagnosis not present

## 2019-11-09 DIAGNOSIS — D1800 Hemangioma unspecified site: Secondary | ICD-10-CM | POA: Diagnosis not present

## 2019-11-09 DIAGNOSIS — L814 Other melanin hyperpigmentation: Secondary | ICD-10-CM | POA: Diagnosis not present

## 2019-12-10 ENCOUNTER — Other Ambulatory Visit: Payer: Self-pay

## 2019-12-10 ENCOUNTER — Ambulatory Visit: Payer: PPO | Attending: Internal Medicine

## 2019-12-10 DIAGNOSIS — Z20822 Contact with and (suspected) exposure to covid-19: Secondary | ICD-10-CM

## 2019-12-10 DIAGNOSIS — Z20828 Contact with and (suspected) exposure to other viral communicable diseases: Secondary | ICD-10-CM | POA: Diagnosis not present

## 2019-12-11 ENCOUNTER — Other Ambulatory Visit: Payer: Self-pay | Admitting: Internal Medicine

## 2019-12-11 LAB — NOVEL CORONAVIRUS, NAA: SARS-CoV-2, NAA: NOT DETECTED

## 2019-12-30 ENCOUNTER — Encounter: Payer: Self-pay | Admitting: Internal Medicine

## 2019-12-31 ENCOUNTER — Other Ambulatory Visit: Payer: Self-pay

## 2019-12-31 ENCOUNTER — Telehealth: Payer: Self-pay

## 2019-12-31 ENCOUNTER — Other Ambulatory Visit (INDEPENDENT_AMBULATORY_CARE_PROVIDER_SITE_OTHER): Payer: PPO

## 2019-12-31 DIAGNOSIS — R300 Dysuria: Secondary | ICD-10-CM | POA: Diagnosis not present

## 2019-12-31 LAB — URINALYSIS, ROUTINE W REFLEX MICROSCOPIC
Bilirubin Urine: NEGATIVE
Hgb urine dipstick: NEGATIVE
Ketones, ur: NEGATIVE
Leukocytes,Ua: NEGATIVE
Nitrite: NEGATIVE
RBC / HPF: NONE SEEN (ref 0–?)
Specific Gravity, Urine: <=1.005 — AB (ref 1.000–1.030)
Total Protein, Urine: NEGATIVE
Urine Glucose: NEGATIVE
Urobilinogen, UA: 0.2 (ref 0.0–1.0)
WBC, UA: NONE SEEN (ref 0–?)
pH: 6 (ref 5.0–8.0)

## 2019-12-31 NOTE — Telephone Encounter (Signed)
Pt scheduled for urine and appt with PCP

## 2019-12-31 NOTE — Telephone Encounter (Signed)
Orders placed for urine.

## 2020-01-01 ENCOUNTER — Encounter: Payer: Self-pay | Admitting: Internal Medicine

## 2020-01-01 ENCOUNTER — Ambulatory Visit (INDEPENDENT_AMBULATORY_CARE_PROVIDER_SITE_OTHER): Payer: PPO | Admitting: Internal Medicine

## 2020-01-01 DIAGNOSIS — R3915 Urgency of urination: Secondary | ICD-10-CM

## 2020-01-01 DIAGNOSIS — Z853 Personal history of malignant neoplasm of breast: Secondary | ICD-10-CM

## 2020-01-01 LAB — URINE CULTURE
MICRO NUMBER:: 10017860
Result:: NO GROWTH
SPECIMEN QUALITY:: ADEQUATE

## 2020-01-01 NOTE — Progress Notes (Signed)
[name], female   DOB: 1951-05-30, 69 y.o.   MRN: [id number]   Virtual Visit via video Note  This visit type was conducted due to national recommendations for restrictions regarding the COVID-19 pandemic (e.g. social distancing).  This format is felt to be most appropriate for this patient at this time.  All issues noted in this document were discussed and addressed.  No physical exam was performed (except for noted visual exam findings with Video Visits).   I connected with Charity Devivo by a video enabled telemedicine application and verified that I am speaking with the correct person using two identifiers. Location patient: home Location provider: work  Persons participating in the virtual visit: patient, provider  The limitations, risks, security and privacy concerns of performing an evaluation and management service by telephone and the availability of in person appointments have been discussed.  The patient expressed understanding and agreed to proceed.   Reason for visit:  Work in appt   HPI: Reports she started noticing some urinary urgency and frequency.  Took an otc test - positive leukocytes - negative nitrites.  Taking cranberry extract.  No actual dysuria.  Symptoms are better today.  No fever. Eating.  No nausea or vomiting reported.  No abdominal pain or back pain reported.  Wanted to confirm no UTI. No vaginal problems. No bowel change reported.     ROS: See pertinent positives and negatives per HPI.  Past Medical History:  Diagnosis Date  . Basal cell carcinoma    [facility]  . Breast cancer ([place]) 12/13   s/p right lumpectomy, s/p chemo and XRT  . DES exposure in utero   . Hepatitis A   . History of chicken pox   . Hyperactive airway disease   . 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 Migraines   . Miscarriage    x3    Past Surgical History:  Procedure Laterality Date  . BASAL CELL CARCINOMA EXCISION     Multiple  .  BREAST BIOPSY  1975   Cyst  . BREAST LUMPECTOMY WITH AXILLARY LYMPH NODE DISSECTION  01/06/13, 01/22/13   Dr. Drue Dun, [facility], second surgery for pos margin  . CERVICAL FUSION  2001  . DILATION AND CURETTAGE OF UTERUS     x 3  . VAGINAL DELIVERY     x4    Family History  Problem Relation Age of Onset  . Pancreatic cancer Mother   . Cancer Mother        Pancreatic  . Arthritis Mother   . Hyperlipidemia Mother   . Hypertension Mother   . Heart attack Father   . Hyperlipidemia Father   . Hypertension Father   . Heart disease Father 28  . Colon cancer Other   . Cancer Other 50       Breast and Ovarian- dx 50's/Colon  . Birth defects Maternal Aunt        Breast - in 48's  . Stroke Maternal Grandmother   . Birth defects Maternal Aunt        Breast - in 60's    SOCIAL HX: reviewed.    Current Outpatient Medications:  .  albuterol (PROVENTIL HFA;VENTOLIN HFA) 108 (90 Base) MCG/ACT inhaler, Inhale 2 puffs into the lungs every 6 (six) hours as needed for wheezing or shortness of breath., Disp: 1 Inhaler, Rfl: 0 .  BIOTIN PO, Take 10,000 mg by mouth daily. , Disp: , Rfl:  .  butalbital-aspirin-caffeine (FIORINAL)  50-325-40 MG capsule, TAKE 1 CAPSULE TWICE DAILY AS NEEDED FORHEADACHE, Disp: 30 capsule, Rfl: 0 .  Cholecalciferol (VITAMIN D3) 125 MCG (5000 UT) CAPS, , Disp: , Rfl:  .  Glucosamine-Chondroitin (MOVE FREE PO), Take 1 capsule by mouth daily. Cartilage/boron/hyalauronic acid, Disp: , Rfl:  .  hydrOXYzine (ATARAX/VISTARIL) 10 MG tablet, TAKE ONE TABLET BY MOUTH EVERY DAY AS NEEDED FOR ITCHING, Disp: 30 tablet, Rfl: 0 .  ipratropium (ATROVENT) 0.06 % nasal spray, PLACE 2 SPRAYS INTO THE NOSE 2 TIMES DAILY, Disp: 30 mL, Rfl: 2 .  LORazepam (ATIVAN) 0.5 MG tablet, TAKE ONE TABLET BY MOUTH EVERY DAY AS NEEDED FOR ANXIETY, Disp: 30 tablet, Rfl: 0 .  MAGNESIUM BISGLYCINATE PO, Take 200 mg by mouth daily., Disp: , Rfl:  .  Multiple Minerals-Vitamins (CALCIUM CITRATE PLUS/MAGNESIUM  PO), Take 2 tablets by mouth 2 (two) times daily. CAL CITRATE 500-MAGNESIUM 80-ZINC-10, Disp: , Rfl:  .  Multiple Vitamins-Minerals (MULTIVITAMIN WOMENS 50+ ADV PO), Take 1 tablet by mouth daily., Disp: , Rfl:  .  Omega-3 Fatty Acids (OMEGA-3 FISH OIL PO), Take 1,280 mg by mouth 2 (two) times daily., Disp: , Rfl:  .  pantoprazole (PROTONIX) 20 MG tablet, TAKE ONE TABLET TWICE DAILY BEFORE A MEAL, Disp: 180 tablet, Rfl: 1 .  propranolol (INDERAL) 40 MG tablet, TAKE 1 TABLET BY MOUTH DAILY, Disp: 90 tablet, Rfl: 1 .  Riboflavin (VITAMIN B2 PO), Take 250 mg by mouth daily., Disp: , Rfl:  .  triamcinolone cream (KENALOG) 0.1 %, , Disp: , Rfl:  .  TURMERIC CURCUMIN PO, Take 2,250 mg by mouth 3 (three) times daily., Disp: , Rfl:  .  zolpidem (AMBIEN) 5 MG tablet, TAKE 1 TABLET AT BEDTIME AS NEEDED, Disp: 30 tablet, Rfl: 0  EXAM:  GENERAL: alert, oriented, appears well and in no acute distress  HEENT: atraumatic, conjunttiva clear, no obvious abnormalities on inspection of external nose and ears  NECK: normal movements of the head and neck  LUNGS: on inspection no signs of respiratory distress, breathing rate appears normal, no obvious gross SOB, gasping or wheezing  CV: no obvious cyanosis  PSYCH/NEURO: pleasant and cooperative, no obvious depression or anxiety, speech and thought processing grossly intact  ASSESSMENT AND PLAN:  Discussed the following assessment and plan:  Urinary urgency Urinary urgency as outlined.  Staying hydrated.  Urinalysis - ok.  Will send for culture to confirm no infection.  Hold abx.  Symptoms have improved.  Follow.  Call if any symptom change or worsening symptoms.    History of breast cancer Mammogram 01/14/19 - birads II.      I discussed the assessment and treatment plan with the patient. The patient was provided an opportunity to ask questions and all were answered. The patient agreed with the plan and demonstrated an understanding of the instructions.    The patient was advised to call back or seek an in-person evaluation if the symptoms worsen or if the condition fails to improve as anticipated.   Einar Pheasant, MD

## 2020-01-02 ENCOUNTER — Encounter: Payer: Self-pay | Admitting: Internal Medicine

## 2020-01-04 ENCOUNTER — Encounter: Payer: Self-pay | Admitting: Internal Medicine

## 2020-01-04 NOTE — Telephone Encounter (Signed)
If she is not having any symptoms, I would hold on testing this soon.  I think Wednesday is ok.  Keep [place] posted and let me know if needs anything.

## 2020-01-06 ENCOUNTER — Ambulatory Visit: Payer: PPO | Attending: Internal Medicine

## 2020-01-06 DIAGNOSIS — Z20822 Contact with and (suspected) exposure to covid-19: Secondary | ICD-10-CM | POA: Diagnosis not present

## 2020-01-07 LAB — NOVEL CORONAVIRUS, NAA: SARS-CoV-2, NAA: DETECTED — AB

## 2020-01-08 ENCOUNTER — Other Ambulatory Visit: Payer: Self-pay | Admitting: Internal Medicine

## 2020-01-08 NOTE — Telephone Encounter (Signed)
Last OV 01/01/20 Next OV none  Last refill 05/29/19

## 2020-01-08 NOTE — Telephone Encounter (Signed)
Pt called to let Dr. Nicki Reaper know that she did test Positive for Covid

## 2020-01-08 NOTE — Telephone Encounter (Signed)
Pt needs a refill on  butalbital-aspirin-caffeine sent to [facility]

## 2020-01-09 MED ORDER — BUTALBITAL-ASPIRIN-CAFFEINE 50-325-40 MG PO CAPS: ORAL_CAPSULE | ORAL | 0 refills | Status: DC

## 2020-01-09 NOTE — Telephone Encounter (Signed)
rx sent in for fioricet #30 with no refills.

## 2020-01-10 ENCOUNTER — Encounter: Payer: Self-pay | Admitting: Internal Medicine

## 2020-01-10 DIAGNOSIS — R3915 Urgency of urination: Secondary | ICD-10-CM | POA: Insufficient documentation

## 2020-01-10 NOTE — Assessment & Plan Note (Signed)
Urinary urgency as outlined.  Staying hydrated.  Urinalysis - ok.  Will send for culture to confirm no infection.  Hold abx.  Symptoms have improved.  Follow.  Call if any symptom change or worsening symptoms.

## 2020-01-10 NOTE — Assessment & Plan Note (Signed)
Mammogram 01/14/19 - birads II.

## 2020-01-20 DIAGNOSIS — M8580 Other specified disorders of bone density and structure, unspecified site: Secondary | ICD-10-CM | POA: Diagnosis not present

## 2020-01-20 DIAGNOSIS — Z1231 Encounter for screening mammogram for malignant neoplasm of breast: Secondary | ICD-10-CM | POA: Diagnosis not present

## 2020-01-20 DIAGNOSIS — Z9221 Personal history of antineoplastic chemotherapy: Secondary | ICD-10-CM | POA: Diagnosis not present

## 2020-01-20 DIAGNOSIS — Z7189 Other specified counseling: Secondary | ICD-10-CM | POA: Diagnosis not present

## 2020-01-20 DIAGNOSIS — Z853 Personal history of malignant neoplasm of breast: Secondary | ICD-10-CM | POA: Diagnosis not present

## 2020-01-21 ENCOUNTER — Encounter: Payer: Self-pay | Admitting: Internal Medicine

## 2020-02-20 ENCOUNTER — Other Ambulatory Visit: Payer: Self-pay | Admitting: Internal Medicine

## 2020-02-22 ENCOUNTER — Other Ambulatory Visit: Payer: Self-pay | Admitting: Internal Medicine

## 2020-02-22 NOTE — Telephone Encounter (Signed)
Refill request for ativan, last seen 01/01/2020, last filled 10-17-19.  Please advise.

## 2020-02-23 NOTE — Telephone Encounter (Signed)
rx sent in for lorazepam #30 with no refills.   

## 2020-03-14 ENCOUNTER — Ambulatory Visit: Payer: PPO | Attending: Internal Medicine

## 2020-03-14 DIAGNOSIS — Z23 Encounter for immunization: Secondary | ICD-10-CM

## 2020-03-14 NOTE — Progress Notes (Signed)
   Covid-19 Vaccination Clinic  Name:  Cindy Arnold    MRN: [id number] DOB: 02/18/1951  03/14/2020  [name] was observed post Covid-19 immunization for 15 minutes without incident. She was provided with Vaccine Information Sheet and instruction to access the V-Safe system.   [name] was instructed to call 911 with any severe reactions post vaccine: Marland Kitchen Difficulty breathing  . Swelling of face and throat  . A fast heartbeat  . A bad rash all over body  . Dizziness and weakness   Immunizations Administered    Name Date Dose VIS Date Route   Pfizer COVID-19 Vaccine 03/14/2020 12:33 PM 0.3 mL 12/04/2019 Intramuscular   Manufacturer: [place]   Lot: [id number]   [place]: [id number]

## 2020-04-06 ENCOUNTER — Ambulatory Visit: Payer: PPO | Attending: Internal Medicine

## 2020-04-06 DIAGNOSIS — Z23 Encounter for immunization: Secondary | ICD-10-CM

## 2020-04-06 NOTE — Progress Notes (Signed)
   Covid-19 Vaccination Clinic  Name:  Cindy Arnold    MRN: [id number] DOB: February 19, 1951  04/06/2020  [name] was observed post Covid-19 immunization for 15 minutes without incident. She was provided with Vaccine Information Sheet and instruction to access the V-Safe system.   [name] was instructed to call 911 with any severe reactions post vaccine: Marland Kitchen Difficulty breathing  . Swelling of face and throat  . A fast heartbeat  . A bad rash all over body  . Dizziness and weakness   Immunizations Administered    Name Date Dose VIS Date Route   Pfizer COVID-19 Vaccine 04/06/2020  3:22 PM 0.3 mL 12/04/2019 Intramuscular   Manufacturer: [place]   Lot: [id number]   [place]: [id number]

## 2020-04-21 DIAGNOSIS — G5603 Carpal tunnel syndrome, bilateral upper limbs: Secondary | ICD-10-CM | POA: Diagnosis not present

## 2020-04-27 ENCOUNTER — Ambulatory Visit (INDEPENDENT_AMBULATORY_CARE_PROVIDER_SITE_OTHER): Payer: PPO

## 2020-04-27 VITALS — Ht 68.0 in | Wt 146.0 lb

## 2020-04-27 DIAGNOSIS — Z1159 Encounter for screening for other viral diseases: Secondary | ICD-10-CM | POA: Diagnosis not present

## 2020-04-27 DIAGNOSIS — Z0000 Encounter for general adult medical examination without abnormal findings: Secondary | ICD-10-CM

## 2020-04-27 NOTE — Patient Instructions (Addendum)
  Ms. Obeid , Thank you for taking time to come for your Medicare Wellness Visit. I appreciate your ongoing commitment to your health goals. Please review the following plan we discussed and let me know if I can assist you in the future.   These are the goals we discussed: Goals      Patient Stated   . Advanced Care Planning (pt-stated)     I plan to complete advanced directive by June and already have the paperwork.        This is a list of the screening recommended for you and due dates:  Health Maintenance  Topic Date Due  .  Hepatitis C: One time screening is recommended by Center for Disease Control  (CDC) for  adults born from [birth year] through 1965.   Never done  . Flu Shot  07/24/2020  . Mammogram  01/14/2021  . Colon Cancer Screening  02/05/2022  . Tetanus Vaccine  07/09/2027  . DEXA scan (bone density measurement)  Completed  . COVID-19 Vaccine  Completed  . Pneumonia vaccines  Completed

## 2020-04-27 NOTE — Progress Notes (Addendum)
Subjective:   Cindy Arnold is a 69 y.o. female who presents for Medicare Annual (Subsequent) preventive examination.  Review of Systems:  No ROS.  Medicare Wellness Virtual Visit.  Visual/audio telehealth visit, UTA vital signs.   Ht/Wt provided. See social history for additional risk factors.   Cardiac Risk Factors include: advanced age (>71men, >77 women)     Objective:     Vitals: Ht 5\' 8"  (1.727 m)   Wt 146 lb (66.2 kg)   BMI 22.20 kg/m   Body mass index is 22.2 kg/m.  Advanced Directives 04/27/2020 04/27/2019  Does Patient Have a Medical Advance Directive? No Yes  Type of Advance Directive - [place];Living will  Does patient want to make changes to medical advance directive? - No - Guardian declined  Copy of [place] in Chart? - No - copy requested  Would patient like information on creating a medical advance directive? No - Patient declined -    Tobacco Social History   Tobacco Use  Smoking Status Never Smoker  Smokeless Tobacco Never Used     Counseling given: Not Answered   Clinical Intake:  Pre-visit preparation completed: Yes        Diabetes: No  How often do you need to have someone help you when you read instructions, pamphlets, or other written materials from your doctor or pharmacy?: 1 - Never  Interpreter Needed?: No     Past Medical History:  Diagnosis Date  . Basal cell carcinoma    multiple removed/[facility]  . Breast cancer ([place]) 12/13   s/p right lumpectomy, s/p chemo and XRT  . DES exposure in utero   . Hepatitis A   . History of chicken pox   . Hyperactive airway disease   . 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 Migraines   . Miscarriage    x3   Past Surgical History:  Procedure Laterality Date  . BASAL CELL CARCINOMA EXCISION     Multiple  . BREAST BIOPSY  1975   Cyst  . BREAST LUMPECTOMY WITH AXILLARY LYMPH NODE DISSECTION  01/06/13, 01/22/13   Dr. Drue Dun, [facility],  second surgery for pos margin  . CERVICAL FUSION  2001  . DILATION AND CURETTAGE OF UTERUS     x 3  . VAGINAL DELIVERY     x4   Family History  Problem Relation Age of Onset  . Pancreatic cancer Mother   . Cancer Mother        Pancreatic  . Arthritis Mother   . Hyperlipidemia Mother   . Hypertension Mother   . Heart attack Father   . Hyperlipidemia Father   . Hypertension Father   . Heart disease Father 2  . Colon cancer Other   . Cancer Other 50       Breast and Ovarian- dx 50's/Colon  . Birth defects Maternal Aunt        Breast - in 67's  . Stroke Maternal Grandmother   . Birth defects Maternal Aunt        Breast - in 67's   Social History   Socioeconomic History  . Marital status: Married    Spouse name: Not on file  . Number of children: 4  . Years of education: Not on file  . Highest education level: Not on file  Occupational History  . Occupation: Corporate treasurer - [employer]  Tobacco Use  . Smoking status: Never Smoker  . Smokeless tobacco:  Never Used  Substance and Sexual Activity  . Alcohol use: Yes    Alcohol/week: 0.0 standard drinks    Comment: 3 times a week  . Drug use: No  . Sexual activity: Not on file  Other Topics Concern  . Not on file  Social History Narrative   Lives in [place] with husband.  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    Social Determinants of Health   Financial Resource Strain:   . Difficulty of Paying Living Expenses:   Food Insecurity:   . Worried About Charity fundraiser in the Last Year:   . Arboriculturist in the Last Year:   Transportation Needs:   . Film/video editor (Medical):   Marland Kitchen Lack of Transportation (Non-Medical):   Physical Activity:   . Days of Exercise per Week:   . Minutes of Exercise per Session:   Stress:   . Feeling of Stress :   Social Connections:   . Frequency of Communication with Friends and Family:   . Frequency of Social Gatherings with Friends and Family:   . Attends Religious Services:     . Active Member of Clubs or Organizations:   . Attends Archivist Meetings:   Marland Kitchen Marital Status:     Outpatient Encounter Medications as of 04/27/2020  Medication Sig  . BIOTIN PO Take 10,000 mg by mouth daily.   . butalbital-aspirin-caffeine (FIORINAL) 50-325-40 MG capsule TAKE 1 CAPSULE TWICE DAILY AS NEEDED FORHEADACHE  . Cholecalciferol (VITAMIN D3) 125 MCG (5000 UT) CAPS   . Glucosamine-Chondroitin (MOVE FREE PO) Take 1 capsule by mouth daily. Cartilage/boron/hyalauronic acid  . hydrOXYzine (ATARAX/VISTARIL) 10 MG tablet TAKE ONE TABLET BY MOUTH EVERY DAY AS NEEDED FOR ITCHING  . ipratropium (ATROVENT) 0.06 % nasal spray SPRAY TWICE INTO EACH NOSTRIL TWICE DAILY  . LORazepam (ATIVAN) 0.5 MG tablet TAKE ONE TABLET BY MOUTH EVERY DAY AS NEEDED FOR ANXIETY  . MAGNESIUM BISGLYCINATE PO Take 200 mg by mouth daily.  . Multiple Minerals-Vitamins (CALCIUM CITRATE PLUS/MAGNESIUM PO) Take 2 tablets by mouth 2 (two) times daily. CAL CITRATE 500-MAGNESIUM 80-ZINC-10  . Multiple Vitamins-Minerals (MULTIVITAMIN WOMENS 50+ ADV PO) Take 1 tablet by mouth daily.  . Omega-3 Fatty Acids (OMEGA-3 FISH OIL PO) Take 1,280 mg by mouth 2 (two) times daily.  . pantoprazole (PROTONIX) 20 MG tablet TAKE ONE TABLET TWICE DAILY BEFORE A MEAL  . propranolol (INDERAL) 40 MG tablet TAKE 1 TABLET BY MOUTH DAILY  . Riboflavin (VITAMIN B2 PO) Take 250 mg by mouth daily.  Marland Kitchen triamcinolone cream (KENALOG) 0.1 %   . TURMERIC CURCUMIN PO Take 2,250 mg by mouth 3 (three) times daily.  Marland Kitchen zolpidem (AMBIEN) 5 MG tablet TAKE 1 TABLET AT BEDTIME AS NEEDED  . [DISCONTINUED] albuterol (PROVENTIL HFA;VENTOLIN HFA) 108 (90 Base) MCG/ACT inhaler Inhale 2 puffs into the lungs every 6 (six) hours as needed for wheezing or shortness of breath.   No facility-administered encounter medications on file as of 04/27/2020.    Activities of Daily Living In your present state of health, do you have any difficulty performing the  following activities: 04/27/2020  Hearing? N  Vision? N  Difficulty concentrating or making decisions? N  Walking or climbing stairs? N  Dressing or bathing? N  Doing errands, shopping? N  Preparing Food and eating ? N  Using the Toilet? N  In the past six months, have you accidently leaked urine? N  Do you have problems with loss of bowel control? N  Managing your Medications? N  Managing your Finances? N  Housekeeping or managing your Housekeeping? N  Some recent data might be hidden    Patient Care Team: Einar Pheasant, MD as PCP - General (Internal Medicine)    Assessment:   This is a routine wellness examination for [place].  Nurse connected with patient 04/27/20 at 11:30 AM EDT by a telephone enabled telemedicine application and verified that I am speaking with the correct person using two identifiers. Patient stated full name and DOB. Patient gave permission to continue with virtual visit. Patient's location was at home and Nurse's location was at [place] office.   Patient is alert and oriented x3. Patient denies difficulty focusing or concentrating. Patient likes to read, play math solitaire and other brain challenging exercises for brain health.   Health Maintenance Due: -Hepatitis C Screening- discussed; consent given  See completed HM at the end of note.   Eye: Visual acuity not assessed. Virtual visit.   Hearing: Demonstrates normal hearing during visit.  Safety:  Patient feels safe at home- yes Patient does have smoke detectors at home- yes Patient does wear sunscreen or protective clothing when in direct sunlight - yes Patient does wear seat belt when in a moving vehicle - yes Patient drives- yes Adequate lighting in walkways free from debris- yes Grab bars and handrails used as appropriate- yes Ambulates with an assistive device- no Cell phone on person when ambulating outside of the home-yes  Social: Alcohol intake - yes      Smoking history- never    Smokers in home? none Illicit drug use? none  Medication: Taking as directed and without issues.  Pill box in use -yes  Self managed - yes   Covid-19: Precautions and sickness symptoms discussed. Wears mask, social distancing, hand hygiene as appropriate.   Activities of Daily Living Patient denies needing assistance with: household chores, feeding themselves, getting from bed to chair, getting to the toilet, bathing/showering, dressing, managing money, or preparing meals.   Discussed the importance of a healthy diet, water intake and the benefits of aerobic exercise.   Physical activity- walking a couple of miles 5 times weekly  Diet:  Healthy Water: good intake Caffeine: 1 cup of coffee daily, iced tea 3x weekly  Other Providers Patient Care Team: Einar Pheasant, MD as PCP - General (Internal Medicine) Exercise Activities and Dietary recommendations Current Exercise Habits: Home exercise routine, Time (Minutes): 30, Frequency (Times/Week): 5, Weekly Exercise (Minutes/Week): 150, Intensity: Mild  Goals      Patient Stated   . Advanced Care Planning (pt-stated)     I plan to complete advanced directive by June and already have the paperwork.        Fall Risk Fall Risk  04/27/2020 04/27/2019 12/06/2017 11/29/2016 05/25/2016  Falls in the past year? 0 0 No No No  Follow up Falls evaluation completed - - - -   Timed Get Up and Go performed: no, virtual visit  Depression Screen PHQ 2/9 Scores 04/27/2020 04/27/2019 12/06/2017 05/15/2017  PHQ - 2 Score 0 0 0 0  PHQ- 9 Score - - - 0     Cognitive Function MMSE - Mini Mental State Exam 04/27/2020  Not completed: Unable to complete     6CIT Screen 04/27/2019  What Year? 0 points  What month? 0 points  What time? 0 points  Count back from 20 0 points  Months in reverse 0 points  Repeat phrase 0 points  Total Score 0  Immunization History  Administered Date(s) Administered  . Influenza Split 11/14/2011, 10/02/2014  .  Influenza, High Dose Seasonal PF 09/26/2018, 10/03/2019  . Influenza-Unspecified 10/22/2013, 08/26/2015, 10/03/2016  . PFIZER SARS-COV-2 Vaccination 03/14/2020, 04/06/2020  . Pneumococcal Conjugate-13 08/21/2017  . Pneumococcal Polysaccharide-23 11/05/2013, 12/27/2019  . Tdap 05/14/2011, 07/08/2017  . Zoster 10/25/2011  . Zoster Recombinat (Shingrix) 11/15/2017, 03/04/2018   Screening Tests Health Maintenance  Topic Date Due  . Hepatitis C Screening  Never done  . INFLUENZA VACCINE  07/24/2020  . MAMMOGRAM  01/14/2021  . COLONOSCOPY  02/05/2022  . TETANUS/TDAP  07/09/2027  . DEXA SCAN  Completed  . COVID-19 Vaccine  Completed  . PNA vac Low Risk Adult  Completed      Plan:   Keep all routine maintenance appointments.   Follow up 06/15/20 @ 9:30. Medication refill ambien, lorazepam, kenalog, hydroxyzine. Patient notes she does not need a refill prior to scheduled appointment.   -Hepatitis C screening- future lab ordered. Consent given.   Medicare Attestation I have personally reviewed: The patient's medical and social history Their use of alcohol, tobacco or illicit drugs Their current medications and supplements The patient's functional ability including ADLs,fall risks, home safety risks, cognitive, and hearing and visual impairment Diet and physical activities Evidence for depression   I have reviewed and discussed with patient certain preventive protocols, quality metrics, and best practice recommendations.      Varney Biles, LPN  [id number]   Reviewed above information.  Agree with assessment and plan.    Dr Nicki Reaper

## 2020-05-24 DIAGNOSIS — H2513 Age-related nuclear cataract, bilateral: Secondary | ICD-10-CM | POA: Diagnosis not present

## 2020-06-09 DIAGNOSIS — S70362A Insect bite (nonvenomous), left thigh, initial encounter: Secondary | ICD-10-CM | POA: Diagnosis not present

## 2020-06-09 DIAGNOSIS — D2272 Melanocytic nevi of left lower limb, including hip: Secondary | ICD-10-CM | POA: Diagnosis not present

## 2020-06-09 DIAGNOSIS — D2262 Melanocytic nevi of left upper limb, including shoulder: Secondary | ICD-10-CM | POA: Diagnosis not present

## 2020-06-09 DIAGNOSIS — L821 Other seborrheic keratosis: Secondary | ICD-10-CM | POA: Diagnosis not present

## 2020-06-09 DIAGNOSIS — L570 Actinic keratosis: Secondary | ICD-10-CM | POA: Diagnosis not present

## 2020-06-09 DIAGNOSIS — D2271 Melanocytic nevi of right lower limb, including hip: Secondary | ICD-10-CM | POA: Diagnosis not present

## 2020-06-09 DIAGNOSIS — D225 Melanocytic nevi of trunk: Secondary | ICD-10-CM | POA: Diagnosis not present

## 2020-06-09 DIAGNOSIS — D2261 Melanocytic nevi of right upper limb, including shoulder: Secondary | ICD-10-CM | POA: Diagnosis not present

## 2020-06-09 DIAGNOSIS — S70361A Insect bite (nonvenomous), right thigh, initial encounter: Secondary | ICD-10-CM | POA: Diagnosis not present

## 2020-06-15 ENCOUNTER — Ambulatory Visit (INDEPENDENT_AMBULATORY_CARE_PROVIDER_SITE_OTHER): Payer: PPO | Admitting: Internal Medicine

## 2020-06-15 ENCOUNTER — Other Ambulatory Visit: Payer: Self-pay

## 2020-06-15 VITALS — BP 102/60 | HR 63 | Temp 96.9°F | Resp 16 | Ht 68.0 in | Wt 146.8 lb

## 2020-06-15 DIAGNOSIS — Z853 Personal history of malignant neoplasm of breast: Secondary | ICD-10-CM

## 2020-06-15 DIAGNOSIS — N83202 Unspecified ovarian cyst, left side: Secondary | ICD-10-CM | POA: Diagnosis not present

## 2020-06-15 DIAGNOSIS — Z13220 Encounter for screening for lipoid disorders: Secondary | ICD-10-CM | POA: Diagnosis not present

## 2020-06-15 DIAGNOSIS — G4700 Insomnia, unspecified: Secondary | ICD-10-CM

## 2020-06-15 DIAGNOSIS — G43809 Other migraine, not intractable, without status migrainosus: Secondary | ICD-10-CM | POA: Diagnosis not present

## 2020-06-15 DIAGNOSIS — K219 Gastro-esophageal reflux disease without esophagitis: Secondary | ICD-10-CM

## 2020-06-15 NOTE — Progress Notes (Signed)
Patient ID: AMARIS DELAFUENTE, female   DOB: 1951-04-01, 69 y.o.   MRN: [id number]   Subjective:    Patient ID: [name], female    DOB: 1951/11/25, 69 y.o.   MRN: [id number]  HPI This visit occurred during the SARS-CoV-2 public health emergency.  Safety protocols were in place, including screening questions prior to the visit, additional usage of staff PPE, and extensive cleaning of exam room while observing appropriate contact time as indicated for disinfecting solutions.  Patient here for a scheduled follow up.  She reports she is doing well.  Had covid 12/2019.  No residual problems.  Taste has improved.  No chest tightness or trouble breathing.  Stays active. No chest pain or sob reported.  No abdominal pain or bowel change reported.  Takes 1/2 ambien prn to help with sleep.  Headaches stable.    Past Medical History:  Diagnosis Date  . Basal cell carcinoma    multiple removed/[facility]  . Breast cancer ([place]) 12/13   s/p right lumpectomy, s/p chemo and XRT  . DES exposure in utero   . Hepatitis A   . History of chicken pox   . Hyperactive airway disease   . 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 Migraines   . Miscarriage    x3   Past Surgical History:  Procedure Laterality Date  . BASAL CELL CARCINOMA EXCISION     Multiple  . BREAST BIOPSY  1975   Cyst  . BREAST LUMPECTOMY WITH AXILLARY LYMPH NODE DISSECTION  01/06/13, 01/22/13   Dr. Drue Dun, [facility], second surgery for pos margin  . CERVICAL FUSION  2001  . DILATION AND CURETTAGE OF UTERUS     x 3  . VAGINAL DELIVERY     x4   Family History  Problem Relation Age of Onset  . Pancreatic cancer Mother   . Cancer Mother        Pancreatic  . Arthritis Mother   . Hyperlipidemia Mother   . Hypertension Mother   . Heart attack Father   . Hyperlipidemia Father   . Hypertension Father   . Heart disease Father 70  . Colon cancer Other   . Cancer Other 50       Breast and Ovarian- dx 50's/Colon  . Birth defects  Maternal Aunt        Breast - in 71's  . Stroke Maternal Grandmother   . Birth defects Maternal Aunt        Breast - in 40's   Social History   Socioeconomic History  . Marital status: Married    Spouse name: Not on file  . Number of children: 4  . Years of education: Not on file  . Highest education level: Not on file  Occupational History  . Occupation: Corporate treasurer - [employer]  Tobacco Use  . Smoking status: Never Smoker  . Smokeless tobacco: Never Used  Substance and Sexual Activity  . Alcohol use: Yes    Alcohol/week: 0.0 standard drinks    Comment: 3 times a week  . Drug use: No  . Sexual activity: Not on file  Other Topics Concern  . Not on file  Social History Narrative   Lives in [place] with husband.  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    Social Determinants of Health   Financial Resource Strain:   . Difficulty of Paying Living Expenses:   Food Insecurity:   . Worried About Charity fundraiser in the Last  Year:   . Ran Out of Food in the Last Year:   Transportation Needs:   . Film/video editor (Medical):   Marland Kitchen Lack of Transportation (Non-Medical):   Physical Activity:   . Days of Exercise per Week:   . Minutes of Exercise per Session:   Stress:   . Feeling of Stress :   Social Connections:   . Frequency of Communication with Friends and Family:   . Frequency of Social Gatherings with Friends and Family:   . Attends Religious Services:   . Active Member of Clubs or Organizations:   . Attends Archivist Meetings:   Marland Kitchen Marital Status:     Outpatient Encounter Medications as of 06/15/2020  Medication Sig  . BIOTIN PO Take 10,000 mg by mouth daily.   . butalbital-aspirin-caffeine (FIORINAL) 50-325-40 MG capsule TAKE 1 CAPSULE TWICE DAILY AS NEEDED FORHEADACHE  . Cholecalciferol (VITAMIN D3) 125 MCG (5000 UT) CAPS   . Glucosamine-Chondroitin (MOVE FREE PO) Take 1 capsule by mouth daily. Cartilage/boron/hyalauronic acid  . hydrOXYzine  (ATARAX/VISTARIL) 10 MG tablet TAKE ONE TABLET BY MOUTH EVERY DAY AS NEEDED FOR ITCHING  . ipratropium (ATROVENT) 0.06 % nasal spray SPRAY TWICE INTO EACH NOSTRIL TWICE DAILY  . LORazepam (ATIVAN) 0.5 MG tablet TAKE ONE TABLET BY MOUTH EVERY DAY AS NEEDED FOR ANXIETY  . MAGNESIUM BISGLYCINATE PO Take 200 mg by mouth daily.  . Multiple Minerals-Vitamins (CALCIUM CITRATE PLUS/MAGNESIUM PO) Take 2 tablets by mouth 2 (two) times daily. CAL CITRATE 500-MAGNESIUM 80-ZINC-10  . Multiple Vitamins-Minerals (MULTIVITAMIN WOMENS 50+ ADV PO) Take 1 tablet by mouth daily.  . Omega-3 Fatty Acids (OMEGA-3 FISH OIL PO) Take 1,280 mg by mouth 2 (two) times daily.  . pantoprazole (PROTONIX) 20 MG tablet TAKE ONE TABLET TWICE DAILY BEFORE A MEAL  . propranolol (INDERAL) 40 MG tablet TAKE 1 TABLET BY MOUTH DAILY  . Riboflavin (VITAMIN B2 PO) Take 250 mg by mouth daily.  Marland Kitchen triamcinolone cream (KENALOG) 0.1 %   . TURMERIC CURCUMIN PO Take 2,250 mg by mouth 3 (three) times daily.  Marland Kitchen zolpidem (AMBIEN) 5 MG tablet TAKE 1 TABLET AT BEDTIME AS NEEDED   No facility-administered encounter medications on file as of 06/15/2020.    Review of Systems  Constitutional: Negative for appetite change and unexpected weight change.  HENT: Negative for congestion and sinus pressure.   Respiratory: Negative for cough, chest tightness and shortness of breath.   Cardiovascular: Negative for chest pain, palpitations and leg swelling.  Gastrointestinal: Negative for abdominal pain, diarrhea, nausea and vomiting.  Genitourinary: Negative for difficulty urinating and dysuria.  Musculoskeletal: Negative for joint swelling and myalgias.  Skin: Negative for color change and rash.  Neurological: Negative for dizziness, light-headedness and headaches.  Psychiatric/Behavioral: Negative for agitation and dysphoric mood.       Objective:    Physical Exam Constitutional:      General: She is not in acute distress.    Appearance: Normal  appearance.  HENT:     Head: Normocephalic and atraumatic.     Right Ear: External ear normal.     Left Ear: External ear normal.  Eyes:     General:        Right eye: No discharge.        Left eye: No discharge.     Conjunctiva/sclera: Conjunctivae normal.  Neck:     Thyroid: No thyromegaly.  Cardiovascular:     Rate and Rhythm: Normal rate and regular rhythm.  Pulmonary:  Effort: No respiratory distress.     Breath sounds: Normal breath sounds. No wheezing.  Abdominal:     General: Bowel sounds are normal.     Palpations: Abdomen is soft.     Tenderness: There is no abdominal tenderness.  Musculoskeletal:        General: No swelling or tenderness.     Cervical back: Neck supple. No tenderness.  Lymphadenopathy:     Cervical: No cervical adenopathy.  Skin:    Findings: No erythema or rash.  Neurological:     Mental Status: She is alert.  Psychiatric:        Mood and Affect: Mood normal.        Behavior: Behavior normal.     BP 102/60   Pulse 63   Temp (!) 96.9 F (36.1 C)   Resp 16   Ht 5\' 8"  (1.727 m)   Wt 146 lb 12.8 oz (66.6 kg)   SpO2 98%   BMI 22.32 kg/m  Wt Readings from Last 3 Encounters:  06/15/20 146 lb 12.8 oz (66.6 kg)  04/27/20 146 lb (66.2 kg)  02/26/19 146 lb 9.6 oz (66.5 kg)     Lab Results  Component Value Date   WBC 9.4 02/12/2019   HGB 13.6 02/12/2019   HCT 39.3 02/12/2019   PLT 263.0 02/12/2019   GLUCOSE 85 02/12/2019   CHOL 175 02/12/2019   TRIG 69.0 02/12/2019   HDL 65.80 02/12/2019   LDLCALC 95 02/12/2019   ALT 20 02/12/2019   AST 18 02/12/2019   NA 139 02/12/2019   K 3.7 02/12/2019   CL 103 02/12/2019   CREATININE 0.88 02/12/2019   BUN 14 02/12/2019   CO2 30 02/12/2019   TSH 3.39 02/12/2019   MICROALBUR <0.7 11/14/2015    US Transvaginal Non-OB  Result Date: 08/20/2018 CLINICAL DATA:  Follow-up examination for left ovarian cyst. EXAM: TRANSABDOMINAL AND TRANSVAGINAL ULTRASOUND OF PELVIS TECHNIQUE: Both  transabdominal and transvaginal ultrasound examinations of the pelvis were performed. Transabdominal technique was performed for global imaging of the pelvis including uterus, ovaries, adnexal regions, and pelvic cul-de-sac. It was necessary to proceed with endovaginal exam following the transabdominal exam to visualize the uterus, endometrium, and ovaries. COMPARISON:  Prior ultrasound from 11/11/2014 FINDINGS: Uterus Measurements: 3.4 x 1.6 x 4.0 cm. No fibroids or other mass visualized. Endometrium Thickness: 4.5 mm.  No focal abnormality visualized. Right ovary Not visualized.  No adnexal mass. Left ovary Measurements: 3.6 x 3.4 x 3.4 cm. Simple unilocular cyst measuring 3.1 x 3.0 x 3.3 cm is seen (previously 2.4 x 2.3 x 2.6 cm). No internal vascularity, solid nodularity, or significant internal complexity. Other findings No abnormal free fluid. IMPRESSION: 1. 3.3 cm left ovarian cyst, increased in size relative to most recent ultrasound from 11/11/2014, previously 2.6 cm. While this is most likely benign, continued surveillance is warranted in this postmenopausal patient. Follow up ultrasound is recommended in 1 year according to the Society of Radiologists in [place] Statement (D Janae Bridgeman al. Management of Asymptomatic Ovarian and Other Adnexal Cysts Imaged at [place]: Society of Radiologists in [place] Statement 2010. Radiology 256 (Sept 2010): 943-954.). 2. Nonvisualization of the right ovary. 3. Normal sonographic appearance of the uterus and endometrium. Electronically Signed   By: Jeannine Boga M.D.   On: 08/20/2018 23:31   US Pelvis Complete  Result Date: 08/20/2018 CLINICAL DATA:  Follow-up examination for left ovarian cyst. EXAM: TRANSABDOMINAL AND TRANSVAGINAL ULTRASOUND OF PELVIS TECHNIQUE: Both transabdominal  and transvaginal ultrasound examinations of the pelvis were performed. Transabdominal technique was performed for global imaging of  the pelvis including uterus, ovaries, adnexal regions, and pelvic cul-de-sac. It was necessary to proceed with endovaginal exam following the transabdominal exam to visualize the uterus, endometrium, and ovaries. COMPARISON:  Prior ultrasound from 11/11/2014 FINDINGS: Uterus Measurements: 3.4 x 1.6 x 4.0 cm. No fibroids or other mass visualized. Endometrium Thickness: 4.5 mm.  No focal abnormality visualized. Right ovary Not visualized.  No adnexal mass. Left ovary Measurements: 3.6 x 3.4 x 3.4 cm. Simple unilocular cyst measuring 3.1 x 3.0 x 3.3 cm is seen (previously 2.4 x 2.3 x 2.6 cm). No internal vascularity, solid nodularity, or significant internal complexity. Other findings No abnormal free fluid. IMPRESSION: 1. 3.3 cm left ovarian cyst, increased in size relative to most recent ultrasound from 11/11/2014, previously 2.6 cm. While this is most likely benign, continued surveillance is warranted in this postmenopausal patient. Follow up ultrasound is recommended in 1 year according to the Society of Radiologists in [place] Statement (D Janae Bridgeman al. Management of Asymptomatic Ovarian and Other Adnexal Cysts Imaged at [place]: Society of Radiologists in [place] Statement 2010. Radiology 256 (Sept 2010): 943-954.). 2. Nonvisualization of the right ovary. 3. Normal sonographic appearance of the uterus and endometrium. Electronically Signed   By: Jeannine Boga M.D.   On: 08/20/2018 23:31       Assessment & Plan:   Problem List Items Addressed This Visit    GERD (gastroesophageal reflux disease)    No upper symptoms reported.  On protonix.  Follow.        Relevant Orders   CBC with Differential/Platelet   Comprehensive metabolic panel   TSH   History of breast cancer    Mammogram 01/20/20 - [facility] II.        Insomnia    Controlled with prn ambien.  Follow.        Migraines    Has fiorinal to take prn.  On propranolol.  Stable.         Ovarian cyst    Seeing gyn.  Postponed her visit secondary to covid - per note.  Needs to reschedule.         Other Visit Diagnoses    Screening cholesterol level    -  Primary   Relevant Orders   Lipid panel       Einar Pheasant, MD

## 2020-06-20 ENCOUNTER — Encounter: Payer: Self-pay | Admitting: Internal Medicine

## 2020-06-20 NOTE — Assessment & Plan Note (Signed)
Seeing gyn.  Postponed her visit secondary to covid - per note.  Needs to reschedule.

## 2020-06-20 NOTE — Assessment & Plan Note (Signed)
No upper symptoms reported.  On protonix.  Follow.  

## 2020-06-20 NOTE — Assessment & Plan Note (Signed)
Mammogram 01/20/20 - [facility]ads II.  

## 2020-06-20 NOTE — Assessment & Plan Note (Signed)
Controlled with prn ambien.  Follow.

## 2020-06-20 NOTE — Assessment & Plan Note (Signed)
Has fiorinal to take prn.  On propranolol.  Stable.

## 2020-06-25 ENCOUNTER — Other Ambulatory Visit: Payer: Self-pay | Admitting: Internal Medicine

## 2020-07-07 ENCOUNTER — Encounter: Payer: Self-pay | Admitting: Internal Medicine

## 2020-07-07 ENCOUNTER — Telehealth: Payer: Self-pay

## 2020-07-07 ENCOUNTER — Other Ambulatory Visit: Payer: Self-pay | Admitting: Internal Medicine

## 2020-07-16 ENCOUNTER — Other Ambulatory Visit: Payer: Self-pay | Admitting: Internal Medicine

## 2020-07-28 ENCOUNTER — Other Ambulatory Visit: Payer: Self-pay | Admitting: Internal Medicine

## 2020-09-13 ENCOUNTER — Other Ambulatory Visit (INDEPENDENT_AMBULATORY_CARE_PROVIDER_SITE_OTHER): Payer: PPO

## 2020-09-13 ENCOUNTER — Other Ambulatory Visit: Payer: Self-pay

## 2020-09-13 DIAGNOSIS — Z13220 Encounter for screening for lipoid disorders: Secondary | ICD-10-CM | POA: Diagnosis not present

## 2020-09-13 DIAGNOSIS — K219 Gastro-esophageal reflux disease without esophagitis: Secondary | ICD-10-CM

## 2020-09-13 DIAGNOSIS — Z1159 Encounter for screening for other viral diseases: Secondary | ICD-10-CM | POA: Diagnosis not present

## 2020-09-13 LAB — CBC WITH DIFFERENTIAL/PLATELET
Basophils Absolute: 0.1 10*3/uL (ref 0.0–0.1)
Basophils Relative: 2 % (ref 0.0–3.0)
Eosinophils Absolute: 0.1 10*3/uL (ref 0.0–0.7)
Eosinophils Relative: 2.4 % (ref 0.0–5.0)
HCT: 40.2 % (ref 36.0–46.0)
Hemoglobin: 13.9 g/dL (ref 12.0–15.0)
Lymphocytes Relative: 40.4 % (ref 12.0–46.0)
Lymphs Abs: 2 10*3/uL (ref 0.7–4.0)
MCHC: 34.5 g/dL (ref 30.0–36.0)
MCV: 93 fl (ref 78.0–100.0)
Monocytes Absolute: 0.5 10*3/uL (ref 0.1–1.0)
Monocytes Relative: 9.3 % (ref 3.0–12.0)
Neutro Abs: 2.2 10*3/uL (ref 1.4–7.7)
Neutrophils Relative %: 45.9 % (ref 43.0–77.0)
Platelets: 265 10*3/uL (ref 150.0–400.0)
RBC: 4.33 Mil/uL (ref 3.87–5.11)
RDW: 12.4 % (ref 11.5–15.5)
WBC: 4.9 10*3/uL (ref 4.0–10.5)

## 2020-09-13 LAB — COMPREHENSIVE METABOLIC PANEL
ALT: 19 U/L (ref 0–35)
AST: 21 U/L (ref 0–37)
Albumin: 4.6 g/dL (ref 3.5–5.2)
Alkaline Phosphatase: 72 U/L (ref 39–117)
BUN: 13 mg/dL (ref 6–23)
CO2: 30 mEq/L (ref 19–32)
Calcium: 9.5 mg/dL (ref 8.4–10.5)
Chloride: 105 mEq/L (ref 96–112)
Creatinine, Ser: 0.93 mg/dL (ref 0.40–1.20)
GFR: 59.89 mL/min — ABNORMAL LOW (ref >60.00)
Glucose, Bld: 93 mg/dL (ref 70–99)
Potassium: 3.6 mEq/L (ref 3.5–5.1)
Sodium: 142 mEq/L (ref 135–145)
Total Bilirubin: 0.5 mg/dL (ref 0.2–1.2)
Total Protein: 7.3 g/dL (ref 6.0–8.3)

## 2020-09-13 LAB — LIPID PANEL
Cholesterol: 229 mg/dL — ABNORMAL HIGH (ref 0–200)
HDL: 76.3 mg/dL (ref >39.00)
LDL Cholesterol: 138 mg/dL — ABNORMAL HIGH (ref 0–99)
NonHDL: 153.1
Total CHOL/HDL Ratio: 3
Triglycerides: 78 mg/dL (ref 0.0–149.0)
VLDL: 15.6 mg/dL (ref 0.0–40.0)

## 2020-09-13 LAB — TSH: TSH: 5.58 u[IU]/mL — ABNORMAL HIGH (ref 0.35–4.50)

## 2020-09-14 LAB — HEPATITIS C ANTIBODY
Hepatitis C Ab: NONREACTIVE
SIGNAL TO CUT-OFF: 0.01 (ref <1.00)

## 2020-09-15 ENCOUNTER — Ambulatory Visit (INDEPENDENT_AMBULATORY_CARE_PROVIDER_SITE_OTHER): Payer: PPO | Admitting: Internal Medicine

## 2020-09-15 ENCOUNTER — Other Ambulatory Visit: Payer: Self-pay

## 2020-09-15 VITALS — BP 116/72 | HR 77 | Temp 98.0°F | Resp 16 | Ht 68.0 in | Wt 144.6 lb

## 2020-09-15 DIAGNOSIS — Z23 Encounter for immunization: Secondary | ICD-10-CM

## 2020-09-15 DIAGNOSIS — J4520 Mild intermittent asthma, uncomplicated: Secondary | ICD-10-CM

## 2020-09-15 DIAGNOSIS — G43809 Other migraine, not intractable, without status migrainosus: Secondary | ICD-10-CM

## 2020-09-15 DIAGNOSIS — K219 Gastro-esophageal reflux disease without esophagitis: Secondary | ICD-10-CM

## 2020-09-15 DIAGNOSIS — Z853 Personal history of malignant neoplasm of breast: Secondary | ICD-10-CM | POA: Diagnosis not present

## 2020-09-15 DIAGNOSIS — Z0000 Encounter for general adult medical examination without abnormal findings: Secondary | ICD-10-CM | POA: Diagnosis not present

## 2020-09-15 NOTE — Progress Notes (Signed)
[name], female   DOB: 02-Jul-1951, 69 y.o.   MRN: [id number]   Subjective:    [name], female    DOB: 1951-06-01, 69 y.o.   MRN: [id number]  HPI This visit occurred during the SARS-CoV-2 public health emergency.  Safety protocols were in place, including screening questions prior to the visit, additional usage of staff PPE, and extensive cleaning of exam room while observing appropriate contact time as indicated for disinfecting solutions.  Patient here for her physical exam.  She reports she is doing relatively well.  Appears to be handling stress well.  Labor Day - stepped in hole (in the ocean).  Has been going to [facility].  Stable.  Tries to stay active.  No chest pain or sob reported.  No abdominal pain or bowel change reported.  No increase in headaches reported.    Past Medical History:  Diagnosis Date  . Basal cell carcinoma    multiple removed/[facility]  . Breast cancer ([place]) 12/13   s/p right lumpectomy, s/p chemo and XRT  . DES exposure in utero   . Hepatitis A   . History of chicken pox   . Hyperactive airway disease   . 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 Migraines   . Miscarriage    x3   Past Surgical History:  Procedure Laterality Date  . BASAL CELL CARCINOMA EXCISION     Multiple  . BREAST BIOPSY  1975   Cyst  . BREAST LUMPECTOMY WITH AXILLARY LYMPH NODE DISSECTION  01/06/13, 01/22/13   Dr. Drue Dun, [facility], second surgery for pos margin  . CERVICAL FUSION  2001  . DILATION AND CURETTAGE OF UTERUS     x 3  . VAGINAL DELIVERY     x4   Family History  Problem Relation Age of Onset  . Pancreatic cancer Mother   . Cancer Mother        Pancreatic  . Arthritis Mother   . Hyperlipidemia Mother   . Hypertension Mother   . Heart attack Father   . Hyperlipidemia Father   . Hypertension Father   . Heart disease Father 19  . Colon cancer Other   . Cancer Other 50       Breast and Ovarian- dx 50's/Colon  . Birth  defects Maternal Aunt        Breast - in 16's  . Stroke Maternal Grandmother   . Birth defects Maternal Aunt        Breast - in 94's   Social History   Socioeconomic History  . Marital status: Married    Spouse name: Not on file  . Number of children: 4  . Years of education: Not on file  . Highest education level: Not on file  Occupational History  . Occupation: Corporate treasurer - [employer]  Tobacco Use  . Smoking status: Never Smoker  . Smokeless tobacco: Never Used  Substance and Sexual Activity  . Alcohol use: Yes    Alcohol/week: 0.0 standard drinks    Comment: 3 times a week  . Drug use: No  . Sexual activity: Not on file  Other Topics Concern  . Not on file  Social History Narrative   Lives in [place] with husband.  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    Social Determinants of Health   Financial Resource Strain:   . Difficulty of Paying Living Expenses: Not on file  Food Insecurity:   . Worried About Running  Out of Food in the Last Year: Not on file  . Ran Out of Food in the Last Year: Not on file  Transportation Needs:   . Lack of Transportation (Medical): Not on file  . Lack of Transportation (Non-Medical): Not on file  Physical Activity:   . Days of Exercise per Week: Not on file  . Minutes of Exercise per Session: Not on file  Stress:   . Feeling of Stress : Not on file  Social Connections:   . Frequency of Communication with Friends and Family: Not on file  . Frequency of Social Gatherings with Friends and Family: Not on file  . Attends Religious Services: Not on file  . Active Member of Clubs or Organizations: Not on file  . Attends Archivist Meetings: Not on file  . Marital Status: Not on file    Outpatient Encounter Medications as of 09/15/2020  Medication Sig  . BIOTIN PO Take 10,000 mg by mouth daily.   . butalbital-aspirin-caffeine (FIORINAL) 50-325-40 MG capsule TAKE 1 CAPSULE BY MOUTH TWICE DAILY AS NEEDED FOR HEADACHE  .  Cholecalciferol (VITAMIN D3) 125 MCG (5000 UT) CAPS   . Glucosamine-Chondroitin (MOVE FREE PO) Take 1 capsule by mouth daily. Cartilage/boron/hyalauronic acid  . ipratropium (ATROVENT) 0.06 % nasal spray SPRAY TWICE INTO EACH NOSTRIL TWICE DAILY  . MAGNESIUM BISGLYCINATE PO Take 200 mg by mouth daily.  . Multiple Minerals-Vitamins (CALCIUM CITRATE PLUS/MAGNESIUM PO) Take 2 tablets by mouth 2 (two) times daily. CAL CITRATE 500-MAGNESIUM 80-ZINC-10  . Multiple Vitamins-Minerals (MULTIVITAMIN WOMENS 50+ ADV PO) Take 1 tablet by mouth daily.  . Omega-3 Fatty Acids (OMEGA-3 FISH OIL PO) Take 1,280 mg by mouth 2 (two) times daily.  . pantoprazole (PROTONIX) 20 MG tablet TAKE ONE TABLET TWICE DAILY BEFORE A MEAL  . propranolol (INDERAL) 40 MG tablet TAKE 1 TABLET BY MOUTH DAILY  . Riboflavin (VITAMIN B2 PO) Take 250 mg by mouth daily.  Marland Kitchen triamcinolone cream (KENALOG) 0.1 %   . TURMERIC CURCUMIN PO Take 2,250 mg by mouth 3 (three) times daily.  Marland Kitchen zolpidem (AMBIEN) 5 MG tablet TAKE 1 TABLET AT BEDTIME AS NEEDED  . [DISCONTINUED] hydrOXYzine (ATARAX/VISTARIL) 10 MG tablet TAKE ONE TABLET BY MOUTH EVERY DAY AS NEEDED FOR ITCHING  . [DISCONTINUED] LORazepam (ATIVAN) 0.5 MG tablet TAKE ONE TABLET BY MOUTH EVERY DAY AS NEEDED FOR ANXIETY   No facility-administered encounter medications on file as of 09/15/2020.    Review of Systems  Constitutional: Negative for appetite change and unexpected weight change.  HENT: Negative for congestion and sinus pressure.   Eyes: Negative for pain and visual disturbance.  Respiratory: Negative for cough, chest tightness and shortness of breath.   Cardiovascular: Negative for chest pain, palpitations and leg swelling.  Gastrointestinal: Negative for abdominal pain, diarrhea, nausea and vomiting.  Genitourinary: Negative for difficulty urinating and dysuria.  Musculoskeletal: Negative for joint swelling and myalgias.  Skin: Negative for color change and rash.    Neurological: Negative for dizziness and light-headedness.       No increase in headaches reported.   Hematological: Negative for adenopathy. Does not bruise/bleed easily.  Psychiatric/Behavioral: Negative for agitation and dysphoric mood.       Objective:    Physical Exam Vitals reviewed.  Constitutional:      General: She is not in acute distress.    Appearance: Normal appearance. She is well-developed.  HENT:     Head: Normocephalic and atraumatic.     Right Ear: External ear  normal.     Left Ear: External ear normal.  Eyes:     General: No scleral icterus.       Right eye: No discharge.        Left eye: No discharge.     Conjunctiva/sclera: Conjunctivae normal.  Neck:     Thyroid: No thyromegaly.  Cardiovascular:     Rate and Rhythm: Normal rate and regular rhythm.  Pulmonary:     Effort: No tachypnea, accessory muscle usage or respiratory distress.     Breath sounds: Normal breath sounds. No decreased breath sounds or wheezing.  Chest:     Breasts:        Right: No inverted nipple, mass, nipple discharge or tenderness (no axillary adenopathy).        Left: No inverted nipple, mass, nipple discharge or tenderness (no axilarry adenopathy).  Abdominal:     General: Bowel sounds are normal.     Palpations: Abdomen is soft.     Tenderness: There is no abdominal tenderness.  Musculoskeletal:        General: No swelling or tenderness.     Cervical back: Neck supple. No tenderness.  Lymphadenopathy:     Cervical: No cervical adenopathy.  Skin:    Findings: No erythema or rash.  Neurological:     Mental Status: She is alert and oriented to person, place, and time.  Psychiatric:        Mood and Affect: Mood normal.        Behavior: Behavior normal.     BP 116/72   Pulse 77   Temp 98 F (36.7 C) (Oral)   Resp 16   Ht 5\' 8"  (1.727 m)   Wt 144 lb 9.6 oz (65.6 kg)   SpO2 98%   BMI 21.99 kg/m  Wt Readings from Last 3 Encounters:  09/15/20 144 lb 9.6 oz (65.6  kg)  06/15/20 146 lb 12.8 oz (66.6 kg)  04/27/20 146 lb (66.2 kg)     Lab Results  Component Value Date   WBC 4.9 09/13/2020   HGB 13.9 09/13/2020   HCT 40.2 09/13/2020   PLT 265.0 09/13/2020   GLUCOSE 93 09/13/2020   CHOL 229 (H) 09/13/2020   TRIG 78.0 09/13/2020   HDL 76.30 09/13/2020   LDLCALC 138 (H) 09/13/2020   ALT 19 09/13/2020   AST 21 09/13/2020   NA 142 09/13/2020   K 3.6 09/13/2020   CL 105 09/13/2020   CREATININE 0.93 09/13/2020   BUN 13 09/13/2020   CO2 30 09/13/2020   TSH 5.58 (H) 09/13/2020   MICROALBUR <0.7 11/14/2015    US Transvaginal Non-OB  Result Date: 08/20/2018 CLINICAL DATA:  Follow-up examination for left ovarian cyst. EXAM: TRANSABDOMINAL AND TRANSVAGINAL ULTRASOUND OF PELVIS TECHNIQUE: Both transabdominal and transvaginal ultrasound examinations of the pelvis were performed. Transabdominal technique was performed for global imaging of the pelvis including uterus, ovaries, adnexal regions, and pelvic cul-de-sac. It was necessary to proceed with endovaginal exam following the transabdominal exam to visualize the uterus, endometrium, and ovaries. COMPARISON:  Prior ultrasound from 11/11/2014 FINDINGS: Uterus Measurements: 3.4 x 1.6 x 4.0 cm. No fibroids or other mass visualized. Endometrium Thickness: 4.5 mm.  No focal abnormality visualized. Right ovary Not visualized.  No adnexal mass. Left ovary Measurements: 3.6 x 3.4 x 3.4 cm. Simple unilocular cyst measuring 3.1 x 3.0 x 3.3 cm is seen (previously 2.4 x 2.3 x 2.6 cm). No internal vascularity, solid nodularity, or significant internal complexity. Other findings No abnormal  free fluid. IMPRESSION: 1. 3.3 cm left ovarian cyst, increased in size relative to most recent ultrasound from 11/11/2014, previously 2.6 cm. While this is most likely benign, continued surveillance is warranted in this postmenopausal patient. Follow up ultrasound is recommended in 1 year according to the Society of Radiologists in  [place] Statement (D Janae Bridgeman al. Management of Asymptomatic Ovarian and Other Adnexal Cysts Imaged at [place]: Society of Radiologists in [place] Statement 2010. Radiology 256 (Sept 2010): 943-954.). 2. Nonvisualization of the right ovary. 3. Normal sonographic appearance of the uterus and endometrium. Electronically Signed   By: Jeannine Boga M.D.   On: 08/20/2018 23:31   US Pelvis Complete  Result Date: 08/20/2018 CLINICAL DATA:  Follow-up examination for left ovarian cyst. EXAM: TRANSABDOMINAL AND TRANSVAGINAL ULTRASOUND OF PELVIS TECHNIQUE: Both transabdominal and transvaginal ultrasound examinations of the pelvis were performed. Transabdominal technique was performed for global imaging of the pelvis including uterus, ovaries, adnexal regions, and pelvic cul-de-sac. It was necessary to proceed with endovaginal exam following the transabdominal exam to visualize the uterus, endometrium, and ovaries. COMPARISON:  Prior ultrasound from 11/11/2014 FINDINGS: Uterus Measurements: 3.4 x 1.6 x 4.0 cm. No fibroids or other mass visualized. Endometrium Thickness: 4.5 mm.  No focal abnormality visualized. Right ovary Not visualized.  No adnexal mass. Left ovary Measurements: 3.6 x 3.4 x 3.4 cm. Simple unilocular cyst measuring 3.1 x 3.0 x 3.3 cm is seen (previously 2.4 x 2.3 x 2.6 cm). No internal vascularity, solid nodularity, or significant internal complexity. Other findings No abnormal free fluid. IMPRESSION: 1. 3.3 cm left ovarian cyst, increased in size relative to most recent ultrasound from 11/11/2014, previously 2.6 cm. While this is most likely benign, continued surveillance is warranted in this postmenopausal patient. Follow up ultrasound is recommended in 1 year according to the Society of Radiologists in [place] Statement (D Janae Bridgeman al. Management of Asymptomatic Ovarian and Other Adnexal Cysts Imaged at [place]: Society of  Radiologists in [place] Statement 2010. Radiology 256 (Sept 2010): 943-954.). 2. Nonvisualization of the right ovary. 3. Normal sonographic appearance of the uterus and endometrium. Electronically Signed   By: Jeannine Boga M.D.   On: 08/20/2018 23:31       Assessment & Plan:   Problem List Items Addressed This Visit    Migraines    Appears to be stable.       History of breast cancer    Mammogram 01/20/20 - Birads II.       Healthcare maintenance    Physical today 09/15/20.  PAP 02/04/18 changes c/w atrophy.  Negative HPV.  Mammogram 01/20/20 - Birads II.  Colonoscopy (Dr Tiffany Kocher) - 2018.        GERD (gastroesophageal reflux disease)    No upper symptoms reported.  On protonix       Asthma    Breathing stable.        Other Visit Diagnoses    Routine general medical examination at a health care facility    -  Primary   Need for immunization against influenza       Relevant Orders   Flu Vaccine QUAD High Dose(Fluad) (Completed)       Einar Pheasant, MD

## 2020-09-15 NOTE — Assessment & Plan Note (Addendum)
Physical today 09/15/20.  PAP 02/04/18 changes c/w atrophy.  Negative HPV.  Mammogram 01/20/20 - Birads II.  Colonoscopy (Dr Tiffany Kocher) - 2018.

## 2020-09-20 ENCOUNTER — Telehealth: Payer: Self-pay | Admitting: Internal Medicine

## 2020-09-20 MED ORDER — HYDROXYZINE HCL 10 MG PO TABS: ORAL_TABLET | ORAL | 1 refills | Status: DC

## 2020-09-20 MED ORDER — LORAZEPAM 0.5 MG PO TABS
ORAL_TABLET | ORAL | 0 refills | Status: DC
Start: 2020-09-20 — End: 2020-10-13

## 2020-09-20 MED ORDER — HYDROXYZINE HCL 10 MG PO TABS
ORAL_TABLET | ORAL | 1 refills | Status: DC
Start: 2020-09-20 — End: 2022-02-06

## 2020-09-20 NOTE — Telephone Encounter (Signed)
Rx sent in for lorazepam and hydroxyzine.

## 2020-09-20 NOTE — Telephone Encounter (Signed)
Left detailed message for patient.

## 2020-09-20 NOTE — Telephone Encounter (Signed)
Pt needs refill on LORazepam (ATIVAN) 0.5 MG tablet 30 pills-pt needs ASAP sent to [facility] and pt also needs hydrOXYzine (ATARAX/VISTARIL) 10 MG tablet

## 2020-09-20 NOTE — Telephone Encounter (Signed)
rx sent in for lorazepam and hydroxyzine.

## 2020-09-22 ENCOUNTER — Encounter: Payer: Self-pay | Admitting: Internal Medicine

## 2020-09-23 NOTE — Telephone Encounter (Signed)
LMTCB

## 2020-09-23 NOTE — Telephone Encounter (Signed)
The reason why #10 was sent in was when I looked up on PDMP review her last rx was for #10 - so I refilled the same.  It appears in reviewing the medication history - the #10 was sent in by another physician in this office previously and I sent in because that was the previous rx.  If she gets to where she is needing the lorazepam every day, then I need to see her because she will need different treatment.  Can let us know when out of these and can send in rx for #30.  Let me know if a problem.

## 2020-09-23 NOTE — Telephone Encounter (Signed)
It looks like she has gotten 30 Lorazepam in the past. I wasn't sure if there was a reason she only got 10 pills?

## 2020-09-25 ENCOUNTER — Telehealth: Payer: Self-pay | Admitting: Internal Medicine

## 2020-09-25 ENCOUNTER — Encounter: Payer: Self-pay | Admitting: Internal Medicine

## 2020-09-25 NOTE — Assessment & Plan Note (Signed)
No upper symptoms reported.  On protonix.   

## 2020-09-25 NOTE — Assessment & Plan Note (Signed)
Appears to be stable.  

## 2020-09-25 NOTE — Telephone Encounter (Signed)
Please notify pt that in reviewing her outside records, she previous saw Dr Leonides Schanz for f/u ovarian cyst.  Her last note, states pt rescheduled due to covid.  Cyst probably benign, but did want to do a f/u pelvic ultrasound to confirm stable.  Needs a f/u appt scheduled with gyn.  If agreeable, let me know and I can place order for new referral.

## 2020-09-25 NOTE — Assessment & Plan Note (Signed)
Breathing stable.

## 2020-09-25 NOTE — Assessment & Plan Note (Signed)
Mammogram 01/20/20 - Birads II.

## 2020-09-28 NOTE — Telephone Encounter (Signed)
Explained below to patient. She was ok with this. She is also ok to switch to something different if needed but does not use the lorazepam daily. Pt is going to send my chart message when she gets low on this rx to remind [place] to switch back to 30 pills instead of 10 when refilled.

## 2020-09-28 NOTE — Telephone Encounter (Signed)
Pt is okay to do ultrasound and f/u with gyn.

## 2020-09-29 ENCOUNTER — Other Ambulatory Visit: Payer: Self-pay | Admitting: Internal Medicine

## 2020-09-29 DIAGNOSIS — N83202 Unspecified ovarian cyst, left side: Secondary | ICD-10-CM

## 2020-09-29 NOTE — Telephone Encounter (Signed)
LMTCB

## 2020-09-29 NOTE — Progress Notes (Signed)
Order placed for gyn referral.  

## 2020-09-29 NOTE — Telephone Encounter (Signed)
Order placed for gyn referral. Someone should be contacting her with an appt date and time.  They will do ultrasound through gyn.

## 2020-10-12 ENCOUNTER — Encounter: Payer: Self-pay | Admitting: Internal Medicine

## 2020-10-13 ENCOUNTER — Encounter: Payer: Self-pay | Admitting: Internal Medicine

## 2020-10-13 ENCOUNTER — Telehealth (INDEPENDENT_AMBULATORY_CARE_PROVIDER_SITE_OTHER): Payer: PPO | Admitting: Internal Medicine

## 2020-10-13 ENCOUNTER — Other Ambulatory Visit: Payer: Self-pay

## 2020-10-13 VITALS — Temp 97.6°F | Ht 68.0 in | Wt 142.0 lb

## 2020-10-13 DIAGNOSIS — R059 Cough, unspecified: Secondary | ICD-10-CM

## 2020-10-13 MED ORDER — ALBUTEROL SULFATE HFA 108 (90 BASE) MCG/ACT IN AERS: 2.0000 | INHALATION_SPRAY | Freq: Four times a day (QID) | RESPIRATORY_TRACT | 0 refills | Status: DC | PRN

## 2020-10-13 MED ORDER — LORAZEPAM 0.5 MG PO TABS
ORAL_TABLET | ORAL | 0 refills | Status: DC
Start: 2020-10-13 — End: 2021-02-07

## 2020-10-13 MED ORDER — PREDNISONE 10 MG PO TABS: ORAL_TABLET | ORAL | 0 refills | Status: DC

## 2020-10-13 NOTE — Telephone Encounter (Signed)
ok 

## 2020-10-13 NOTE — Telephone Encounter (Signed)
See other note

## 2020-10-13 NOTE — Progress Notes (Signed)
[name], female   DOB: June 16, 1951, 69 y.o.   MRN: [id number]   Virtual Visit via video Note  This visit type was conducted due to national recommendations for restrictions regarding the COVID-19 pandemic (e.g. social distancing).  This format is felt to be most appropriate for this patient at this time.  All issues noted in this document were discussed and addressed.  No physical exam was performed (except for noted visual exam findings with Video Visits).   I connected with Cindy Arnold by a video enabled telemedicine application and verified that I am speaking with the correct person using two identifiers. Location patient: home Location provider: work  Persons participating in the virtual visit: patient, provider  The limitations, risks, security and privacy concerns of performing an evaluation and management service by video and the availability of in person appointments have been discussed,  It has also been discussed with the patient that there may be a patient responsible charge related to this service. The patient expressed understanding and agreed to proceed.   Reason for visit: work in appt  HPI: States that starting 1011/21 - developed sore throat.  Improved.  No fever.  Some cough.  Had in home covid test - negative.  Was better and then at the start of this week (several days ago), symptoms worsened.  Developed sore throat.  Head felt heavy.  Head hurt - tight around her sinuses.  Increased cough.  Felt - irritated airway. Some wheezing into her chest.  Used an old inhaler.  Helped some.  Took mucinex last night and this am.  Flushing nose.  Slightly nausea previously.  Eating.  Minimal diarrhea.  Increased cough.  Clear nasal mucus.  Cough mostly non productive, but after using inhaler - somewhat productive.  No fever.  Pulse ox 98%.   ROS: See pertinent positives and negatives per HPI.  Past Medical History:  Diagnosis Date  . Basal cell carcinoma    multiple  removed/[facility]  . Breast cancer ([place]) 12/13   s/p right lumpectomy, s/p chemo and XRT  . DES exposure in utero   . Hepatitis A   . History of chicken pox   . Hyperactive airway disease   . 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 Migraines   . Miscarriage    x3    Past Surgical History:  Procedure Laterality Date  . BASAL CELL CARCINOMA EXCISION     Multiple  . BREAST BIOPSY  1975   Cyst  . BREAST LUMPECTOMY WITH AXILLARY LYMPH NODE DISSECTION  01/06/13, 01/22/13   Dr. Drue Dun, [facility], second surgery for pos margin  . CERVICAL FUSION  2001  . DILATION AND CURETTAGE OF UTERUS     x 3  . VAGINAL DELIVERY     x4    Family History  Problem Relation Age of Onset  . Pancreatic cancer Mother   . Cancer Mother        Pancreatic  . Arthritis Mother   . Hyperlipidemia Mother   . Hypertension Mother   . Heart attack Father   . Hyperlipidemia Father   . Hypertension Father   . Heart disease Father 47  . Colon cancer Other   . Cancer Other 50       Breast and Ovarian- dx 50's/Colon  . Birth defects Maternal Aunt        Breast - in 30's  . Stroke Maternal Grandmother   . Birth defects Maternal Aunt  Breast - in 55's    SOCIAL HX: reviewed.    Current Outpatient Medications:  .  BIOTIN PO, Take 10,000 mg by mouth daily. , Disp: , Rfl:  .  butalbital-aspirin-caffeine (FIORINAL) 50-325-40 MG capsule, TAKE 1 CAPSULE BY MOUTH TWICE DAILY AS NEEDED FOR HEADACHE, Disp: 30 capsule, Rfl: 0 .  Cholecalciferol (VITAMIN D3) 125 MCG (5000 UT) CAPS, , Disp: , Rfl:  .  Glucosamine-Chondroitin (MOVE FREE PO), Take 1 capsule by mouth daily. Cartilage/boron/hyalauronic acid, Disp: , Rfl:  .  hydrOXYzine (ATARAX/VISTARIL) 10 MG tablet, TAKE ONE TABLET BY MOUTH EVERY DAY AS NEEDED FOR ITCHING, Disp: 30 tablet, Rfl: 1 .  ipratropium (ATROVENT) 0.06 % nasal spray, SPRAY TWICE INTO EACH NOSTRIL TWICE DAILY, Disp: 30 mL, Rfl: 2 .  LORazepam (ATIVAN) 0.5 MG tablet, TAKE ONE TABLET  BY MOUTH EVERY DAY AS NEEDED FOR ANXIETY, Disp: 30 tablet, Rfl: 0 .  MAGNESIUM BISGLYCINATE PO, Take 200 mg by mouth daily., Disp: , Rfl:  .  Multiple Minerals-Vitamins (CALCIUM CITRATE PLUS/MAGNESIUM PO), Take 2 tablets by mouth 2 (two) times daily. CAL CITRATE 500-MAGNESIUM 80-ZINC-10, Disp: , Rfl:  .  Multiple Vitamins-Minerals (MULTIVITAMIN WOMENS 50+ ADV PO), Take 1 tablet by mouth daily., Disp: , Rfl:  .  Omega-3 Fatty Acids (OMEGA-3 FISH OIL PO), Take 1,280 mg by mouth 2 (two) times daily., Disp: , Rfl:  .  pantoprazole (PROTONIX) 20 MG tablet, TAKE ONE TABLET TWICE DAILY BEFORE A MEAL, Disp: 180 tablet, Rfl: 1 .  propranolol (INDERAL) 40 MG tablet, TAKE 1 TABLET BY MOUTH DAILY, Disp: 90 tablet, Rfl: 1 .  Riboflavin (VITAMIN B2 PO), Take 250 mg by mouth daily., Disp: , Rfl:  .  TURMERIC CURCUMIN PO, Take 2,250 mg by mouth 3 (three) times daily., Disp: , Rfl:  .  zolpidem (AMBIEN) 5 MG tablet, TAKE 1 TABLET AT BEDTIME AS NEEDED, Disp: 30 tablet, Rfl: 0 .  albuterol (VENTOLIN HFA) 108 (90 Base) MCG/ACT inhaler, Inhale 2 puffs into the lungs every 6 (six) hours as needed for wheezing or shortness of breath., Disp: 18 g, Rfl: 0 .  predniSONE (DELTASONE) 10 MG tablet, Take 6 tablets x 1 day and then decrease by 1/2 tablet per day until down to zero mg., Disp: 39 tablet, Rfl: 0  EXAM:  VITALS per patient if applicable: pulse ox 11%  GENERAL: alert, oriented, appears well and in no acute distress  HEENT: atraumatic, conjunttiva clear, no obvious abnormalities on inspection of external nose and ears  NECK: normal movements of the head and neck  LUNGS: on inspection no signs of respiratory distress, breathing rate appears normal, no obvious gross SOB, gasping or wheezing.  Some increased cough with deep breathing.   CV: no obvious cyanosis  PSYCH/NEURO: pleasant and cooperative, no obvious depression or anxiety, speech and thought processing grossly intact  ASSESSMENT AND  PLAN:  Discussed the following assessment and plan:  Problem List Items Addressed This Visit    Cough - Primary    Increased cough, congestion, sinus pressure.  No fever.  No chest pain or increased sob. Some wheezing.  Pulse ox 98%.  Symptoms progressing - moving into her chest.  Previous home covid test - negative.  Given progression of symptoms - will obtain f/u covid test (PCR).  Continue mucinex.  Steroid nasal spray as directed. Continue nasal flushes.  Prednisone taper as directed.  Albuterol inhaler as directed.  Hold abx.  Discussed need for quarantine.  Follow closely.  Call with update.  Relevant Orders   Novel Coronavirus, NAA ([facility]) (Completed)   POCT Influenza A/B (Completed)       I discussed the assessment and treatment plan with the patient. The patient was provided an opportunity to ask questions and all were answered. The patient agreed with the plan and demonstrated an understanding of the instructions.   The patient was advised to call back or seek an in-person evaluation if the symptoms worsen or if the condition fails to improve as anticipated.    Einar Pheasant, MD

## 2020-10-14 ENCOUNTER — Other Ambulatory Visit: Payer: Self-pay

## 2020-10-14 ENCOUNTER — Other Ambulatory Visit (INDEPENDENT_AMBULATORY_CARE_PROVIDER_SITE_OTHER): Payer: PPO

## 2020-10-14 DIAGNOSIS — R059 Cough, unspecified: Secondary | ICD-10-CM

## 2020-10-14 LAB — POCT INFLUENZA A/B
Influenza A, POC: NEGATIVE
Influenza B, POC: NEGATIVE

## 2020-10-16 ENCOUNTER — Encounter: Payer: Self-pay | Admitting: Internal Medicine

## 2020-10-16 LAB — SARS-COV-2, NAA 2 DAY TAT

## 2020-10-16 LAB — NOVEL CORONAVIRUS, NAA: SARS-CoV-2, NAA: NOT DETECTED

## 2020-10-16 NOTE — Assessment & Plan Note (Signed)
Increased cough, congestion, sinus pressure.  No fever.  No chest pain or increased sob. Some wheezing.  Pulse ox 98%.  Symptoms progressing - moving into her chest.  Previous home covid test - negative.  Given progression of symptoms - will obtain f/u covid test (PCR).  Continue mucinex.  Steroid nasal spray as directed. Continue nasal flushes.  Prednisone taper as directed.  Albuterol inhaler as directed.  Hold abx.  Discussed need for quarantine.  Follow closely.  Call with update.

## 2020-10-17 ENCOUNTER — Other Ambulatory Visit: Payer: PPO

## 2020-10-18 ENCOUNTER — Encounter: Payer: Self-pay | Admitting: Internal Medicine

## 2020-10-19 MED ORDER — CEFDINIR 300 MG PO CAPS: 300.0000 mg | ORAL_CAPSULE | Freq: Two times a day (BID) | ORAL | 0 refills | Status: DC

## 2020-10-19 NOTE — Telephone Encounter (Signed)
rx sent in for [facility]

## 2020-11-01 DIAGNOSIS — Z853 Personal history of malignant neoplasm of breast: Secondary | ICD-10-CM | POA: Diagnosis not present

## 2020-11-01 DIAGNOSIS — N83202 Unspecified ovarian cyst, left side: Secondary | ICD-10-CM | POA: Diagnosis not present

## 2020-11-02 ENCOUNTER — Encounter: Payer: Self-pay | Admitting: Internal Medicine

## 2020-11-03 NOTE — Telephone Encounter (Signed)
Ok to reschedule her thyroid.  Can do non fasting lab in one month - to recheck tsh.  Also, I would wait at least 3 months before getting the booster.

## 2020-11-11 DIAGNOSIS — N83202 Unspecified ovarian cyst, left side: Secondary | ICD-10-CM | POA: Diagnosis not present

## 2020-11-15 ENCOUNTER — Other Ambulatory Visit: Payer: Self-pay

## 2020-11-15 DIAGNOSIS — K219 Gastro-esophageal reflux disease without esophagitis: Secondary | ICD-10-CM

## 2020-11-15 NOTE — Telephone Encounter (Signed)
TSH ordered.

## 2020-11-15 NOTE — Telephone Encounter (Signed)
Pt called in to reschedule lab but there are no orders in system. I went ahead an scheduled an appt for 12/05/20 per patient. Will you please put lab order in? Thanks.

## 2020-11-18 ENCOUNTER — Other Ambulatory Visit: Payer: Self-pay

## 2020-11-18 ENCOUNTER — Emergency Department: Payer: PPO

## 2020-11-18 ENCOUNTER — Emergency Department
Admission: EM | Admit: 2020-11-18 | Discharge: 2020-11-18 | Disposition: A | Discharge status: Home / Self Care | Payer: PPO | Attending: Emergency Medicine | Admitting: Emergency Medicine

## 2020-11-18 DIAGNOSIS — E876 Hypokalemia: Secondary | ICD-10-CM | POA: Insufficient documentation

## 2020-11-18 DIAGNOSIS — C50811 Malignant neoplasm of overlapping sites of right female breast: Secondary | ICD-10-CM | POA: Insufficient documentation

## 2020-11-18 DIAGNOSIS — J45909 Unspecified asthma, uncomplicated: Secondary | ICD-10-CM | POA: Diagnosis not present

## 2020-11-18 DIAGNOSIS — R079 Chest pain, unspecified: Secondary | ICD-10-CM | POA: Diagnosis not present

## 2020-11-18 DIAGNOSIS — R002 Palpitations: Secondary | ICD-10-CM | POA: Diagnosis not present

## 2020-11-18 LAB — BASIC METABOLIC PANEL
Anion gap: 10 (ref 5–15)
BUN: 17 mg/dL (ref 8–23)
CO2: 25 mmol/L (ref 22–32)
Calcium: 9 mg/dL (ref 8.9–10.3)
Chloride: 107 mmol/L (ref 98–111)
Creatinine, Ser: 1.07 mg/dL — ABNORMAL HIGH (ref 0.44–1.00)
GFR, Estimated: 57 mL/min — ABNORMAL LOW (ref >60)
Glucose, Bld: 110 mg/dL — ABNORMAL HIGH (ref 70–99)
Potassium: 2.8 mmol/L — ABNORMAL LOW (ref 3.5–5.1)
Sodium: 142 mmol/L (ref 135–145)

## 2020-11-18 LAB — CBC
HCT: 38.5 % (ref 36.0–46.0)
Hemoglobin: 13.3 g/dL (ref 12.0–15.0)
MCH: 32.1 pg (ref 26.0–34.0)
MCHC: 34.5 g/dL (ref 30.0–36.0)
MCV: 93 fL (ref 80.0–100.0)
Platelets: 269 10*3/uL (ref 150–400)
RBC: 4.14 MIL/uL (ref 3.87–5.11)
RDW: 12.1 % (ref 11.5–15.5)
WBC: 5.5 10*3/uL (ref 4.0–10.5)
nRBC: 0 % (ref 0.0–0.2)

## 2020-11-18 LAB — TROPONIN I (HIGH SENSITIVITY)
Troponin I (High Sensitivity): 4 ng/L (ref <18)
Troponin I (High Sensitivity): 7 ng/L (ref <18)

## 2020-11-18 MED ORDER — POTASSIUM CHLORIDE ER 20 MEQ PO TBCR: 10.0000 meq | EXTENDED_RELEASE_TABLET | Freq: Two times a day (BID) | ORAL | 0 refills | Status: DC

## 2020-11-18 MED ORDER — POTASSIUM CHLORIDE 10 MEQ/100ML IV SOLN
10.0000 meq | Freq: Once | INTRAVENOUS | Status: AC
  Administered 2020-11-18: 10 meq via INTRAVENOUS
  Filled 2020-11-18: qty 100

## 2020-11-18 NOTE — Discharge Instructions (Signed)
Return to the ER for new, worsening, or persistent severe palpitations, weakness or lightheadedness, shortness of breath, chest pain, or any other new or worsening symptoms that concern you.

## 2020-11-18 NOTE — ED Triage Notes (Addendum)
Pt to ED POV for chief complaint of "palpitations/racing heart" with last hour States this has happened before, PCP did EKG with no results. Does not happen very often.  States chest feels "weird" and likes it's fluttering.  Pt alert and oriented, clear speech, NAD noted.   Pt hypertensive 160s in triage, denies hx of HTN

## 2020-11-18 NOTE — ED Provider Notes (Signed)
[facility] Emergency Department Provider Note ____________________________________________   First MD Initiated Contact with Patient 11/18/20 2203     (approximate)  I have reviewed the triage vital signs and the nursing notes.   HISTORY  Chief Complaint palpitations    HPI Cindy Arnold is a 69 y.o. female with PMH as noted below who presents with palpitations lasting for about an hour prior to coming to the ED, now resolved, and described as a sensation of her heart racing.  She states that she felt slightly shaky overall, but denies lightheadedness or near syncope.  She had no associated shortness of breath or chest pain.  She states that she has had episodes like this before, but not lasting as long.  She has seen her cardiologist about it but her EKGs were normal.  The patient states that she generally feels back to her baseline now.  She reports increased stress and anxiety yesterday related to having numerous guests over for Thanksgiving.  She does drink coffee but denies any change in her caffeine intake.  Past Medical History:  Diagnosis Date  . Basal cell carcinoma    multiple removed/[facility]  . Breast cancer ([place]) 12/13   s/p right lumpectomy, s/p chemo and XRT  . DES exposure in utero   . Hepatitis A   . History of chicken pox   . Hyperactive airway disease   . 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 Migraines   . Miscarriage    x3    Patient Active Problem List   Diagnosis Date Noted  . Urinary urgency 01/10/2020  . Left hip pain 10/04/2018  . GERD (gastroesophageal reflux disease) 08/10/2018  . Numbness of right hand 08/10/2018  . Neck pain 02/04/2018  . Cough 11/14/2015  . Ovarian cyst 04/20/2015  . Palpitations 01/18/2015  . Anxiety state 01/18/2015  . Healthcare maintenance 11/05/2013  . Osteopenia 10/06/2013  . History of breast cancer 12/03/2012  . Screening for breast cancer 09/24/2012  . Migraines 02/14/2012    . Asthma 02/14/2012  . Insomnia 02/14/2012    Past Surgical History:  Procedure Laterality Date  . BASAL CELL CARCINOMA EXCISION     Multiple  . BREAST BIOPSY  1975   Cyst  . BREAST LUMPECTOMY WITH AXILLARY LYMPH NODE DISSECTION  01/06/13, 01/22/13   Dr. Drue Dun, [facility], second surgery for pos margin  . CERVICAL FUSION  2001  . DILATION AND CURETTAGE OF UTERUS     x 3  . VAGINAL DELIVERY     x4    Prior to Admission medications   Medication Sig Start Date End Date Taking? Authorizing Provider  albuterol (VENTOLIN HFA) 108 (90 Base) MCG/ACT inhaler Inhale 2 puffs into the lungs every 6 (six) hours as needed for wheezing or shortness of breath. 10/13/20   Einar Pheasant, MD  BIOTIN PO Take 10,000 mg by mouth daily.     [provider]  butalbital-aspirin-caffeine [facility]) 50-325-40 MG capsule TAKE 1 CAPSULE BY MOUTH TWICE DAILY AS NEEDED FOR HEADACHE 07/07/20   McLean-Scocuzza, Nino Glow, MD  cefdinir (OMNICEF) 300 MG capsule Take 1 capsule (300 mg total) by mouth 2 (two) times daily. 10/19/20   Einar Pheasant, MD  Cholecalciferol (VITAMIN D3) 125 MCG (5000 UT) CAPS  12/24/17   [provider]  Glucosamine-Chondroitin (MOVE FREE PO) Take 1 capsule by mouth daily. Cartilage/boron/hyalauronic acid    [provider]  hydrOXYzine (ATARAX/VISTARIL) 10 MG tablet TAKE ONE TABLET BY MOUTH EVERY  DAY AS NEEDED FOR ITCHING 09/20/20   Einar Pheasant, MD  ipratropium (ATROVENT) 0.06 % nasal spray SPRAY TWICE INTO EACH NOSTRIL TWICE DAILY 07/18/20   Einar Pheasant, MD  LORazepam (ATIVAN) 0.5 MG tablet TAKE ONE TABLET BY MOUTH EVERY DAY AS NEEDED FOR ANXIETY 10/13/20   Einar Pheasant, MD  MAGNESIUM BISGLYCINATE PO Take 200 mg by mouth daily.    [provider]  Multiple Minerals-Vitamins (CALCIUM CITRATE PLUS/MAGNESIUM PO) Take 2 tablets by mouth 2 (two) times daily. CAL CITRATE 500-MAGNESIUM 80-ZINC-10    [provider]  Multiple Vitamins-Minerals  (MULTIVITAMIN WOMENS 50+ ADV PO) Take 1 tablet by mouth daily.    [provider]  Omega-3 Fatty Acids (OMEGA-3 FISH OIL PO) Take 1,280 mg by mouth 2 (two) times daily.    [provider]  pantoprazole (PROTONIX) 20 MG tablet TAKE ONE TABLET TWICE DAILY BEFORE A MEAL 07/03/19   Einar Pheasant, MD  potassium chloride 20 MEQ TBCR Take 10 mEq by mouth 2 (two) times daily for 7 days. 11/18/20 11/25/20  Arta Silence, MD  predniSONE (DELTASONE) 10 MG tablet Take 6 tablets x 1 day and then decrease by 1/2 tablet per day until down to zero mg. 10/13/20   Einar Pheasant, MD  propranolol (INDERAL) 40 MG tablet TAKE 1 TABLET BY MOUTH DAILY 06/28/20   Einar Pheasant, MD  Riboflavin (VITAMIN B2 PO) Take 250 mg by mouth daily.    [provider]  TURMERIC CURCUMIN PO Take 2,250 mg by mouth 3 (three) times daily.    [provider]  zolpidem (AMBIEN) 5 MG tablet TAKE 1 TABLET AT BEDTIME AS NEEDED 07/07/20   McLean-Scocuzza, Nino Glow, MD    Allergies Patient has no known allergies.  Family History  Problem Relation Age of Onset  . Pancreatic cancer Mother   . Cancer Mother        Pancreatic  . Arthritis Mother   . Hyperlipidemia Mother   . Hypertension Mother   . Heart attack Father   . Hyperlipidemia Father   . Hypertension Father   . Heart disease Father 21  . Colon cancer Other   . Cancer Other 50       Breast and Ovarian- dx 50's/Colon  . Birth defects Maternal Aunt        Breast - in 52's  . Stroke Maternal Grandmother   . Birth defects Maternal Aunt        Breast - in 68's    Social History Social History   Tobacco Use  . Smoking status: Never Smoker  . Smokeless tobacco: Never Used  Substance Use Topics  . Alcohol use: Yes    Alcohol/week: 0.0 standard drinks    Comment: 3 times a week  . Drug use: No    Review of Systems  Constitutional: No fever. Eyes: No redness. ENT: No sore throat. Cardiovascular: Denies chest pain.  Positive  for resolved palpitations. Respiratory: Denies shortness of breath. Gastrointestinal: No vomiting or diarrhea.  Genitourinary: Negative for dysuria.  Musculoskeletal: Negative for back pain. Skin: Negative for rash. Neurological: Negative for headache.   ____________________________________________   PHYSICAL EXAM:  VITAL SIGNS: ED Triage Vitals  Enc Vitals Group     BP 11/18/20 2023 (!) 167/92     Pulse Rate 11/18/20 2023 (!) 102     Resp 11/18/20 2023 20     Temp 11/18/20 2023 97.6 F (36.4 C)     Temp Source 11/18/20 2023 Oral  SpO2 11/18/20 2023 99 %     Weight 11/18/20 2024 145 lb (65.8 kg)     Height 11/18/20 2024 5\' 8"  (1.727 m)     Head Circumference --      Peak Flow --      Pain Score 11/18/20 2024 0     Pain Loc --      Pain Edu? --      Excl. in [place]? --     Constitutional: Alert and oriented. Well appearing and in no acute distress. Eyes: Conjunctivae are normal.  Head: Atraumatic. Nose: No congestion/rhinnorhea. Mouth/Throat: Mucous membranes are moist.   Neck: Normal range of motion.  Cardiovascular: Normal rate, regular rhythm. Grossly normal heart sounds.  Good peripheral circulation. Respiratory: Normal respiratory effort.  No retractions. Lungs CTAB. Gastrointestinal: No distention.  Musculoskeletal: No lower extremity edema.  Extremities warm and well perfused.  Neurologic:  Normal speech and language. No gross focal neurologic deficits are appreciated.  Skin:  Skin is warm and dry. No rash noted. Psychiatric: Mood and affect are normal. Speech and behavior are normal.  ____________________________________________   LABS (all labs ordered are listed, but only abnormal results are displayed)  Labs Reviewed  BASIC METABOLIC PANEL - Abnormal; Notable for the following components:      Result Value   Potassium 2.8 (*)    Glucose, Bld 110 (*)    Creatinine, Ser 1.07 (*)    GFR, Estimated 57 (*)    All other components within normal limits    CBC  TROPONIN I (HIGH SENSITIVITY)  TROPONIN I (HIGH SENSITIVITY)   ____________________________________________  EKG  ED ECG REPORT I, Arta Silence, the attending physician, personally viewed and interpreted this ECG.  Date: 11/18/2020 EKG Time: 2019 Rate: 107 Rhythm: normal sinus rhythm with PACs QRS Axis: normal Intervals: normal ST/T Wave abnormalities: normal Narrative Interpretation: no evidence of acute ischemia  ____________________________________________  RADIOLOGY  CXR interpreted by me shows no focal infiltrate or edema  ____________________________________________   PROCEDURES  Procedure(s) performed: No  Procedures  Critical Care performed: No ____________________________________________   INITIAL IMPRESSION / ASSESSMENT AND PLAN / ED COURSE  Pertinent labs & imaging results that were available during my care of the patient were reviewed by me and considered in my medical decision making (see chart for details).  69 year old female with PMH as noted above but no prior cardiac history presents with an episode of palpitations lasting for about 1.5 hours and now resolved.  She had no significant associated symptoms.  On exam, the patient is overall well-appearing.  Her vital signs are normal.  Lungs are clear.  There is no peripheral edema.  The physical exam is otherwise unremarkable.  EKG on arrival showed sinus tachycardia with some PACs.  There were no other acute acute abnormalities.  Initial lab work-up obtained from triage is significant for hypokalemia, which the patient has a history of, but is otherwise negative.  Initial troponin is negative and chest x-ray is clear.  Overall I suspect that the patient was having palpitations related to the PACs.  This could be due to anxiety, dehydration, the hypokalemia, or less likely primary cardiac etiology.  Since the symptoms are resolved and her vital signs are stable I anticipate discharge  home.  We will obtain a repeat troponin, replete the potassium, and reassess.  ----------------------------------------- 11:39 PM on 11/18/2020 -----------------------------------------  The patient has continued to have no palpitations in the ED.  Her repeat troponin is negative.  She has been  given a dose of IV potassium and I will prescribe a 1 week course of oral potassium for home.  She feels well and is stable for discharge.  I counseled her on the results of the work-up.  Return precautions given, and she expresses understanding.  ____________________________________________   FINAL CLINICAL IMPRESSION(S) / ED DIAGNOSES  Final diagnoses:  Palpitations  Hypokalemia      NEW MEDICATIONS STARTED DURING THIS VISIT:  New Prescriptions   POTASSIUM CHLORIDE 20 MEQ TBCR    Take 10 mEq by mouth 2 (two) times daily for 7 days.     Note:  This document was prepared using Dragon voice recognition software and may include unintentional dictation errors.    Arta Silence, MD 11/18/20 2340

## 2020-11-20 ENCOUNTER — Encounter: Payer: Self-pay | Admitting: Internal Medicine

## 2020-11-21 NOTE — Telephone Encounter (Signed)
I can see her at 12:00 on Wednesday 11/23/20 or Thursday 11/24/20.  Will do labs at appt.

## 2020-11-21 NOTE — Telephone Encounter (Signed)
Please review and let me know when she needs to do follow up labs and a visit with you.

## 2020-11-22 ENCOUNTER — Ambulatory Visit (INDEPENDENT_AMBULATORY_CARE_PROVIDER_SITE_OTHER): Payer: PPO | Admitting: Internal Medicine

## 2020-11-22 ENCOUNTER — Other Ambulatory Visit: Payer: Self-pay | Admitting: Internal Medicine

## 2020-11-22 ENCOUNTER — Other Ambulatory Visit: Payer: Self-pay

## 2020-11-22 DIAGNOSIS — R7989 Other specified abnormal findings of blood chemistry: Secondary | ICD-10-CM

## 2020-11-22 DIAGNOSIS — R002 Palpitations: Secondary | ICD-10-CM | POA: Diagnosis not present

## 2020-11-22 DIAGNOSIS — J4520 Mild intermittent asthma, uncomplicated: Secondary | ICD-10-CM

## 2020-11-22 DIAGNOSIS — E876 Hypokalemia: Secondary | ICD-10-CM

## 2020-11-22 DIAGNOSIS — K219 Gastro-esophageal reflux disease without esophagitis: Secondary | ICD-10-CM | POA: Diagnosis not present

## 2020-11-22 DIAGNOSIS — N83202 Unspecified ovarian cyst, left side: Secondary | ICD-10-CM | POA: Diagnosis not present

## 2020-11-22 LAB — BASIC METABOLIC PANEL
BUN: 14 mg/dL (ref 6–23)
CO2: 27 mEq/L (ref 19–32)
Calcium: 9.6 mg/dL (ref 8.4–10.5)
Chloride: 105 mEq/L (ref 96–112)
Creatinine, Ser: 0.84 mg/dL (ref 0.40–1.20)
GFR: 71.35 mL/min (ref >60.00)
Glucose, Bld: 97 mg/dL (ref 70–99)
Potassium: 4 mEq/L (ref 3.5–5.1)
Sodium: 140 mEq/L (ref 135–145)

## 2020-11-22 NOTE — Progress Notes (Signed)
[name], female   DOB: [birth year], 69 y.o.   MRN: [id number]   Subjective:    [name], female    DOB: Aug 27, 1951, 69 y.o.   MRN: [id number]  HPI This visit occurred during the SARS-CoV-2 public health emergency.  Safety protocols were in place, including screening questions prior to the visit, additional usage of staff PPE, and extensive cleaning of exam room while observing appropriate contact time as indicated for disinfecting solutions.  Patient here for ER follow up. Evaluated in ER 11/18/20 for increased heart rate and palpitations. Blood pressure also elevated.  No chest pain or sob.  Persisted, so went to ER. Potassium low.   CXR ok. EKG - PACs.  Potassium given in ER and sent home with oral supplements.  Since discharge from ER, has felt better.  Feels back to baseline.  No chest pain or sob reported.  No increased heart rate or palpitations.  No abdominal pain.  Blood pressure ok.    Past Medical History:  Diagnosis Date  . Basal cell carcinoma    multiple removed/[facility]  . Breast cancer ([place]) 12/13   s/p right lumpectomy, s/p chemo and XRT  . DES exposure in utero   . Hepatitis A   . History of chicken pox   . Hyperactive airway disease   . 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 Migraines   . Miscarriage    x3   Past Surgical History:  Procedure Laterality Date  . BASAL CELL CARCINOMA EXCISION     Multiple  . BREAST BIOPSY  1975   Cyst  . BREAST LUMPECTOMY WITH AXILLARY LYMPH NODE DISSECTION  01/06/13, 01/22/13   Dr. Drue Dun, [facility], second surgery for pos margin  . CERVICAL FUSION  2001  . DILATION AND CURETTAGE OF UTERUS     x 3  . VAGINAL DELIVERY     x4   Family History  Problem Relation Age of Onset  . Pancreatic cancer Mother   . Cancer Mother        Pancreatic  . Arthritis Mother   . Hyperlipidemia Mother   . Hypertension Mother   . Heart attack Father   . Hyperlipidemia Father   . Hypertension Father   .  Heart disease Father 2  . Colon cancer Other   . Cancer Other 50       Breast and Ovarian- dx 50's/Colon  . Birth defects Maternal Aunt        Breast - in 81's  . Stroke Maternal Grandmother   . Birth defects Maternal Aunt        Breast - in 42's   Social History   Socioeconomic History  . Marital status: Married    Spouse name: Not on file  . Number of children: 4  . Years of education: Not on file  . Highest education level: Not on file  Occupational History  . Occupation: Corporate treasurer - [employer]  Tobacco Use  . Smoking status: Never Smoker  . Smokeless tobacco: Never Used  Substance and Sexual Activity  . Alcohol use: Yes    Alcohol/week: 0.0 standard drinks    Comment: 3 times a week  . Drug use: No  . Sexual activity: Not on file  Other Topics Concern  . Not on file  Social History Narrative   Lives in [place] with husband.  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    Social Determinants of Radio broadcast assistant  Strain: Not on file  Food Insecurity: Not on file  Transportation Needs: Not on file  Physical Activity: Not on file  Stress: Not on file  Social Connections: Not on file    Outpatient Encounter Medications as of 11/22/2020  Medication Sig  . albuterol (VENTOLIN HFA) 108 (90 Base) MCG/ACT inhaler Inhale 2 puffs into the lungs every 6 (six) hours as needed for wheezing or shortness of breath.  Marland Kitchen BIOTIN PO Take 10,000 mg by mouth daily.   . butalbital-aspirin-caffeine (FIORINAL) 50-325-40 MG capsule TAKE 1 CAPSULE BY MOUTH TWICE DAILY AS NEEDED FOR HEADACHE  . Cholecalciferol (VITAMIN D3) 125 MCG (5000 UT) CAPS   . Glucosamine-Chondroitin (MOVE FREE PO) Take 1 capsule by mouth daily. Cartilage/boron/hyalauronic acid  . hydrOXYzine (ATARAX/VISTARIL) 10 MG tablet TAKE ONE TABLET BY MOUTH EVERY DAY AS NEEDED FOR ITCHING  . ipratropium (ATROVENT) 0.06 % nasal spray SPRAY TWICE INTO EACH NOSTRIL TWICE DAILY  . LORazepam (ATIVAN) 0.5 MG tablet TAKE ONE  TABLET BY MOUTH EVERY DAY AS NEEDED FOR ANXIETY  . MAGNESIUM BISGLYCINATE PO Take 200 mg by mouth daily.  . Multiple Minerals-Vitamins (CALCIUM CITRATE PLUS/MAGNESIUM PO) Take 2 tablets by mouth 2 (two) times daily. CAL CITRATE 500-MAGNESIUM 80-ZINC-10  . Multiple Vitamins-Minerals (MULTIVITAMIN WOMENS 50+ ADV PO) Take 1 tablet by mouth daily.  . Omega-3 Fatty Acids (OMEGA-3 FISH OIL PO) Take 1,280 mg by mouth 2 (two) times daily.  . pantoprazole (PROTONIX) 20 MG tablet TAKE ONE TABLET TWICE DAILY BEFORE A MEAL  . propranolol (INDERAL) 40 MG tablet TAKE 1 TABLET BY MOUTH DAILY  . Riboflavin (VITAMIN B2 PO) Take 250 mg by mouth daily.  . TURMERIC CURCUMIN PO Take 2,250 mg by mouth 3 (three) times daily.  Marland Kitchen zolpidem (AMBIEN) 5 MG tablet TAKE 1 TABLET AT BEDTIME AS NEEDED  . [DISCONTINUED] cefdinir (OMNICEF) 300 MG capsule Take 1 capsule (300 mg total) by mouth 2 (two) times daily.  . [DISCONTINUED] potassium chloride 20 MEQ TBCR Take 10 mEq by mouth 2 (two) times daily for 7 days.  . [DISCONTINUED] predniSONE (DELTASONE) 10 MG tablet Take 6 tablets x 1 day and then decrease by 1/2 tablet per day until down to zero mg.   No facility-administered encounter medications on file as of 11/22/2020.    Review of Systems  Constitutional: Negative for appetite change and unexpected weight change.  HENT: Negative for congestion and sinus pressure.   Respiratory: Negative for cough, chest tightness and shortness of breath.   Cardiovascular: Negative for chest pain, palpitations and leg swelling.  Gastrointestinal: Negative for abdominal pain, diarrhea, nausea and vomiting.  Genitourinary: Negative for difficulty urinating and dysuria.  Musculoskeletal: Negative for joint swelling and myalgias.  Skin: Negative for color change and rash.  Neurological: Negative for dizziness, light-headedness and headaches.  Psychiatric/Behavioral: Negative for agitation. The patient is not nervous/anxious.         Objective:    Physical Exam Vitals reviewed.  Constitutional:      General: She is not in acute distress.    Appearance: Normal appearance.  HENT:     Head: Normocephalic and atraumatic.     Nose: Nose normal.     Mouth/Throat:     Mouth: Oropharynx is clear and moist.  Eyes:     General: No scleral icterus.       Right eye: No discharge.        Left eye: No discharge.     Conjunctiva/sclera: Conjunctivae normal.  Neck:  Thyroid: No thyromegaly.  Cardiovascular:     Rate and Rhythm: Normal rate and regular rhythm.  Pulmonary:     Effort: No respiratory distress.     Breath sounds: Normal breath sounds. No wheezing.  Abdominal:     General: Bowel sounds are normal.     Palpations: Abdomen is soft.     Tenderness: There is no abdominal tenderness.  Musculoskeletal:        General: No swelling, tenderness or edema.     Cervical back: Neck supple.  Lymphadenopathy:     Cervical: No cervical adenopathy.  Neurological:     Mental Status: She is alert.  Psychiatric:        Mood and Affect: Mood normal.        Behavior: Behavior normal.     BP 118/68   Pulse 64   Temp 98.2 F (36.8 C) (Oral)   Resp 16   Wt 147 lb 3.2 oz (66.8 kg)   SpO2 98%   BMI 22.38 kg/m  Wt Readings from Last 3 Encounters:  11/22/20 147 lb 3.2 oz (66.8 kg)  11/18/20 145 lb (65.8 kg)  10/13/20 142 lb (64.4 kg)     Lab Results  Component Value Date   WBC 5.5 11/18/2020   HGB 13.3 11/18/2020   HCT 38.5 11/18/2020   PLT 269 11/18/2020   GLUCOSE 97 11/22/2020   CHOL 229 (H) 09/13/2020   TRIG 78.0 09/13/2020   HDL 76.30 09/13/2020   LDLCALC 138 (H) 09/13/2020   ALT 19 09/13/2020   AST 21 09/13/2020   NA 140 11/22/2020   K 3.9 11/30/2020   CL 105 11/22/2020   CREATININE 0.84 11/22/2020   BUN 14 11/22/2020   CO2 27 11/22/2020   TSH 3.28 11/30/2020   MICROALBUR <0.7 11/14/2015    DG Chest 2 View  Result Date: 11/18/2020 CLINICAL DATA:  Chest pain palpitation EXAM: CHEST - 2  VIEW COMPARISON:  11/16/2015 FINDINGS: Clips over the right breast. No focal opacity or pleural effusion. Normal cardiomediastinal silhouette. No pneumothorax. IMPRESSION: No active cardiopulmonary disease. Electronically Signed   By: Donavan Foil M.D.   On: 11/18/2020 20:59       Assessment & Plan:   Problem List Items Addressed This Visit    Palpitations    Increased heart rate and palpitations as outlined.  Found to have low potassium.  Replaced.  Feeling better.  No increased heart rate or palpitations.        Ovarian cyst    Being followed by gyn.  Planning for removal.        Hypokalemia    Replaced.  Recheck potassium today.  Follow.        Relevant Orders   Basic metabolic panel (Completed)   GERD (gastroesophageal reflux disease)    No upper symptoms reported. On protonix.       Asthma    Breathing stable.           Einar Pheasant, MD

## 2020-11-22 NOTE — Progress Notes (Signed)
Order placed for f/u labs.  

## 2020-11-23 ENCOUNTER — Other Ambulatory Visit: Payer: Self-pay

## 2020-11-23 ENCOUNTER — Telehealth: Payer: Self-pay

## 2020-11-23 MED ORDER — POTASSIUM CHLORIDE ER 20 MEQ PO TBCR: 20.0000 meq | EXTENDED_RELEASE_TABLET | Freq: Every day | ORAL | 0 refills | Status: DC

## 2020-11-23 NOTE — Telephone Encounter (Signed)
LMTCB for lab results.  

## 2020-11-23 NOTE — Telephone Encounter (Signed)
Pt returned your call. I went ahead and moved her lab appointment to next week but she still wants a call for her results.

## 2020-11-23 NOTE — Telephone Encounter (Signed)
Pthas been called & results reviewed. Questions sent to Dr. Nicki Reaper.

## 2020-11-30 ENCOUNTER — Other Ambulatory Visit: Payer: Self-pay

## 2020-11-30 ENCOUNTER — Other Ambulatory Visit: Payer: Self-pay | Admitting: Obstetrics & Gynecology

## 2020-11-30 ENCOUNTER — Other Ambulatory Visit (INDEPENDENT_AMBULATORY_CARE_PROVIDER_SITE_OTHER): Payer: PPO

## 2020-11-30 DIAGNOSIS — E876 Hypokalemia: Secondary | ICD-10-CM

## 2020-11-30 DIAGNOSIS — R7989 Other specified abnormal findings of blood chemistry: Secondary | ICD-10-CM

## 2020-11-30 LAB — T4, FREE: Free T4: 1.15 ng/dL (ref 0.60–1.60)

## 2020-11-30 LAB — POTASSIUM: Potassium: 3.9 mEq/L (ref 3.5–5.1)

## 2020-11-30 LAB — TSH: TSH: 3.28 u[IU]/mL (ref 0.35–4.50)

## 2020-12-01 ENCOUNTER — Other Ambulatory Visit: Payer: Self-pay | Admitting: Internal Medicine

## 2020-12-01 DIAGNOSIS — E876 Hypokalemia: Secondary | ICD-10-CM

## 2020-12-01 NOTE — Progress Notes (Signed)
Order placed for f/u potassium.  

## 2020-12-04 ENCOUNTER — Encounter: Payer: Self-pay | Admitting: Internal Medicine

## 2020-12-04 NOTE — Assessment & Plan Note (Signed)
Increased heart rate and palpitations as outlined.  Found to have low potassium.  Replaced.  Feeling better.  No increased heart rate or palpitations.

## 2020-12-04 NOTE — Assessment & Plan Note (Signed)
No upper symptoms reported.  On protonix.   

## 2020-12-04 NOTE — Assessment & Plan Note (Signed)
Breathing stable.

## 2020-12-04 NOTE — Assessment & Plan Note (Signed)
Being followed by gyn.  Planning for removal.

## 2020-12-04 NOTE — Assessment & Plan Note (Signed)
Replaced.  Recheck potassium today.  Follow.

## 2020-12-05 ENCOUNTER — Other Ambulatory Visit: Payer: PPO

## 2020-12-06 ENCOUNTER — Other Ambulatory Visit: Payer: Self-pay | Admitting: Internal Medicine

## 2020-12-11 ENCOUNTER — Encounter: Payer: Self-pay | Admitting: Internal Medicine

## 2020-12-12 ENCOUNTER — Other Ambulatory Visit (INDEPENDENT_AMBULATORY_CARE_PROVIDER_SITE_OTHER): Payer: PPO

## 2020-12-12 ENCOUNTER — Other Ambulatory Visit: Payer: Self-pay

## 2020-12-12 DIAGNOSIS — E876 Hypokalemia: Secondary | ICD-10-CM

## 2020-12-12 LAB — POTASSIUM: Potassium: 4 mEq/L (ref 3.5–5.1)

## 2020-12-13 ENCOUNTER — Other Ambulatory Visit: Payer: Self-pay | Admitting: Internal Medicine

## 2020-12-13 DIAGNOSIS — E876 Hypokalemia: Secondary | ICD-10-CM

## 2020-12-13 NOTE — Progress Notes (Signed)
Order placed for f/u lab.   

## 2020-12-14 ENCOUNTER — Other Ambulatory Visit
Admission: RE | Admit: 2020-12-14 | Discharge: 2020-12-14 | Disposition: A | Discharge status: Home / Self Care | Payer: PPO | Source: Ambulatory Visit | Attending: Obstetrics & Gynecology | Admitting: Obstetrics & Gynecology

## 2020-12-14 NOTE — Telephone Encounter (Signed)
See me before calling pt.  Please call pt.  She has had questions about the booster.  See notes.  If she is having scratchy throat and cough and is concerned about resolving infection, can let this clear and then get booster.  If just chronic issues (allergies) - can get booster. Confirm no sob, etc. She is being covid tested next week.  Can confirm with gyn if ok to get prior to surgery.

## 2020-12-14 NOTE — Patient Instructions (Addendum)
Your procedure is scheduled on: 12/21/20- Wednesday Report to the Registration Desk on the 1st floor of the [facility]. To find out your arrival time, please call [phone] between 1PM - 3PM on: 12/20/20- Tuesday  REMEMBER: Instructions that are not followed completely may result in serious medical risk, up to and including death; or upon the discretion of your surgeon and anesthesiologist your surgery may need to be rescheduled.  Do not eat food after midnight the night before surgery.  No gum chewing, lozengers or hard candies.  You may however, drink CLEAR liquids up to 2 hours before you are scheduled to arrive for your surgery. Do not drink anything within 2 hours of your scheduled arrival time.  Clear liquids include: - water  - apple juice without pulp - gatorade (not RED, PURPLE, OR BLUE) - black coffee or tea (Do NOT add milk or creamers to the coffee or tea) Do NOT drink anything that is not on this list.  Type 1 and Type 2 diabetics should only drink water.  TAKE THESE MEDICATIONS THE MORNING OF SURGERY WITH A SIP OF WATER: - ipratropium (ATROVENT) 0.06 % nasal spray - pantoprazole (PROTONIX) 20 MG tablet, (take one the night before and one on the morning of surgery - helps to prevent nausea after         Surgery.) - propranolol (INDERAL) 40 MG tablet Use inhalers albuterol (VENTOLIN HFA) 108 (90 Base) MCG/ACT inhaler on the day of surgery and bring to the hospital.  One week prior to surgery: [facility] Stop Anti-inflammatories (NSAIDS) such as Advil, Aleve, Ibuprofen, Motrin, Naproxen, Naprosyn and Aspirin based products such as Excedrin, Goodys Powder, BC Powder.  Stop ANY OVER THE COUNTER supplements until after surgery.Glucosamine-Chondroitin (MOVE FREE PO), MAGNESIUM BISGLYCINATE PO, - Omega-3 Fatty Acids (OMEGA-3 FISH OIL PO) , TURMERIC CURCUMIN PO, BIOTIN PO (However, you may continue taking Vitamin D, Vitamin B, and multivitamin up until the day before  surgery.)  No Alcohol for 24 hours before or after surgery.  No Smoking including e-cigarettes for 24 hours prior to surgery.  No chewable tobacco products for at least 6 hours prior to surgery.  No nicotine patches on the day of surgery.  Do not use any "recreational" drugs for at least a week prior to your surgery.  Please be advised that the combination of cocaine and anesthesia may have negative outcomes, up to and including death. If you test positive for cocaine, your surgery will be cancelled.  On the morning of surgery brush your teeth with toothpaste and water, you may rinse your mouth with mouthwash if you wish. Do not swallow any toothpaste or mouthwash.  Do not wear jewelry, make-up, hairpins, clips or nail polish.  Do not wear lotions, powders, or perfumes.   Do not shave body from the neck down 48 hours prior to surgery just in case you cut yourself which could leave a site for infection.  Also, freshly shaved skin may become irritated if using the CHG soap.  Contact lenses, hearing aids and dentures may not be worn into surgery.  Do not bring valuables to the hospital. [facility] is not responsible for any missing/lost belongings or valuables.   Use CHG Soap or wipes as directed on instruction sheet.  TNotify your doctor if there is any change in your medical condition (cold, fever, infection).  Wear comfortable clothing (specific to your surgery type) to the hospital.  Plan for stool softeners for home use; pain medications have a tendency  to cause constipation. You can also help prevent constipation by eating foods high in fiber such as fruits and vegetables and drinking plenty of fluids as your diet allows.  After surgery, you can help prevent lung complications by doing breathing exercises.  Take deep breaths and cough every 1-2 hours. Your doctor may order a device called an Incentive Spirometer to help you take deep breaths. When coughing or sneezing, hold a  pillow firmly against your incision with both hands. This is called "splinting." Doing this helps protect your incision. It also decreases belly discomfort.  If you are being admitted to the hospital overnight, leave your suitcase in the car. After surgery it may be brought to your room.  If you are being discharged the day of surgery, you will not be allowed to drive home. You will need a responsible adult (18 years or older) to drive you home and stay with you that night.   If you are taking public transportation, you will need to have a responsible adult (18 years or older) with you. Please confirm with your physician that it is acceptable to use public transportation.   Please call the [facility] Dept. at [phone] if you have any questions about these instructions.  Visitation Policy:  Patients undergoing a surgery or procedure may have one family member or support person with them as long as that person is not COVID-19 positive or experiencing its symptoms.  That person may remain in the waiting area during the procedure.  Inpatient Visitation Update:   In an effort to ensure the safety of our team members and our patients, we are implementing a change to our visitation policy:  Effective Monday, Aug. 9, at 7 a.m., inpatients will be allowed one support person.  o The support person may change daily.  o The support person must pass our screening, gel in and out, and wear a mask at all times, including in the patient's room.  o Patients must also wear a mask when staff or their support person are in the room.  o Masking is required regardless of vaccination status.  Systemwide, no visitors 17 or younger.

## 2020-12-14 NOTE — Telephone Encounter (Signed)
Spoke with patient. She is going to wait until after her surgery to get booster shot.

## 2020-12-15 DIAGNOSIS — X32XXXA Exposure to sunlight, initial encounter: Secondary | ICD-10-CM | POA: Diagnosis not present

## 2020-12-15 DIAGNOSIS — D485 Neoplasm of uncertain behavior of skin: Secondary | ICD-10-CM | POA: Diagnosis not present

## 2020-12-15 DIAGNOSIS — Z85828 Personal history of other malignant neoplasm of skin: Secondary | ICD-10-CM | POA: Diagnosis not present

## 2020-12-15 DIAGNOSIS — L821 Other seborrheic keratosis: Secondary | ICD-10-CM | POA: Diagnosis not present

## 2020-12-15 DIAGNOSIS — D2271 Melanocytic nevi of right lower limb, including hip: Secondary | ICD-10-CM | POA: Diagnosis not present

## 2020-12-15 DIAGNOSIS — D225 Melanocytic nevi of trunk: Secondary | ICD-10-CM | POA: Diagnosis not present

## 2020-12-15 DIAGNOSIS — D2262 Melanocytic nevi of left upper limb, including shoulder: Secondary | ICD-10-CM | POA: Diagnosis not present

## 2020-12-15 DIAGNOSIS — L570 Actinic keratosis: Secondary | ICD-10-CM | POA: Diagnosis not present

## 2020-12-15 DIAGNOSIS — C44529 Squamous cell carcinoma of skin of other part of trunk: Secondary | ICD-10-CM | POA: Diagnosis not present

## 2020-12-19 ENCOUNTER — Telehealth: Payer: Self-pay

## 2020-12-19 ENCOUNTER — Other Ambulatory Visit
Admission: RE | Admit: 2020-12-19 | Discharge: 2020-12-19 | Disposition: A | Discharge status: Home / Self Care | Payer: PPO | Source: Ambulatory Visit | Attending: Obstetrics & Gynecology | Admitting: Obstetrics & Gynecology

## 2020-12-19 ENCOUNTER — Telehealth: Payer: Self-pay | Admitting: Internal Medicine

## 2020-12-19 ENCOUNTER — Other Ambulatory Visit
Admission: RE | Admit: 2020-12-19 | Discharge: 2020-12-19 | Disposition: A | Discharge status: Home / Self Care | Payer: PPO | Source: Home / Self Care | Attending: Obstetrics & Gynecology | Admitting: Obstetrics & Gynecology

## 2020-12-19 ENCOUNTER — Other Ambulatory Visit: Payer: Self-pay

## 2020-12-19 ENCOUNTER — Other Ambulatory Visit: Payer: PPO

## 2020-12-19 DIAGNOSIS — Z01812 Encounter for preprocedural laboratory examination: Secondary | ICD-10-CM | POA: Diagnosis not present

## 2020-12-19 DIAGNOSIS — Z20822 Contact with and (suspected) exposure to covid-19: Secondary | ICD-10-CM | POA: Insufficient documentation

## 2020-12-19 DIAGNOSIS — R002 Palpitations: Secondary | ICD-10-CM | POA: Insufficient documentation

## 2020-12-19 DIAGNOSIS — E876 Hypokalemia: Secondary | ICD-10-CM | POA: Insufficient documentation

## 2020-12-19 LAB — BASIC METABOLIC PANEL
Anion gap: 7 (ref 5–15)
BUN: 13 mg/dL (ref 8–23)
CO2: 27 mmol/L (ref 22–32)
Calcium: 9.2 mg/dL (ref 8.9–10.3)
Chloride: 103 mmol/L (ref 98–111)
Creatinine, Ser: 0.63 mg/dL (ref 0.44–1.00)
GFR, Estimated: >60 mL/min (ref >60)
Glucose, Bld: 106 mg/dL — ABNORMAL HIGH (ref 70–99)
Potassium: 3.8 mmol/L (ref 3.5–5.1)
Sodium: 137 mmol/L (ref 135–145)

## 2020-12-19 LAB — CBC
HCT: 38.9 % (ref 36.0–46.0)
Hemoglobin: 13.2 g/dL (ref 12.0–15.0)
MCH: 31.8 pg (ref 26.0–34.0)
MCHC: 33.9 g/dL (ref 30.0–36.0)
MCV: 93.7 fL (ref 80.0–100.0)
Platelets: 279 10*3/uL (ref 150–400)
RBC: 4.15 MIL/uL (ref 3.87–5.11)
RDW: 12 % (ref 11.5–15.5)
WBC: 8 10*3/uL (ref 4.0–10.5)
nRBC: 0 % (ref 0.0–0.2)

## 2020-12-19 LAB — TYPE AND SCREEN
ABO/RH(D): O POS
Antibody Screen: NEGATIVE

## 2020-12-19 LAB — MAGNESIUM: Magnesium: 2.2 mg/dL (ref 1.7–2.4)

## 2020-12-19 NOTE — Addendum Note (Signed)
Addended by: Crecencio Mc on: 12/19/2020 10:14 AM   Modules accepted: Orders

## 2020-12-19 NOTE — Telephone Encounter (Signed)
Potassium is normal but low end of normal.  Can resume every other day potassium. .  Magnesium is pending

## 2020-12-19 NOTE — Telephone Encounter (Signed)
Not without a repeat potassium check, which I see has been ordered.  Can she come in today to have that lab?/ I am adding a magnesium as well    Forward to [place].  The only labs needed today are the potassium and magnesium.  The rest are hospital labs

## 2020-12-19 NOTE — Telephone Encounter (Signed)
she was taking potassium to help with a racing heartbeat. She is not taking it anymore, PCP told her to stop.. [id number],WIO is having the same symptoms as before. Should she start it again?        [place] RECORD AccessNurse Patient Name: Cindy Arnold Gender: Female DOB: August 18, 1951 Age: 69 Y 27 M 2 D Return Phone Number: [phone] (Primary) Address: City/State/Zip: [place] [address] Client [facility] Night - Cl Client Site [place] Physician Einar Pheasant - MD Contact Type Call Who Is Calling Patient / Member / Family / Caregiver Call Type Triage / Clinical Relationship To Patient Self Return Phone Number [phone] (Primary) Chief Complaint Heart palpitations or irregular heartbeat Reason for Call Symptomatic / Request for [place] states she was taking potassium to help with a racing heartbeat. She is not taking it anymore. Today,she is having the same symptoms as before. Should she start it again? Translation No Nurse Assessment Nurse: Jimmye Norman, RN, Armen Pickup Date/Time Eilene Ghazi Time): 12/15/2020 10:26:35 PM Confirm and document reason for call. If symptomatic, describe symptoms. ---Caller states she feels like her heart is racing started today. Had same symptoms the day after Thanksgiving, seen in ED, Potassium was low. Started on daily Potassium supplement. 2 days ago the nurse called and said to stop Potassium and labs ordered for Monday. Dr. Nicki Reaper is the Doctor who ordered to stop Potassium. Does the patient have any new or worsening symptoms? ---Yes Will a triage be completed? ---Yes Related visit to physician within the last 2 weeks? ---No Does the PT have any chronic conditions? (i.e. diabetes, asthma, this includes High risk factors for pregnancy, etc.) ---No Is this a behavioral health  or substance abuse call? ---No Guidelines Guideline Title Affirmed Question Affirmed Notes Nurse Date/Time Eilene Ghazi Time) Heart Rate and Heartbeat Questions Palpitations Jimmye Norman, RN, Armen Pickup 12/15/2020 10:32:33 PM Disp. Time Eilene Ghazi Time) Disposition Final User 12/15/2020 10:46:28 PM Home Care Yes Jimmye Norman, RN, Armen Pickup PLEASE NOTE: All timestamps contained within this report are represented as [place] Standard Time. CONFIDENTIALTY NOTICE: This fax transmission is intended only for the addressee. It contains information that is legally privileged, confidential or otherwise protected from use or disclosure. If you are not the intended recipient, you are strictly prohibited from reviewing, disclosing, copying using or disseminating any of this information or taking any action in reliance on or regarding this information. If you have received this fax in error, please notify us immediately by telephone so that we can arrange for its return to [place]. Phone: [phone], Toll-Free: [phone], Fax: [phone] Page: 2 of 2 Call Id: [id number] [place] Disagree/Comply Comply Caller Understands Yes PreDisposition Home Care Care Advice Given Per Guideline HOME CARE: * You should be able to treat this at home. * Everybody experiences palpitations at some point in their lives. In many circumstances it is simply a heightened awareness of the heart's normal beating. * People with anxiety or stress may describe a 'rapid heartbeat' or 'pounding' in their chest from their heart beating. AVOID CAFFEINE: * Examples include coffee, tea, colas, [address] [employer], and some 'energy drinks'. LIMIT ALCOHOL: * Limit your alcohol consumption to no more than 2 drinks a day. * If your symptoms do not improve over the next couple days, then you should make an appointment to see your doctor. CALL BACK IF: * Chest pain, lightheadedness or difficulty breathing occurs *  Heart beating over 140 beats / minute * More than  3 extra or skipped beats / minute * You become worse CARE ADVICE given per Heart Rate and Heartbeat Questions (Adult) guideline.

## 2020-12-19 NOTE — Telephone Encounter (Signed)
Patient has been informed.

## 2020-12-19 NOTE — Telephone Encounter (Signed)
Left detailed message to call back.  Patient is currently at the hospital for pre op appointment. She is needing a lab appointment today in office. potassium and magnesium only done here.

## 2020-12-19 NOTE — Progress Notes (Signed)
Her  magnesium level is fine.  see prior message about resuming potassium.  pllease obtain more detail about the palpitations:  how often?  Is heart beat irregular/  causing dyspnea or dizziness?

## 2020-12-20 LAB — SARS CORONAVIRUS 2 (TAT 6-24 HRS): SARS Coronavirus 2: NEGATIVE

## 2020-12-21 ENCOUNTER — Other Ambulatory Visit: Payer: Self-pay

## 2020-12-21 ENCOUNTER — Ambulatory Visit: Payer: PPO | Admitting: Certified Registered Nurse Anesthetist

## 2020-12-21 ENCOUNTER — Encounter
Admission: RE | Disposition: A | Discharge status: Home / Self Care | Payer: Self-pay | Source: Home / Self Care | Attending: Obstetrics & Gynecology

## 2020-12-21 ENCOUNTER — Ambulatory Visit
Admission: RE | Admit: 2020-12-21 | Discharge: 2020-12-21 | Disposition: A | Discharge status: Home / Self Care | Payer: PPO | Attending: Obstetrics & Gynecology | Admitting: Obstetrics & Gynecology

## 2020-12-21 ENCOUNTER — Encounter: Payer: Self-pay | Admitting: Obstetrics & Gynecology

## 2020-12-21 DIAGNOSIS — Z79899 Other long term (current) drug therapy: Secondary | ICD-10-CM | POA: Diagnosis not present

## 2020-12-21 DIAGNOSIS — N83292 Other ovarian cyst, left side: Secondary | ICD-10-CM | POA: Diagnosis not present

## 2020-12-21 DIAGNOSIS — N838 Other noninflammatory disorders of ovary, fallopian tube and broad ligament: Secondary | ICD-10-CM | POA: Insufficient documentation

## 2020-12-21 DIAGNOSIS — N83209 Unspecified ovarian cyst, unspecified side: Secondary | ICD-10-CM | POA: Diagnosis not present

## 2020-12-21 DIAGNOSIS — Z853 Personal history of malignant neoplasm of breast: Secondary | ICD-10-CM | POA: Insufficient documentation

## 2020-12-21 DIAGNOSIS — Z85828 Personal history of other malignant neoplasm of skin: Secondary | ICD-10-CM | POA: Diagnosis not present

## 2020-12-21 DIAGNOSIS — Z9189 Other specified personal risk factors, not elsewhere classified: Secondary | ICD-10-CM | POA: Diagnosis not present

## 2020-12-21 DIAGNOSIS — K7689 Other specified diseases of liver: Secondary | ICD-10-CM | POA: Insufficient documentation

## 2020-12-21 DIAGNOSIS — N83201 Unspecified ovarian cyst, right side: Secondary | ICD-10-CM | POA: Insufficient documentation

## 2020-12-21 DIAGNOSIS — K668 Other specified disorders of peritoneum: Secondary | ICD-10-CM | POA: Diagnosis not present

## 2020-12-21 HISTORY — PX: LAPAROSCOPIC BILATERAL SALPINGO OOPHERECTOMY: SHX5890

## 2020-12-21 LAB — ABO/RH: ABO/RH(D): O POS

## 2020-12-21 SURGERY — SALPINGO-OOPHORECTOMY, BILATERAL, LAPAROSCOPIC
Anesthesia: General | Laterality: Bilateral

## 2020-12-21 MED ORDER — OXYCODONE HCL 5 MG PO TABS: 5.0000 mg | ORAL_TABLET | ORAL | 0 refills | Status: DC | PRN

## 2020-12-21 MED ORDER — FAMOTIDINE 20 MG PO TABS: 20.0000 mg | ORAL_TABLET | Freq: Once | ORAL | Status: AC

## 2020-12-21 MED ORDER — FENTANYL CITRATE (PF) 100 MCG/2ML IJ SOLN
INTRAMUSCULAR | Status: DC | PRN
  Administered 2020-12-21 (×2): 50 ug via INTRAVENOUS

## 2020-12-21 MED ORDER — EPHEDRINE 5 MG/ML INJ
INTRAVENOUS | Status: AC
  Filled 2020-12-21: qty 10

## 2020-12-21 MED ORDER — EPHEDRINE SULFATE 50 MG/ML IJ SOLN
INTRAMUSCULAR | Status: DC | PRN
  Administered 2020-12-21: 20 mg via INTRAVENOUS
  Administered 2020-12-21: 5 mg via INTRAVENOUS

## 2020-12-21 MED ORDER — PHENYLEPHRINE HCL (PRESSORS) 10 MG/ML IV SOLN
INTRAVENOUS | Status: DC | PRN
  Administered 2020-12-21 (×5): 100 ug via INTRAVENOUS

## 2020-12-21 MED ORDER — LIDOCAINE HCL (CARDIAC) PF 100 MG/5ML IV SOSY
PREFILLED_SYRINGE | INTRAVENOUS | Status: DC | PRN
  Administered 2020-12-21: 100 mg via INTRAVENOUS

## 2020-12-21 MED ORDER — VASOPRESSIN 20 UNIT/ML IV SOLN
INTRAVENOUS | Status: AC
  Filled 2020-12-21: qty 1

## 2020-12-21 MED ORDER — CHLORHEXIDINE GLUCONATE 0.12 % MT SOLN
OROMUCOSAL | Status: AC
  Administered 2020-12-21: 08:00:00 15 mL via OROMUCOSAL
  Filled 2020-12-21: qty 15

## 2020-12-21 MED ORDER — FAMOTIDINE 20 MG PO TABS
ORAL_TABLET | ORAL | Status: AC
  Administered 2020-12-21: 08:00:00 20 mg via ORAL
  Filled 2020-12-21: qty 1

## 2020-12-21 MED ORDER — GLYCOPYRROLATE 0.2 MG/ML IJ SOLN
INTRAMUSCULAR | Status: DC | PRN
  Administered 2020-12-21: .2 mg via INTRAVENOUS

## 2020-12-21 MED ORDER — OXYCODONE HCL 5 MG PO TABS
ORAL_TABLET | ORAL | Status: AC
  Filled 2020-12-21: qty 1

## 2020-12-21 MED ORDER — ACETAMINOPHEN 500 MG PO TABS: 1000.0000 mg | ORAL_TABLET | ORAL | Status: AC

## 2020-12-21 MED ORDER — GABAPENTIN 300 MG PO CAPS
ORAL_CAPSULE | ORAL | Status: AC
  Administered 2020-12-21: 08:00:00 600 mg via ORAL
  Filled 2020-12-21: qty 2

## 2020-12-21 MED ORDER — LIDOCAINE HCL (PF) 2 % IJ SOLN
INTRAMUSCULAR | Status: AC
  Filled 2020-12-21: qty 5

## 2020-12-21 MED ORDER — DEXAMETHASONE SODIUM PHOSPHATE 10 MG/ML IJ SOLN
INTRAMUSCULAR | Status: AC
  Filled 2020-12-21: qty 1

## 2020-12-21 MED ORDER — KETOROLAC TROMETHAMINE 15 MG/ML IJ SOLN
INTRAMUSCULAR | Status: AC
  Administered 2020-12-21: 08:00:00 15 mg via INTRAVENOUS
  Filled 2020-12-21: qty 1

## 2020-12-21 MED ORDER — OXYCODONE HCL 5 MG PO TABS
5.0000 mg | ORAL_TABLET | Freq: Once | ORAL | Status: AC
  Administered 2020-12-21: 11:00:00 5 mg via ORAL

## 2020-12-21 MED ORDER — CHLORHEXIDINE GLUCONATE 0.12 % MT SOLN: 15.0000 mL | Freq: Once | OROMUCOSAL | Status: AC

## 2020-12-21 MED ORDER — ORAL CARE MOUTH RINSE: 15.0000 mL | Freq: Once | OROMUCOSAL | Status: AC

## 2020-12-21 MED ORDER — ONDANSETRON HCL 4 MG/2ML IJ SOLN
INTRAMUSCULAR | Status: DC | PRN
  Administered 2020-12-21: 4 mg via INTRAVENOUS

## 2020-12-21 MED ORDER — LACTATED RINGERS IV SOLN: INTRAVENOUS | Status: DC

## 2020-12-21 MED ORDER — KETOROLAC TROMETHAMINE 30 MG/ML IJ SOLN
INTRAMUSCULAR | Status: AC
  Filled 2020-12-21: qty 1

## 2020-12-21 MED ORDER — MIDAZOLAM HCL 2 MG/2ML IJ SOLN
INTRAMUSCULAR | Status: AC
  Filled 2020-12-21: qty 2

## 2020-12-21 MED ORDER — ONDANSETRON HCL 4 MG/2ML IJ SOLN: 4.0000 mg | Freq: Once | INTRAMUSCULAR | Status: DC | PRN

## 2020-12-21 MED ORDER — KETOROLAC TROMETHAMINE 30 MG/ML IJ SOLN
INTRAMUSCULAR | Status: DC | PRN
  Administered 2020-12-21: 15 mg via INTRAVENOUS

## 2020-12-21 MED ORDER — ACETAMINOPHEN 500 MG PO TABS
ORAL_TABLET | ORAL | Status: AC
  Administered 2020-12-21: 08:00:00 1000 mg via ORAL
  Filled 2020-12-21: qty 2

## 2020-12-21 MED ORDER — PROPOFOL 10 MG/ML IV BOLUS
INTRAVENOUS | Status: DC | PRN
  Administered 2020-12-21: 130 mg via INTRAVENOUS

## 2020-12-21 MED ORDER — PROPOFOL 10 MG/ML IV BOLUS
INTRAVENOUS | Status: AC
  Filled 2020-12-21: qty 40

## 2020-12-21 MED ORDER — SCOPOLAMINE 1 MG/3DAYS TD PT72: 1.0000 | MEDICATED_PATCH | TRANSDERMAL | Status: DC

## 2020-12-21 MED ORDER — GABAPENTIN 300 MG PO CAPS: 600.0000 mg | ORAL_CAPSULE | ORAL | Status: AC

## 2020-12-21 MED ORDER — ROCURONIUM BROMIDE 100 MG/10ML IV SOLN
INTRAVENOUS | Status: DC | PRN
  Administered 2020-12-21: 10 mg via INTRAVENOUS
  Administered 2020-12-21: 40 mg via INTRAVENOUS
  Administered 2020-12-21: 10 mg via INTRAVENOUS

## 2020-12-21 MED ORDER — FENTANYL CITRATE (PF) 100 MCG/2ML IJ SOLN: 25.0000 ug | INTRAMUSCULAR | Status: DC | PRN

## 2020-12-21 MED ORDER — FENTANYL CITRATE (PF) 100 MCG/2ML IJ SOLN
INTRAMUSCULAR | Status: AC
  Filled 2020-12-21: qty 2

## 2020-12-21 MED ORDER — SCOPOLAMINE 1 MG/3DAYS TD PT72
MEDICATED_PATCH | TRANSDERMAL | Status: AC
  Administered 2020-12-21: 08:00:00 1.5 mg via TRANSDERMAL
  Filled 2020-12-21: qty 1

## 2020-12-21 MED ORDER — KETOROLAC TROMETHAMINE 15 MG/ML IJ SOLN: 15.0000 mg | INTRAMUSCULAR | Status: AC

## 2020-12-21 MED ORDER — ROCURONIUM BROMIDE 10 MG/ML (PF) SYRINGE
PREFILLED_SYRINGE | INTRAVENOUS | Status: AC
  Filled 2020-12-21: qty 10

## 2020-12-21 MED ORDER — DEXAMETHASONE SODIUM PHOSPHATE 10 MG/ML IJ SOLN
INTRAMUSCULAR | Status: DC | PRN
  Administered 2020-12-21: 10 mg via INTRAVENOUS

## 2020-12-21 MED ORDER — ONDANSETRON HCL 4 MG/2ML IJ SOLN
INTRAMUSCULAR | Status: AC
  Filled 2020-12-21: qty 2

## 2020-12-21 MED ORDER — SUGAMMADEX SODIUM 200 MG/2ML IV SOLN
INTRAVENOUS | Status: DC | PRN
  Administered 2020-12-21: 150 mg via INTRAVENOUS

## 2020-12-21 SURGICAL SUPPLY — 62 items
ADH SKN CLS APL DERMABOND .7 (GAUZE/BANDAGES/DRESSINGS)
APL PRP STRL LF DISP 70% ISPRP (MISCELLANEOUS) ×1
BAG DRN RND TRDRP ANRFLXCHMBR (UROLOGICAL SUPPLIES) ×1
BAG SPEC RTRVL LRG 6X4 10 (ENDOMECHANICALS) ×1
BAG URINE DRAIN 2000ML AR STRL (UROLOGICAL SUPPLIES) ×3 IMPLANT
BLADE SURG SZ11 CARB STEEL (BLADE) ×3 IMPLANT
CATH FOLEY 2WAY  5CC 16FR (CATHETERS) ×3
CATH FOLEY 2WAY 5CC 16FR (CATHETERS) ×1
CATH URTH 16FR FL 2W BLN LF (CATHETERS) ×1 IMPLANT
CHLORAPREP W/TINT 26 (MISCELLANEOUS) ×3 IMPLANT
CNTNR SPEC 2.5X3XGRAD LEK (MISCELLANEOUS) ×1
CONT SPEC 4OZ STER OR WHT (MISCELLANEOUS) ×2
CONT SPEC 4OZ STRL OR WHT (MISCELLANEOUS) ×1
CONTAINER SPEC 2.5X3XGRAD LEK (MISCELLANEOUS) IMPLANT
COVER WAND RF STERILE (DRAPES) ×3 IMPLANT
DERMABOND ADVANCED (GAUZE/BANDAGES/DRESSINGS)
DERMABOND ADVANCED .7 DNX12 (GAUZE/BANDAGES/DRESSINGS) IMPLANT
DRAPE LEGGINS SURG 28X43 STRL (DRAPES) ×3 IMPLANT
DRAPE UNDER BUTTOCK W/FLU (DRAPES) ×3 IMPLANT
DRSG TELFA 4X3 1S NADH ST (GAUZE/BANDAGES/DRESSINGS) ×2 IMPLANT
ELECT REM PT RETURN 9FT ADLT (ELECTROSURGICAL) ×3
ELECTRODE REM PT RTRN 9FT ADLT (ELECTROSURGICAL) IMPLANT
GLOVE PI ORTHOPRO 6.5 (GLOVE) ×2
GLOVE PI ORTHOPRO STRL 6.5 (GLOVE) ×1 IMPLANT
GLOVE SURG SYN 6.5 ES PF (GLOVE) ×3 IMPLANT
GLOVE SURG SYN 6.5 PF PI (GLOVE) ×1 IMPLANT
GOWN STRL REUS W/ TWL LRG LVL3 (GOWN DISPOSABLE) ×2 IMPLANT
GOWN STRL REUS W/ TWL XL LVL3 (GOWN DISPOSABLE) ×1 IMPLANT
GOWN STRL REUS W/TWL LRG LVL3 (GOWN DISPOSABLE) ×6
GOWN STRL REUS W/TWL XL LVL3 (GOWN DISPOSABLE) ×3
GRASPER SUT TROCAR 14GX15 (MISCELLANEOUS) ×2 IMPLANT
IRRIGATION STRYKERFLOW (MISCELLANEOUS) IMPLANT
IRRIGATOR STRYKERFLOW (MISCELLANEOUS)
IV LACTATED RINGERS 1000ML (IV SOLUTION) ×3 IMPLANT
KIT PINK PAD W/HEAD ARE REST (MISCELLANEOUS) ×3
KIT PINK PAD W/HEAD ARM REST (MISCELLANEOUS) ×1 IMPLANT
KIT TURNOVER CYSTO (KITS) ×3 IMPLANT
KITTNER LAPARASCOPIC 5X40 (MISCELLANEOUS) ×2 IMPLANT
L-HOOK LAP DISP 36CM (ELECTROSURGICAL)
LABEL OR SOLS (LABEL) IMPLANT
LHOOK LAP DISP 36CM (ELECTROSURGICAL) IMPLANT
LIGASURE LAP MARYLAND 5MM 37CM (ELECTROSURGICAL) ×2 IMPLANT
LIGASURE VESSEL 5MM BLUNT TIP (ELECTROSURGICAL) IMPLANT
MANIFOLD NEPTUNE II (INSTRUMENTS) ×3 IMPLANT
MANIPULATOR UTERINE 4.5 ZUMI (MISCELLANEOUS) IMPLANT
NS IRRIG 500ML POUR BTL (IV SOLUTION) ×3 IMPLANT
PACK LAP CHOLECYSTECTOMY (MISCELLANEOUS) ×3 IMPLANT
PAD OB MATERNITY 4.3X12.25 (PERSONAL CARE ITEMS) ×3 IMPLANT
PAD PREP 24X41 OB/GYN DISP (PERSONAL CARE ITEMS) ×3 IMPLANT
PENCIL ELECTRO HAND CTR (MISCELLANEOUS) IMPLANT
POUCH SPECIMEN RETRIEVAL 10MM (ENDOMECHANICALS) ×2 IMPLANT
SET TUBE SMOKE EVAC HIGH FLOW (TUBING) ×3 IMPLANT
SLEEVE ENDOPATH XCEL 5M (ENDOMECHANICALS) ×4 IMPLANT
SUT MNCRL 4-0 (SUTURE) ×3
SUT MNCRL 4-0 27XMFL (SUTURE) ×1
SUT MNCRL AB 4-0 PS2 18 (SUTURE) ×1 IMPLANT
SUT VICRYL 0 AB UR-6 (SUTURE) ×2 IMPLANT
SUTURE MNCRL 4-0 27XMF (SUTURE) ×1 IMPLANT
SYR 50ML LL SCALE MARK (SYRINGE) ×2 IMPLANT
TROCAR ENDO BLADELESS 11MM (ENDOMECHANICALS) ×2 IMPLANT
TROCAR XCEL NON-BLD 5MMX100MML (ENDOMECHANICALS) ×3 IMPLANT
TUBING ART PRESS 48 MALE/FEM (TUBING) IMPLANT

## 2020-12-21 NOTE — H&P (Addendum)
Day-of-Surgery Preoperative History and Physical   Cindy Arnold is a 68 y.o. No obstetric history on file. here for surgical management of enlarging ovarian cyst.   Preoperative concerns have been addressed.  CA 125: 8.1 (wnl) He4: 55.4 (wnl) Ultrasound: formerly stable 2.5cm ovarian cyst enlarged to 5.0cm.  Remains simple.   Proposed surgery: laparoscopic bilateral salpingo-oophorectomy  Past Medical History:  Diagnosis Date  . Basal cell carcinoma    multiple removed/[facility]  . Breast cancer ([facility]) 12/13   s/p right lumpectomy, s/p chemo and XRT  . DES exposure in utero   . Hepatitis A   . History of chicken pox   . Hyperactive airway disease   . 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 Migraines   . Miscarriage    x3   Past Surgical History:  Procedure Laterality Date  . BASAL CELL CARCINOMA EXCISION     Multiple  . BREAST BIOPSY  1975   Cyst  . BREAST LUMPECTOMY WITH AXILLARY LYMPH NODE DISSECTION  01/06/13, 01/22/13   Dr. Drue Dun, [facility], second surgery for pos margin  . CERVICAL FUSION  2001  . COLONOSCOPY    . DILATION AND CURETTAGE OF UTERUS     x 3  . VAGINAL DELIVERY     x4  . WISDOM TOOTH EXTRACTION     OB History  No obstetric history on file.  Patient denies any other pertinent gynecologic issues.   No current facility-administered medications on file prior to encounter.   Current Outpatient Medications on File Prior to Encounter  Medication Sig Dispense Refill  . albuterol (VENTOLIN HFA) 108 (90 Base) MCG/ACT inhaler Inhale 2 puffs into the lungs every 6 (six) hours as needed for wheezing or shortness of breath. 18 g 0  . Cholecalciferol (VITAMIN D3) 125 MCG (5000 UT) CAPS Take 5,000 Units by mouth daily.    . Multiple Vitamins-Minerals (MULTIVITAMIN WOMENS 50+ ADV PO) Take 1 tablet by mouth daily.    . pantoprazole (PROTONIX) 20 MG tablet TAKE ONE TABLET TWICE DAILY BEFORE A MEAL (Patient taking differently: Take 20 mg by mouth 2 (two) times  daily.) 180 tablet 1  . propranolol (INDERAL) 40 MG tablet TAKE 1 TABLET BY MOUTH DAILY (Patient taking differently: Take 40 mg by mouth daily.) 90 tablet 1  . Riboflavin (VITAMIN B2 PO) Take 250 mg by mouth daily.    Marland Kitchen BIOTIN PO Take 10,000 mg by mouth daily.     . butalbital-aspirin-caffeine (FIORINAL) 50-325-40 MG capsule TAKE 1 CAPSULE BY MOUTH TWICE DAILY AS NEEDED FOR HEADACHE (Patient taking differently: Take 1 capsule by mouth 2 (two) times daily as needed for migraine. TAKE 1 CAPSULE BY MOUTH TWICE DAILY AS NEEDED FOR HEADACHE) 30 capsule 0  . Glucosamine-Chondroitin (MOVE FREE PO) Take 1 capsule by mouth daily. Cartilage/boron/hyalauronic acid    . hydrOXYzine (ATARAX/VISTARIL) 10 MG tablet TAKE ONE TABLET BY MOUTH EVERY DAY AS NEEDED FOR ITCHING (Patient taking differently: Take 10 mg by mouth daily as needed for itching. TAKE ONE TABLET BY MOUTH EVERY DAY AS NEEDED FOR ITCHING) 30 tablet 1  . LORazepam (ATIVAN) 0.5 MG tablet TAKE ONE TABLET BY MOUTH EVERY DAY AS NEEDED FOR ANXIETY (Patient taking differently: Take 0.5 mg by mouth daily as needed for anxiety. TAKE ONE TABLET BY MOUTH EVERY DAY AS NEEDED FOR ANXIETY) 30 tablet 0  . MAGNESIUM BISGLYCINATE PO Take 200 mg by mouth daily.    . Multiple Minerals-Vitamins (CALCIUM CITRATE PLUS/MAGNESIUM PO) Take 2 tablets  by mouth 2 (two) times daily. CAL CITRATE 500-MAGNESIUM 80-ZINC-10    . Omega-3 Fatty Acids (OMEGA-3 FISH OIL PO) Take 1,280 mg by mouth 2 (two) times daily.    . Potassium Chloride ER 20 MEQ TBCR Take 20 mEq by mouth daily. (Patient taking differently: Take 20 mEq by mouth every other day.) 30 tablet 0  . TURMERIC CURCUMIN PO Take 2,250 mg by mouth in the morning and at bedtime.    Marland Kitchen zolpidem (AMBIEN) 5 MG tablet TAKE 1 TABLET AT BEDTIME AS NEEDED (Patient taking differently: Take 5 mg by mouth at bedtime as needed for sleep.) 30 tablet 0   No Known Allergies  Social History:   reports that she has never smoked. She has never  used smokeless tobacco. She reports current alcohol use. She reports that she does not use drugs.  Family History  Problem Relation Age of Onset  . Pancreatic cancer Mother   . Cancer Mother        Pancreatic  . Arthritis Mother   . Hyperlipidemia Mother   . Hypertension Mother   . Heart attack Father   . Hyperlipidemia Father   . Hypertension Father   . Heart disease Father 87  . Colon cancer Other   . Cancer Other 50       Breast and Ovarian- dx 50's/Colon  . Birth defects Maternal Aunt        Breast - in 60's  . Stroke Maternal Grandmother   . Birth defects Maternal Aunt        Breast - in 14's    Review of Systems: Noncontributory  PHYSICAL EXAM: Blood pressure 126/79, pulse 79, temperature 98.4 F (36.9 C), temperature source Oral, resp. rate 16, height 5\' 8"  (1.727 m), weight 66.7 kg, SpO2 98 %. General appearance - alert, well appearing, and in no distress Chest - clear to auscultation, symmetric air entry, normal respiratory effort Heart - normal rate and regular rhythm Abdomen - soft, nontender, non-distended Pelvic - examination not performed in preop area Extremities - peripheral pulses normal, no pedal edema, no clubbing or cyanosis  Labs: Results for orders placed or performed during the hospital encounter of 12/19/20 (from the past 336 hour(s))  SARS CORONAVIRUS 2 (TAT 6-24 HRS) Nasopharyngeal Nasopharyngeal Swab   Collection Time: 12/19/20  9:13 AM   Specimen: Nasopharyngeal Swab  Result Value Ref Range   SARS Coronavirus 2 NEGATIVE NEGATIVE  Type and screen [facility]   Collection Time: 12/19/20  9:31 AM  Result Value Ref Range   ABO/RH(D) O POS    Antibody Screen NEG    Sample Expiration 01/02/2021,2359    Extend sample reason      NO TRANSFUSIONS OR PREGNANCY IN THE PAST 3 MONTHS Performed at [facility], [address] [place], [place]   CBC   Collection Time: 12/19/20  9:39 AM  Result Value Ref  Range   WBC 8.0 4.0 - 10.5 K/uL   RBC 4.15 3.87 - 5.11 MIL/uL   Hemoglobin 13.2 12.0 - 15.0 g/dL   HCT 12/21/20 19.5 - 09.3 %   MCV 93.7 80.0 - 100.0 fL   MCH 31.8 26.0 - 34.0 pg   MCHC 33.9 30.0 - 36.0 g/dL   RDW 26.7 12.4 - 58.0 %   Platelets 279 150 - 400 K/uL   nRBC 0.0 0.0 - 0.2 %  Basic metabolic panel   Collection Time: 12/19/20  9:39 AM  Result Value Ref Range  Sodium 137 135 - 145 mmol/L   Potassium 3.8 3.5 - 5.1 mmol/L   Chloride 103 98 - 111 mmol/L   CO2 27 22 - 32 mmol/L   Glucose, Bld 106 (H) 70 - 99 mg/dL   BUN 13 8 - 23 mg/dL   Creatinine, Ser 0.63 0.44 - 1.00 mg/dL   Calcium 9.2 8.9 - 10.3 mg/dL   GFR, Estimated >60 >60 mL/min   Anion gap 7 5 - 15  Results for orders placed or performed during the hospital encounter of 12/19/20 (from the past 336 hour(s))  Magnesium   Collection Time: 12/19/20  9:31 AM  Result Value Ref Range   Magnesium 2.2 1.7 - 2.4 mg/dL  Results for orders placed or performed in visit on 12/12/20 (from the past 336 hour(s))  Potassium   Collection Time: 12/12/20 10:07 AM  Result Value Ref Range   Potassium 4.0 3.5 - 5.1 mEq/L    Imaging Studies: No results found.  Assessment: Patient Active Problem List   Diagnosis Date Noted  . Hypokalemia 11/22/2020  . Urinary urgency 01/10/2020  . Left hip pain 10/04/2018  . GERD (gastroesophageal reflux disease) 08/10/2018  . Numbness of right hand 08/10/2018  . Neck pain 02/04/2018  . Cough 11/14/2015  . Ovarian cyst 04/20/2015  . Palpitations 01/18/2015  . Anxiety state 01/18/2015  . Healthcare maintenance 11/05/2013  . Osteopenia 10/06/2013  . History of breast cancer 12/03/2012  . Screening for breast cancer 09/24/2012  . Migraines 02/14/2012  . Asthma 02/14/2012  . Insomnia 02/14/2012    Plan: Patient will undergo surgical management with laparoscopic BSO.   The risks of surgery were discussed in detail with the patient including but not limited to: bleeding which may require  transfusion or reoperation; infection which may require antibiotics; injury to surrounding organs which may involve bowel, bladder, ureters ; need for additional procedures including laparoscopy or laparotomy; thromboembolic phenomenon, surgical site problems and other postoperative/anesthesia complications. Likelihood of success in alleviating the patient's condition was discussed. Routine postoperative instructions will be reviewed with the patient and her family in detail after surgery.  The patient concurred with the proposed plan, giving informed written consent for the surgery.  Patient has been NPO since last night she will remain NPO for procedure.  Anesthesia and OR aware.   To OR when ready.  ----- Larey Days, MD, [place] Attending Obstetrician and Gynecologist [facility], Department of [facility]  12/21/2020 8:00 AM

## 2020-12-21 NOTE — Anesthesia Postprocedure Evaluation (Signed)
Anesthesia Post Note  Patient: Cindy Arnold  Procedure(s) Performed: LAPAROSCOPIC BILATERAL SALPINGO OOPHORECTOMY (Bilateral )  Patient location during evaluation: PACU Anesthesia Type: General Level of consciousness: awake and alert and oriented Pain management: pain level controlled Vital Signs Assessment: post-procedure vital signs reviewed and stable Respiratory status: spontaneous breathing Cardiovascular status: blood pressure returned to baseline Anesthetic complications: no   No complications documented.   Last Vitals:  Vitals:   12/21/20 1104 12/21/20 1153  BP: 128/60 108/67  Pulse: (!) 56 64  Resp: 16 16  Temp: (!) 35.5 C (!) 36.4 C  SpO2: 100% 100%    Last Pain:  Vitals:   12/21/20 1153  TempSrc: Temporal  PainSc: 2                  Seraphim Affinito

## 2020-12-21 NOTE — Anesthesia Procedure Notes (Signed)
Procedure Name: Intubation Date/Time: 12/21/2020 8:38 AM Performed by: Debe Coder, CRNA Pre-anesthesia Checklist: Patient identified, Emergency Drugs available, Suction available and Patient being monitored Patient Re-evaluated:Patient Re-evaluated prior to induction Oxygen Delivery Method: Circle system utilized Preoxygenation: Pre-oxygenation with 100% oxygen Induction Type: IV induction Ventilation: Mask ventilation without difficulty Laryngoscope Size: Mac and 3 Grade View: Grade II Tube type: Oral Tube size: 7.0 mm Number of attempts: 1 Airway Equipment and Method: Stylet Placement Confirmation: ETT inserted through vocal cords under direct vision,  positive ETCO2 and breath sounds checked- equal and bilateral Secured at: 21 cm Tube secured with: Tape Dental Injury: Teeth and Oropharynx as per pre-operative assessment

## 2020-12-21 NOTE — Anesthesia Preprocedure Evaluation (Signed)
Anesthesia Evaluation  Patient identified by MRN, date of birth, ID band Patient awake    Reviewed: Allergy & Precautions, NPO status , Patient's Chart, lab work & pertinent test results  Airway Mallampati: II  TM Distance: <3 FB     Dental   Pulmonary asthma ,    Pulmonary exam normal        Cardiovascular negative cardio ROS Normal cardiovascular exam     Neuro/Psych  Headaches, Anxiety    GI/Hepatic GERD  ,(+) Hepatitis -, A  Endo/Other  negative endocrine ROS  Renal/GU negative Renal ROS  negative genitourinary   Musculoskeletal   Abdominal Normal abdominal exam  (+)   Peds negative pediatric ROS (+)  Hematology negative hematology ROS (+)   Anesthesia Other Findings Past Medical History: No date: Basal cell carcinoma     Comment:  multiple removed/[facility] 12/13: Breast cancer ([facility])     Comment:  s/p right lumpectomy, s/p chemo and XRT No date: DES exposure in utero No date: Hepatitis A No date: History of chicken pox No date: Hyperactive airway disease No date: 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[facility]) No date: Migraines No date: Miscarriage     Comment:  x3  Reproductive/Obstetrics                             Anesthesia Physical Anesthesia Plan  ASA: II  Anesthesia Plan: General   Post-op Pain Management:    Induction: Intravenous  PONV Risk Score and Plan:   Airway Management Planned: Oral ETT  Additional Equipment:   Intra-op Plan:   Post-operative Plan: Extubation in OR  Informed Consent: I have reviewed the patients History and Physical, chart, labs and discussed the procedure including the risks, benefits and alternatives for the proposed anesthesia with the patient or authorized representative who has indicated his/her understanding and acceptance.     Dental advisory given  Plan Discussed with: CRNA and Surgeon  Anesthesia Plan Comments:          Anesthesia Quick Evaluation

## 2020-12-21 NOTE — Transfer of Care (Signed)
Immediate Anesthesia Transfer of Care Note  Patient: Cindy Arnold  Procedure(s) Performed: LAPAROSCOPIC BILATERAL SALPINGO OOPHORECTOMY (Bilateral )  Patient Location: PACU  Anesthesia Type:General  Level of Consciousness: awake, alert  and oriented  Airway & Oxygen Therapy: Patient Spontanous Breathing  Post-op Assessment: Report given to RN and Post -op Vital signs reviewed and stable  Post vital signs: Reviewed and stable  Last Vitals:  Vitals Value Taken Time  BP 115/62 12/21/20 1007  Temp 36 C 12/21/20 1007  Pulse 70 12/21/20 1011  Resp 7 12/21/20 1011  SpO2 98 % 12/21/20 1011    Last Pain:  Vitals:   12/21/20 1007  TempSrc:   PainSc: 0-No pain         Complications: No complications documented.

## 2020-12-21 NOTE — Op Note (Signed)
Cindy Arnold PROCEDURE DATE: 12/21/2020  PATIENT:  Cindy Arnold  69 y.o. female  PRE-OPERATIVE DIAGNOSIS:  enlarging ovarian cyst, DES exposure in utero, history of skin cancer and breast cancer.  POST-OPERATIVE DIAGNOSIS:  same, diffuse hemosiderin deposits, liver serosal abnormality  PROCEDURE:  Procedure(s): LAPAROSCOPIC BILATERAL SALPINGO OOPHORECTOMY (Bilateral)  SURGEON:  Surgeon(s) and Role:    * Katria Botts, Elenora Fender, MD - Primary    * Christeen Douglas, MD - Assisting  ANESTHESIA:  General via ET  I/O  Total I/O In: 1000 [I.V.:1000] Out: 105 [Urine:100; Blood:5]  FINDINGS:  Normal external genitalia Small, underdeveloped uterus (consistent with DES exposure) Previously ligated fallopian tubes Left cystic ovary, normal right ovary Normal upper abdomen except for white/gray fibrous rind adherent to the surface of the liver diffusely (bilterally) Diffuse abdominal and pelvic peritoneal speckled hemosiderin deposits   SPECIMEN:  1. Bilateral tubes and ovaries (left with cyst) 2. Excision of right posterior culdesac peritoneum 3. Pelvic washings 4. Liver surface cytology  COMPLICATIONS: none apparent  DISPOSITION: vital signs stable to PACU   Indication for Surgery: 69 y.o. 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   Risks of surgery were discussed with the patient including but not limited to: bleeding which may require transfusion or reoperation; infection which may require antibiotics; injury to bowel, bladder, ureters or other surrounding organs; need for additional procedures including laparotomy, blood clot, incisional problems and other postoperative/anesthesia complications. Written informed consent was obtained.     PROCEDURE IN DETAIL:  The patient had sequential compression devices applied to her lower extremities while in the preoperative area.  She was then taken to the operating room where general  anesthesia was administered and was found to be adequate.  She was placed in the dorsal lithotomy position, and was prepped and draped in a sterile manner.  A Foley catheter was inserted into her bladder and drained, and a uterine manipulator was then advanced into the uterus .  After a surgical timeout was performed, attention was turned to the abdomen where an umbilical incision was made with the scalpel. A 36mm trochar was inserted in the umbilical incision using a visiport method. Opening pressure was , and the abdomen was insufflated to carbon dioxide gas and adequate pneumoperitoneum was obtained.  A survey of the patient's pelvis and abdomen revealed the findings as mentioned above. Two ports were inserted in the lower left and right quadrants under visualization (55mm right, 90mm left).    The bilateral ureters were seen vermiculating.  The bilateral IP ligaments were isolated thrice cauterized with the LigaSure, and transected.  The mesosalpinx was divided along the round ligament and the utero-ovarian ligaments and vessels were cauterized and transected.  Each ovarian tubal complex was placed in the posterior cul-de-sac.  The peritoneum in the posterior cul-de-sac was tented medially and the LigaSure was used to obtain an excision with the hemosiderin deposits.  A Endopouch was inserted and the bilateral tubes and ovaries were placed into the bag. The operative site was surveyed, and it was found to be hemostatic.  The patient was flattened out and the camera turned to the liver.  The fibrous rind was again investigated and noted to not be able to be separated from the liver.  Graspers were used then to try and scraped some of the surface of this rind off to collect some cells.  A Kitner was then inserted and cells obtained by  gently scraping the surface.  We discussed with pathology the proper way to capture the cells, and 2 separate cytologic collection techniques were used.  There was no  bleeding or damage to the liver observed.   No intraoperative injury to surrounding organs was noted.    The fascia of the 11 mm port site was closed with the inlet closure device. The abdomen was desufflated and all instruments were then removed from the patient's abdomen. The uterine manipulator was removed without complications.  All skin incisions were closed with 4-0 monocryl and covered with surgical glue. The patient tolerated the procedures well.  All instruments, needles, and sponge counts were correct x 2. The patient was taken to the recovery room in stable condition.   ---- Larey Days, MD Attending Obstetrician and [facility]

## 2020-12-21 NOTE — Discharge Instructions (Signed)
Discharge instructions:  Call office if you have any of the following: fever >101 F, chills, shortness of breath, excessive vaginal bleeding, incision drainage or problems, leg pain or redness, or any other concerns.   Activity: Do not lift > 20 lbs for 2 weeks.  No driving until you are certain you can slam on the brakes, and of course never while taking narcotics.   You may feel some pain in your upper right abdomen/rib and right shoulder.  This is from the gas in the abdomen for surgery. This will subside over time, please be patient!  Take 600mg  Ibuprofen and 1000mg  Tylenol together,(or your Excedrin Migraine - your preference) around the clock, every 6 hours for at least the first 3-5 days.  After this you can take as needed.  This will help decrease inflammation and promote healing.  The narcotics you'll take just as needed, as they just trick your brain into thinking its not in pain.  Wear the abdominal binder over your incisions for the first few days as often as you can tolerate it.  It is a surprisingly really good way to control pain.   Please don't limit yourself in terms of routine activity.  You will be able to do most things, although they may take longer to do or be a little painful.  You can do it!  Don't be a hero, but don't be a wimp either!    AMBULATORY SURGERY  DISCHARGE INSTRUCTIONS   1) The drugs that you were given will stay in your system until tomorrow so for the next 24 hours you should not:  A) Drive an automobile B) Make any legal decisions C) Drink any alcoholic beverage   2) You may resume regular meals tomorrow.  Today it is better to start with liquids and gradually work up to solid foods.  You may eat anything you prefer, but it is better to start with liquids, then soup and crackers, and gradually work up to solid foods.   3) Please notify your doctor immediately if you have any unusual bleeding, trouble breathing, redness and pain at the surgery  site, drainage, fever, or pain not relieved by medication. 4)   5) Your post-operative visit with Dr.                                     is: Date:                        Time:    Please call to schedule your post-operative visit.  6) Additional Instructions:

## 2020-12-22 ENCOUNTER — Encounter: Payer: Self-pay | Admitting: Obstetrics & Gynecology

## 2020-12-22 LAB — CYTOLOGY - NON PAP

## 2020-12-26 ENCOUNTER — Encounter: Payer: Self-pay | Admitting: Internal Medicine

## 2020-12-26 DIAGNOSIS — E876 Hypokalemia: Secondary | ICD-10-CM

## 2020-12-26 LAB — SURGICAL PATHOLOGY

## 2020-12-26 NOTE — Telephone Encounter (Signed)
Please call and confirm pt doing ok.  She can continue taking the potassium qod for now.  Plan for f/u potassium recheck in 3 weeks.  Also, if needs, can schedule f/u appt for further evaluation of increased heart rate/palpitations.

## 2021-01-02 ENCOUNTER — Other Ambulatory Visit: Payer: Self-pay | Admitting: Internal Medicine

## 2021-01-02 DIAGNOSIS — H903 Sensorineural hearing loss, bilateral: Secondary | ICD-10-CM | POA: Diagnosis not present

## 2021-01-16 ENCOUNTER — Other Ambulatory Visit (INDEPENDENT_AMBULATORY_CARE_PROVIDER_SITE_OTHER): Payer: PPO

## 2021-01-16 ENCOUNTER — Other Ambulatory Visit: Payer: Self-pay

## 2021-01-16 DIAGNOSIS — E876 Hypokalemia: Secondary | ICD-10-CM | POA: Diagnosis not present

## 2021-01-16 LAB — POTASSIUM: Potassium: 3.9 mEq/L (ref 3.5–5.1)

## 2021-01-18 ENCOUNTER — Other Ambulatory Visit: Payer: Self-pay | Admitting: Internal Medicine

## 2021-01-18 DIAGNOSIS — E876 Hypokalemia: Secondary | ICD-10-CM

## 2021-01-18 NOTE — Progress Notes (Signed)
Order placed for f/u potassium check.  

## 2021-01-23 IMAGING — CR DG CHEST 2V
1 series · 2 of 2 positions shown · non-contrast
Comparison: 11/16/2015

CLINICAL DATA: Chest pain palpitation

EXAM:
CHEST - 2 VIEW

[Series 1: dg chest 2 view · 0.14mm/px · 2 of 2 slices shown]
[im 1/2]
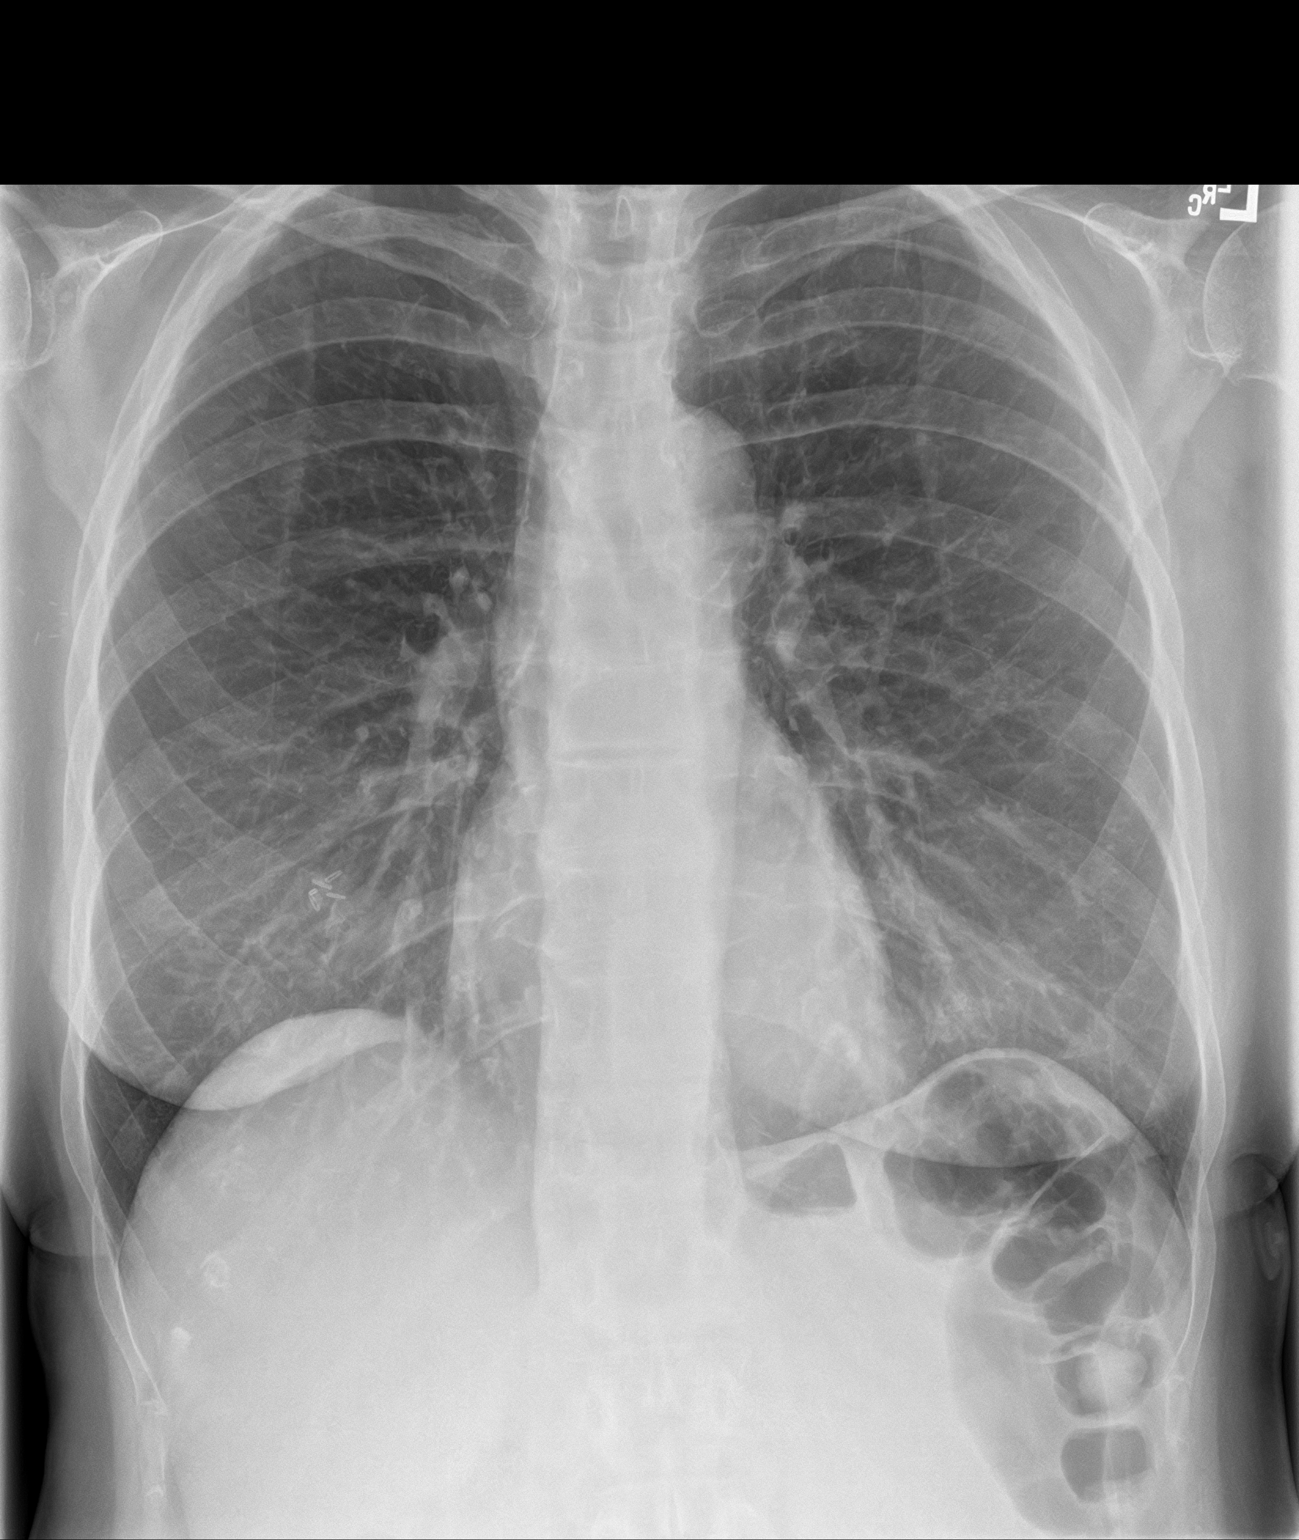
[im 2/2]
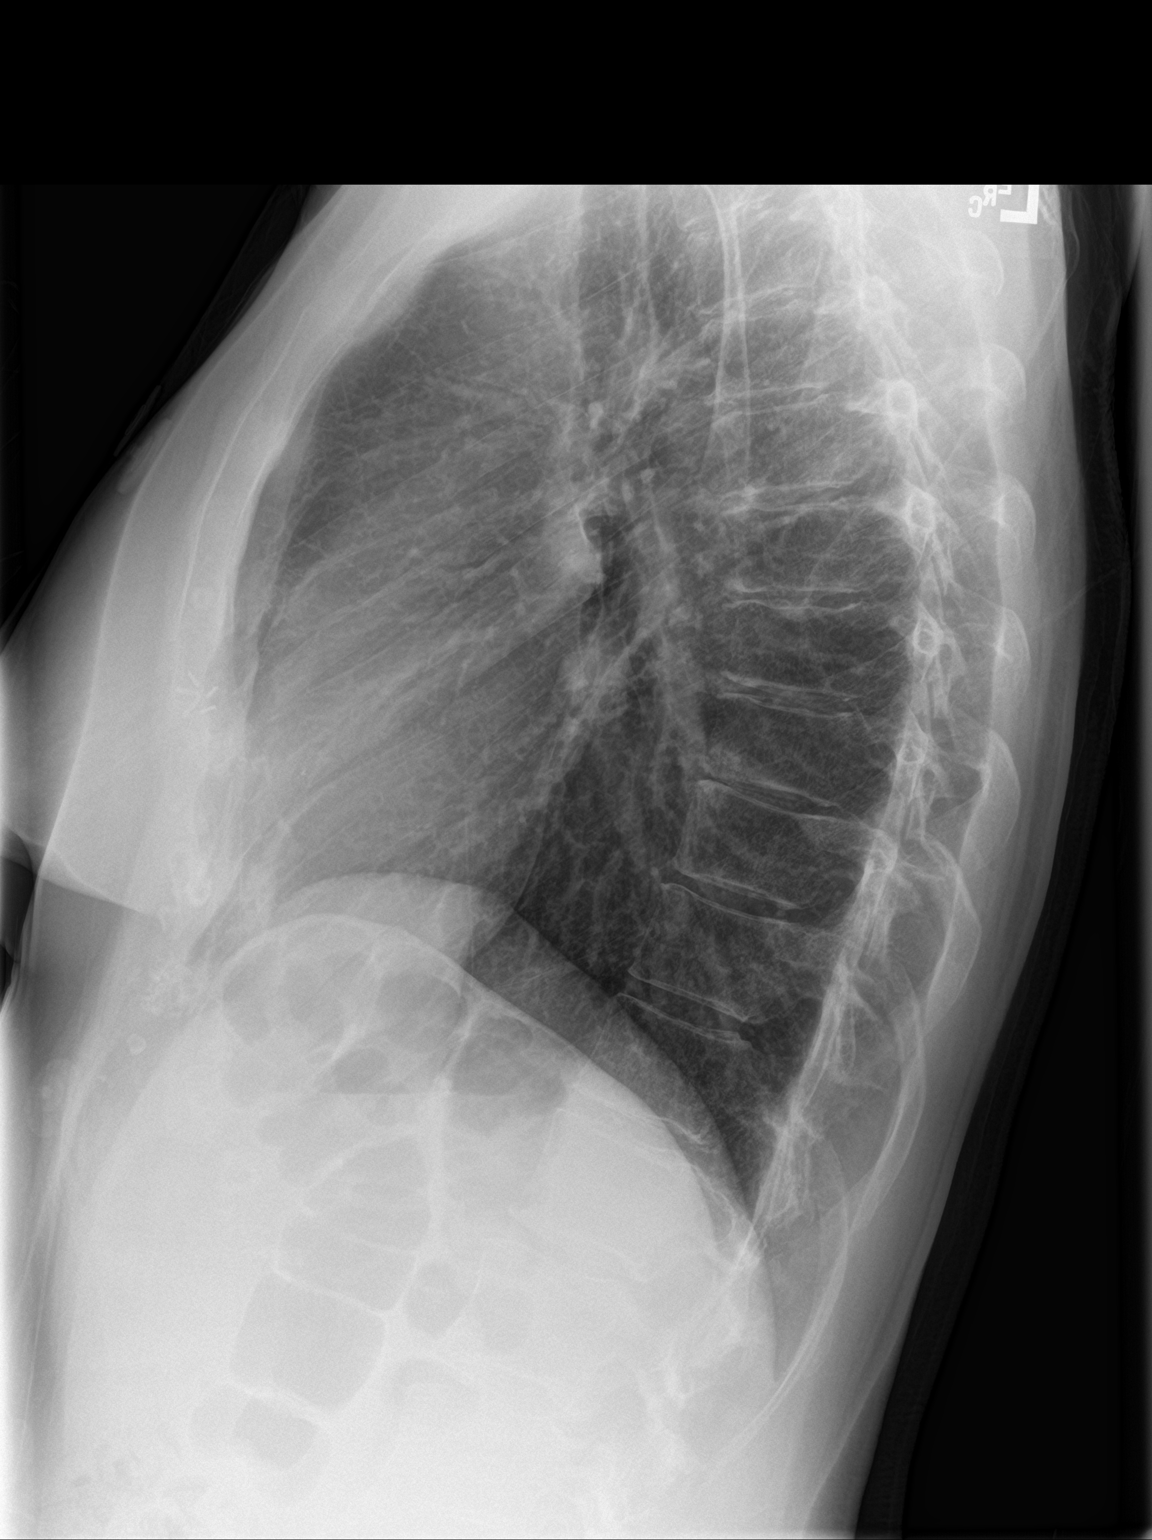

[2 of 2 positions shown; findings below may reference images not displayed]

FINDINGS: Clips over the right breast. No focal opacity or pleural effusion.
Normal cardiomediastinal silhouette. No pneumothorax.
IMPRESSION: No active cardiopulmonary disease.

## 2021-01-24 DIAGNOSIS — Z9189 Other specified personal risk factors, not elsewhere classified: Secondary | ICD-10-CM | POA: Diagnosis not present

## 2021-01-24 DIAGNOSIS — Z853 Personal history of malignant neoplasm of breast: Secondary | ICD-10-CM | POA: Diagnosis not present

## 2021-01-24 DIAGNOSIS — M8580 Other specified disorders of bone density and structure, unspecified site: Secondary | ICD-10-CM | POA: Diagnosis not present

## 2021-01-24 DIAGNOSIS — Z9221 Personal history of antineoplastic chemotherapy: Secondary | ICD-10-CM | POA: Diagnosis not present

## 2021-01-24 DIAGNOSIS — E876 Hypokalemia: Secondary | ICD-10-CM | POA: Diagnosis not present

## 2021-01-24 DIAGNOSIS — Z1231 Encounter for screening mammogram for malignant neoplasm of breast: Secondary | ICD-10-CM | POA: Diagnosis not present

## 2021-01-26 DIAGNOSIS — C44529 Squamous cell carcinoma of skin of other part of trunk: Secondary | ICD-10-CM | POA: Diagnosis not present

## 2021-01-26 DIAGNOSIS — C44612 Basal cell carcinoma of skin of right upper limb, including shoulder: Secondary | ICD-10-CM | POA: Diagnosis not present

## 2021-01-26 DIAGNOSIS — C44599 Other specified malignant neoplasm of skin of other part of trunk: Secondary | ICD-10-CM | POA: Diagnosis not present

## 2021-01-26 DIAGNOSIS — D485 Neoplasm of uncertain behavior of skin: Secondary | ICD-10-CM | POA: Diagnosis not present

## 2021-01-30 ENCOUNTER — Encounter: Payer: Self-pay | Admitting: Internal Medicine

## 2021-01-30 NOTE — Telephone Encounter (Signed)
If she is having acute back pain and this has been an intermittent problem, I do feel like an orthopedist evaluation is appropriate next step.  If acute problems, [facility] has an orthopedic walk in.  Let me know if any problems

## 2021-02-01 ENCOUNTER — Other Ambulatory Visit: Payer: Self-pay

## 2021-02-01 ENCOUNTER — Other Ambulatory Visit (INDEPENDENT_AMBULATORY_CARE_PROVIDER_SITE_OTHER): Payer: PPO

## 2021-02-01 DIAGNOSIS — E876 Hypokalemia: Secondary | ICD-10-CM | POA: Diagnosis not present

## 2021-02-01 LAB — POTASSIUM: Potassium: 3.7 mEq/L (ref 3.5–5.1)

## 2021-02-06 ENCOUNTER — Encounter: Payer: Self-pay | Admitting: Internal Medicine

## 2021-02-07 ENCOUNTER — Other Ambulatory Visit: Payer: Self-pay | Admitting: Internal Medicine

## 2021-02-07 ENCOUNTER — Encounter: Payer: Self-pay | Admitting: Internal Medicine

## 2021-02-07 NOTE — Telephone Encounter (Signed)
If having persistent problems with palpitations, increased heart rate, needs to be seen.  We can recheck potassium, etc as well.  Please schedule an appt.

## 2021-02-07 NOTE — Telephone Encounter (Signed)
rx sent in for fioricet #30 with no refills.   Note, error on previous rx message.  Hydralazine not sent in.  rx for lorazepam #30 with no refills - sent in.

## 2021-02-07 NOTE — Telephone Encounter (Signed)
rx sent in for hydralazine 25mg  #90 with one refill.

## 2021-02-09 NOTE — Telephone Encounter (Signed)
If she is having persistent intermittent palpitations, I would recommend scheduling an appt to further evaluate and discuss if any further w/up warranted.  Can do labs then as well.

## 2021-02-10 DIAGNOSIS — C44612 Basal cell carcinoma of skin of right upper limb, including shoulder: Secondary | ICD-10-CM | POA: Diagnosis not present

## 2021-02-10 NOTE — Telephone Encounter (Signed)
LMTCB. Needs appt with Dr Nicki Reaper. Please schedule if she calls back

## 2021-02-13 NOTE — Telephone Encounter (Signed)
Pt scheduled for tomorrow at 45

## 2021-02-14 ENCOUNTER — Encounter: Payer: Self-pay | Admitting: Internal Medicine

## 2021-02-14 ENCOUNTER — Other Ambulatory Visit: Payer: Self-pay

## 2021-02-14 ENCOUNTER — Ambulatory Visit (INDEPENDENT_AMBULATORY_CARE_PROVIDER_SITE_OTHER): Payer: PPO | Admitting: Internal Medicine

## 2021-02-14 VITALS — BP 110/70 | HR 70 | Temp 98.1°F | Resp 16 | Ht 68.0 in | Wt 149.6 lb

## 2021-02-14 DIAGNOSIS — R000 Tachycardia, unspecified: Secondary | ICD-10-CM | POA: Diagnosis not present

## 2021-02-14 DIAGNOSIS — R002 Palpitations: Secondary | ICD-10-CM | POA: Diagnosis not present

## 2021-02-14 DIAGNOSIS — E876 Hypokalemia: Secondary | ICD-10-CM

## 2021-02-14 LAB — CBC WITH DIFFERENTIAL/PLATELET
Basophils Absolute: 0.1 10*3/uL (ref 0.0–0.1)
Basophils Relative: 1.2 % (ref 0.0–3.0)
Eosinophils Absolute: 0.1 10*3/uL (ref 0.0–0.7)
Eosinophils Relative: 1.1 % (ref 0.0–5.0)
HCT: 40.1 % (ref 36.0–46.0)
Hemoglobin: 14 g/dL (ref 12.0–15.0)
Lymphocytes Relative: 19.3 % (ref 12.0–46.0)
Lymphs Abs: 1.8 10*3/uL (ref 0.7–4.0)
MCHC: 34.8 g/dL (ref 30.0–36.0)
MCV: 92.2 fl (ref 78.0–100.0)
Monocytes Absolute: 0.7 10*3/uL (ref 0.1–1.0)
Monocytes Relative: 7.9 % (ref 3.0–12.0)
Neutro Abs: 6.6 10*3/uL (ref 1.4–7.7)
Neutrophils Relative %: 70.5 % (ref 43.0–77.0)
Platelets: 285 10*3/uL (ref 150.0–400.0)
RBC: 4.35 Mil/uL (ref 3.87–5.11)
RDW: 12.6 % (ref 11.5–15.5)
WBC: 9.4 10*3/uL (ref 4.0–10.5)

## 2021-02-14 LAB — BASIC METABOLIC PANEL
BUN: 16 mg/dL (ref 6–23)
CO2: 30 mEq/L (ref 19–32)
Calcium: 10 mg/dL (ref 8.4–10.5)
Chloride: 102 mEq/L (ref 96–112)
Creatinine, Ser: 0.88 mg/dL (ref 0.40–1.20)
GFR: 67.37 mL/min (ref >60.00)
Glucose, Bld: 91 mg/dL (ref 70–99)
Potassium: 4.1 mEq/L (ref 3.5–5.1)
Sodium: 141 mEq/L (ref 135–145)

## 2021-02-14 LAB — MAGNESIUM: Magnesium: 2.4 mg/dL (ref 1.5–2.5)

## 2021-02-14 NOTE — Progress Notes (Signed)
ekg 

## 2021-02-14 NOTE — Progress Notes (Signed)
[name], female   DOB: 01/14/1951, 70 y.o.   MRN: [id number]   Subjective:    [name], female    DOB: September 09, 1951, 70 y.o.   MRN: [id number]  HPI This visit occurred during the SARS-CoV-2 public health emergency.  Safety protocols were in place, including screening questions prior to the visit, additional usage of staff PPE, and extensive cleaning of exam room while observing appropriate contact time as indicated for disinfecting solutions.  Patient here for work in appt.  Work in for increased heart rate and palpitations.  Was seen in ER 11/18/20 for increased heart rate/palpitations.  Found to have hypokalemia.  Symptoms resolved.  Was doing well.  Noticed 02/06/21 - noticed "racing heart episodes".  No chest pain associated.  States had extra coffee.  Possibly could have triggered.  Highest reading in low 100s.  Oxygen ok.  No chest pain or sob.  Tries to stay active.  No acid reflux noted.  No abdominal pain reported.  Bowels moving.  Had squamous cell removed right anterior chest.  Planning for Moh's surgery.  Recently received hearing aids.    Past Medical History:  Diagnosis Date  . Basal cell carcinoma    multiple removed/[facility]  . Breast cancer ([place]) 12/13   s/p right lumpectomy, s/p chemo and XRT  . DES exposure in utero   . Hepatitis A   . History of chicken pox   . Hyperactive airway disease   . 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 Migraines   . Miscarriage    x3   Past Surgical History:  Procedure Laterality Date  . BASAL CELL CARCINOMA EXCISION     Multiple  . BREAST BIOPSY  1975   Cyst  . BREAST LUMPECTOMY WITH AXILLARY LYMPH NODE DISSECTION  01/06/13, 01/22/13   Dr. Drue Dun, [facility], second surgery for pos margin  . CERVICAL FUSION  2001  . COLONOSCOPY    . DILATION AND CURETTAGE OF UTERUS     x 3  . LAPAROSCOPIC BILATERAL SALPINGO OOPHERECTOMY Bilateral 12/21/2020   Procedure: LAPAROSCOPIC BILATERAL SALPINGO  OOPHORECTOMY;  Surgeon: Ward, Honor Loh, MD;  Location: [facility] ORS;  Service: Gynecology;  Laterality: Bilateral;  . VAGINAL DELIVERY     x4  . WISDOM TOOTH EXTRACTION     Family History  Problem Relation Age of Onset  . Pancreatic cancer Mother   . Cancer Mother        Pancreatic  . Arthritis Mother   . Hyperlipidemia Mother   . Hypertension Mother   . Heart attack Father   . Hyperlipidemia Father   . Hypertension Father   . Heart disease Father 9  . Colon cancer Other   . Cancer Other 50       Breast and Ovarian- dx 50's/Colon  . Birth defects Maternal Aunt        Breast - in 79's  . Stroke Maternal Grandmother   . Birth defects Maternal Aunt        Breast - in 68's   Social History   Socioeconomic History  . Marital status: Married    Spouse name: ED  . Number of children: 4  . Years of education: Not on file  . Highest education level: Not on file  Occupational History  . Occupation: Corporate treasurer - [employer]  Tobacco Use  . Smoking status: Never Smoker  . Smokeless tobacco: Never Used  Substance and Sexual Activity  . Alcohol  use: Yes    Alcohol/week: 0.0 standard drinks    Comment: 3 times a week  . Drug use: No  . Sexual activity: Not on file  Other Topics Concern  . Not on file  Social History Narrative   Lives in [place] with husband.  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    Social Determinants of Health   Financial Resource Strain: Not on file  Food Insecurity: Not on file  Transportation Needs: Not on file  Physical Activity: Not on file  Stress: Not on file  Social Connections: Not on file    Outpatient Encounter Medications as of 02/14/2021  Medication Sig  . albuterol (VENTOLIN HFA) 108 (90 Base) MCG/ACT inhaler Inhale 2 puffs into the lungs every 6 (six) hours as needed for wheezing or shortness of breath.  Marland Kitchen BIOTIN PO Take 10,000 mg by mouth daily.   . butalbital-aspirin-caffeine (FIORINAL) 50-325-40 MG capsule TAKE 1 CAPSULE BY MOUTH TWICE  DAILY AS NEEDED FOR HEADACHE  . Cholecalciferol (VITAMIN D3) 125 MCG (5000 UT) CAPS Take 5,000 Units by mouth daily.  . Glucosamine-Chondroitin (MOVE FREE PO) Take 1 capsule by mouth daily. Cartilage/boron/hyalauronic acid  . hydrOXYzine (ATARAX/VISTARIL) 10 MG tablet TAKE ONE TABLET BY MOUTH EVERY DAY AS NEEDED FOR ITCHING (Patient taking differently: Take 10 mg by mouth daily as needed for itching. TAKE ONE TABLET BY MOUTH EVERY DAY AS NEEDED FOR ITCHING)  . ipratropium (ATROVENT) 0.06 % nasal spray SPRAY TWICE INTO EACH NOSTRIL TWICE DAILY (Patient taking differently: Place 2 sprays into both nostrils in the morning and at bedtime. SPRAY TWICE INTO EACH NOSTRIL TWICE DAILY)  . LORazepam (ATIVAN) 0.5 MG tablet TAKE ONE TABLET EVERY DAY AS NEEDED FOR ANXIETY  . MAGNESIUM BISGLYCINATE PO Take 200 mg by mouth daily.  . Multiple Minerals-Vitamins (CALCIUM CITRATE PLUS/MAGNESIUM PO) Take 2 tablets by mouth 2 (two) times daily. CAL CITRATE 500-MAGNESIUM 80-ZINC-10  . Multiple Vitamins-Minerals (MULTIVITAMIN WOMENS 50+ ADV PO) Take 1 tablet by mouth daily.  . Omega-3 Fatty Acids (OMEGA-3 FISH OIL PO) Take 1,280 mg by mouth 2 (two) times daily.  . pantoprazole (PROTONIX) 20 MG tablet TAKE ONE TABLET BY MOUTH TWICE DAILY BEFORE A MEAL  . propranolol (INDERAL) 40 MG tablet TAKE 1 TABLET BY MOUTH DAILY (Patient taking differently: Take 40 mg by mouth daily.)  . Riboflavin (VITAMIN B2 PO) Take 250 mg by mouth daily.  . TURMERIC CURCUMIN PO Take 2,250 mg by mouth in the morning and at bedtime.  Marland Kitchen zolpidem (AMBIEN) 5 MG tablet TAKE 1 TABLET AT BEDTIME AS NEEDED (Patient taking differently: Take 5 mg by mouth at bedtime as needed for sleep.)  . [DISCONTINUED] oxyCODONE (ROXICODONE) 5 MG immediate release tablet Take 1 tablet (5 mg total) by mouth every 4 (four) hours as needed.  . [DISCONTINUED] Potassium Chloride ER 20 MEQ TBCR Take 20 mEq by mouth daily. (Patient taking differently: Take 20 mEq by mouth every  other day.)   No facility-administered encounter medications on file as of 02/14/2021.    Review of Systems  Constitutional: Negative for appetite change and unexpected weight change.  HENT: Negative for congestion and sinus pressure.   Respiratory: Negative for cough, chest tightness and shortness of breath.   Cardiovascular: Positive for palpitations. Negative for chest pain and leg swelling.  Gastrointestinal: Negative for abdominal pain, diarrhea, nausea and vomiting.  Genitourinary: Negative for difficulty urinating and dysuria.  Musculoskeletal: Negative for joint swelling and myalgias.  Skin: Negative for color change and rash.  Neurological:  Negative for dizziness, light-headedness and headaches.  Psychiatric/Behavioral: Negative for agitation and dysphoric mood.       Objective:    Physical Exam Vitals reviewed.  Constitutional:      General: She is not in acute distress.    Appearance: Normal appearance.  HENT:     Head: Normocephalic and atraumatic.     Right Ear: External ear normal.     Left Ear: External ear normal.     Mouth/Throat:     Mouth: Oropharynx is clear and moist.  Eyes:     General: No scleral icterus.       Right eye: No discharge.        Left eye: No discharge.  Neck:     Thyroid: No thyromegaly.  Cardiovascular:     Rate and Rhythm: Normal rate and regular rhythm.  Pulmonary:     Effort: No respiratory distress.     Breath sounds: Normal breath sounds. No wheezing.  Abdominal:     General: Bowel sounds are normal.     Palpations: Abdomen is soft.     Tenderness: There is no abdominal tenderness.  Musculoskeletal:        General: No swelling, tenderness or edema.     Cervical back: Neck supple. No tenderness.  Lymphadenopathy:     Cervical: No cervical adenopathy.  Skin:    Findings: No erythema or rash.  Neurological:     Mental Status: She is alert.  Psychiatric:        Mood and Affect: Mood normal.        Behavior: Behavior  normal.     BP 110/70   Pulse 70   Temp 98.1 F (36.7 C) (Oral)   Resp 16   Ht 5\' 8"  (1.727 m)   Wt 149 lb 9.6 oz (67.9 kg)   SpO2 97%   BMI 22.75 kg/m  Wt Readings from Last 3 Encounters:  02/14/21 149 lb 9.6 oz (67.9 kg)  12/21/20 147 lb (66.7 kg)  11/22/20 147 lb 3.2 oz (66.8 kg)     Lab Results  Component Value Date   WBC 9.4 02/14/2021   HGB 14.0 02/14/2021   HCT 40.1 02/14/2021   PLT 285.0 02/14/2021   GLUCOSE 91 02/14/2021   CHOL 229 (H) 09/13/2020   TRIG 78.0 09/13/2020   HDL 76.30 09/13/2020   LDLCALC 138 (H) 09/13/2020   ALT 19 09/13/2020   AST 21 09/13/2020   NA 141 02/14/2021   K 4.1 02/14/2021   CL 102 02/14/2021   CREATININE 0.88 02/14/2021   BUN 16 02/14/2021   CO2 30 02/14/2021   TSH 3.28 11/30/2020   MICROALBUR <0.7 11/14/2015    No results found.     Assessment & Plan:   Problem List Items Addressed This Visit    Hypokalemia    Off potassium.  Last check wnl.  Recheck potassium level today.       Increased heart rate    Increased heart rate as outlined.  Recheck potassium today.  EKG  - SR with no acute ischemic changes.  Discussed cardiology referral. She is in agreement.  Question of need for monitor, echo, etc.  Check magnesium level.        Relevant Orders   Ambulatory referral to Cardiology   Palpitations - Primary    Increased heart rate and palpitations as outlined.  Seen previously in ER.  Found to have low potassium.  This has been replaced.  Recheck today.  EKG -  SR with no acute ischemic changes.  Discussed further evaluation.  She is agreeable for cardiology referral - question of need for zio monitor, echo, etc.  Avoid increased caffeine, stimulants, etc.        Relevant Orders   EKG 12-Lead (Completed)   CBC with Differential/Platelet (Completed)   Basic metabolic panel (Completed)   Magnesium (Completed)   Ambulatory referral to Cardiology       Einar Pheasant, MD

## 2021-02-19 ENCOUNTER — Encounter: Payer: Self-pay | Admitting: Internal Medicine

## 2021-02-19 NOTE — Assessment & Plan Note (Signed)
Increased heart rate as outlined.  Recheck potassium today.  EKG  - SR with no acute ischemic changes.  Discussed cardiology referral. She is in agreement.  Question of need for monitor, echo, etc.  Check magnesium level.

## 2021-02-19 NOTE — Assessment & Plan Note (Signed)
Off potassium.  Last check wnl.  Recheck potassium level today.

## 2021-02-19 NOTE — Assessment & Plan Note (Signed)
Increased heart rate and palpitations as outlined.  Seen previously in ER.  Found to have low potassium.  This has been replaced.  Recheck today.  EKG - SR with no acute ischemic changes.  Discussed further evaluation.  She is agreeable for cardiology referral - question of need for zio monitor, echo, etc.  Avoid increased caffeine, stimulants, etc.

## 2021-02-24 ENCOUNTER — Ambulatory Visit (INDEPENDENT_AMBULATORY_CARE_PROVIDER_SITE_OTHER): Payer: PPO

## 2021-02-24 ENCOUNTER — Encounter: Payer: Self-pay | Admitting: Cardiology

## 2021-02-24 ENCOUNTER — Ambulatory Visit (INDEPENDENT_AMBULATORY_CARE_PROVIDER_SITE_OTHER): Payer: PPO | Admitting: Cardiology

## 2021-02-24 ENCOUNTER — Other Ambulatory Visit: Payer: Self-pay

## 2021-02-24 VITALS — BP 130/66 | HR 82 | Ht 68.5 in | Wt 148.0 lb

## 2021-02-24 DIAGNOSIS — E7800 Pure hypercholesterolemia, unspecified: Secondary | ICD-10-CM | POA: Diagnosis not present

## 2021-02-24 DIAGNOSIS — R002 Palpitations: Secondary | ICD-10-CM | POA: Diagnosis not present

## 2021-02-24 NOTE — Progress Notes (Signed)
Cardiology Office Note:    Date:  02/24/2021   ID:  Cindy Arnold, DOB Dec 27, 1950, MRN [id number]  PCP:  Einar Pheasant, [place]  Cardiologist:  Kate Sable, MD  Advanced Practice Provider:  No care team member to display Electrophysiologist:  None       Referring MD: Einar Pheasant, MD   Chief Complaint  Patient presents with  . New Patient (Initial Visit)    Referred by PCP for palpitations. Meds reviewed verbally with patient.    Cindy Arnold is a 70 y.o. female who is being seen today for the evaluation of palpitations at the request of Einar Pheasant, MD.   History of Present Illness:    Cindy Arnold is a 70 y.o. female with a hx of breast cancer status post lumpectomy- chemo+ XRT, migraines who presents due to palpitations.  Patient states having palpitations after Thanksgiving D 2021.  This was roughly 4 months ago.  Under time, she felt her heart racing in the evening, prompting her to go to the emergency room.  Found to be hypokalemic with potassium levels of 2.8.  She received potassium replacement, eventually placed on p.o.  Etiology for hypokalemia unknown.  Eventually she was taken off potassium and last levels obtained 2 weeks ago were normal.  She states having another episode of palpitations on Christmas Eve with her heart race for about an hour.  Checked her heart monitor with heart rates of 90s to 110 bpm.  Last episode of flutters was yesterday evening which lasted roughly an hour.  She did not check her heart rate this instance.  She denies chest pain, shortness of breath.  States being fairly active.  Denies any history of heart disease.  Father passed from heart attack in his 30s.  She has a history of ophthalmic migraine for which she takes propranolol 40 mg daily.  Past Medical History:  Diagnosis Date  . Basal cell carcinoma    multiple removed/[facility]  . Breast cancer ([place]) 12/13   s/p right  lumpectomy, s/p chemo and XRT  . DES exposure in utero   . Hepatitis A   . History of chicken pox   . Hyperactive airway disease   . 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 Migraines   . Miscarriage    x3    Past Surgical History:  Procedure Laterality Date  . BASAL CELL CARCINOMA EXCISION     Multiple  . BREAST BIOPSY  1975   Cyst  . BREAST LUMPECTOMY WITH AXILLARY LYMPH NODE DISSECTION  01/06/13, 01/22/13   Dr. Drue Dun, [facility], second surgery for pos margin  . CERVICAL FUSION  2001  . COLONOSCOPY    . DILATION AND CURETTAGE OF UTERUS     x 3  . LAPAROSCOPIC BILATERAL SALPINGO OOPHERECTOMY Bilateral 12/21/2020   Procedure: LAPAROSCOPIC BILATERAL SALPINGO OOPHORECTOMY;  Surgeon: Ward, Honor Loh, MD;  Location: [facility] ORS;  Service: Gynecology;  Laterality: Bilateral;  . VAGINAL DELIVERY     x4  . WISDOM TOOTH EXTRACTION      Current Medications: Current Meds  Medication Sig  . albuterol (VENTOLIN HFA) 108 (90 Base) MCG/ACT inhaler Inhale 2 puffs into the lungs every 6 (six) hours as needed for wheezing or shortness of breath.  Marland Kitchen BIOTIN PO Take 10,000 mg by mouth daily.   . butalbital-aspirin-caffeine (FIORINAL) 50-325-40 MG capsule TAKE 1 CAPSULE BY MOUTH TWICE DAILY AS NEEDED FOR HEADACHE  . Cholecalciferol (VITAMIN  D3) 125 MCG (5000 UT) CAPS Take 5,000 Units by mouth daily.  . Glucosamine-Chondroitin (MOVE FREE PO) Take 1 capsule by mouth daily. Cartilage/boron/hyalauronic acid  . hydrOXYzine (ATARAX/VISTARIL) 10 MG tablet TAKE ONE TABLET BY MOUTH EVERY DAY AS NEEDED FOR ITCHING  . ipratropium (ATROVENT) 0.06 % nasal spray SPRAY TWICE INTO EACH NOSTRIL TWICE DAILY  . LORazepam (ATIVAN) 0.5 MG tablet TAKE ONE TABLET EVERY DAY AS NEEDED FOR ANXIETY  . MAGNESIUM BISGLYCINATE PO Take 200 mg by mouth daily.  . Multiple Minerals-Vitamins (CALCIUM CITRATE PLUS/MAGNESIUM PO) Take 2 tablets by mouth 2 (two) times daily. CAL CITRATE 500-MAGNESIUM 80-ZINC-10  . Multiple Vitamins-Minerals  (MULTIVITAMIN WOMENS 50+ ADV PO) Take 1 tablet by mouth daily.  . Omega-3 Fatty Acids (OMEGA-3 FISH OIL PO) Take 1,280 mg by mouth 2 (two) times daily.  . pantoprazole (PROTONIX) 20 MG tablet TAKE ONE TABLET BY MOUTH TWICE DAILY BEFORE A MEAL  . propranolol (INDERAL) 40 MG tablet TAKE 1 TABLET BY MOUTH DAILY  . Riboflavin (VITAMIN B2 PO) Take 250 mg by mouth daily.  . TURMERIC CURCUMIN PO Take 2,250 mg by mouth in the morning and at bedtime.  Marland Kitchen zolpidem (AMBIEN) 5 MG tablet TAKE 1 TABLET AT BEDTIME AS NEEDED     Allergies:   Patient has no known allergies.   Social History   Socioeconomic History  . Marital status: Married    Spouse name: ED  . Number of children: 4  . Years of education: Not on file  . Highest education level: Not on file  Occupational History  . Occupation: Corporate treasurer - [employer]  Tobacco Use  . Smoking status: Never Smoker  . Smokeless tobacco: Never Used  Substance and Sexual Activity  . Alcohol use: Yes    Alcohol/week: 0.0 standard drinks    Comment: 3 times a week  . Drug use: No  . Sexual activity: Not on file  Other Topics Concern  . Not on file  Social History Narrative   Lives in [place] with husband.  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    Social Determinants of Health   Financial Resource Strain: Not on file  Food Insecurity: Not on file  Transportation Needs: Not on file  Physical Activity: Not on file  Stress: Not on file  Social Connections: Not on file     Family History: The patient's family history includes Arthritis in her mother; Birth defects in her maternal aunt and maternal aunt; Cancer in her mother; Cancer (age of onset: 19) in an other family member; Colon cancer in an other family member; Heart attack in her father; Heart disease (age of onset: 44) in her father; Hyperlipidemia in her father and mother; Hypertension in her father and mother; Pancreatic cancer in her mother; Stroke in her maternal grandmother.  ROS:   Please  see the history of present illness.     All other systems reviewed and are negative.  EKGs/Labs/Other Studies Reviewed:    The following studies were reviewed today:   EKG:  EKG is  ordered today.  The ekg ordered today demonstrates normal sinus rhythm, heart rate 82  Recent Labs: 09/13/2020: ALT 19 11/30/2020: TSH 3.28 02/14/2021: BUN 16; Creatinine, Ser 0.88; Hemoglobin 14.0; Magnesium 2.4; Platelets 285.0; Potassium 4.1; Sodium 141  Recent Lipid Panel    Component Value Date/Time   CHOL 229 (H) 09/13/2020 0759   TRIG 78.0 09/13/2020 0759   HDL 76.30 09/13/2020 0759   CHOLHDL 3 09/13/2020 0759   VLDL 15.6  09/13/2020 0759   LDLCALC 138 (H) 09/13/2020 0759     Risk Assessment/Calculations:      Physical Exam:    VS:  BP 130/66 (BP Location: Left Arm, Patient Position: Sitting, Cuff Size: Normal)   Pulse 82   Ht 5' 8.5" (1.74 m)   Wt 148 lb (67.1 kg)   SpO2 99%   BMI 22.18 kg/m     Wt Readings from Last 3 Encounters:  02/24/21 148 lb (67.1 kg)  02/14/21 149 lb 9.6 oz (67.9 kg)  12/21/20 147 lb (66.7 kg)     GEN:  Well nourished, well developed in no acute distress HEENT: Normal NECK: No JVD; No carotid bruits LYMPHATICS: No lymphadenopathy CARDIAC: RRR, no murmurs, rubs, gallops RESPIRATORY:  Clear to auscultation without rales, wheezing or rhonchi  ABDOMEN: Soft, non-tender, non-distended MUSCULOSKELETAL:  No edema; No deformity  SKIN: Warm and dry NEUROLOGIC:  Alert and oriented x 3 PSYCHIATRIC:  Normal affect   ASSESSMENT:    1. Palpitations   2. Pure hypercholesterolemia    PLAN:    In order of problems listed above:  1. Palpitations, will place 2-week cardiac monitor to evaluate for any significant arrhythmias.   2. Hyperlipidemia, 10-year ASCVD 14.3%.  Previous lipid panel normal.  Repeat fasting lipid panel planned in 2 weeks.  Low-cholesterol diet advised.  If repeat fasting lipid profile shows persistently abnormal lipid levels, recommend  statin.   Follow-up after cardiac monitor       Medication Adjustments/Labs and Tests Ordered: Current medicines are reviewed at length with the patient today.  Concerns regarding medicines are outlined above.  Orders Placed This Encounter  Procedures  . LONG TERM MONITOR (3-14 DAYS)  . EKG 12-Lead   No orders of the defined types were placed in this encounter.   Patient Instructions   Medication Instructions:  Your physician recommends that you continue on your current medications as directed. Please refer to the Current Medication list given to you today.  *If you need a refill on your cardiac medications before your next appointment, please call your pharmacy*   Lab Work: None ordered If you have labs (blood work) drawn today and your tests are completely normal, you will receive your results only by: Marland Kitchen MyChart Message (if you have MyChart) OR . A paper copy in the mail If you have any lab test that is abnormal or we need to change your treatment, we will call you to review the results.   Testing/Procedures:  Your physician has recommended that you wear a Zio monitor for 2 weeks. This monitor is a medical device that records the heart's electrical activity. Doctors most often use these monitors to diagnose arrhythmias. Arrhythmias are problems with the speed or rhythm of the heartbeat. The monitor is a small device applied to your chest. You can wear one while you do your normal daily activities. While wearing this monitor if you have any symptoms to push the button and record what you felt. Once you have worn this monitor for the period of time provider prescribed (Usually 14 days), you will return the monitor device in the postage paid box. Once it is returned they will download the data collected and provide [place] with a report which the provider will then review and we will call you with those results. Important tips:  1. Avoid showering during the first 24 hours of wearing  the monitor. 2. Avoid excessive sweating to help maximize wear time. 3. Do not submerge the device,  no hot tubs, and no swimming pools. 4. Keep any lotions or oils away from the patch. 5. After 24 hours you may shower with the patch on. Take brief showers with your back facing the shower head.  6. Do not remove patch once it has been placed because that will interrupt data and decrease adhesive wear time. 7. Push the button when you have any symptoms and write down what you were feeling. 8. Once you have completed wearing your monitor, remove and place into box which has postage paid and place in your outgoing mailbox.  9. If for some reason you have misplaced your box then call our office and we can provide another box and/or mail it off for you.         Follow-Up: At [facility], you and your health needs are our priority.  As part of our continuing mission to provide you with exceptional heart care, we have created designated Provider Care Teams.  These Care Teams include your primary Cardiologist (physician) and Advanced Practice Providers (APPs -  Physician Assistants and Nurse Practitioners) who all work together to provide you with the care you need, when you need it.  We recommend signing up for the patient portal called "MyChart".  Sign up information is provided on this After Visit Summary.  MyChart is used to connect with patients for Virtual Visits (Telemedicine).  Patients are able to view lab/test results, encounter notes, upcoming appointments, etc.  Non-urgent messages can be sent to your provider as well.   To learn more about what you can do with MyChart, go to [email or web address].    Your next appointment:   6 week(s)  The format for your next appointment:   In Person  Provider:   Kate Sable, MD   Other Instructions   Preventing High Cholesterol Cholesterol is a white, waxy substance similar to fat that the human body needs to help build cells.  The liver makes all the cholesterol that a person's body needs. Having high cholesterol (hypercholesterolemia) increases your risk for heart disease and stroke. Extra or excess cholesterol comes from the food that you eat. High cholesterol can often be prevented with diet and lifestyle changes. If you already have high cholesterol, you can control it with diet, lifestyle changes, and medicines. How can high cholesterol affect me? If you have high cholesterol, fatty deposits (plaques) may build up on the walls of your blood vessels. The blood vessels that carry blood away from your heart are called arteries. Plaques make the arteries narrower and stiffer. This in turn can:  Restrict or block blood flow and cause blood clots to form.  Increase your risk for heart attack and stroke. What can increase my risk for high cholesterol? This condition is more likely to develop in people who:  Eat foods that are high in saturated fat or cholesterol. Saturated fat is mostly found in foods that come from animal sources.  Are overweight.  Are not getting enough exercise.  Have a family history of high cholesterol (familial hypercholesterolemia). What actions can I take to prevent this? Nutrition  Eat less saturated fat.  Avoid trans fats (partially hydrogenated oils). These are often found in margarine and in some baked goods, fried foods, and snacks bought in packages.  Avoid precooked or cured meat, such as bacon, sausages, or meat loaves.  Avoid foods and drinks that have added sugars.  Eat more fruits, vegetables, and whole grains.  Choose healthy sources of protein, such as fish,  poultry, lean cuts of red meat, beans, peas, lentils, and nuts.  Choose healthy sources of fat, such as: ? Nuts. ? Vegetable oils, especially olive oil. ? Fish that have healthy fats, such as omega-3 fatty acids. These fish include mackerel or salmon.   Lifestyle  Lose weight if you are overweight. Maintaining  a healthy body mass index (BMI) can help prevent or control high cholesterol. It can also lower your risk for diabetes and high blood pressure. Ask your health care provider to help you with a diet and exercise plan to lose weight safely.  Do not use any products that contain nicotine or tobacco, such as cigarettes, e-cigarettes, and chewing tobacco. If you need help quitting, ask your health care provider. Alcohol use  Do not drink alcohol if: ? Your health care provider tells you not to drink. ? You are pregnant, may be pregnant, or are planning to become pregnant.  If you drink alcohol: ? Limit how much you use to:  0-1 drink a day for women.  0-2 drinks a day for men. ? Be aware of how much alcohol is in your drink. In the [place] one drink equals one 12 oz bottle of beer (355 mL), one 5 oz glass of wine (148 mL), or one 1 oz glass of hard liquor (44 mL). Activity  Get enough exercise. Do exercises as told by your health care provider.  Each week, do at least 150 minutes of exercise that takes a medium level of effort (moderate-intensity exercise). This kind of exercise: ? Makes your heart beat faster while allowing you to still be able to talk. ? Can be done in short sessions several times a day or longer sessions a few times a week. For example, on 5 days each week, you could walk fast or ride your bike 3 times a day for 10 minutes each time.   Medicines  Your health care provider may recommend medicines to help lower cholesterol. This may be a medicine to lower the amount of cholesterol that your liver makes. You may need medicine if: ? Diet and lifestyle changes have not lowered your cholesterol enough. ? You have high cholesterol and other risk factors for heart disease or stroke.  Take over-the-counter and prescription medicines only as told by your health care provider. General information  Manage your risk factors for high cholesterol. Talk with your health care provider  about all your risk factors and how to lower your risk.  Manage other conditions that you have, such as diabetes or high blood pressure (hypertension).  Have blood tests to check your cholesterol levels at regular points in time as told by your health care provider.  Keep all follow-up visits as told by your health care provider. This is important. Where to find more information  American Heart Association: www.heart.org  National Heart, Lung, and Blood Institute: https://wilson-eaton.com/ Summary  High cholesterol increases your risk for heart disease and stroke. By keeping your cholesterol level low, you can reduce your risk for these conditions.  High cholesterol can often be prevented with diet and lifestyle changes.  Work with your health care provider to manage your risk factors, and have your blood tested regularly. This information is not intended to replace advice given to you by your health care provider. Make sure you discuss any questions you have with your health care provider. Document Revised: 09/22/2019 Document Reviewed: 09/22/2019 Elsevier Patient Education  2021 Elsevier Inc.     Signed, Kate Sable, MD  02/24/2021 12:59 PM    [facility]

## 2021-02-24 NOTE — Patient Instructions (Addendum)
Medication Instructions:  Your physician recommends that you continue on your current medications as directed. Please refer to the Current Medication list given to you today.  *If you need a refill on your cardiac medications before your next appointment, please call your pharmacy*   Lab Work: None ordered If you have labs (blood work) drawn today and your tests are completely normal, you will receive your results only by: Marland Kitchen MyChart Message (if you have MyChart) OR . A paper copy in the mail If you have any lab test that is abnormal or we need to change your treatment, we will call you to review the results.   Testing/Procedures:  Your physician has recommended that you wear a Zio monitor for 2 weeks. This monitor is a medical device that records the heart's electrical activity. Doctors most often use these monitors to diagnose arrhythmias. Arrhythmias are problems with the speed or rhythm of the heartbeat. The monitor is a small device applied to your chest. You can wear one while you do your normal daily activities. While wearing this monitor if you have any symptoms to push the button and record what you felt. Once you have worn this monitor for the period of time provider prescribed (Usually 14 days), you will return the monitor device in the postage paid box. Once it is returned they will download the data collected and provide [place] with a report which the provider will then review and we will call you with those results. Important tips:  1. Avoid showering during the first 24 hours of wearing the monitor. 2. Avoid excessive sweating to help maximize wear time. 3. Do not submerge the device, no hot tubs, and no swimming pools. 4. Keep any lotions or oils away from the patch. 5. After 24 hours you may shower with the patch on. Take brief showers with your back facing the shower head.  6. Do not remove patch once it has been placed because that will interrupt data and decrease adhesive wear  time. 7. Push the button when you have any symptoms and write down what you were feeling. 8. Once you have completed wearing your monitor, remove and place into box which has postage paid and place in your outgoing mailbox.  9. If for some reason you have misplaced your box then call our office and we can provide another box and/or mail it off for you.         Follow-Up: At [facility], you and your health needs are our priority.  As part of our continuing mission to provide you with exceptional heart care, we have created designated Provider Care Teams.  These Care Teams include your primary Cardiologist (physician) and Advanced Practice Providers (APPs -  Physician Assistants and Nurse Practitioners) who all work together to provide you with the care you need, when you need it.  We recommend signing up for the patient portal called "MyChart".  Sign up information is provided on this After Visit Summary.  MyChart is used to connect with patients for Virtual Visits (Telemedicine).  Patients are able to view lab/test results, encounter notes, upcoming appointments, etc.  Non-urgent messages can be sent to your provider as well.   To learn more about what you can do with MyChart, go to [email or web address].    Your next appointment:   6 week(s)  The format for your next appointment:   In Person  Provider:   Kate Sable, MD   Other Instructions   Preventing High Cholesterol Cholesterol  is a white, waxy substance similar to fat that the human body needs to help build cells. The liver makes all the cholesterol that a person's body needs. Having high cholesterol (hypercholesterolemia) increases your risk for heart disease and stroke. Extra or excess cholesterol comes from the food that you eat. High cholesterol can often be prevented with diet and lifestyle changes. If you already have high cholesterol, you can control it with diet, lifestyle changes, and medicines. How can  high cholesterol affect me? If you have high cholesterol, fatty deposits (plaques) may build up on the walls of your blood vessels. The blood vessels that carry blood away from your heart are called arteries. Plaques make the arteries narrower and stiffer. This in turn can:  Restrict or block blood flow and cause blood clots to form.  Increase your risk for heart attack and stroke. What can increase my risk for high cholesterol? This condition is more likely to develop in people who:  Eat foods that are high in saturated fat or cholesterol. Saturated fat is mostly found in foods that come from animal sources.  Are overweight.  Are not getting enough exercise.  Have a family history of high cholesterol (familial hypercholesterolemia). What actions can I take to prevent this? Nutrition  Eat less saturated fat.  Avoid trans fats (partially hydrogenated oils). These are often found in margarine and in some baked goods, fried foods, and snacks bought in packages.  Avoid precooked or cured meat, such as bacon, sausages, or meat loaves.  Avoid foods and drinks that have added sugars.  Eat more fruits, vegetables, and whole grains.  Choose healthy sources of protein, such as fish, poultry, lean cuts of red meat, beans, peas, lentils, and nuts.  Choose healthy sources of fat, such as: ? Nuts. ? Vegetable oils, especially olive oil. ? Fish that have healthy fats, such as omega-3 fatty acids. These fish include mackerel or salmon.   Lifestyle  Lose weight if you are overweight. Maintaining a healthy body mass index (BMI) can help prevent or control high cholesterol. It can also lower your risk for diabetes and high blood pressure. Ask your health care provider to help you with a diet and exercise plan to lose weight safely.  Do not use any products that contain nicotine or tobacco, such as cigarettes, e-cigarettes, and chewing tobacco. If you need help quitting, ask your health care  provider. Alcohol use  Do not drink alcohol if: ? Your health care provider tells you not to drink. ? You are pregnant, may be pregnant, or are planning to become pregnant.  If you drink alcohol: ? Limit how much you use to:  0-1 drink a day for women.  0-2 drinks a day for men. ? Be aware of how much alcohol is in your drink. In the [place] one drink equals one 12 oz bottle of beer (355 mL), one 5 oz glass of wine (148 mL), or one 1 oz glass of hard liquor (44 mL). Activity  Get enough exercise. Do exercises as told by your health care provider.  Each week, do at least 150 minutes of exercise that takes a medium level of effort (moderate-intensity exercise). This kind of exercise: ? Makes your heart beat faster while allowing you to still be able to talk. ? Can be done in short sessions several times a day or longer sessions a few times a week. For example, on 5 days each week, you could walk fast or ride your bike 3  times a day for 10 minutes each time.   Medicines  Your health care provider may recommend medicines to help lower cholesterol. This may be a medicine to lower the amount of cholesterol that your liver makes. You may need medicine if: ? Diet and lifestyle changes have not lowered your cholesterol enough. ? You have high cholesterol and other risk factors for heart disease or stroke.  Take over-the-counter and prescription medicines only as told by your health care provider. General information  Manage your risk factors for high cholesterol. Talk with your health care provider about all your risk factors and how to lower your risk.  Manage other conditions that you have, such as diabetes or high blood pressure (hypertension).  Have blood tests to check your cholesterol levels at regular points in time as told by your health care provider.  Keep all follow-up visits as told by your health care provider. This is important. Where to find more information  American  Heart Association: www.heart.org  National Heart, Lung, and Blood Institute: https://wilson-eaton.com/ Summary  High cholesterol increases your risk for heart disease and stroke. By keeping your cholesterol level low, you can reduce your risk for these conditions.  High cholesterol can often be prevented with diet and lifestyle changes.  Work with your health care provider to manage your risk factors, and have your blood tested regularly. This information is not intended to replace advice given to you by your health care provider. Make sure you discuss any questions you have with your health care provider. Document Revised: 09/22/2019 Document Reviewed: 09/22/2019 Elsevier Patient Education  [place].

## 2021-03-10 DIAGNOSIS — R002 Palpitations: Secondary | ICD-10-CM | POA: Diagnosis not present

## 2021-03-15 ENCOUNTER — Other Ambulatory Visit: Payer: Self-pay

## 2021-03-15 ENCOUNTER — Encounter: Payer: Self-pay | Admitting: Internal Medicine

## 2021-03-15 ENCOUNTER — Ambulatory Visit (INDEPENDENT_AMBULATORY_CARE_PROVIDER_SITE_OTHER): Payer: PPO | Admitting: Internal Medicine

## 2021-03-15 VITALS — BP 104/68 | HR 72 | Temp 98.0°F | Ht 68.5 in | Wt 148.4 lb

## 2021-03-15 DIAGNOSIS — M7550 Bursitis of unspecified shoulder: Secondary | ICD-10-CM | POA: Insufficient documentation

## 2021-03-15 DIAGNOSIS — N83202 Unspecified ovarian cyst, left side: Secondary | ICD-10-CM

## 2021-03-15 DIAGNOSIS — J4520 Mild intermittent asthma, uncomplicated: Secondary | ICD-10-CM | POA: Diagnosis not present

## 2021-03-15 DIAGNOSIS — R002 Palpitations: Secondary | ICD-10-CM | POA: Diagnosis not present

## 2021-03-15 DIAGNOSIS — K219 Gastro-esophageal reflux disease without esophagitis: Secondary | ICD-10-CM | POA: Diagnosis not present

## 2021-03-15 DIAGNOSIS — M6530 Trigger finger, unspecified finger: Secondary | ICD-10-CM | POA: Insufficient documentation

## 2021-03-15 DIAGNOSIS — Z853 Personal history of malignant neoplasm of breast: Secondary | ICD-10-CM

## 2021-03-15 DIAGNOSIS — Z13220 Encounter for screening for lipoid disorders: Secondary | ICD-10-CM

## 2021-03-15 DIAGNOSIS — M654 Radial styloid tenosynovitis [de Quervain]: Secondary | ICD-10-CM | POA: Insufficient documentation

## 2021-03-15 LAB — LIPID PANEL
Cholesterol: 169 mg/dL (ref 0–200)
HDL: 66.4 mg/dL (ref >39.00)
LDL Cholesterol: 92 mg/dL (ref 0–99)
NonHDL: 102.43
Total CHOL/HDL Ratio: 3
Triglycerides: 51 mg/dL (ref 0.0–149.0)
VLDL: 10.2 mg/dL (ref 0.0–40.0)

## 2021-03-15 LAB — COMPREHENSIVE METABOLIC PANEL
ALT: 15 U/L (ref 0–35)
AST: 18 U/L (ref 0–37)
Albumin: 4.3 g/dL (ref 3.5–5.2)
Alkaline Phosphatase: 72 U/L (ref 39–117)
BUN: 22 mg/dL (ref 6–23)
CO2: 27 mEq/L (ref 19–32)
Calcium: 9.2 mg/dL (ref 8.4–10.5)
Chloride: 106 mEq/L (ref 96–112)
Creatinine, Ser: 0.83 mg/dL (ref 0.40–1.20)
GFR: 72.23 mL/min (ref >60.00)
Glucose, Bld: 96 mg/dL (ref 70–99)
Potassium: 4 mEq/L (ref 3.5–5.1)
Sodium: 140 mEq/L (ref 135–145)
Total Bilirubin: 0.4 mg/dL (ref 0.2–1.2)
Total Protein: 6.3 g/dL (ref 6.0–8.3)

## 2021-03-15 MED ORDER — ZOLPIDEM TARTRATE 5 MG PO TABS: 5.0000 mg | ORAL_TABLET | Freq: Every evening | ORAL | 0 refills | Status: DC | PRN

## 2021-03-15 NOTE — Progress Notes (Addendum)
[name], female   DOB: 10/06/1951, 70 y.o.   MRN: [id number]   Subjective:    [name], female    DOB: 1951/06/26, 70 y.o.   MRN: [id number]  HPI This visit occurred during the SARS-CoV-2 public health emergency.  Safety protocols were in place, including screening questions prior to the visit, additional usage of staff PPE, and extensive cleaning of exam room while observing appropriate contact time as indicated for disinfecting solutions.  Patient here for a scheduled follow up.  Here to follow up regarding palpitations and cholesterol.  She recently saw cardiology.  Wore monitor for 2 weeks.  Has f/u planned 04/03/21.  No chest pain or sob.  Stays active.  No acid reflux now on protonix 20mg  q day.  No abdominal pain.  Bowels moving.  Planning for Mohs 04/04/21.  Up to date with mammogram.  Released by Dr Leonides Schanz.     Past Medical History:  Diagnosis Date  . Basal cell carcinoma    multiple removed/[facility]  . Breast cancer ([place]) 12/13   s/p right lumpectomy, s/p chemo and XRT  . DES exposure in utero   . Hepatitis A   . History of chicken pox   . Hyperactive airway disease   . 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 Migraines   . Miscarriage    x3   Past Surgical History:  Procedure Laterality Date  . BASAL CELL CARCINOMA EXCISION     Multiple  . BREAST BIOPSY  1975   Cyst  . BREAST LUMPECTOMY WITH AXILLARY LYMPH NODE DISSECTION  01/06/13, 01/22/13   Dr. Drue Dun, [facility], second surgery for pos margin  . CERVICAL FUSION  2001  . COLONOSCOPY    . DILATION AND CURETTAGE OF UTERUS     x 3  . LAPAROSCOPIC BILATERAL SALPINGO OOPHERECTOMY Bilateral 12/21/2020   Procedure: LAPAROSCOPIC BILATERAL SALPINGO OOPHORECTOMY;  Surgeon: Ward, Honor Loh, MD;  Location: [facility] ORS;  Service: Gynecology;  Laterality: Bilateral;  . VAGINAL DELIVERY     x4  . WISDOM TOOTH EXTRACTION     Family History  Problem Relation Age of Onset  . Pancreatic cancer  Mother   . Cancer Mother        Pancreatic  . Arthritis Mother   . Hyperlipidemia Mother   . Hypertension Mother   . Heart attack Father   . Hyperlipidemia Father   . Hypertension Father   . Heart disease Father 51  . Colon cancer Other   . Cancer Other 50       Breast and Ovarian- dx 50's/Colon  . Birth defects Maternal Aunt        Breast - in 25's  . Stroke Maternal Grandmother   . Birth defects Maternal Aunt        Breast - in 23's   Social History   Socioeconomic History  . Marital status: Married    Spouse name: ED  . Number of children: 4  . Years of education: Not on file  . Highest education level: Not on file  Occupational History  . Occupation: Corporate treasurer - [employer]  Tobacco Use  . Smoking status: Never Smoker  . Smokeless tobacco: Never Used  Substance and Sexual Activity  . Alcohol use: Yes    Alcohol/week: 0.0 standard drinks    Comment: 3 times a week  . Drug use: No  . Sexual activity: Not on file  Other Topics Concern  . Not  on file  Social History Narrative   Lives in [place] with husband.  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    Social Determinants of Health   Financial Resource Strain: Not on file  Food Insecurity: Not on file  Transportation Needs: Not on file  Physical Activity: Not on file  Stress: Not on file  Social Connections: Not on file    Outpatient Encounter Medications as of 03/15/2021  Medication Sig  . albuterol (VENTOLIN HFA) 108 (90 Base) MCG/ACT inhaler Inhale 2 puffs into the lungs every 6 (six) hours as needed for wheezing or shortness of breath.  Marland Kitchen BIOTIN PO Take 10,000 mg by mouth daily.   . butalbital-aspirin-caffeine (FIORINAL) 50-325-40 MG capsule TAKE 1 CAPSULE BY MOUTH TWICE DAILY AS NEEDED FOR HEADACHE  . Cholecalciferol (VITAMIN D3) 125 MCG (5000 UT) CAPS Take 5,000 Units by mouth daily.  . Glucosamine-Chondroitin (MOVE FREE PO) Take 1 capsule by mouth daily. Cartilage/boron/hyalauronic acid  . hydrOXYzine  (ATARAX/VISTARIL) 10 MG tablet TAKE ONE TABLET BY MOUTH EVERY DAY AS NEEDED FOR ITCHING  . ipratropium (ATROVENT) 0.06 % nasal spray SPRAY TWICE INTO EACH NOSTRIL TWICE DAILY  . LORazepam (ATIVAN) 0.5 MG tablet TAKE ONE TABLET EVERY DAY AS NEEDED FOR ANXIETY  . MAGNESIUM BISGLYCINATE PO Take 200 mg by mouth daily.  . Multiple Minerals-Vitamins (CALCIUM CITRATE PLUS/MAGNESIUM PO) Take 2 tablets by mouth 2 (two) times daily. CAL CITRATE 500-MAGNESIUM 80-ZINC-10  . Multiple Vitamins-Minerals (MULTIVITAMIN WOMENS 50+ ADV PO) Take 1 tablet by mouth daily.  . Omega-3 Fatty Acids (OMEGA-3 FISH OIL PO) Take 1,280 mg by mouth 2 (two) times daily.  . pantoprazole (PROTONIX) 20 MG tablet TAKE ONE TABLET BY MOUTH TWICE DAILY BEFORE A MEAL  . propranolol (INDERAL) 40 MG tablet TAKE 1 TABLET BY MOUTH DAILY  . Riboflavin (VITAMIN B2 PO) Take 250 mg by mouth daily.  . TURMERIC CURCUMIN PO Take 2,250 mg by mouth in the morning and at bedtime.  . [DISCONTINUED] zolpidem (AMBIEN) 5 MG tablet TAKE 1 TABLET AT BEDTIME AS NEEDED  . zolpidem (AMBIEN) 5 MG tablet Take 1 tablet (5 mg total) by mouth at bedtime as needed.   No facility-administered encounter medications on file as of 03/15/2021.    Review of Systems  Constitutional: Negative for appetite change and unexpected weight change.  HENT: Negative for congestion and sinus pressure.   Respiratory: Negative for cough, chest tightness and shortness of breath.   Cardiovascular: Negative for chest pain, palpitations and leg swelling.  Gastrointestinal: Negative for abdominal pain, diarrhea, nausea and vomiting.  Genitourinary: Negative for difficulty urinating and dysuria.  Musculoskeletal: Negative for joint swelling and myalgias.  Skin: Negative for color change and rash.  Neurological: Negative for dizziness, light-headedness and headaches.  Psychiatric/Behavioral: Negative for agitation and dysphoric mood.       Objective:    Physical Exam Vitals  reviewed.  Constitutional:      General: She is not in acute distress.    Appearance: Normal appearance.  HENT:     Head: Normocephalic and atraumatic.     Right Ear: External ear normal.     Left Ear: External ear normal.  Eyes:     General: No scleral icterus.       Right eye: No discharge.        Left eye: No discharge.     Conjunctiva/sclera: Conjunctivae normal.  Neck:     Thyroid: No thyromegaly.  Cardiovascular:     Rate and Rhythm: Normal rate and regular rhythm.  Pulmonary:     Effort: No respiratory distress.     Breath sounds: Normal breath sounds. No wheezing.  Abdominal:     General: Bowel sounds are normal.     Palpations: Abdomen is soft.     Tenderness: There is no abdominal tenderness.  Musculoskeletal:        General: No swelling or tenderness.     Cervical back: Neck supple. No tenderness.  Lymphadenopathy:     Cervical: No cervical adenopathy.  Skin:    Findings: No erythema or rash.  Neurological:     Mental Status: She is alert.  Psychiatric:        Mood and Affect: Mood normal.        Behavior: Behavior normal.     BP 104/68   Pulse 72   Temp 98 F (36.7 C) (Oral)   Ht 5' 8.5" (1.74 m)   Wt 148 lb 6.4 oz (67.3 kg)   SpO2 96%   BMI 22.24 kg/m  Wt Readings from Last 3 Encounters:  03/15/21 148 lb 6.4 oz (67.3 kg)  02/24/21 148 lb (67.1 kg)  02/14/21 149 lb 9.6 oz (67.9 kg)     Lab Results  Component Value Date   WBC 9.4 02/14/2021   HGB 14.0 02/14/2021   HCT 40.1 02/14/2021   PLT 285.0 02/14/2021   GLUCOSE 96 03/15/2021   CHOL 169 03/15/2021   TRIG 51.0 03/15/2021   HDL 66.40 03/15/2021   LDLCALC 92 03/15/2021   ALT 15 03/15/2021   AST 18 03/15/2021   NA 140 03/15/2021   K 4.0 03/15/2021   CL 106 03/15/2021   CREATININE 0.83 03/15/2021   BUN 22 03/15/2021   CO2 27 03/15/2021   TSH 3.28 11/30/2020   MICROALBUR <0.7 11/14/2015       Assessment & Plan:   Problem List Items Addressed This Visit    Asthma    Breathing  stable.       GERD (gastroesophageal reflux disease)    Acid reflux doing well on protonix.  Follow.       History of breast cancer    Mammogram 01/24/21 - [facility] II.       Ovarian cyst    S/p BSO.  Ok.  No f/u warranted per pt.       Palpitations - Primary    Previous increased heart rate and palpitations.  Recheck potassium today.  Wore monitor for two weeks.  Has f/u planned 04/03/21.  Doing well.  Stable.        Relevant Orders   Comprehensive metabolic panel (Completed)    Other Visit Diagnoses    Screening cholesterol level       Relevant Orders   Lipid panel (Completed)       Einar Pheasant, MD

## 2021-03-15 NOTE — Progress Notes (Signed)
Pre visit review using our clinic review tool, if applicable. No additional management support is needed unless otherwise documented below in the visit note. 

## 2021-03-17 DIAGNOSIS — R002 Palpitations: Secondary | ICD-10-CM | POA: Diagnosis not present

## 2021-03-18 ENCOUNTER — Encounter: Payer: Self-pay | Admitting: Internal Medicine

## 2021-03-18 NOTE — Assessment & Plan Note (Signed)
Previous increased heart rate and palpitations.  Recheck potassium today.  Wore monitor for two weeks.  Has f/u planned 04/03/21.  Doing well.  Stable.

## 2021-03-18 NOTE — Assessment & Plan Note (Signed)
Mammogram 01/24/21 - Birads II.

## 2021-03-18 NOTE — Assessment & Plan Note (Signed)
Breathing stable.

## 2021-03-18 NOTE — Assessment & Plan Note (Signed)
S/p BSO.  Ok.  No f/u warranted per pt.

## 2021-03-18 NOTE — Assessment & Plan Note (Signed)
Acid reflux doing well on protonix.  Follow.

## 2021-03-19 ENCOUNTER — Encounter: Payer: Self-pay | Admitting: Internal Medicine

## 2021-03-20 ENCOUNTER — Telehealth: Payer: Self-pay

## 2021-03-20 NOTE — Telephone Encounter (Signed)
See my chart message. If persistent symptoms, ok to schedule appt

## 2021-03-20 NOTE — Telephone Encounter (Signed)
Patient coming in for urine sample and scheduled for virtual visit.

## 2021-03-20 NOTE — Telephone Encounter (Signed)
Please call and confirm what symptoms she is having now?  If needs visit/urine check - ok.  If no symptoms, can monitor and let us know.

## 2021-03-20 NOTE — Telephone Encounter (Signed)
Please see mychart sent today.     [place] [facility] TELEPHONE ADVICE RECORD [facility] Patient Name: Cindy Arnold Gender: Female DOB: 1951-12-21 Age: 70 Y 10 M 4 D Return Phone Number: [phone] (Primary) Address: City/State/Zip: [place] [address] Client [facility] Client Site [place] Physician Einar Pheasant - MD Contact Type Call Who Is Calling Patient / Member / Family / Caregiver Call Type Triage / Clinical Relationship To Patient Self Return Phone Number [phone] (Primary) Chief Complaint Urination Frequency Reason for Call Symptomatic / Request for Health Information Initial Comment Caller states woke up w/urgency to urinate; Used Azo kit which tested pos (purple) for leukocytes and neg for nitrites. Was seen Wed for check up; Translation No Nurse Assessment Nurse: Laveda Abbe, RN, Marjory Lies Date/Time Eilene Ghazi Time): 03/18/2021 1:07:39 PM Confirm and document reason for call. If symptomatic, describe symptoms. ---Caller states woke up w/urgency to urinate; Used Azo kit which tested pos (purple) for leukocytes and neg for nitrites. Pt has been drinking a lot of water. Pt states that right now she is feeling better. Pt has no fever or flank pain. No abd pain. Does the patient have any new or worsening symptoms? ---Yes Will a triage be completed? ---Yes Related visit to physician within the last 2 weeks? ---Yes Does the PT have any chronic conditions? (i.e. diabetes, asthma, this includes High risk factors for pregnancy, etc.) ---Yes List chronic conditions. ---Previous UTI Breast Cancer remission Is this a behavioral health or substance abuse call? ---No Guidelines Guideline Title Affirmed Question Affirmed Notes Nurse Date/Time Eilene Ghazi Time) Urination Pain - Female Age > 27 years Hassan Buckler 03/18/2021 1:11:02 PM Disp. Time Eilene Ghazi Time)  Disposition Final User 03/18/2021 1:14:38 PM See PCP within 24 Hours Yes Laveda Abbe, RN, Carlyon Shadow Disagree/Comply Comply Caller Understands Yes PLEASE NOTE: All timestamps contained within this report are represented as [place] Standard Time. CONFIDENTIALTY NOTICE: This fax transmission is intended only for the addressee. It contains information that is legally privileged, confidential or otherwise protected from use or disclosure. If you are not the intended recipient, you are strictly prohibited from reviewing, disclosing, copying using or disseminating any of this information or taking any action in reliance on or regarding this information. If you have received this fax in error, please notify us immediately by telephone so that we can arrange for its return to [place]. Phone: [phone], Toll-Free: [phone], Fax: [phone] Page: 2 of 2 Call Id: [id number] PreDisposition Welcome Advice Given Per Guideline SEE PCP WITHIN 24 HOURS: * IF OFFICE WILL BE OPEN: You need to be examined within the next 24 hours. Call your doctor (or NP/PA) when the office opens and make an appointment. DRINK EXTRA FLUIDS: CRANBERRY JUICE: CRANBERRY JUICE - EXTRA NOTES AND WARNINGS: * Do not drink more than 16 oz (480 ml) of cranberry juice cocktail per day. Too much cranberry juice can be irritating to the bladder. CALL BACK IF: * Fever or back pain occurs * You become worse Referrals REFERRED TO PCP OFFICE

## 2021-03-20 NOTE — Telephone Encounter (Signed)
Pt scheduled for urine and doing virtual tomorrow with Dr Nicki Reaper

## 2021-03-21 ENCOUNTER — Other Ambulatory Visit: Payer: Self-pay

## 2021-03-21 ENCOUNTER — Telehealth (INDEPENDENT_AMBULATORY_CARE_PROVIDER_SITE_OTHER): Payer: PPO | Admitting: Internal Medicine

## 2021-03-21 ENCOUNTER — Encounter: Payer: Self-pay | Admitting: Internal Medicine

## 2021-03-21 ENCOUNTER — Other Ambulatory Visit (INDEPENDENT_AMBULATORY_CARE_PROVIDER_SITE_OTHER): Payer: PPO

## 2021-03-21 DIAGNOSIS — R300 Dysuria: Secondary | ICD-10-CM

## 2021-03-21 DIAGNOSIS — K219 Gastro-esophageal reflux disease without esophagitis: Secondary | ICD-10-CM

## 2021-03-21 DIAGNOSIS — R3915 Urgency of urination: Secondary | ICD-10-CM

## 2021-03-21 LAB — URINALYSIS, ROUTINE W REFLEX MICROSCOPIC
Bilirubin Urine: NEGATIVE
Hgb urine dipstick: NEGATIVE
Ketones, ur: NEGATIVE
Leukocytes,Ua: NEGATIVE
Nitrite: NEGATIVE
RBC / HPF: NONE SEEN (ref 0–?)
Specific Gravity, Urine: 1.01 (ref 1.000–1.030)
Total Protein, Urine: NEGATIVE
Urine Glucose: NEGATIVE
Urobilinogen, UA: 0.2 (ref 0.0–1.0)
pH: 7 (ref 5.0–8.0)

## 2021-03-21 NOTE — Progress Notes (Signed)
[name], female   DOB: 1951/07/13, 70 y.o.   MRN: [id number]   Virtual Visit via video Note  This visit type was conducted due to national recommendations for restrictions regarding the COVID-19 pandemic (e.g. social distancing).  This format is felt to be most appropriate for this patient at this time.  All issues noted in this document were discussed and addressed.  No physical exam was performed (except for noted visual exam findings with Video Visits).   I connected with Cindy Arnold by a video enabled telemedicine application and verified that I am speaking with the correct person using two identifiers. Location patient: home Location provider: work Persons participating in the virtual visit: patient, provider  The limitations, risks, security and privacy concerns of performing an evaluation and management service by video and the availability of in person appointments have been discussed.  It has also been discussed with the patient that there may be a patient responsible charge related to this service. The patient expressed understanding and agreed to proceed.   Reason for visit: work in appt  HPI: She woke up with urinary urgency 03/18/21.  Noticed slight burning.  Home test showed positive leukocytes - negative nitrites. Called nurse line.  Increased fluid intake and started taking AZO.  No hematuria.  No nausea or vomiting.  No abdominal pain.  bowles moving.  No vaginal problems.     ROS: See pertinent positives and negatives per HPI.   Past Medical History:  Diagnosis Date  . Basal cell carcinoma    multiple removed/[facility]  . Breast cancer ([place]) 12/13   s/p right lumpectomy, s/p chemo and XRT  . DES exposure in utero   . Hepatitis A   . History of chicken pox   . Hyperactive airway disease   . 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[place])   . Migraines   . Miscarriage    x3    Past Surgical History:  Procedure Laterality Date  . BASAL CELL  CARCINOMA EXCISION     Multiple  . BREAST BIOPSY  1975   Cyst  . BREAST LUMPECTOMY WITH AXILLARY LYMPH NODE DISSECTION  01/06/13, 01/22/13   Dr. Drue Dun, [facility], second surgery for pos margin  . CERVICAL FUSION  2001  . COLONOSCOPY    . DILATION AND CURETTAGE OF UTERUS     x 3  . LAPAROSCOPIC BILATERAL SALPINGO OOPHERECTOMY Bilateral 12/21/2020   Procedure: LAPAROSCOPIC BILATERAL SALPINGO OOPHORECTOMY;  Surgeon: Ward, Honor Loh, MD;  Location: [facility] ORS;  Service: Gynecology;  Laterality: Bilateral;  . VAGINAL DELIVERY     x4  . WISDOM TOOTH EXTRACTION      Family History  Problem Relation Age of Onset  . Pancreatic cancer Mother   . Cancer Mother        Pancreatic  . Arthritis Mother   . Hyperlipidemia Mother   . Hypertension Mother   . Heart attack Father   . Hyperlipidemia Father   . Hypertension Father   . Heart disease Father 25  . Colon cancer Other   . Cancer Other 50       Breast and Ovarian- dx 50's/Colon  . Birth defects Maternal Aunt        Breast - in 50's  . Stroke Maternal Grandmother   . Birth defects Maternal Aunt        Breast - in 7's    SOCIAL HX: reviewed.    Current Outpatient Medications:  .  albuterol (VENTOLIN HFA) 108 (  90 Base) MCG/ACT inhaler, Inhale 2 puffs into the lungs every 6 (six) hours as needed for wheezing or shortness of breath., Disp: 18 g, Rfl: 0 .  BIOTIN PO, Take 10,000 mg by mouth daily. , Disp: , Rfl:  .  butalbital-aspirin-caffeine (FIORINAL) 50-325-40 MG capsule, TAKE 1 CAPSULE BY MOUTH TWICE DAILY AS NEEDED FOR HEADACHE, Disp: 30 capsule, Rfl: 0 .  Cholecalciferol (VITAMIN D3) 125 MCG (5000 UT) CAPS, Take 5,000 Units by mouth daily., Disp: , Rfl:  .  Glucosamine-Chondroitin (MOVE FREE PO), Take 1 capsule by mouth daily. Cartilage/boron/hyalauronic acid, Disp: , Rfl:  .  hydrOXYzine (ATARAX/VISTARIL) 10 MG tablet, TAKE ONE TABLET BY MOUTH EVERY DAY AS NEEDED FOR ITCHING, Disp: 30 tablet, Rfl: 1 .  ipratropium (ATROVENT) 0.06 %  nasal spray, SPRAY TWICE INTO EACH NOSTRIL TWICE DAILY, Disp: 30 mL, Rfl: 2 .  LORazepam (ATIVAN) 0.5 MG tablet, TAKE ONE TABLET EVERY DAY AS NEEDED FOR ANXIETY, Disp: 30 tablet, Rfl: 0 .  MAGNESIUM BISGLYCINATE PO, Take 200 mg by mouth daily., Disp: , Rfl:  .  Multiple Minerals-Vitamins (CALCIUM CITRATE PLUS/MAGNESIUM PO), Take 2 tablets by mouth 2 (two) times daily. CAL CITRATE 500-MAGNESIUM 80-ZINC-10, Disp: , Rfl:  .  Multiple Vitamins-Minerals (MULTIVITAMIN WOMENS 50+ ADV PO), Take 1 tablet by mouth daily., Disp: , Rfl:  .  Omega-3 Fatty Acids (OMEGA-3 FISH OIL PO), Take 1,280 mg by mouth 2 (two) times daily., Disp: , Rfl:  .  pantoprazole (PROTONIX) 20 MG tablet, TAKE ONE TABLET BY MOUTH TWICE DAILY BEFORE A MEAL, Disp: 180 tablet, Rfl: 1 .  propranolol (INDERAL) 40 MG tablet, TAKE 1 TABLET BY MOUTH DAILY, Disp: 90 tablet, Rfl: 1 .  Riboflavin (VITAMIN B2 PO), Take 250 mg by mouth daily., Disp: , Rfl:  .  TURMERIC CURCUMIN PO, Take 2,250 mg by mouth in the morning and at bedtime., Disp: , Rfl:  .  zolpidem (AMBIEN) 5 MG tablet, Take 1 tablet (5 mg total) by mouth at bedtime as needed., Disp: 30 tablet, Rfl: 0  EXAM:  GENERAL: alert, oriented, appears well and in no acute distress  HEENT: atraumatic, conjunttiva clear, no obvious abnormalities on inspection of external nose and ears  NECK: normal movements of the head and neck  LUNGS: on inspection no signs of respiratory distress, breathing rate appears normal, no obvious gross SOB, gasping or wheezing  CV: no obvious cyanosis  PSYCH/NEURO: pleasant and cooperative, no obvious depression or anxiety, speech and thought processing grossly intact  ASSESSMENT AND PLAN:  Discussed the following assessment and plan:  Problem List Items Addressed This Visit    GERD (gastroesophageal reflux disease)    No upper symptoms reported. On protonix.       Urinary urgency    Described urinary urgency as outlined.  Some dysuria.  Discussed  AZO.  Urine checked.  Urine obtained for culture.  No abx at this time.  Stay hydrated.  Follow.           I discussed the assessment and treatment plan with the patient. The patient was provided an opportunity to ask questions and all were answered. The patient agreed with the plan and demonstrated an understanding of the instructions.   The patient was advised to call back or seek an in-person evaluation if the symptoms worsen or if the condition fails to improve as anticipated.    Einar Pheasant, MD

## 2021-03-22 LAB — URINE CULTURE
MICRO NUMBER:: 11705343
Result:: NO GROWTH
SPECIMEN QUALITY:: ADEQUATE

## 2021-03-25 ENCOUNTER — Encounter: Payer: Self-pay | Admitting: Internal Medicine

## 2021-03-25 DIAGNOSIS — N390 Urinary tract infection, site not specified: Secondary | ICD-10-CM | POA: Diagnosis not present

## 2021-03-25 DIAGNOSIS — R300 Dysuria: Secondary | ICD-10-CM | POA: Diagnosis not present

## 2021-03-26 ENCOUNTER — Encounter: Payer: Self-pay | Admitting: Internal Medicine

## 2021-03-26 NOTE — Assessment & Plan Note (Signed)
No upper symptoms reported.  On protonix.   

## 2021-03-26 NOTE — Assessment & Plan Note (Signed)
Described urinary urgency as outlined.  Some dysuria.  Discussed AZO.  Urine checked.  Urine obtained for culture.  No abx at this time.  Stay hydrated.  Follow.

## 2021-03-27 ENCOUNTER — Telehealth: Payer: Self-pay

## 2021-03-27 NOTE — Telephone Encounter (Signed)
Patient was seen at [facility].       [place] Night - Cl TELEPHONE ADVICE RECORD Access[facility]urse Patient Name: Cindy Arnold Gender: Female DOB: 08-26-1951 Age: 70 Y 10 M 11 D Return Phone Number: [phone] (Primary) Address: City/ State/ Zip: [place]  [address] Client [facility] [place] [place] Night - Cl Client Site [place] Physician Einar Pheasant - MD Contact Type Call Who Is Calling Patient / Member / Family / Caregiver Call Type Triage / Clinical Relationship To Patient Self Return Phone Number [phone] (Primary) Chief Complaint Urine, Blood In Reason for Call Symptomatic / Request for [place] states that she has blood in her urine and severe urgency. Translation No Nurse Assessment Nurse: Marcelline Deist, RN, Mickel Baas Date/Time Eilene Ghazi Time): 03/24/2021 11:30:07 PM Confirm and document reason for call. If symptomatic, describe symptoms. ---Caller states that she has blood in her urine and severe urgency. Does the patient have any new or worsening symptoms? ---Yes Will a triage be completed? ---Yes Related visit to physician within the last 2 weeks? ---Yes Does the PT have any chronic conditions? (i.e. diabetes, asthma, this includes High risk factors for pregnancy, etc.) ---No Is this a behavioral health or substance abuse call? ---No Guidelines Guideline Title Affirmed Question Affirmed Notes Nurse Date/Time (Eastern Time) Urine - Blood In Pain or burning with passing urine Marcelline Deist, RN, Mickel Baas 03/24/2021 11:31:37 PM Disp. Time Eilene Ghazi Time) Disposition Final User 03/24/2021 11:36:09 PM See PCP within 24 Hours Yes Marcelline Deist, RN, Elige Radon Disagree/Comply Comply PLEASE NOTE: All timestamps contained within this report are represented as [place] Standard Time. CONFIDENTIALTY NOTICE: This fax transmission is intended only for the addressee.  It contains information that is legally privileged, confidential or otherwise protected from use or disclosure. If you are not the intended recipient, you are strictly prohibited from reviewing, disclosing, copying using or disseminating any of this information or taking any action in reliance on or regarding this information. If you have received this fax in error, please notify us immediately by telephone so that we can arrange for its return to [place]. Phone: [phone], Toll-Free: [phone], Fax: [phone] Page: 2 of 3 Call Id: [id number] Kamas Understands Yes PreDisposition Call Doctor Care Advice Given Per Guideline SEE PCP WITHIN 24 HOURS: * Drink 8 to 10 cups (1,800 to 2,400 ml) of liquids a day. DRINK EXTRA FLUIDS: SAMPLE: * Bring in a sample of the bloody urine. * Keep it in the refrigerator until you leave. CALL BACK IF: * Fever occurs * You become worse CARE ADVICE given per Urine, Blood In (Adult) guideline. Referrals GO TO FACILITY UNDECIDED Triage Details User: Roe Rutherford Date/Time: 03/24/2021 11:31:37 PM Triage Guideline: Urine - Blood In Shock suspected (e.g., cold/pale/clammy skin, too weak to stand, low BP, rapid pulse) -----No Sounds like a life-threatening emergency to the triager -----No Urinary catheter, questions about -----No Recent back or abdominal injury -----No Recent genital injury -----No [1] Unable to urinate (or only a few drops) > 4 hours AND [2] bladder feels very full (e.g., palpable bladder or strong urge to urinate) -----No Passing pure blood or large blood clots (i.e., size > a dime) (Exception: fleck or small strands) -----No Fever > 100.4 F (38.0 C) -----No Patient sounds very sick or weak to the triager -----No [1] Pain or burning with passing urine AND [2] side (flank) or back pain present -----No Known sickle cell disease PLEASE NOTE: All timestamps contained within  this report are represented as [place] Standard  Time. CONFIDENTIALTY NOTICE: This fax transmission is intended only for the addressee. It contains information that is legally privileged, confidential or otherwise protected from use or disclosure. If you are not the intended recipient, you are strictly prohibited from reviewing, disclosing, copying using or disseminating any of this information or taking any action in reliance on or regarding this information. If you have received this fax in error, please notify us immediately by telephone so that we can arrange for its return to [place]. Phone: [phone], Toll-Free: [phone], Fax: [phone] Page: 3 of 3 Call Id: [id number] Triage Details -----No Taking Coumadin (warfarin) or other strong blood thinner, or known bleeding disorder (e.g., thrombocytopenia) -----No Pain or burning with passing urine -----Yes Disposition: See PCP within 24 Hours SEE PCP WITHIN 24 HOURS: * Drink 8 to 10 cups (1,800 to 2,400 ml) of liquids a day. DRINK EXTRA FLUIDS: SAMPLE: * Bring in a sample of the bloody urine. * Keep it in the refrigerator until you leave. CALL BACK IF: * Fever occurs * You become worse CARE ADVICE given per Urine, Blood In (Adult) guideline.

## 2021-03-29 ENCOUNTER — Encounter: Payer: Self-pay | Admitting: Internal Medicine

## 2021-04-03 ENCOUNTER — Ambulatory Visit (INDEPENDENT_AMBULATORY_CARE_PROVIDER_SITE_OTHER): Payer: PPO | Admitting: Cardiology

## 2021-04-03 ENCOUNTER — Other Ambulatory Visit: Payer: Self-pay

## 2021-04-03 ENCOUNTER — Encounter: Payer: Self-pay | Admitting: Cardiology

## 2021-04-03 VITALS — BP 108/60 | HR 68 | Ht 68.5 in | Wt 148.0 lb

## 2021-04-03 DIAGNOSIS — E7800 Pure hypercholesterolemia, unspecified: Secondary | ICD-10-CM

## 2021-04-03 DIAGNOSIS — I471 Supraventricular tachycardia: Secondary | ICD-10-CM | POA: Diagnosis not present

## 2021-04-03 NOTE — Progress Notes (Signed)
Cardiology Office Note:    Date:  04/03/2021   ID:  Cindy Arnold, DOB 1951-08-14, MRN [id number]  PCP:  Cindy Arnold, [place]  Cardiologist:  Cindy Sable, MD  Advanced Practice Provider:  No care team member to display Electrophysiologist:  None       Referring MD: Cindy Pheasant, MD   Chief Complaint  Patient presents with  . Other    6 week follow up post ZIO. Meds reviewed verbally with patient.     History of Present Illness:    Cindy Arnold is a 70 y.o. 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  Last seen due to palpitations.  Cardiac monitor was placed to evaluate any significant arrhythmias.  She states her symptoms overall have improved.  The patient has occasional skipped heartbeats but nothing that influences her activities of daily living.  Previous episode of tachycardia or palpitation was associated with hypokalemia.   She feels well, has no concerns at this time.  Takes propanolol 40 mg daily for ophthalmic migraine.  Prior notes She has a history of ophthalmic migraine for which she takes propranolol 40 mg daily.  Past Medical History:  Diagnosis Date  . Basal cell carcinoma    multiple removed/[facility]  . Breast cancer ([facility]) 12/13   s/p right lumpectomy, s/p chemo and XRT  . DES exposure in utero   . Hepatitis A   . History of chicken pox   . Hyperactive airway disease   . 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[place])   . Migraines   . Miscarriage    x3    Past Surgical History:  Procedure Laterality Date  . BASAL CELL CARCINOMA EXCISION     Multiple  . BREAST BIOPSY  1975   Cyst  . BREAST LUMPECTOMY WITH AXILLARY LYMPH NODE DISSECTION  01/06/13, 01/22/13   Dr. Drue Dun, [facility], second surgery for pos margin  . CERVICAL FUSION  2001  . COLONOSCOPY    . DILATION AND CURETTAGE OF UTERUS     x 3  . LAPAROSCOPIC  BILATERAL SALPINGO OOPHERECTOMY Bilateral 12/21/2020   Procedure: LAPAROSCOPIC BILATERAL SALPINGO OOPHORECTOMY;  Surgeon: Ward, Honor Loh, MD;  Location: [facility] ORS;  Service: Gynecology;  Laterality: Bilateral;  . VAGINAL DELIVERY     x4  . WISDOM TOOTH EXTRACTION      Current Medications: Current Meds  Medication Sig  . albuterol (VENTOLIN HFA) 108 (90 Base) MCG/ACT inhaler Inhale 2 puffs into the lungs every 6 (six) hours as needed for wheezing or shortness of breath.  Marland Kitchen BIOTIN PO Take 10,000 mg by mouth daily.   . butalbital-aspirin-caffeine (FIORINAL) 50-325-40 MG capsule TAKE 1 CAPSULE BY MOUTH TWICE DAILY AS NEEDED FOR HEADACHE  . Cholecalciferol (VITAMIN D3) 125 MCG (5000 UT) CAPS Take 5,000 Units by mouth daily.  . Glucosamine-Chondroitin (MOVE FREE PO) Take 1 capsule by mouth daily. Cartilage/boron/hyalauronic acid  . hydrOXYzine (ATARAX/VISTARIL) 10 MG tablet TAKE ONE TABLET BY MOUTH EVERY DAY AS NEEDED FOR ITCHING  . ipratropium (ATROVENT) 0.06 % nasal spray SPRAY TWICE INTO EACH NOSTRIL TWICE DAILY  . LORazepam (ATIVAN) 0.5 MG tablet TAKE ONE TABLET EVERY DAY AS NEEDED FOR ANXIETY  . MAGNESIUM BISGLYCINATE PO Take 200 mg by mouth daily.  . Multiple Minerals-Vitamins (CALCIUM CITRATE PLUS/MAGNESIUM PO) Take 2 tablets by mouth 2 (two) times daily. CAL CITRATE 500-MAGNESIUM 80-ZINC-10  . Multiple Vitamins-Minerals (MULTIVITAMIN WOMENS  50+ ADV PO) Take 1 tablet by mouth daily.  . Omega-3 Fatty Acids (OMEGA-3 FISH OIL PO) Take 1,280 mg by mouth 2 (two) times daily.  . pantoprazole (PROTONIX) 20 MG tablet TAKE ONE TABLET BY MOUTH TWICE DAILY BEFORE A MEAL  . propranolol (INDERAL) 40 MG tablet TAKE 1 TABLET BY MOUTH DAILY  . Riboflavin (VITAMIN B2 PO) Take 250 mg by mouth daily.  . TURMERIC CURCUMIN PO Take 2,250 mg by mouth in the morning and at bedtime.  Marland Kitchen zolpidem (AMBIEN) 5 MG tablet Take 1 tablet (5 mg total) by mouth at bedtime as needed.     Allergies:   Patient has no known  allergies.   Social History   Socioeconomic History  . Marital status: Married    Spouse name: ED  . Number of children: 4  . Years of education: Not on file  . Highest education level: Not on file  Occupational History  . 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 [employer]  Tobacco Use  . Smoking status: Never Smoker  . Smokeless tobacco: Never Used  Substance and Sexual Activity  . Alcohol use: Yes    Alcohol/week: 0.0 standard drinks    Comment: 3 times a week  . Drug use: No  . Sexual activity: Not on file  Other Topics Concern  . Not on file  Social History Narrative   Lives in [place] with husband.  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    Social Determinants of Health   Financial Resource Strain: Not on file  Food Insecurity: Not on file  Transportation Needs: Not on file  Physical Activity: Not on file  Stress: Not on file  Social Connections: Not on file     Family History: The patient's family history includes Arthritis in her mother; Birth defects in her maternal aunt and maternal aunt; Cancer in her mother; Cancer (age of onset: 67) in an other family member; Colon cancer in an other family member; Heart attack in her father; Heart disease (age of onset: 67) in her father; Hyperlipidemia in her father and mother; Hypertension in her father and mother; Pancreatic cancer in her mother; Stroke in her maternal grandmother.  ROS:   Please see the history of present illness.     All other systems reviewed and are negative.  EKGs/Labs/Other Studies Reviewed:    The following studies were reviewed today:   EKG:  EKG not  ordered today.   Recent Labs: 11/30/2020: TSH 3.28 02/14/2021: Hemoglobin 14.0; Magnesium 2.4; Platelets 285.0 03/15/2021: ALT 15; BUN 22; Creatinine, Ser 0.83; Potassium 4.0; Sodium 140  Recent Lipid Panel    Component Value Date/Time   CHOL 169 03/15/2021 1042   TRIG 51.0 03/15/2021 1042   HDL 66.40 03/15/2021 1042   CHOLHDL 3 03/15/2021 1042   VLDL 10.2  03/15/2021 1042   LDLCALC 92 03/15/2021 1042     Risk Assessment/Calculations:      Physical Exam:    VS:  BP 108/60 (BP Location: Left Arm, Patient Position: Sitting, Cuff Size: Normal)   Pulse 68   Ht 5' 8.5" (1.74 m)   Wt 148 lb (67.1 kg)   SpO2 98%   BMI 22.18 kg/m     Wt Readings from Last 3 Encounters:  04/03/21 148 lb (67.1 kg)  03/21/21 148 lb (67.1 kg)  03/15/21 148 lb 6.4 oz (67.3 kg)     GEN:  Well nourished, well developed in no acute distress HEENT: Normal NECK: No JVD; No carotid bruits LYMPHATICS: No lymphadenopathy  CARDIAC: RRR, no murmurs, rubs, gallops RESPIRATORY:  Clear to auscultation without rales, wheezing or rhonchi  ABDOMEN: Soft, non-tender, non-distended MUSCULOSKELETAL:  No edema; No deformity  SKIN: Warm and dry NEUROLOGIC:  Alert and oriented x 3 PSYCHIATRIC:  Normal affect   ASSESSMENT:    1. Paroxysmal SVT (supraventricular tachycardia) ([place])   2. Pure hypercholesterolemia    PLAN:    In order of problems listed above:  1. Palpitations, cardiac monitor showed occasional paroxysmal SVTs, no other significant arrhythmias noted.  Symptoms overall appear improved.  Continue propanolol 40 mg daily.  May consider increasing propranolol dose if symptoms become worse. 2. Hyperlipidemia, cholesterol levels controlled.  Continue low-cholesterol diet.   Follow-up in 1 year.   Medication Adjustments/Labs and Tests Ordered: Current medicines are reviewed at length with the patient today.  Concerns regarding medicines are outlined above.  No orders of the defined types were placed in this encounter.  No orders of the defined types were placed in this encounter.   Patient Instructions  Medication Instructions:  Your physician recommends that you continue on your current medications as directed. Please refer to the Current Medication list given to you today.  *If you need a refill on your cardiac medications before your next appointment,  please call your pharmacy*   Lab Work: None ordered If you have labs (blood work) drawn today and your tests are completely normal, you will receive your results only by: Marland Kitchen MyChart Message (if you have MyChart) OR . A paper copy in the mail If you have any lab test that is abnormal or we need to change your treatment, we will call you to review the results.   Testing/Procedures: None ordered   Follow-Up: At [facility], you and your health needs are our priority.  As part of our continuing mission to provide you with exceptional heart care, we have created designated Provider Care Teams.  These Care Teams include your primary Cardiologist (physician) and Advanced Practice Providers (APPs -  Physician Assistants and Nurse Practitioners) who all work together to provide you with the care you need, when you need it.  We recommend signing up for the patient portal called "MyChart".  Sign up information is provided on this After Visit Summary.  MyChart is used to connect with patients for Virtual Visits (Telemedicine).  Patients are able to view lab/test results, encounter notes, upcoming appointments, etc.  Non-urgent messages can be sent to your provider as well.   To learn more about what you can do with MyChart, go to [email or web address].    Your next appointment:   1 year(s)  The format for your next appointment:   In Person  Provider:   Kate Sable, MD   Other Instructions      Signed, Cindy Sable, MD  04/03/2021 12:30 PM    [place]

## 2021-04-03 NOTE — Patient Instructions (Signed)

## 2021-04-04 DIAGNOSIS — L578 Other skin changes due to chronic exposure to nonionizing radiation: Secondary | ICD-10-CM | POA: Diagnosis not present

## 2021-04-04 DIAGNOSIS — Z85828 Personal history of other malignant neoplasm of skin: Secondary | ICD-10-CM | POA: Diagnosis not present

## 2021-04-04 DIAGNOSIS — L988 Other specified disorders of the skin and subcutaneous tissue: Secondary | ICD-10-CM | POA: Diagnosis not present

## 2021-04-04 DIAGNOSIS — C44519 Basal cell carcinoma of skin of other part of trunk: Secondary | ICD-10-CM | POA: Diagnosis not present

## 2021-04-04 DIAGNOSIS — C44529 Squamous cell carcinoma of skin of other part of trunk: Secondary | ICD-10-CM | POA: Diagnosis not present

## 2021-04-11 ENCOUNTER — Other Ambulatory Visit: Payer: Self-pay | Admitting: Internal Medicine

## 2021-04-11 ENCOUNTER — Encounter: Payer: Self-pay | Admitting: Internal Medicine

## 2021-04-28 ENCOUNTER — Ambulatory Visit (INDEPENDENT_AMBULATORY_CARE_PROVIDER_SITE_OTHER): Payer: PPO

## 2021-04-28 VITALS — Ht 68.5 in | Wt 148.0 lb

## 2021-04-28 DIAGNOSIS — Z0000 Encounter for general adult medical examination without abnormal findings: Secondary | ICD-10-CM | POA: Diagnosis not present

## 2021-04-28 NOTE — Patient Instructions (Addendum)
Cindy Arnold , Thank you for taking time to come for your Medicare Wellness Visit. I appreciate your ongoing commitment to your health goals. Please review the following plan we discussed and let me know if I can assist you in the future.   These are the goals we discussed: Goals      Patient Stated   .  Advanced Care Planning (pt-stated)      I plan to complete advanced directive        This is a list of the screening recommended for you and due dates:  Health Maintenance  Topic Date Due  . COVID-19 Vaccine (4 - Booster) 06/29/2021  . Flu Shot  07/24/2021  . Colon Cancer Screening  02/05/2022  . Mammogram  01/24/2023  . Tetanus Vaccine  07/09/2027  . DEXA scan (bone density measurement)  Completed  . Hepatitis C Screening: USPSTF Recommendation to screen - Ages 16-79 yo.  Completed  . Pneumonia vaccines  Completed  . HPV Vaccine  Aged Out    Immunizations Immunization History  Administered Date(s) Administered  . Fluad Quad(high Dose 65+) 09/15/2020  . Influenza Split 11/14/2011, 10/02/2014  . Influenza, High Dose Seasonal PF 09/26/2018, 10/03/2019  . Influenza-Unspecified 10/22/2013, 08/26/2015, 10/03/2016  . Moderna SARS-COV2 Booster Vaccination 12/30/2020  . PFIZER(Purple Top)SARS-COV-2 Vaccination 03/14/2020, 04/06/2020  . Pneumococcal Conjugate-13 08/21/2017  . Pneumococcal Polysaccharide-23 11/05/2013, 12/27/2019  . Tdap 05/14/2011, 07/08/2017  . Zoster 10/25/2011  . Zoster Recombinat (Shingrix) 11/15/2017, 03/04/2018   Keep all routine maintenance appointments.   Follow up 07/17/21 @ 10:30  Advanced directives: not yet completed  Conditions/risks identified: none new  Follow up in one year for your annual wellness visit    Preventive Care 65 Years and Older, Female Preventive care refers to lifestyle choices and visits with your health care provider that can promote health and wellness. What does preventive care include?  A yearly physical exam. This is  also called an annual well check.  Dental exams once or twice a year.  Routine eye exams. Ask your health care provider how often you should have your eyes checked.  Personal lifestyle choices, including:  Daily care of your teeth and gums.  Regular physical activity.  Eating a healthy diet.  Avoiding tobacco and drug use.  Limiting alcohol use.  Practicing safe sex.  Taking low-dose aspirin every day.  Taking vitamin and mineral supplements as recommended by your health care provider. What happens during an annual well check? The services and screenings done by your health care provider during your annual well check will depend on your age, overall health, lifestyle risk factors, and family history of disease. Counseling  Your health care provider may ask you questions about your:  Alcohol use.  Tobacco use.  Drug use.  Emotional well-being.  Home and relationship well-being.  Sexual activity.  Eating habits.  History of falls.  Memory and ability to understand (cognition).  Work and work Statistician.  Reproductive health. Screening  You may have the following tests or measurements:  Height, weight, and BMI.  Blood pressure.  Lipid and cholesterol levels. These may be checked every 5 years, or more frequently if you are over 50 years old.  Skin check.  Lung cancer screening. You may have this screening every year starting at age 50 if you have a 30-pack-year history of smoking and currently smoke or have quit within the past 15 years.  Fecal occult blood test (FOBT) of the stool. You may have this test every  year starting at age 49.  Flexible sigmoidoscopy or colonoscopy. You may have a sigmoidoscopy every 5 years or a colonoscopy every 10 years starting at age 15.  Hepatitis C blood test.  Hepatitis B blood test.  Sexually transmitted disease (STD) testing.  Diabetes screening. This is done by checking your blood sugar (glucose) after you have  not eaten for a while (fasting). You may have this done every 1-3 years.  Bone density scan. This is done to screen for osteoporosis. You may have this done starting at age 38.  Mammogram. This may be done every 1-2 years. Talk to your health care provider about how often you should have regular mammograms. Talk with your health care provider about your test results, treatment options, and if necessary, the need for more tests. Vaccines  Your health care provider may recommend certain vaccines, such as:  Influenza vaccine. This is recommended every year.  Tetanus, diphtheria, and acellular pertussis (Tdap, Td) vaccine. You may need a Td booster every 10 years.  Zoster vaccine. You may need this after age 49.  Pneumococcal 13-valent conjugate (PCV13) vaccine. One dose is recommended after age 2.  Pneumococcal polysaccharide (PPSV23) vaccine. One dose is recommended after age 46. Talk to your health care provider about which screenings and vaccines you need and how often you need them. This information is not intended to replace advice given to you by your health care provider. Make sure you discuss any questions you have with your health care provider. Document Released: 01/06/2016 Document Revised: 08/29/2016 Document Reviewed: 10/11/2015 Elsevier Interactive Patient Education  2017 [place] Prevention in the Home Falls can cause injuries. They can happen to people of all ages. There are many things you can do to make your home safe and to help prevent falls. What can I do on the outside of my home?  Regularly fix the edges of walkways and driveways and fix any cracks.  Remove anything that might make you trip as you walk through a door, such as a raised step or threshold.  Trim any bushes or trees on the path to your home.  Use bright outdoor lighting.  Clear any walking paths of anything that might make someone trip, such as rocks or tools.  Regularly check to see  if handrails are loose or broken. Make sure that both sides of any steps have handrails.  Any raised decks and porches should have guardrails on the edges.  Have any leaves, snow, or ice cleared regularly.  Use sand or salt on walking paths during winter.  Clean up any spills in your garage right away. This includes oil or grease spills. What can I do in the bathroom?  Use night lights.  Install grab bars by the toilet and in the tub and shower. Do not use towel bars as grab bars.  Use non-skid mats or decals in the tub or shower.  If you need to sit down in the shower, use a plastic, non-slip stool.  Keep the floor dry. Clean up any water that spills on the floor as soon as it happens.  Remove soap buildup in the tub or shower regularly.  Attach bath mats securely with double-sided non-slip rug tape.  Do not have throw rugs and other things on the floor that can make you trip. What can I do in the bedroom?  Use night lights.  Make sure that you have a light by your bed that is easy to reach.  Do  not use any sheets or blankets that are too big for your bed. They should not hang down onto the floor.  Have a firm chair that has side arms. You can use this for support while you get dressed.  Do not have throw rugs and other things on the floor that can make you trip. What can I do in the kitchen?  Clean up any spills right away.  Avoid walking on wet floors.  Keep items that you use a lot in easy-to-reach places.  If you need to reach something above you, use a strong step stool that has a grab bar.  Keep electrical cords out of the way.  Do not use floor polish or wax that makes floors slippery. If you must use wax, use non-skid floor wax.  Do not have throw rugs and other things on the floor that can make you trip. What can I do with my stairs?  Do not leave any items on the stairs.  Make sure that there are handrails on both sides of the stairs and use them.  Fix handrails that are broken or loose. Make sure that handrails are as long as the stairways.  Check any carpeting to make sure that it is firmly attached to the stairs. Fix any carpet that is loose or worn.  Avoid having throw rugs at the top or bottom of the stairs. If you do have throw rugs, attach them to the floor with carpet tape.  Make sure that you have a light switch at the top of the stairs and the bottom of the stairs. If you do not have them, ask someone to add them for you. What else can I do to help prevent falls?  Wear shoes that:  Do not have high heels.  Have rubber bottoms.  Are comfortable and fit you well.  Are closed at the toe. Do not wear sandals.  If you use a stepladder:  Make sure that it is fully opened. Do not climb a closed stepladder.  Make sure that both sides of the stepladder are locked into place.  Ask someone to hold it for you, if possible.  Clearly mark and make sure that you can see:  Any grab bars or handrails.  First and last steps.  Where the edge of each step is.  Use tools that help you move around (mobility aids) if they are needed. These include:  Canes.  Walkers.  Scooters.  Crutches.  Turn on the lights when you go into a dark area. Replace any light bulbs as soon as they burn out.  Set up your furniture so you have a clear path. Avoid moving your furniture around.  If any of your floors are uneven, fix them.  If there are any pets around you, be aware of where they are.  Review your medicines with your doctor. Some medicines can make you feel dizzy. This can increase your chance of falling. Ask your doctor what other things that you can do to help prevent falls. This information is not intended to replace advice given to you by your health care provider. Make sure you discuss any questions you have with your health care provider. Document Released: 10/06/2009 Document Revised: 05/17/2016 Document Reviewed:  01/14/2015 Elsevier Interactive Patient Education  2017 [employer].

## 2021-04-28 NOTE — Progress Notes (Signed)
Subjective:   Cindy Arnold is a 70 y.o. female who presents for Medicare Annual (Subsequent) preventive examination.  Review of Systems    No ROS.  Medicare Wellness Virtual Visit.  Visual/audio telehealth visit, UTA vital signs.   See social history for additional risk factors.   Cardiac Risk Factors include: advanced age (>36men, >3 women)     Objective:    Today's Vitals   04/28/21 1326  Weight: 148 lb (67.1 kg)  Height: 5' 8.5" (1.74 m)   Body mass index is 22.18 kg/m.  Advanced Directives 04/28/2021 12/14/2020 11/18/2020 04/27/2020 04/27/2019  Does Patient Have a Medical Advance Directive? No No No No Yes  Type of Advance Directive - - - - Press photographer;Living will  Does patient want to make changes to medical advance directive? - - - - No - Guardian declined  Copy of [place] in Chart? - - - - No - copy requested  Would patient like information on creating a medical advance directive? No - Patient declined - - No - Patient declined -    Current Medications (verified) Outpatient Encounter Medications as of 04/28/2021  Medication Sig  . albuterol (VENTOLIN HFA) 108 (90 Base) MCG/ACT inhaler Inhale 2 puffs into the lungs every 6 (six) hours as needed for wheezing or shortness of breath.  Marland Kitchen BIOTIN PO Take 10,000 mg by mouth daily.   . butalbital-aspirin-caffeine (FIORINAL) 50-325-40 MG capsule TAKE 1 CAPSULE BY MOUTH TWICE DAILY AS NEEDED FOR HEADACHE  . Cholecalciferol (VITAMIN D3) 125 MCG (5000 UT) CAPS Take 5,000 Units by mouth daily.  . Glucosamine-Chondroitin (MOVE FREE PO) Take 1 capsule by mouth daily. Cartilage/boron/hyalauronic acid  . hydrOXYzine (ATARAX/VISTARIL) 10 MG tablet TAKE ONE TABLET BY MOUTH EVERY DAY AS NEEDED FOR ITCHING  . ipratropium (ATROVENT) 0.06 % nasal spray SPRAY TWICE INTO EACH NOSTRIL TWICE DAILY  . LORazepam (ATIVAN) 0.5 MG tablet TAKE ONE TABLET EVERY DAY AS NEEDED FOR ANXIETY  . MAGNESIUM BISGLYCINATE PO  Take 200 mg by mouth daily.  . Multiple Minerals-Vitamins (CALCIUM CITRATE PLUS/MAGNESIUM PO) Take 2 tablets by mouth 2 (two) times daily. CAL CITRATE 500-MAGNESIUM 80-ZINC-10  . Multiple Vitamins-Minerals (MULTIVITAMIN WOMENS 50+ ADV PO) Take 1 tablet by mouth daily.  . Omega-3 Fatty Acids (OMEGA-3 FISH OIL PO) Take 1,280 mg by mouth 2 (two) times daily.  . pantoprazole (PROTONIX) 20 MG tablet TAKE ONE TABLET BY MOUTH TWICE DAILY BEFORE A MEAL  . propranolol (INDERAL) 40 MG tablet TAKE 1 TABLET BY MOUTH DAILY  . Riboflavin (VITAMIN B2 PO) Take 250 mg by mouth daily.  . TURMERIC CURCUMIN PO Take 2,250 mg by mouth in the morning and at bedtime.  Marland Kitchen zolpidem (AMBIEN) 5 MG tablet Take 1 tablet (5 mg total) by mouth at bedtime as needed.   No facility-administered encounter medications on file as of 04/28/2021.    Allergies (verified) Patient has no known allergies.   History: Past Medical History:  Diagnosis Date  . Basal cell carcinoma    multiple removed/[facility]  . Breast cancer ([place]) 12/13   s/p right lumpectomy, s/p chemo and XRT  . DES exposure in utero   . Hepatitis A   . History of chicken pox   . Hyperactive airway disease   . 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 Migraines   . Miscarriage    x3   Past Surgical History:  Procedure Laterality Date  . BASAL CELL CARCINOMA EXCISION  Multiple  . BREAST BIOPSY  1975   Cyst  . BREAST LUMPECTOMY WITH AXILLARY LYMPH NODE DISSECTION  01/06/13, 01/22/13   Dr. Drue Dun, [facility], second surgery for pos margin  . CERVICAL FUSION  2001  . COLONOSCOPY    . DILATION AND CURETTAGE OF UTERUS     x 3  . LAPAROSCOPIC BILATERAL SALPINGO OOPHERECTOMY Bilateral 12/21/2020   Procedure: LAPAROSCOPIC BILATERAL SALPINGO OOPHORECTOMY;  Surgeon: Ward, Honor Loh, MD;  Location: [facility] ORS;  Service: Gynecology;  Laterality: Bilateral;  . VAGINAL DELIVERY     x4  . WISDOM TOOTH EXTRACTION     Family History  Problem Relation Age of  Onset  . Pancreatic cancer Mother   . Cancer Mother        Pancreatic  . Arthritis Mother   . Hyperlipidemia Mother   . Hypertension Mother   . Heart attack Father   . Hyperlipidemia Father   . Hypertension Father   . Heart disease Father 22  . Colon cancer Other   . Cancer Other 50       Breast and Ovarian- dx 50's/Colon  . Birth defects Maternal Aunt        Breast - in 77's  . Stroke Maternal Grandmother   . Birth defects Maternal Aunt        Breast - in 16's   Social History   Socioeconomic History  . Marital status: Married    Spouse name: ED  . Number of children: 4  . Years of education: Not on file  . Highest education level: Not on file  Occupational History  . Occupation: Corporate treasurer - [employer]  Tobacco Use  . Smoking status: Never Smoker  . Smokeless tobacco: Never Used  Substance and Sexual Activity  . Alcohol use: Yes    Alcohol/week: 0.0 standard drinks    Comment: 3 times a week  . Drug use: No  . Sexual activity: Not on file  Other Topics Concern  . Not on file  Social History Narrative   Lives in [place] with husband.  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    Social Determinants of Health   Financial Resource Strain: Low Risk   . Difficulty of Paying Living Expenses: Not hard at all  Food Insecurity: No Food Insecurity  . Worried About Charity fundraiser in the Last Year: Never true  . Ran Out of Food in the Last Year: Never true  Transportation Needs: No Transportation Needs  . Lack of Transportation (Medical): No  . Lack of Transportation (Non-Medical): No  Physical Activity: Sufficiently Active  . Days of Exercise per Week: 5 days  . Minutes of Exercise per Session: 40 min  Stress: No Stress Concern Present  . Feeling of Stress : Only a little  Social Connections: Unknown  . Frequency of Communication with Friends and Family: More than three times a week  . Frequency of Social Gatherings with Friends and Family: More than three times a  week  . Attends Religious Services: Not on file  . Active Member of Clubs or Organizations: Not on file  . Attends Archivist Meetings: Not on file  . Marital Status: Not on file    Tobacco Counseling Counseling given: Not Answered   Clinical Intake:  Pre-visit preparation completed: Yes        Diabetes: No  How often do you need to have someone help you when you read instructions, pamphlets, or other written materials from your doctor or pharmacy?:  1 - Never    Interpreter Needed?: No      Activities of Daily Living In your present state of health, do you have any difficulty performing the following activities: 04/28/2021 12/14/2020  Hearing? N Y  Comment - right ear hearing loss  Vision? N Y  Comment - contacts  Difficulty concentrating or making decisions? N N  Walking or climbing stairs? N N  Dressing or bathing? N N  Doing errands, shopping? N N  Preparing Food and eating ? N -  Using the Toilet? N -  In the past six months, have you accidently leaked urine? N -  Do you have problems with loss of bowel control? N -  Managing your Medications? N -  Managing your Finances? N -  Housekeeping or managing your Housekeeping? N -  Some recent data might be hidden    Patient Care Team: Einar Pheasant, MD as PCP - General (Internal Medicine) Kate Sable, MD as PCP - Cardiology (Cardiology)  Indicate any recent Medical Services you may have received from other than [facility] providers in the past year (date may be approximate).     Assessment:   This is a routine wellness examination for Birdella.  I connected with Genever today by telephone and verified that I am speaking with the correct person using two identifiers. Location patient: home Location provider: work Persons participating in the virtual visit: patient, Marine scientist.    I discussed the limitations, risks, security and privacy concerns of performing an evaluation and management service by  telephone and the availability of in person appointments. The patient expressed understanding and verbally consented to this telephonic visit.    Interactive audio and video telecommunications were attempted between this provider and patient, however failed, due to patient having technical difficulties OR patient did not have access to video capability.  We continued and completed visit with audio only.  Some vital signs may be absent or patient reported.   Hearing/Vision screen  Hearing Screening   125Hz  250Hz  500Hz  1000Hz  2000Hz  3000Hz  4000Hz  6000Hz  8000Hz   Right ear:           Left ear:           Comments: Hearing aid, bilateral   Vision Screening Comments: Wears corrective lenses  Visual acuity not assessed, virtual visit. They have seen their ophthalmologist in the last 12 months  Dietary issues and exercise activities discussed: Current Exercise Habits: Home exercise routine, Type of exercise: walking, Time (Minutes): 60, Frequency (Times/Week): 4, Weekly Exercise (Minutes/Week): 240, Intensity: Mild  Healthy diet Good water intake  Goals Addressed              This Visit's Progress     Patient Stated   .  Advanced Care Planning (pt-stated)        I plan to complete advanced directive       Depression Screen PHQ 2/9 Scores 04/28/2021 03/21/2021 03/15/2021 04/27/2020 04/27/2019 12/06/2017 05/15/2017  PHQ - 2 Score 0 0 0 0 0 0 0  PHQ- 9 Score - - - - - - 0    Fall Risk Fall Risk  04/28/2021 03/21/2021 03/15/2021 04/27/2020 04/27/2019  Falls in the past year? 0 0 0 0 0  Number falls in past yr: 0 0 0 - -  Injury with Fall? 0 0 0 - -  Risk for fall due to : - - No Fall Risks - -  Follow up Falls evaluation completed Falls evaluation completed Falls evaluation completed Falls evaluation  completed -    FALL RISK PREVENTION PERTAINING TO THE HOME: Handrails in use when climbing stairs? Yes Home free of loose throw rugs in walkways, pet beds, electrical cords, etc? Yes  Adequate  lighting in your home to reduce risk of falls? Yes   ASSISTIVE DEVICES UTILIZED TO PREVENT FALLS: Life alert? No  Use of a cane, walker or w/c? No   TIMED UP AND GO: Was the test performed? No .   Cognitive Function: Patient is alert and oriented x3.  MMSE/6CIT deferred. Normal by direct communication/observation.  Denies difficulty focusing, making decisions, memory loss.  MMSE - Mini Mental State Exam 04/27/2020  Not completed: Unable to complete     6CIT Screen 04/27/2019  What Year? 0 points  What month? 0 points  What time? 0 points  Count back from 20 0 points  Months in reverse 0 points  Repeat phrase 0 points  Total Score 0    Immunizations Immunization History  Administered Date(s) Administered  . Fluad Quad(high Dose 65+) 09/15/2020  . Influenza Split 11/14/2011, 10/02/2014  . Influenza, High Dose Seasonal PF 09/26/2018, 10/03/2019  . Influenza-Unspecified 10/22/2013, 08/26/2015, 10/03/2016  . Moderna SARS-COV2 Booster Vaccination 12/30/2020  . PFIZER(Purple Top)SARS-COV-2 Vaccination 03/14/2020, 04/06/2020  . Pneumococcal Conjugate-13 08/21/2017  . Pneumococcal Polysaccharide-23 11/05/2013, 12/27/2019  . Tdap 05/14/2011, 07/08/2017  . Zoster 10/25/2011  . Zoster Recombinat (Shingrix) 11/15/2017, 03/04/2018   Health Maintenance There are no preventive care reminders to display for this patient. Health Maintenance  Topic Date Due  . COVID-19 Vaccine (4 - Booster) 06/29/2021  . INFLUENZA VACCINE  07/24/2021  . COLONOSCOPY (Pts 45-34yrs Insurance coverage will need to be confirmed)  02/05/2022  . MAMMOGRAM  01/24/2023  . TETANUS/TDAP  07/09/2027  . DEXA SCAN  Completed  . Hepatitis C Screening  Completed  . PNA vac Low Risk Adult  Completed  . HPV VACCINES  Aged Out   Colorectal cancer screening: Type of screening: Colonoscopy. Completed 02/06/12. Repeat every 10 years   Mammogram status: Completed 01/24/21. Repeat every year  Lung Cancer Screening: (Low  Dose CT Chest recommended if Age 49-80 years, 30 pack-year currently smoking OR have quit w/in 15years.) does not qualify.   Vision Screening: Recommended annual ophthalmology exams for early detection of glaucoma and other disorders of the eye. Is the patient up to date with their annual eye exam?  Yes   Dental Screening: Recommended annual dental exams for proper oral hygiene.  Community Resource Referral / Chronic Care Management: CRR required this visit?  No   CCM required this visit?  No      Plan:   Keep all routine maintenance appointments.   Follow up 07/17/21 @ 10:30  I have personally reviewed and noted the following in the patient's chart:   . Medical and social history . Use of alcohol, tobacco or illicit drugs  . Current medications and supplements including opioid prescriptions.  . Functional ability and status . Nutritional status . Physical activity . Advanced directives . List of other physicians . Hospitalizations, surgeries, and ER visits in previous 12 months . Vitals . Screenings to include cognitive, depression, and falls . Referrals and appointments  In addition, I have reviewed and discussed with patient certain preventive protocols, quality metrics, and best practice recommendations. A written personalized care plan for preventive services as well as general preventive health recommendations were provided to patient via mychart.     Varney Biles, LPN   06/27/1024

## 2021-05-26 DIAGNOSIS — H35711 Central serous chorioretinopathy, right eye: Secondary | ICD-10-CM | POA: Diagnosis not present

## 2021-05-26 DIAGNOSIS — H353210 Exudative age-related macular degeneration, right eye, stage unspecified: Secondary | ICD-10-CM | POA: Diagnosis not present

## 2021-06-02 DIAGNOSIS — H35711 Central serous chorioretinopathy, right eye: Secondary | ICD-10-CM | POA: Diagnosis not present

## 2021-06-02 DIAGNOSIS — H2513 Age-related nuclear cataract, bilateral: Secondary | ICD-10-CM | POA: Diagnosis not present

## 2021-06-05 DIAGNOSIS — H3589 Other specified retinal disorders: Secondary | ICD-10-CM | POA: Diagnosis not present

## 2021-06-06 ENCOUNTER — Telehealth: Payer: Self-pay | Admitting: Internal Medicine

## 2021-06-06 ENCOUNTER — Encounter: Payer: Self-pay | Admitting: Internal Medicine

## 2021-06-06 NOTE — Telephone Encounter (Signed)
FYI--please advise. 

## 2021-06-06 NOTE — Telephone Encounter (Signed)
Patient stated that she has mainly just having a very runny nose & a mild cough. Symptoms started yesterday & she feels that she has a bad cold. She has also sent you a long mychart that I will forward. I have her scheduled Sharyn Lull for VV tomorrow @ 1:30p.

## 2021-06-06 NOTE — Telephone Encounter (Signed)
Patient advised & scheduled in your 4p Thursday for VV with you to discuss CSR.

## 2021-06-06 NOTE — Telephone Encounter (Signed)
Reviewed my chart and phone message.  Agree with appt soon for evaluation of covid - to see what treatment warranted for covid.  Regarding her other issues, I can work her in to discuss.  If she wants me to also see her this week, I can work her in Thursday or Friday of this week - virtual visit, but does need to be seen for covid issues tomorrow.

## 2021-06-06 NOTE — Telephone Encounter (Signed)
See phone note.  Pt has appt with Sharyn Lull tomorrow and with me 06/08/21.

## 2021-06-06 NOTE — Telephone Encounter (Signed)
Pt stated that she tested positive for coivd today and is having mid symptoms  She didn't want to schedule a VV just wanted something called in or just to make Dr. Nicki Reaper aware that she tested positive  Pt was made aware that without an appt that we wouldn't be able to call anything in    Pt also wanted to discuss her CSR diagnosis

## 2021-06-07 ENCOUNTER — Telehealth: Payer: PPO | Admitting: Adult Health

## 2021-06-07 DIAGNOSIS — H3589 Other specified retinal disorders: Secondary | ICD-10-CM | POA: Insufficient documentation

## 2021-06-08 ENCOUNTER — Telehealth (INDEPENDENT_AMBULATORY_CARE_PROVIDER_SITE_OTHER): Payer: PPO | Admitting: Internal Medicine

## 2021-06-08 ENCOUNTER — Other Ambulatory Visit: Payer: Self-pay

## 2021-06-08 DIAGNOSIS — J4520 Mild intermittent asthma, uncomplicated: Secondary | ICD-10-CM | POA: Diagnosis not present

## 2021-06-08 DIAGNOSIS — K219 Gastro-esophageal reflux disease without esophagitis: Secondary | ICD-10-CM

## 2021-06-08 DIAGNOSIS — F419 Anxiety disorder, unspecified: Secondary | ICD-10-CM

## 2021-06-08 DIAGNOSIS — H35719 Central serous chorioretinopathy, unspecified eye: Secondary | ICD-10-CM | POA: Diagnosis not present

## 2021-06-08 DIAGNOSIS — U071 COVID-19: Secondary | ICD-10-CM

## 2021-06-08 MED ORDER — LORAZEPAM 0.5 MG PO TABS: ORAL_TABLET | ORAL | 0 refills | Status: DC

## 2021-06-08 NOTE — Progress Notes (Signed)
[name], female   DOB: September 13, 1951, 70 y.o.   MRN: [id number]   Virtual Visit via video Note  This visit type was conducted due to national recommendations for restrictions regarding the COVID-19 pandemic (e.g. social distancing).  This format is felt to be most appropriate for this patient at this time.  All issues noted in this document were discussed and addressed.  No physical exam was performed (except for noted visual exam findings with Video Visits).   I connected with Marthe Dant today by a video enabled telemedicine application and verified that I am speaking with the correct person using two identifiers. Location patient: home Location provider: work  Persons participating in the virtual visit: patient, provider  The limitations, risks, security and privacy concerns of performing an evaluation and management service by video and the availability of in person appointments have been discussed.  It has also been discussed with the patient that there may be a patient responsible charge related to this service. The patient expressed understanding and agreed to proceed.   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  Was exposed Saturday, June 11.  First noticed symptoms Monday 613.  Noticed minimal cough.  The following day states she felt "like she was hit by a truck".  Developed flulike symptoms.  Developed runny nose, headache, scratchy throat and cough.  Had a low-grade fever.  Temperature 99+.  Some decreased appetite.  Slept that day and the following day.  Headache is better today.  Took Mucinex.  Pulse ox 98 to 99% on room air.  Denies any chest tightness, chest pain or shortness of breath.  Denies any nausea vomiting or diarrhea.  Able to eat and drink.  Recently having problems with her eyes as well.  Has seen a retina specialist at [facility] (Dr. Lemmie Evens).  Has been diagnosed with central serous retinopathy.  She has subsequently been referred  to a specialist.  Waiting to get an appointment.  (Dr. Lossie Faes).  Informs me that she can no longer have any form of steroids.  Increased stress related to this.  Requests refill on her lorazepam.  Discussed.  Does not feel she needs any further intervention at this point.  Has good support.   ROS: See pertinent positives and negatives per HPI.  Past Medical History:  Diagnosis Date   Basal cell carcinoma    multiple removed/[facility]   Breast cancer ([facility]) 12/13   s/p right lumpectomy, s/p chemo and XRT   DES exposure in utero    Hepatitis A    History of chicken pox    Hyperactive airway disease    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HCC)    Migraines    Miscarriage    x3    Past Surgical History:  Procedure Laterality Date   BASAL CELL CARCINOMA EXCISION     Multiple   BREAST BIOPSY  1975   Cyst   BREAST LUMPECTOMY WITH AXILLARY LYMPH NODE DISSECTION  01/06/13, 01/22/13   Dr. Drue Dun, [facility], second surgery for pos margin   CERVICAL FUSION  2001   COLONOSCOPY     DILATION AND CURETTAGE OF UTERUS     x 3   LAPAROSCOPIC BILATERAL SALPINGO OOPHERECTOMY Bilateral 12/21/2020   Procedure: LAPAROSCOPIC BILATERAL SALPINGO OOPHORECTOMY;  Surgeon: Ward, Honor Loh, MD;  Location: [facility] OR[facility];  Service: Gynecology;  Laterality: Bilateral;   VAGINAL DELIVERY     x4   WISDOM TOOTH EXTRACTION  Family History  Problem Relation Age of Onset   Pancreatic cancer Mother    Cancer Mother        Pancreatic   Arthritis Mother    Hyperlipidemia Mother    Hypertension Mother    Heart attack Father    Hyperlipidemia Father    Hypertension Father    Heart disease Father 38   Colon cancer Other    Cancer Other 74       Breast and Ovarian- dx 50's/Colon   Birth defects Maternal Aunt        Breast - in 69's   Stroke Maternal Grandmother    Birth defects Maternal Aunt        Breast - in 38's    SOCIAL HX: reviewed.   Current Outpatient Medications:    albuterol (VENTOLIN  HFA) 108 (90 Base) MCG/ACT inhaler, Inhale 2 puffs into the lungs every 6 (six) hours as needed for wheezing or shortness of breath., Disp: 18 g, Rfl: 0   BIOTIN PO, Take 10,000 mg by mouth daily. , Disp: , Rfl:    butalbital-aspirin-caffeine (FIORINAL) 50-325-40 MG capsule, TAKE 1 CAPSULE BY MOUTH TWICE DAILY AS NEEDED FOR HEADACHE, Disp: 30 capsule, Rfl: 0   Cholecalciferol (VITAMIN D3) 125 MCG (5000 UT) CAPS, Take 5,000 Units by mouth daily., Disp: , Rfl:    Glucosamine-Chondroitin (MOVE FREE PO), Take 1 capsule by mouth daily. Cartilage/boron/hyalauronic acid, Disp: , Rfl:    hydrOXYzine (ATARAX/VISTARIL) 10 MG tablet, TAKE ONE TABLET BY MOUTH EVERY DAY AS NEEDED FOR ITCHING, Disp: 30 tablet, Rfl: 1   ipratropium (ATROVENT) 0.06 % nasal spray, SPRAY TWICE INTO EACH NOSTRIL TWICE DAILY, Disp: 30 mL, Rfl: 2   MAGNESIUM BISGLYCINATE PO, Take 200 mg by mouth daily., Disp: , Rfl:    Multiple Minerals-Vitamins (CALCIUM CITRATE PLUS/MAGNESIUM PO), Take 2 tablets by mouth 2 (two) times daily. CAL CITRATE 500-MAGNESIUM 80-ZINC-10, Disp: , Rfl:    Multiple Vitamins-Minerals (MULTIVITAMIN WOMENS 50+ ADV PO), Take 1 tablet by mouth daily., Disp: , Rfl:    Omega-3 Fatty Acids (OMEGA-3 FISH OIL PO), Take 1,280 mg by mouth 2 (two) times daily., Disp: , Rfl:    pantoprazole (PROTONIX) 20 MG tablet, TAKE ONE TABLET BY MOUTH TWICE DAILY BEFORE A MEAL, Disp: 180 tablet, Rfl: 1   propranolol (INDERAL) 40 MG tablet, TAKE 1 TABLET BY MOUTH DAILY, Disp: 90 tablet, Rfl: 1   Riboflavin (VITAMIN B2 PO), Take 250 mg by mouth daily., Disp: , Rfl:    TURMERIC CURCUMIN PO, Take 2,250 mg by mouth in the morning and at bedtime., Disp: , Rfl:    zolpidem (AMBIEN) 5 MG tablet, Take 1 tablet (5 mg total) by mouth at bedtime as needed., Disp: 30 tablet, Rfl: 0   LORazepam (ATIVAN) 0.5 MG tablet, TAKE ONE TABLET EVERY DAY AS NEEDED FOR ANXIETY, Disp: 30 tablet, Rfl: 0  EXAM:  VITALS per patient if applicable: 56-43%  GENERAL:  alert, oriented, appears well and in no acute distress  HEENT: atraumatic, conjunttiva clear, no obvious abnormalities on inspection of external nose and ears  NECK: normal movements of the head and neck  LUNGS: on inspection no signs of respiratory distress, breathing rate appears normal, no obvious gross SOB, gasping or wheezing  CV: no obvious cyanosis  PSYCH/NEURO: pleasant and cooperative, no obvious depression or anxiety, speech and thought processing grossly intact  ASSESSMENT AND PLAN:  Discussed the following assessment and plan:  Problem List Items Addressed This Visit  Anxiety    Stress as outlined.  Discussed.  Following with ophthalmology.  Refilled lorazepam.        Relevant Medications   LORazepam (ATIVAN) 0.5 MG tablet   Asthma    Breathing stable.  No increase shortness of breath chest pain chest tightness or wheezing.  Follow-up.       Central serous retinopathy    New diagnosis.  Being followed by ophthalmology.        COVID-19 virus infection    Recent diagnosis of covid as outlined.  Symptoms started 06/05/21.  No chest pain or sob.  No chest tightness.  Saline nasal spray as outlined.  Mucinex.  Rest.  Fluids.  Avoid steroids as outlined.  Discussed oral antivirals.  She called back with request to start.  rx sent in for molnupiravir.  Discussed possible side effects of medication.  Discussed FDA has approved based on emergency use authorization.  Follow.  Call with update.         GERD (gastroesophageal reflux disease)    No upper symptoms reported. On protonix.         Return if symptoms worsen or fail to improve.   I discussed the assessment and treatment plan with the patient. The patient was provided an opportunity to ask questions and all were answered. The patient agreed with the plan and demonstrated an understanding of the instructions.   The patient was advised to call back or seek an in-person evaluation if the symptoms worsen or  if the condition fails to improve as anticipated.   Einar Pheasant, MD

## 2021-06-09 ENCOUNTER — Telehealth: Payer: Self-pay | Admitting: Internal Medicine

## 2021-06-09 MED ORDER — MOLNUPIRAVIR EUA 200MG CAPSULE: 4.0000 | ORAL_CAPSULE | Freq: Two times a day (BID) | ORAL | 0 refills | Status: AC

## 2021-06-09 NOTE — Telephone Encounter (Signed)
Please call Ms Falin this am and see how she is doing.  She had wanted to hold on the oral antiviral medication yesterday.  See how she is doing this am and let me know if she needs anything.

## 2021-06-09 NOTE — Telephone Encounter (Signed)
Confirmed with [facility] that they do have prescription. Talked to patient and she is going to start today.

## 2021-06-09 NOTE — Telephone Encounter (Signed)
Patient stated she still feels pretty rough. She said that HA was gone, but still very fatigued with cold symptoms. She said that she is still unsure about antiviral medication, but if you still thought appropriate that she would try it. She feels that still her worse days were Tuesday & Wed. Please advise?

## 2021-06-09 NOTE — Telephone Encounter (Signed)
I sent in rx to [facility] for molnupiravir.  She will need to get rx today and start if going to take.  Need to confirm pharmacy can fill the medication.

## 2021-06-09 NOTE — Addendum Note (Signed)
Addended by: Alisa Graff on: 06/09/2021 01:21 PM   Modules accepted: Orders

## 2021-06-18 ENCOUNTER — Encounter: Payer: Self-pay | Admitting: Internal Medicine

## 2021-06-18 DIAGNOSIS — H35719 Central serous chorioretinopathy, unspecified eye: Secondary | ICD-10-CM | POA: Insufficient documentation

## 2021-06-18 DIAGNOSIS — U071 COVID-19: Secondary | ICD-10-CM | POA: Insufficient documentation

## 2021-06-18 NOTE — Assessment & Plan Note (Signed)
Recent diagnosis of covid as outlined.  Symptoms started 06/05/21.  No chest pain or sob.  No chest tightness.  Saline nasal spray as outlined.  Mucinex.  Rest.  Fluids.  Avoid steroids as outlined.  Discussed oral antivirals.  She called back with request to start.  rx sent in for molnupiravir.  Discussed possible side effects of medication.  Discussed FDA has approved based on emergency use authorization.  Follow.  Call with update.

## 2021-06-18 NOTE — Assessment & Plan Note (Signed)
No upper symptoms reported.  On protonix.   

## 2021-06-18 NOTE — Assessment & Plan Note (Signed)
Stress as outlined.  Discussed.  Following with ophthalmology.  Refilled lorazepam.

## 2021-06-18 NOTE — Assessment & Plan Note (Signed)
Breathing stable.  No increase shortness of breath chest pain chest tightness or wheezing.  Follow-up.

## 2021-06-18 NOTE — Assessment & Plan Note (Signed)
New diagnosis.  Being followed by ophthalmology.

## 2021-07-03 DIAGNOSIS — D225 Melanocytic nevi of trunk: Secondary | ICD-10-CM | POA: Diagnosis not present

## 2021-07-03 DIAGNOSIS — L821 Other seborrheic keratosis: Secondary | ICD-10-CM | POA: Diagnosis not present

## 2021-07-03 DIAGNOSIS — L570 Actinic keratosis: Secondary | ICD-10-CM | POA: Diagnosis not present

## 2021-07-03 DIAGNOSIS — L910 Hypertrophic scar: Secondary | ICD-10-CM | POA: Diagnosis not present

## 2021-07-03 DIAGNOSIS — D2271 Melanocytic nevi of right lower limb, including hip: Secondary | ICD-10-CM | POA: Diagnosis not present

## 2021-07-03 DIAGNOSIS — X32XXXA Exposure to sunlight, initial encounter: Secondary | ICD-10-CM | POA: Diagnosis not present

## 2021-07-03 DIAGNOSIS — D485 Neoplasm of uncertain behavior of skin: Secondary | ICD-10-CM | POA: Diagnosis not present

## 2021-07-03 DIAGNOSIS — Z85828 Personal history of other malignant neoplasm of skin: Secondary | ICD-10-CM | POA: Diagnosis not present

## 2021-07-03 DIAGNOSIS — Z08 Encounter for follow-up examination after completed treatment for malignant neoplasm: Secondary | ICD-10-CM | POA: Diagnosis not present

## 2021-07-10 NOTE — Telephone Encounter (Signed)
error 

## 2021-07-11 DIAGNOSIS — H539 Unspecified visual disturbance: Secondary | ICD-10-CM | POA: Diagnosis not present

## 2021-07-11 DIAGNOSIS — Z736 Limitation of activities due to disability: Secondary | ICD-10-CM | POA: Diagnosis not present

## 2021-07-11 DIAGNOSIS — H53411 Scotoma involving central area, right eye: Secondary | ICD-10-CM | POA: Diagnosis not present

## 2021-07-11 DIAGNOSIS — H40033 Anatomical narrow angle, bilateral: Secondary | ICD-10-CM | POA: Diagnosis not present

## 2021-07-11 DIAGNOSIS — H35711 Central serous chorioretinopathy, right eye: Secondary | ICD-10-CM | POA: Diagnosis not present

## 2021-07-12 DIAGNOSIS — X32XXXA Exposure to sunlight, initial encounter: Secondary | ICD-10-CM | POA: Diagnosis not present

## 2021-07-12 DIAGNOSIS — L570 Actinic keratosis: Secondary | ICD-10-CM | POA: Diagnosis not present

## 2021-07-17 ENCOUNTER — Ambulatory Visit: Payer: PPO | Admitting: Internal Medicine

## 2021-07-19 DIAGNOSIS — Z736 Limitation of activities due to disability: Secondary | ICD-10-CM | POA: Diagnosis not present

## 2021-07-19 DIAGNOSIS — H539 Unspecified visual disturbance: Secondary | ICD-10-CM | POA: Diagnosis not present

## 2021-07-19 DIAGNOSIS — H53411 Scotoma involving central area, right eye: Secondary | ICD-10-CM | POA: Diagnosis not present

## 2021-07-19 DIAGNOSIS — H35711 Central serous chorioretinopathy, right eye: Secondary | ICD-10-CM | POA: Diagnosis not present

## 2021-07-24 DIAGNOSIS — H3589 Other specified retinal disorders: Secondary | ICD-10-CM | POA: Diagnosis not present

## 2021-08-02 ENCOUNTER — Other Ambulatory Visit: Payer: Self-pay | Admitting: Internal Medicine

## 2021-08-08 DIAGNOSIS — H52223 Regular astigmatism, bilateral: Secondary | ICD-10-CM | POA: Diagnosis not present

## 2021-08-08 DIAGNOSIS — H524 Presbyopia: Secondary | ICD-10-CM | POA: Diagnosis not present

## 2021-08-08 DIAGNOSIS — H5203 Hypermetropia, bilateral: Secondary | ICD-10-CM | POA: Diagnosis not present

## 2021-08-08 DIAGNOSIS — H35711 Central serous chorioretinopathy, right eye: Secondary | ICD-10-CM | POA: Diagnosis not present

## 2021-08-15 ENCOUNTER — Other Ambulatory Visit: Payer: Self-pay

## 2021-08-15 ENCOUNTER — Ambulatory Visit (INDEPENDENT_AMBULATORY_CARE_PROVIDER_SITE_OTHER): Payer: PPO | Admitting: Internal Medicine

## 2021-08-15 ENCOUNTER — Telehealth: Payer: Self-pay | Admitting: Internal Medicine

## 2021-08-15 VITALS — BP 120/70 | HR 70 | Temp 97.9°F | Resp 16 | Ht 69.0 in | Wt 146.0 lb

## 2021-08-15 DIAGNOSIS — K219 Gastro-esophageal reflux disease without esophagitis: Secondary | ICD-10-CM

## 2021-08-15 DIAGNOSIS — Z13220 Encounter for screening for lipoid disorders: Secondary | ICD-10-CM | POA: Diagnosis not present

## 2021-08-15 DIAGNOSIS — G43809 Other migraine, not intractable, without status migrainosus: Secondary | ICD-10-CM | POA: Diagnosis not present

## 2021-08-15 DIAGNOSIS — H35719 Central serous chorioretinopathy, unspecified eye: Secondary | ICD-10-CM | POA: Diagnosis not present

## 2021-08-15 DIAGNOSIS — F419 Anxiety disorder, unspecified: Secondary | ICD-10-CM

## 2021-08-15 DIAGNOSIS — Z853 Personal history of malignant neoplasm of breast: Secondary | ICD-10-CM | POA: Diagnosis not present

## 2021-08-15 DIAGNOSIS — J4520 Mild intermittent asthma, uncomplicated: Secondary | ICD-10-CM

## 2021-08-15 DIAGNOSIS — R7989 Other specified abnormal findings of blood chemistry: Secondary | ICD-10-CM | POA: Diagnosis not present

## 2021-08-15 DIAGNOSIS — L309 Dermatitis, unspecified: Secondary | ICD-10-CM | POA: Diagnosis not present

## 2021-08-15 MED ORDER — SERTRALINE HCL 50 MG PO TABS: ORAL_TABLET | ORAL | 1 refills | Status: DC

## 2021-08-15 MED ORDER — BUTALBITAL-ASPIRIN-CAFFEINE 50-325-40 MG PO CAPS: 1.0000 | ORAL_CAPSULE | Freq: Two times a day (BID) | ORAL | 0 refills | Status: DC | PRN

## 2021-08-15 NOTE — Progress Notes (Signed)
Patient ID: LAKIETA MAKEY, female   DOB: 24-May-1951, 70 y.o.   MRN: [id number]   Subjective:    Patient ID: [name], female    DOB: 02/15/1951, 70 y.o.   MRN: [id number]  HPI This visit occurred during the SARS-CoV-2 public health emergency.  Safety protocols were in place, including screening questions prior to the visit, additional usage of staff PPE, and extensive cleaning of exam room while observing appropriate contact time as indicated for disinfecting solutions.   Patient here for a scheduled follow up.  Here to follow up regarding increased stress.  Discussed.  Increased stress related to her underlying eye issues and family stress.  Feels she needs something just to help level things out.  Has lorazepam, but rarely takes and prefers not to take.  Tries to stay active.  No chest pain or sob with increased activity or exertion.  No acid reflux reported.  No abdominal pain.  Bowels moving.  Blood pressure doing well.  .  Past Medical History:  Diagnosis Date   Basal cell carcinoma    multiple removed/[facility]   Breast cancer ([facility]) 12/13   s/p right lumpectomy, s/p chemo and XRT   DES exposure in utero    Hepatitis A    History of chicken pox    Hyperactive airway disease    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HCC)    Migraines    Miscarriage    x3   Past Surgical History:  Procedure Laterality Date   BASAL CELL CARCINOMA EXCISION     Multiple   BREAST BIOPSY  1975   Cyst   BREAST LUMPECTOMY WITH AXILLARY LYMPH NODE DISSECTION  01/06/13, 01/22/13   Dr. Drue Dun, [facility], second surgery for pos margin   CERVICAL FUSION  2001   COLONOSCOPY     DILATION AND CURETTAGE OF UTERUS     x 3   LAPAROSCOPIC BILATERAL SALPINGO OOPHERECTOMY Bilateral 12/21/2020   Procedure: LAPAROSCOPIC BILATERAL SALPINGO OOPHORECTOMY;  Surgeon: Ward, Honor Loh, MD;  Location: [facility];  Service: Gynecology;  Laterality: Bilateral;   VAGINAL DELIVERY     x4   WISDOM TOOTH EXTRACTION      Family History  Problem Relation Age of Onset   Pancreatic cancer Mother    Cancer Mother        Pancreatic   Arthritis Mother    Hyperlipidemia Mother    Hypertension Mother    Heart attack Father    Hyperlipidemia Father    Hypertension Father    Heart disease Father 48   Colon cancer Other    Cancer Other 87       Breast and Ovarian- dx 50's/Colon   Birth defects Maternal Aunt        Breast - in 27's   Stroke Maternal Grandmother    Birth defects Maternal Aunt        Breast - in 57's   Social History   Socioeconomic History   Marital status: Married    Spouse name: ED   Number of children: 4   Years of education: Not on file   Highest education level: Not on file  Occupational History   Occupation: Corporate treasurer - [employer]  Tobacco Use   Smoking status: Never   Smokeless tobacco: Never  Substance and Sexual Activity   Alcohol use: Yes    Alcohol/week: 0.0 standard drinks    Comment: 3 times a week   Drug use: No   Sexual activity: Not on  file  Other Topics Concern   Not on file  Social History Narrative   Lives in [place] with husband.  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    Social Determinants of Health   Financial Resource Strain: Low Risk    Difficulty of Paying Living Expenses: Not hard at all  Food Insecurity: No Food Insecurity   Worried About Charity fundraiser in the Last Year: Never true   [place] in the Last Year: Never true  Transportation Needs: No Transportation Needs   Lack of Transportation (Medical): No   Lack of Transportation (Non-Medical): No  Physical Activity: Sufficiently Active   Days of Exercise per Week: 5 days   Minutes of Exercise per Session: 40 min  Stress: No Stress Concern Present   Feeling of Stress : Only a little  Social Connections: Unknown   Frequency of Communication with Friends and Family: More than three times a week   Frequency of Social Gatherings with Friends and Family: More than three times a week    Attends Religious Services: Not on Electrical engineer or Organizations: Not on file   Attends Archivist Meetings: Not on file   Marital Status: Not on file    Review of Systems  Constitutional:  Negative for appetite change and unexpected weight change.  HENT:  Negative for congestion and sinus pressure.   Respiratory:  Negative for cough, chest tightness and shortness of breath.   Cardiovascular:  Negative for chest pain, palpitations and leg swelling.  Gastrointestinal:  Negative for abdominal pain, diarrhea, nausea and vomiting.  Genitourinary:  Negative for difficulty urinating and dysuria.  Musculoskeletal:  Negative for joint swelling and myalgias.  Skin:  Negative for color change and rash.  Neurological:  Negative for dizziness, light-headedness and headaches.  Psychiatric/Behavioral:         Increased stress as outlined.  Discussed.        Objective:    Physical Exam Vitals reviewed.  Constitutional:      General: She is not in acute distress.    Appearance: Normal appearance.  HENT:     Head: Normocephalic and atraumatic.     Right Ear: External ear normal.     Left Ear: External ear normal.  Eyes:     General: No scleral icterus.       Right eye: No discharge.        Left eye: No discharge.     Conjunctiva/sclera: Conjunctivae normal.  Neck:     Thyroid: No thyromegaly.  Cardiovascular:     Rate and Rhythm: Normal rate and regular rhythm.  Pulmonary:     Effort: No respiratory distress.     Breath sounds: Normal breath sounds. No wheezing.  Abdominal:     General: Bowel sounds are normal.     Palpations: Abdomen is soft.     Tenderness: There is no abdominal tenderness.  Musculoskeletal:        General: No swelling or tenderness.     Cervical back: Neck supple. No tenderness.  Lymphadenopathy:     Cervical: No cervical adenopathy.  Skin:    Findings: No erythema or rash.  Neurological:     Mental Status: She is alert.   Psychiatric:        Mood and Affect: Mood normal.        Behavior: Behavior normal.    BP 120/70   Pulse 70   Temp 97.9 F (36.6 C)  Resp 16   Ht '5\' 9"'$  (1.753 m)   Wt 146 lb (66.2 kg)   SpO2 98%   BMI 21.56 kg/m  Wt Readings from Last 3 Encounters:  08/15/21 146 lb (66.2 kg)  06/08/21 148 lb (67.1 kg)  04/28/21 148 lb (67.1 kg)    Outpatient Encounter Medications as of 08/15/2021  Medication Sig   sertraline (ZOLOFT) 50 MG tablet 1/2 tablet q day x 1 week and then one per day.   albuterol (VENTOLIN HFA) 108 (90 Base) MCG/ACT inhaler Inhale 2 puffs into the lungs every 6 (six) hours as needed for wheezing or shortness of breath.   BIOTIN PO Take 10,000 mg by mouth daily.    butalbital-aspirin-caffeine (FIORINAL) 50-325-40 MG capsule Take 1 capsule by mouth 2 (two) times daily as needed for headache.   Cholecalciferol (VITAMIN D3) 125 MCG (5000 UT) CAPS Take 5,000 Units by mouth daily.   Glucosamine-Chondroitin (MOVE FREE PO) Take 1 capsule by mouth daily. Cartilage/boron/hyalauronic acid   hydrOXYzine (ATARAX/VISTARIL) 10 MG tablet TAKE ONE TABLET BY MOUTH EVERY DAY AS NEEDED FOR ITCHING   ipratropium (ATROVENT) 0.06 % nasal spray SPRAY TWICE INTO EACH NOSTRIL TWICE DAILY   LORazepam (ATIVAN) 0.5 MG tablet TAKE ONE TABLET EVERY DAY AS NEEDED FOR ANXIETY   MAGNESIUM BISGLYCINATE PO Take 200 mg by mouth daily.   Multiple Minerals-Vitamins (CALCIUM CITRATE PLUS/MAGNESIUM PO) Take 2 tablets by mouth 2 (two) times daily. CAL CITRATE 500-MAGNESIUM 80-ZINC-10   Multiple Vitamins-Minerals (MULTIVITAMIN WOMENS 50+ ADV PO) Take 1 tablet by mouth daily.   Omega-3 Fatty Acids (OMEGA-3 FISH OIL PO) Take 1,280 mg by mouth 2 (two) times daily.   pantoprazole (PROTONIX) 20 MG tablet TAKE ONE TABLET BY MOUTH TWICE DAILY BEFORE A MEAL   propranolol (INDERAL) 40 MG tablet TAKE 1 TABLET BY MOUTH DAILY   Riboflavin (VITAMIN B2 PO) Take 250 mg by mouth daily.   TURMERIC CURCUMIN PO Take 2,250 mg by  mouth in the morning and at bedtime.   zolpidem (AMBIEN) 5 MG tablet Take 1 tablet (5 mg total) by mouth at bedtime as needed.   [DISCONTINUED] butalbital-aspirin-caffeine (FIORINAL) 50-325-40 MG capsule TAKE 1 CAPSULE BY MOUTH TWICE DAILY AS NEEDED FOR HEADACHE   No facility-administered encounter medications on file as of 08/15/2021.     Lab Results  Component Value Date   WBC 9.4 02/14/2021   HGB 14.0 02/14/2021   HCT 40.1 02/14/2021   PLT 285.0 02/14/2021   GLUCOSE 96 03/15/2021   CHOL 169 03/15/2021   TRIG 51.0 03/15/2021   HDL 66.40 03/15/2021   LDLCALC 92 03/15/2021   ALT 15 03/15/2021   AST 18 03/15/2021   NA 140 03/15/2021   K 4.0 03/15/2021   CL 106 03/15/2021   CREATININE 0.83 03/15/2021   BUN 22 03/15/2021   CO2 27 03/15/2021   TSH 3.28 11/30/2020   MICROALBUR <0.7 11/14/2015       Assessment & Plan:   Problem List Items Addressed This Visit     Anxiety    Increased stress as outlined.  Discussed.  Discussed treatment options.  Feels she needs something to help level things out.  Start zoloft as directed.  Follow closely.  Get her back in soon to reassess.       Relevant Medications   sertraline (ZOLOFT) 50 MG tablet   Other Relevant Orders   CBC with Differential/Platelet   Comprehensive metabolic panel   Asthma    Breathing stable.  Central serous retinopathy    Being followed by ophthalmology.        GERD (gastroesophageal reflux disease)    No upper symptoms reported. On protonix.       History of breast cancer    Mammogram 01/24/21 - Birads II.       Migraines    Overall stable.  Follow.       Relevant Medications   butalbital-aspirin-caffeine (FIORINAL) 50-325-40 MG capsule   sertraline (ZOLOFT) 50 MG tablet   Other Visit Diagnoses     Screening cholesterol level    -  Primary   Relevant Orders   Lipid panel   Elevated TSH       Relevant Orders   TSH        Einar Pheasant, MD

## 2021-08-15 NOTE — Telephone Encounter (Signed)
Per your checkout notes you have down that you want Patient back in 6 wks and labs 2 days before then. Nothing is available 6 weeks from now. Please advise as to how to address this as PT wants to make sure before scheduling anything for your next available.

## 2021-08-16 NOTE — Telephone Encounter (Signed)
Patient has been scheduled

## 2021-08-20 ENCOUNTER — Encounter: Payer: Self-pay | Admitting: Internal Medicine

## 2021-08-20 NOTE — Assessment & Plan Note (Signed)
Being followed by ophthalmology.   

## 2021-08-20 NOTE — Assessment & Plan Note (Signed)
Mammogram 01/24/21 - Birads II.

## 2021-08-20 NOTE — Assessment & Plan Note (Signed)
No upper symptoms reported.  On protonix.   

## 2021-08-20 NOTE — Assessment & Plan Note (Signed)
Overall stable.  Follow.

## 2021-08-20 NOTE — Assessment & Plan Note (Signed)
Increased stress as outlined.  Discussed.  Discussed treatment options.  Feels she needs something to help level things out.  Start zoloft as directed.  Follow closely.  Get her back in soon to reassess.

## 2021-08-20 NOTE — Assessment & Plan Note (Signed)
Breathing stable.

## 2021-08-22 DIAGNOSIS — H3554 Dystrophies primarily involving the retinal pigment epithelium: Secondary | ICD-10-CM | POA: Diagnosis not present

## 2021-08-22 DIAGNOSIS — H40033 Anatomical narrow angle, bilateral: Secondary | ICD-10-CM | POA: Diagnosis not present

## 2021-08-22 DIAGNOSIS — H2513 Age-related nuclear cataract, bilateral: Secondary | ICD-10-CM | POA: Diagnosis not present

## 2021-09-19 DIAGNOSIS — H40033 Anatomical narrow angle, bilateral: Secondary | ICD-10-CM | POA: Diagnosis not present

## 2021-09-22 ENCOUNTER — Other Ambulatory Visit: Payer: Self-pay | Admitting: Internal Medicine

## 2021-09-28 ENCOUNTER — Other Ambulatory Visit: Payer: Self-pay | Admitting: Internal Medicine

## 2021-10-03 ENCOUNTER — Other Ambulatory Visit (INDEPENDENT_AMBULATORY_CARE_PROVIDER_SITE_OTHER): Payer: PPO

## 2021-10-03 ENCOUNTER — Other Ambulatory Visit: Payer: Self-pay

## 2021-10-03 DIAGNOSIS — Z13220 Encounter for screening for lipoid disorders: Secondary | ICD-10-CM | POA: Diagnosis not present

## 2021-10-03 DIAGNOSIS — R7989 Other specified abnormal findings of blood chemistry: Secondary | ICD-10-CM | POA: Diagnosis not present

## 2021-10-03 DIAGNOSIS — F419 Anxiety disorder, unspecified: Secondary | ICD-10-CM

## 2021-10-03 LAB — COMPREHENSIVE METABOLIC PANEL
ALT: 14 U/L (ref 0–35)
AST: 19 U/L (ref 0–37)
Albumin: 4.4 g/dL (ref 3.5–5.2)
Alkaline Phosphatase: 63 U/L (ref 39–117)
BUN: 14 mg/dL (ref 6–23)
CO2: 28 mEq/L (ref 19–32)
Calcium: 9.5 mg/dL (ref 8.4–10.5)
Chloride: 105 mEq/L (ref 96–112)
Creatinine, Ser: 0.88 mg/dL (ref 0.40–1.20)
GFR: 67.07 mL/min (ref >60.00)
Glucose, Bld: 96 mg/dL (ref 70–99)
Potassium: 3.6 mEq/L (ref 3.5–5.1)
Sodium: 141 mEq/L (ref 135–145)
Total Bilirubin: 0.6 mg/dL (ref 0.2–1.2)
Total Protein: 6.7 g/dL (ref 6.0–8.3)

## 2021-10-03 LAB — CBC WITH DIFFERENTIAL/PLATELET
Basophils Absolute: 0.1 10*3/uL (ref 0.0–0.1)
Basophils Relative: 1.6 % (ref 0.0–3.0)
Eosinophils Absolute: 0.1 10*3/uL (ref 0.0–0.7)
Eosinophils Relative: 1.2 % (ref 0.0–5.0)
HCT: 39.1 % (ref 36.0–46.0)
Hemoglobin: 13.4 g/dL (ref 12.0–15.0)
Lymphocytes Relative: 32.5 % (ref 12.0–46.0)
Lymphs Abs: 1.9 10*3/uL (ref 0.7–4.0)
MCHC: 34.3 g/dL (ref 30.0–36.0)
MCV: 92.1 fl (ref 78.0–100.0)
Monocytes Absolute: 0.6 10*3/uL (ref 0.1–1.0)
Monocytes Relative: 10.1 % (ref 3.0–12.0)
Neutro Abs: 3.1 10*3/uL (ref 1.4–7.7)
Neutrophils Relative %: 54.6 % (ref 43.0–77.0)
Platelets: 269 10*3/uL (ref 150.0–400.0)
RBC: 4.25 Mil/uL (ref 3.87–5.11)
RDW: 12.5 % (ref 11.5–15.5)
WBC: 5.7 10*3/uL (ref 4.0–10.5)

## 2021-10-03 LAB — LIPID PANEL
Cholesterol: 202 mg/dL — ABNORMAL HIGH (ref 0–200)
HDL: 69.3 mg/dL (ref >39.00)
LDL Cholesterol: 116 mg/dL — ABNORMAL HIGH (ref 0–99)
NonHDL: 132.35
Total CHOL/HDL Ratio: 3
Triglycerides: 80 mg/dL (ref 0.0–149.0)
VLDL: 16 mg/dL (ref 0.0–40.0)

## 2021-10-03 LAB — TSH: TSH: 4.08 u[IU]/mL (ref 0.35–5.50)

## 2021-10-05 ENCOUNTER — Ambulatory Visit (INDEPENDENT_AMBULATORY_CARE_PROVIDER_SITE_OTHER): Payer: PPO | Admitting: Internal Medicine

## 2021-10-05 ENCOUNTER — Telehealth: Payer: Self-pay | Admitting: Internal Medicine

## 2021-10-05 ENCOUNTER — Encounter: Payer: Self-pay | Admitting: Internal Medicine

## 2021-10-05 ENCOUNTER — Other Ambulatory Visit: Payer: Self-pay

## 2021-10-05 VITALS — BP 112/68 | HR 60 | Temp 98.0°F | Ht 69.0 in | Wt 145.6 lb

## 2021-10-05 DIAGNOSIS — Z23 Encounter for immunization: Secondary | ICD-10-CM

## 2021-10-05 DIAGNOSIS — G4700 Insomnia, unspecified: Secondary | ICD-10-CM | POA: Diagnosis not present

## 2021-10-05 DIAGNOSIS — E7800 Pure hypercholesterolemia, unspecified: Secondary | ICD-10-CM

## 2021-10-05 DIAGNOSIS — K219 Gastro-esophageal reflux disease without esophagitis: Secondary | ICD-10-CM | POA: Diagnosis not present

## 2021-10-05 DIAGNOSIS — H35719 Central serous chorioretinopathy, unspecified eye: Secondary | ICD-10-CM | POA: Diagnosis not present

## 2021-10-05 DIAGNOSIS — Z853 Personal history of malignant neoplasm of breast: Secondary | ICD-10-CM | POA: Diagnosis not present

## 2021-10-05 DIAGNOSIS — J4520 Mild intermittent asthma, uncomplicated: Secondary | ICD-10-CM | POA: Diagnosis not present

## 2021-10-05 DIAGNOSIS — G43809 Other migraine, not intractable, without status migrainosus: Secondary | ICD-10-CM

## 2021-10-05 MED ORDER — ZOLPIDEM TARTRATE 5 MG PO TABS: 5.0000 mg | ORAL_TABLET | Freq: Every evening | ORAL | 0 refills | Status: DC | PRN

## 2021-10-05 MED ORDER — LORAZEPAM 0.5 MG PO TABS: ORAL_TABLET | ORAL | 0 refills | Status: DC

## 2021-10-05 MED ORDER — SERTRALINE HCL 50 MG PO TABS: 50.0000 mg | ORAL_TABLET | Freq: Every day | ORAL | 2 refills | Status: DC

## 2021-10-05 NOTE — Assessment & Plan Note (Addendum)
The 10-year ASCVD risk score (Arnett DK, et al., 2019) is: 6.3%   Values used to calculate the score:     Age: 70 years     Sex: Female     Is Non-Hispanic African American: No     Diabetic: No     Tobacco smoker: No     Systolic Blood Pressure: 384 mmHg     Is BP treated: No     HDL Cholesterol: 69.3 mg/dL     Total Cholesterol: 202 mg/dL  Low cholesterol diet and exercise.  Follow lipid panel.

## 2021-10-05 NOTE — Telephone Encounter (Signed)
Patient scheduled for labs 01/15/22 as requested by check out note.   Needing orders placed.

## 2021-10-05 NOTE — Telephone Encounter (Signed)
Please advise on what labs you would like ordered?

## 2021-10-05 NOTE — Progress Notes (Signed)
[name], female   DOB: Aug 07, 1951, 70 y.o.   MRN: [id number]   Subjective:    [name], female    DOB: Jan 29, 1951, 70 y.o.   MRN: [id number]  This visit occurred during the SARS-CoV-2 public health emergency.  Safety protocols were in place, including screening questions prior to the visit, additional usage of staff PPE, and extensive cleaning of exam room while observing appropriate contact time as indicated for disinfecting solutions.   Patient here for a scheduled follow up.  Marland Kitchen   HPI Dealing with increased stress.  Overall appears to be handling things relatively well.  Tries to stay active.  No chest pain or sob reported.  No abdominal pain.  Bowels moving.  No acid reflux.  Recent fall.  Scrape left hand.  No head injury.  No residual problems.  Discussed slow position changes and movements.  Discussed labs.     Past Medical History:  Diagnosis Date   Basal cell carcinoma    multiple removed/[facility]   Breast cancer ([place]) 12/13   s/p right lumpectomy, s/p chemo and XRT   DES exposure in utero    Hepatitis A    History of chicken pox    Hyperactive airway disease    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HCC)    Migraines    Miscarriage    x3   Past Surgical History:  Procedure Laterality Date   BASAL CELL CARCINOMA EXCISION     Multiple   BREAST BIOPSY  1975   Cyst   BREAST LUMPECTOMY WITH AXILLARY LYMPH NODE DISSECTION  01/06/13, 01/22/13   Dr. Drue Dun, [facility], second surgery for pos margin   CERVICAL FUSION  2001   COLONOSCOPY     DILATION AND CURETTAGE OF UTERUS     x 3   LAPAROSCOPIC BILATERAL SALPINGO OOPHERECTOMY Bilateral 12/21/2020   Procedure: LAPAROSCOPIC BILATERAL SALPINGO OOPHORECTOMY;  Surgeon: Ward, Honor Loh, MD;  Location: [facility] ORS;  Service: Gynecology;  Laterality: Bilateral;   VAGINAL DELIVERY     x4   WISDOM TOOTH EXTRACTION     Family History  Problem Relation Age of Onset   Pancreatic cancer Mother     Cancer Mother        Pancreatic   Arthritis Mother    Hyperlipidemia Mother    Hypertension Mother    Heart attack Father    Hyperlipidemia Father    Hypertension Father    Heart disease Father 33   Colon cancer Other    Cancer Other 44       Breast and Ovarian- dx 50's/Colon   Birth defects Maternal Aunt        Breast - in 17's   Stroke Maternal Grandmother    Birth defects Maternal Aunt        Breast - in 60's   Social History   Socioeconomic History   Marital status: Married    Spouse name: ED   Number of children: 4   Years of education: Not on file   Highest education level: Not on file  Occupational History   Occupation: Corporate treasurer - [employer]  Tobacco Use   Smoking status: Never   Smokeless tobacco: Never  Substance and Sexual Activity   Alcohol use: Yes    Alcohol/week: 0.0 standard drinks    Comment: 3 times a week   Drug use: No   Sexual activity: Not on file  Other Topics Concern   Not on file  Social History Narrative   Lives in [place] with husband.  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    Social Determinants of Health   Financial Resource Strain: Low Risk    Difficulty of Paying Living Expenses: Not hard at all  Food Insecurity: No Food Insecurity   Worried About Charity fundraiser in the Last Year: Never true   [place] in the Last Year: Never true  Transportation Needs: No Transportation Needs   Lack of Transportation (Medical): No   Lack of Transportation (Non-Medical): No  Physical Activity: Sufficiently Active   Days of Exercise per Week: 5 days   Minutes of Exercise per Session: 40 min  Stress: No Stress Concern Present   Feeling of Stress : Only a little  Social Connections: Unknown   Frequency of Communication with Friends and Family: More than three times a week   Frequency of Social Gatherings with Friends and Family: More than three times a week   Attends Religious Services: Not on Electrical engineer or  Organizations: Not on file   Attends Archivist Meetings: Not on file   Marital Status: Not on file     Review of Systems  Constitutional:  Negative for appetite change and unexpected weight change.  HENT:  Negative for congestion and sinus pressure.   Respiratory:  Negative for cough, chest tightness and shortness of breath.   Cardiovascular:  Negative for chest pain, palpitations and leg swelling.  Gastrointestinal:  Negative for abdominal pain, diarrhea, nausea and vomiting.  Genitourinary:  Negative for difficulty urinating and dysuria.  Musculoskeletal:  Negative for joint swelling and myalgias.  Skin:  Negative for color change and rash.  Neurological:  Negative for dizziness, light-headedness and headaches.  Psychiatric/Behavioral:  Negative for agitation and dysphoric mood.       Objective:     BP 112/68 (BP Location: Left Arm, Patient Position: Sitting, Cuff Size: Normal)   Pulse 60   Temp 98 F (36.7 C) (Oral)   Ht 5\' 9"  (1.753 m)   Wt 145 lb 9.6 oz (66 kg)   SpO2 99%   BMI 21.50 kg/m  Wt Readings from Last 3 Encounters:  10/05/21 145 lb 9.6 oz (66 kg)  08/15/21 146 lb (66.2 kg)  06/08/21 148 lb (67.1 kg)    Physical Exam Vitals reviewed.  Constitutional:      General: She is not in acute distress.    Appearance: Normal appearance.  HENT:     Head: Normocephalic and atraumatic.     Right Ear: External ear normal.     Left Ear: External ear normal.  Eyes:     General: No scleral icterus.       Right eye: No discharge.        Left eye: No discharge.     Conjunctiva/sclera: Conjunctivae normal.  Neck:     Thyroid: No thyromegaly.  Cardiovascular:     Rate and Rhythm: Normal rate and regular rhythm.  Pulmonary:     Effort: No respiratory distress.     Breath sounds: Normal breath sounds. No wheezing.  Abdominal:     General: Bowel sounds are normal.     Palpations: Abdomen is soft.     Tenderness: There is no abdominal tenderness.   Musculoskeletal:        General: No swelling or tenderness.     Cervical back: Neck supple. No tenderness.  Lymphadenopathy:     Cervical: No cervical adenopathy.  Skin:    Findings: No erythema or rash.  Neurological:     Mental Status: She is alert.  Psychiatric:        Mood and Affect: Mood normal.        Behavior: Behavior normal.     Outpatient Encounter Medications as of 10/05/2021  Medication Sig   albuterol (VENTOLIN HFA) 108 (90 Base) MCG/ACT inhaler Inhale 2 puffs into the lungs every 6 (six) hours as needed for wheezing or shortness of breath.   BIOTIN PO Take 10,000 mg by mouth daily.    butalbital-aspirin-caffeine (FIORINAL) 50-325-40 MG capsule Take 1 capsule by mouth 2 (two) times daily as needed for headache.   Cholecalciferol (VITAMIN D3) 125 MCG (5000 UT) CAPS Take 5,000 Units by mouth daily.   Glucosamine-Chondroitin (MOVE FREE PO) Take 1 capsule by mouth daily. Cartilage/boron/hyalauronic acid   hydrOXYzine (ATARAX/VISTARIL) 10 MG tablet TAKE ONE TABLET BY MOUTH EVERY DAY AS NEEDED FOR ITCHING   ipratropium (ATROVENT) 0.06 % nasal spray SPRAY TWICE INTO EACH NOSTRIL TWICE DAILY   MAGNESIUM BISGLYCINATE PO Take 200 mg by mouth daily.   Multiple Minerals-Vitamins (CALCIUM CITRATE PLUS/MAGNESIUM PO) Take 2 tablets by mouth 2 (two) times daily. CAL CITRATE 500-MAGNESIUM 80-ZINC-10   Multiple Vitamins-Minerals (MULTIVITAMIN WOMENS 50+ ADV PO) Take 1 tablet by mouth daily.   Omega-3 Fatty Acids (OMEGA-3 FISH OIL PO) Take 1,280 mg by mouth 2 (two) times daily.   pantoprazole (PROTONIX) 20 MG tablet TAKE ONE TABLET BY MOUTH TWICE DAILY BEFORE A MEAL   propranolol (INDERAL) 40 MG tablet TAKE 1 TABLET BY MOUTH DAILY   Riboflavin (VITAMIN B2 PO) Take 250 mg by mouth daily.   TURMERIC CURCUMIN PO Take 2,250 mg by mouth in the morning and at bedtime.   [DISCONTINUED] LORazepam (ATIVAN) 0.5 MG tablet TAKE ONE TABLET EVERY DAY AS NEEDED FOR ANXIETY   [DISCONTINUED] sertraline  (ZOLOFT) 50 MG tablet 1/2 tablet q day x 1 week and then one per day.   [DISCONTINUED] zolpidem (AMBIEN) 5 MG tablet Take 1 tablet (5 mg total) by mouth at bedtime as needed.   LORazepam (ATIVAN) 0.5 MG tablet TAKE ONE TABLET EVERY DAY AS NEEDED FOR ANXIETY   sertraline (ZOLOFT) 50 MG tablet Take 1 tablet (50 mg total) by mouth daily.   zolpidem (AMBIEN) 5 MG tablet Take 1 tablet (5 mg total) by mouth at bedtime as needed.   No facility-administered encounter medications on file as of 10/05/2021.     Lab Results  Component Value Date   WBC 5.7 10/03/2021   HGB 13.4 10/03/2021   HCT 39.1 10/03/2021   PLT 269.0 10/03/2021   GLUCOSE 96 10/03/2021   CHOL 202 (H) 10/03/2021   TRIG 80.0 10/03/2021   HDL 69.30 10/03/2021   LDLCALC 116 (H) 10/03/2021   ALT 14 10/03/2021   AST 19 10/03/2021   NA 141 10/03/2021   K 3.6 10/03/2021   CL 105 10/03/2021   CREATININE 0.88 10/03/2021   BUN 14 10/03/2021   CO2 28 10/03/2021   TSH 4.08 10/03/2021   MICROALBUR <0.7 11/14/2015       Assessment & Plan:   Problem List Items Addressed This Visit     Asthma    Breathing stable.       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        GERD (gastroesophageal reflux disease)    No upper symptoms reported. On protonix.       History of breast  cancer    Mammogram 01/24/21 - Birads II.       Hypercholesterolemia    The 10-year ASCVD risk score (Arnett DK, et al., 2019) is: 6.3%   Values used to calculate the score:     Age: 57 years     Sex: Female     Is Non-Hispanic African American: No     Diabetic: No     Tobacco smoker: No     Systolic Blood Pressure: 643 mmHg     Is BP treated: No     HDL Cholesterol: 69.3 mg/dL     Total Cholesterol: 202 mg/dL  Low cholesterol diet and exercise.  Follow lipid panel.       Relevant Orders   Comprehensive metabolic panel   Lipid panel   Insomnia    Taking 1/2 ambien.  Still wakes up.  Trial of whole ambien q hs prn.  Follow.   Call with update.       Migraines    Stable.       Relevant Medications   sertraline (ZOLOFT) 50 MG tablet   Other Visit Diagnoses     Need for immunization against influenza    -  Primary   Relevant Orders   Flu Vaccine QUAD High Dose(Fluad) (Completed)        Einar Pheasant, MD

## 2021-10-06 NOTE — Telephone Encounter (Signed)
Orders placed for labs

## 2021-10-09 ENCOUNTER — Encounter: Payer: Self-pay | Admitting: Internal Medicine

## 2021-10-09 NOTE — Assessment & Plan Note (Signed)
Breathing stable.

## 2021-10-09 NOTE — Assessment & Plan Note (Signed)
Taking 1/2 [place].  Still wakes up.  Trial of whole ambien q hs prn.  Follow.  Call with update.

## 2021-10-09 NOTE — Assessment & Plan Note (Signed)
Mammogram 01/24/21 - Birads II.

## 2021-10-09 NOTE — Assessment & Plan Note (Signed)
No upper symptoms reported.  On protonix.   

## 2021-10-09 NOTE — Assessment & Plan Note (Signed)
Stable

## 2021-10-09 NOTE — Assessment & Plan Note (Signed)
Followed by ophthalmology.   

## 2021-10-18 ENCOUNTER — Other Ambulatory Visit: Payer: Self-pay | Admitting: Internal Medicine

## 2021-11-09 DIAGNOSIS — H2513 Age-related nuclear cataract, bilateral: Secondary | ICD-10-CM | POA: Diagnosis not present

## 2021-11-09 DIAGNOSIS — H3589 Other specified retinal disorders: Secondary | ICD-10-CM | POA: Diagnosis not present

## 2021-11-09 DIAGNOSIS — H35711 Central serous chorioretinopathy, right eye: Secondary | ICD-10-CM | POA: Diagnosis not present

## 2021-11-14 DIAGNOSIS — H2513 Age-related nuclear cataract, bilateral: Secondary | ICD-10-CM | POA: Diagnosis not present

## 2021-11-14 DIAGNOSIS — H40033 Anatomical narrow angle, bilateral: Secondary | ICD-10-CM | POA: Diagnosis not present

## 2021-12-08 ENCOUNTER — Other Ambulatory Visit: Payer: Self-pay | Admitting: Internal Medicine

## 2022-01-05 DIAGNOSIS — H3589 Other specified retinal disorders: Secondary | ICD-10-CM | POA: Diagnosis not present

## 2022-01-05 DIAGNOSIS — H35052 Retinal neovascularization, unspecified, left eye: Secondary | ICD-10-CM | POA: Diagnosis not present

## 2022-01-06 DIAGNOSIS — H35052 Retinal neovascularization, unspecified, left eye: Secondary | ICD-10-CM | POA: Insufficient documentation

## 2022-01-15 ENCOUNTER — Other Ambulatory Visit (INDEPENDENT_AMBULATORY_CARE_PROVIDER_SITE_OTHER): Payer: PPO

## 2022-01-15 ENCOUNTER — Other Ambulatory Visit: Payer: Self-pay

## 2022-01-15 DIAGNOSIS — E7800 Pure hypercholesterolemia, unspecified: Secondary | ICD-10-CM | POA: Diagnosis not present

## 2022-01-15 LAB — COMPREHENSIVE METABOLIC PANEL
ALT: 14 U/L (ref 0–35)
AST: 18 U/L (ref 0–37)
Albumin: 4.4 g/dL (ref 3.5–5.2)
Alkaline Phosphatase: 64 U/L (ref 39–117)
BUN: 14 mg/dL (ref 6–23)
CO2: 31 mEq/L (ref 19–32)
Calcium: 9.5 mg/dL (ref 8.4–10.5)
Chloride: 104 mEq/L (ref 96–112)
Creatinine, Ser: 0.76 mg/dL (ref 0.40–1.20)
GFR: 79.81 mL/min (ref >60.00)
Glucose, Bld: 91 mg/dL (ref 70–99)
Potassium: 3.9 mEq/L (ref 3.5–5.1)
Sodium: 140 mEq/L (ref 135–145)
Total Bilirubin: 0.6 mg/dL (ref 0.2–1.2)
Total Protein: 6.7 g/dL (ref 6.0–8.3)

## 2022-01-15 LAB — LIPID PANEL
Cholesterol: 195 mg/dL (ref 0–200)
HDL: 62 mg/dL (ref >39.00)
LDL Cholesterol: 113 mg/dL — ABNORMAL HIGH (ref 0–99)
NonHDL: 132.69
Total CHOL/HDL Ratio: 3
Triglycerides: 96 mg/dL (ref 0.0–149.0)
VLDL: 19.2 mg/dL (ref 0.0–40.0)

## 2022-01-16 ENCOUNTER — Ambulatory Visit (INDEPENDENT_AMBULATORY_CARE_PROVIDER_SITE_OTHER): Payer: PPO | Admitting: Internal Medicine

## 2022-01-16 ENCOUNTER — Encounter: Payer: Self-pay | Admitting: Internal Medicine

## 2022-01-16 VITALS — BP 110/70 | HR 67 | Temp 97.8°F | Resp 16 | Ht 69.0 in | Wt 143.0 lb

## 2022-01-16 DIAGNOSIS — G43809 Other migraine, not intractable, without status migrainosus: Secondary | ICD-10-CM

## 2022-01-16 DIAGNOSIS — J4520 Mild intermittent asthma, uncomplicated: Secondary | ICD-10-CM | POA: Diagnosis not present

## 2022-01-16 DIAGNOSIS — H35719 Central serous chorioretinopathy, unspecified eye: Secondary | ICD-10-CM

## 2022-01-16 DIAGNOSIS — Z853 Personal history of malignant neoplasm of breast: Secondary | ICD-10-CM | POA: Diagnosis not present

## 2022-01-16 DIAGNOSIS — F419 Anxiety disorder, unspecified: Secondary | ICD-10-CM

## 2022-01-16 DIAGNOSIS — K219 Gastro-esophageal reflux disease without esophagitis: Secondary | ICD-10-CM

## 2022-01-16 DIAGNOSIS — E7800 Pure hypercholesterolemia, unspecified: Secondary | ICD-10-CM | POA: Diagnosis not present

## 2022-01-16 DIAGNOSIS — Z0000 Encounter for general adult medical examination without abnormal findings: Secondary | ICD-10-CM

## 2022-01-16 NOTE — Assessment & Plan Note (Signed)
Physical today 01/16/22.  PAP 01/2018 - changes c/w atrophy.  Negative HPV.  Mammogram 01/24/21 - birads II.  Scheduled for 01/29/22. Colonoscopy 2018 Tiffany Kocher).

## 2022-01-16 NOTE — Progress Notes (Signed)
[name], female   DOB: [birth year], 71 y.o.   MRN: [id number]   Subjective:    [name], female    DOB: [birth year], 71 y.o.   MRN: [id number]  This visit occurred during the SARS-CoV-2 public health emergency.  Safety protocols were in place, including screening questions prior to the visit, additional usage of staff PPE, and extensive cleaning of exam room while observing appropriate contact time as indicated for disinfecting solutions.   Patient here for her physical exam.   Chief Complaint  Patient presents with   Annual Exam   .   HPI Increased stress related to her eye issues. S/p avastin - injection.  Waiting to see if going to work.  Followed closely by ophthalmology.  Discussed.   Does not feel needs any further intervention at this time.  No chest pain or sob reported.  No abdominal pain or bowel change reported.     Past Medical History:  Diagnosis Date   Basal cell carcinoma    multiple removed/[facility]   Breast cancer ([place]) 12/13   s/p right lumpectomy, s/p chemo and XRT   DES exposure in utero    Hepatitis A    History of chicken pox    Hyperactive airway disease    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HCC)    Migraines    Miscarriage    x3   Past Surgical History:  Procedure Laterality Date   BASAL CELL CARCINOMA EXCISION     Multiple   BREAST BIOPSY  1975   Cyst   BREAST LUMPECTOMY WITH AXILLARY LYMPH NODE DISSECTION  01/06/13, 01/22/13   Dr. Drue Dun, [facility], second surgery for pos margin   CERVICAL FUSION  2001   COLONOSCOPY     DILATION AND CURETTAGE OF UTERUS     x 3   LAPAROSCOPIC BILATERAL SALPINGO OOPHERECTOMY Bilateral 12/21/2020   Procedure: LAPAROSCOPIC BILATERAL SALPINGO OOPHORECTOMY;  Surgeon: Ward, Honor Loh, MD;  Location: [facility] ORS;  Service: Gynecology;  Laterality: Bilateral;   VAGINAL DELIVERY     x4   WISDOM TOOTH EXTRACTION     Family History  Problem Relation Age of Onset   Pancreatic cancer  Mother    Cancer Mother        Pancreatic   Arthritis Mother    Hyperlipidemia Mother    Hypertension Mother    Heart attack Father    Hyperlipidemia Father    Hypertension Father    Heart disease Father 55   Colon cancer Other    Cancer Other 95       Breast and Ovarian- dx 50's/Colon   Birth defects Maternal Aunt        Breast - in 62's   Stroke Maternal Grandmother    Birth defects Maternal Aunt        Breast - in 19's   Social History   Socioeconomic History   Marital status: Married    Spouse name: ED   Number of children: 4   Years of education: Not on file   Highest education level: Not on file  Occupational History   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    Social Determinants of Health   Financial Resource Strain: Low Risk    Difficulty of Paying Living Expenses: Not hard at all  Food Insecurity: No Food Insecurity   Worried About Charity fundraiser in the Last Year: Never true   [place] in the Last Year: Never true  Transportation Needs: No Transportation Needs   Lack of Transportation (Medical): No   Lack of Transportation (Non-Medical): No  Physical Activity: Sufficiently Active   Days of Exercise per Week: 5 days   Minutes of Exercise per Session: 40 min  Stress: No Stress Concern Present   Feeling of Stress : Only a little  Social Connections: Unknown   Frequency of Communication with Friends and Family: More than three times a week   Frequency of Social Gatherings with Friends and Family: More than three times a week   Attends Religious Services: Not on Electrical engineer or  Organizations: Not on file   Attends Archivist Meetings: Not on file   Marital Status: Not on file     Review of Systems  Constitutional:  Negative for appetite change and unexpected weight change.  HENT:  Negative for congestion, sinus pressure and sore throat.   Eyes:  Positive for visual disturbance. Negative for pain.  Respiratory:  Negative for cough, chest tightness and shortness of breath.   Cardiovascular:  Negative for chest pain, palpitations and leg swelling.  Gastrointestinal:  Negative for abdominal pain, diarrhea, nausea and vomiting.  Genitourinary:  Negative for difficulty urinating and dysuria.  Musculoskeletal:  Negative for joint swelling and myalgias.  Skin:  Negative for color change and rash.  Neurological:  Negative for dizziness, light-headedness and headaches.  Hematological:  Negative for adenopathy. Does not bruise/bleed easily.  Psychiatric/Behavioral:  Negative for agitation and dysphoric mood.       Objective:     BP 110/70    Pulse 67    Temp 97.8 F (36.6 C)    Resp 16    Ht 5\' 9"  (1.753 m)    Wt 143 lb (64.9 kg)    SpO2 98%    BMI 21.12 kg/m  Wt Readings from Last 3 Encounters:  01/16/22 143 lb (64.9 kg)  10/05/21 145 lb 9.6 oz (66 kg)  08/15/21 146 lb (66.2 kg)    Physical Exam Vitals reviewed.  Constitutional:      General: She is not in acute distress.    Appearance: Normal appearance. She is well-developed.  HENT:     Head: Normocephalic and atraumatic.     Right Ear: External ear normal.     Left Ear: External ear normal.  Eyes:     General: No scleral icterus.       Right eye: No discharge.        Left eye: No discharge.     Conjunctiva/sclera: Conjunctivae normal.  Neck:     Thyroid: No thyromegaly.  Cardiovascular:     Rate and Rhythm: Normal rate and regular rhythm.  Pulmonary:     Effort: No tachypnea, accessory muscle usage or respiratory distress.     Breath sounds: Normal breath sounds. No decreased breath  sounds or wheezing.  Chest:  Breasts:    Right: No inverted nipple, mass, nipple discharge or tenderness (no axillary adenopathy).     Left: No inverted nipple, mass,  nipple discharge or tenderness (no axilarry adenopathy).  Abdominal:     General: Bowel sounds are normal.     Palpations: Abdomen is soft.     Tenderness: There is no abdominal tenderness.  Musculoskeletal:        General: No swelling or tenderness.     Cervical back: Neck supple. No tenderness.  Lymphadenopathy:     Cervical: No cervical adenopathy.  Skin:    Findings: No erythema or rash.  Neurological:     Mental Status: She is alert and oriented to person, place, and time.  Psychiatric:        Mood and Affect: Mood normal.        Behavior: Behavior normal.     Outpatient Encounter Medications as of 01/16/2022  Medication Sig   albuterol (VENTOLIN HFA) 108 (90 Base) MCG/ACT inhaler Inhale 2 puffs into the lungs every 6 (six) hours as needed for wheezing or shortness of breath.   BIOTIN PO Take 10,000 mg by mouth daily.    butalbital-aspirin-caffeine (FIORINAL) 50-325-40 MG capsule Take 1 capsule by mouth 2 (two) times daily as needed for headache.   Cholecalciferol (VITAMIN D3) 125 MCG (5000 UT) CAPS Take 5,000 Units by mouth daily.   Glucosamine-Chondroitin (MOVE FREE PO) Take 1 capsule by mouth daily. Cartilage/boron/hyalauronic acid   hydrOXYzine (ATARAX/VISTARIL) 10 MG tablet TAKE ONE TABLET BY MOUTH EVERY DAY AS NEEDED FOR ITCHING   ipratropium (ATROVENT) 0.06 % nasal spray SPRAY TWICE INTO EACH NOSTRIL TWICE DAILY   LORazepam (ATIVAN) 0.5 MG tablet TAKE ONE TABLET EVERY DAY AS NEEDED FOR ANXIETY   MAGNESIUM BISGLYCINATE PO Take 200 mg by mouth daily.   Multiple Minerals-Vitamins (CALCIUM CITRATE PLUS/MAGNESIUM PO) Take 2 tablets by mouth 2 (two) times daily. CAL CITRATE 500-MAGNESIUM 80-ZINC-10   Multiple Vitamins-Minerals (MULTIVITAMIN WOMENS 50+ ADV PO) Take 1 tablet by mouth daily.   Omega-3 Fatty Acids  (OMEGA-3 FISH OIL PO) Take 1,280 mg by mouth 2 (two) times daily.   pantoprazole (PROTONIX) 20 MG tablet TAKE ONE TABLET BY MOUTH TWICE DAILY BEFORE A MEAL   propranolol (INDERAL) 40 MG tablet TAKE 1 TABLET BY MOUTH DAILY   Riboflavin (VITAMIN B2 PO) Take 250 mg by mouth daily.   sertraline (ZOLOFT) 50 MG tablet TAKE 1 TABLET BY MOUTH DAILY   zolpidem (AMBIEN) 5 MG tablet Take 1 tablet (5 mg total) by mouth at bedtime as needed.   [DISCONTINUED] TURMERIC CURCUMIN PO Take 2,250 mg by mouth in the morning and at bedtime.   No facility-administered encounter medications on file as of 01/16/2022.     Lab Results  Component Value Date   WBC 5.7 10/03/2021   HGB 13.4 10/03/2021   HCT 39.1 10/03/2021   PLT 269.0 10/03/2021   GLUCOSE 91 01/15/2022   CHOL 195 01/15/2022   TRIG 96.0 01/15/2022   HDL 62.00 01/15/2022   LDLCALC 113 (H) 01/15/2022   ALT 14 01/15/2022   AST 18 01/15/2022   NA 140 01/15/2022   K 3.9 01/15/2022   CL 104 01/15/2022   CREATININE 0.76 01/15/2022   BUN 14 01/15/2022   CO2 31 01/15/2022   TSH 4.08 10/03/2021   MICROALBUR <0.7 11/14/2015      Assessment & Plan:   Problem List Items Addressed This Visit     Anxiety    Increased stress as outlined.  Discussed.  On zoloft. Follow closely.       Asthma    Breathing stable.  Central serous retinopathy    Being followed by ophthalmology as outlined.        GERD (gastroesophageal reflux disease)    No upper symptoms reported. On protonix.       Healthcare maintenance    Physical today 01/16/22.  PAP 01/2018 - changes c/w atrophy.  Negative HPV.  Mammogram 01/24/21 - birads II.  Scheduled for 01/29/22. Colonoscopy 2018 Tiffany Kocher).       History of breast cancer    Mammogram 01/24/21 - Birads II.       Hypercholesterolemia    The 10-year ASCVD risk score (Arnett DK, et al., 2019) is: 6.2%   Values used to calculate the score:     Age: 5 years     Sex: Female     Is Non-Hispanic African American: No      Diabetic: No     Tobacco smoker: No     Systolic Blood Pressure: 818 mmHg     Is BP treated: No     HDL Cholesterol: 62 mg/dL     Total Cholesterol: 195 mg/dL  Low cholesterol diet and exercise.  Follow lipid panel.       Relevant Orders   Comprehensive metabolic panel   Lipid panel   Migraines    Stable.       Other Visit Diagnoses     Routine general medical examination at a health care facility    -  Primary        Einar Pheasant, MD

## 2022-01-21 ENCOUNTER — Encounter: Payer: Self-pay | Admitting: Internal Medicine

## 2022-01-21 NOTE — Assessment & Plan Note (Signed)
Breathing stable.

## 2022-01-21 NOTE — Assessment & Plan Note (Signed)
Increased stress as outlined.  Discussed.  On zoloft. Follow closely.

## 2022-01-21 NOTE — Assessment & Plan Note (Signed)
No upper symptoms reported.  On protonix.   

## 2022-01-21 NOTE — Assessment & Plan Note (Signed)
The 10-year ASCVD risk score (Arnett DK, et al., 2019) is: 6.2%   Values used to calculate the score:     Age: 71 years     Sex: Female     Is Non-Hispanic African American: No     Diabetic: No     Tobacco smoker: No     Systolic Blood Pressure: 262 mmHg     Is BP treated: No     HDL Cholesterol: 62 mg/dL     Total Cholesterol: 195 mg/dL  Low cholesterol diet and exercise.  Follow lipid panel.

## 2022-01-21 NOTE — Assessment & Plan Note (Signed)
Mammogram 01/24/21 - Birads II.

## 2022-01-21 NOTE — Assessment & Plan Note (Signed)
Stable

## 2022-01-21 NOTE — Assessment & Plan Note (Signed)
Being followed by ophthalmology as outlined.

## 2022-01-22 ENCOUNTER — Other Ambulatory Visit: Payer: Self-pay | Admitting: Internal Medicine

## 2022-01-23 NOTE — Telephone Encounter (Signed)
Rx ok'd for [employer] #30 with no refills and lorazpam #30 with no refills.

## 2022-01-29 DIAGNOSIS — M8580 Other specified disorders of bone density and structure, unspecified site: Secondary | ICD-10-CM | POA: Diagnosis not present

## 2022-01-29 DIAGNOSIS — Z853 Personal history of malignant neoplasm of breast: Secondary | ICD-10-CM | POA: Diagnosis not present

## 2022-01-29 DIAGNOSIS — Z1231 Encounter for screening mammogram for malignant neoplasm of breast: Secondary | ICD-10-CM | POA: Diagnosis not present

## 2022-01-29 DIAGNOSIS — Z9189 Other specified personal risk factors, not elsewhere classified: Secondary | ICD-10-CM | POA: Diagnosis not present

## 2022-01-29 DIAGNOSIS — Z9221 Personal history of antineoplastic chemotherapy: Secondary | ICD-10-CM | POA: Diagnosis not present

## 2022-02-02 DIAGNOSIS — H353221 Exudative age-related macular degeneration, left eye, with active choroidal neovascularization: Secondary | ICD-10-CM | POA: Diagnosis not present

## 2022-02-02 DIAGNOSIS — H35052 Retinal neovascularization, unspecified, left eye: Secondary | ICD-10-CM | POA: Diagnosis not present

## 2022-02-06 ENCOUNTER — Other Ambulatory Visit: Payer: Self-pay | Admitting: Internal Medicine

## 2022-03-12 DIAGNOSIS — H35052 Retinal neovascularization, unspecified, left eye: Secondary | ICD-10-CM | POA: Diagnosis not present

## 2022-03-12 DIAGNOSIS — H353120 Nonexudative age-related macular degeneration, left eye, stage unspecified: Secondary | ICD-10-CM | POA: Diagnosis not present

## 2022-03-19 ENCOUNTER — Telehealth (INDEPENDENT_AMBULATORY_CARE_PROVIDER_SITE_OTHER): Payer: PPO | Admitting: Internal Medicine

## 2022-03-19 VITALS — HR 70

## 2022-03-19 DIAGNOSIS — R059 Cough, unspecified: Secondary | ICD-10-CM | POA: Diagnosis not present

## 2022-03-19 MED ORDER — CHERATUSSIN AC 100-10 MG/5ML PO SOLN: 5.0000 mL | Freq: Three times a day (TID) | ORAL | 0 refills | Status: DC | PRN

## 2022-03-19 MED ORDER — BENZONATATE 200 MG PO CAPS: 200.0000 mg | ORAL_CAPSULE | Freq: Two times a day (BID) | ORAL | 1 refills | Status: DC | PRN

## 2022-03-19 NOTE — Progress Notes (Signed)
Virtual Visit via Caregility Note ? ?This visit type was conducted due to national recommendations for restrictions regarding the COVID-19 pandemic (e.g. social distancing).  This format is felt to be most appropriate for this patient at this time.  All issues noted in this document were discussed and addressed.  No physical exam was performed (except for noted visual exam findings with Video Visits).  ? ?I connected withNAME@ on 03/19/22 at  1:00 PM EDT by a video enabled telemedicine application  and verified that I am speaking with the correct person using two identifiers. ?Location patient: home ?Location provider: work or home office ?Persons participating in the virtual visit: patient, provider ? ?I discussed the limitations, risks, security and privacy concerns of performing an evaluation and management service by telephone and the availability of in person appointments. I also discussed with the patient that there may be a patient responsible charge related to this service. The patient expressed understanding and agreed to proceed. ? ? ?Reason for visit: persistent cough  ? ?HPI: ? 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  Tested positive for COVID on March 10.  Symptoms were mild: fatigue and profuse rhinorrhea.  Has been taking zyrtec qhs and mucinex with no change.  Coughing all night. Also having clear bilateral eye drainage.   ? ? ?ROS: See pertinent positives and negatives per HPI. ? ?Past Medical History:  ?Diagnosis Date  ? Basal cell carcinoma   ? multiple removed/[facility]  ? Breast cancer ([place]) 12/13  ? s/p right lumpectomy, s/p chemo and XRT  ? DES exposure in utero   ? Hepatitis A   ? History of chicken pox   ? Hyperactive airway disease   ? 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 Migraines   ? Miscarriage   ? x3  ? ? ?Past Surgical  History:  ?Procedure Laterality Date  ? BASAL CELL CARCINOMA EXCISION    ? Multiple  ? BREAST BIOPSY  1975  ? Cyst  ? BREAST LUMPECTOMY WITH AXILLARY LYMPH NODE DISSECTION  01/06/13, 01/22/13  ? Dr. Drue Dun, [facility], second surgery for pos margin  ? CERVICAL FUSION  2001  ? COLONOSCOPY    ? DILATION AND CURETTAGE OF UTERUS    ? x 3  ? LAPAROSCOPIC BILATERAL SALPINGO OOPHERECTOMY Bilateral 12/21/2020  ? Procedure: LAPAROSCOPIC BILATERAL SALPINGO OOPHORECTOMY;  Surgeon: Ward, Honor Loh, MD;  Location: [facility] ORS;  Service: Gynecology;  Laterality: Bilateral;  ? VAGINAL DELIVERY    ? x4  ? WISDOM TOOTH EXTRACTION    ? ? ?Family History  ?Problem Relation Age of Onset  ? Pancreatic cancer Mother   ? Cancer Mother   ?     Pancreatic  ? Arthritis Mother   ? Hyperlipidemia Mother   ? Hypertension Mother   ? Heart attack Father   ? Hyperlipidemia Father   ? Hypertension Father   ? Heart disease Father 94  ? Colon cancer Other   ? Cancer Other 50  ?     Breast and Ovarian- dx 50's/Colon  ? Birth defects Maternal Aunt   ?     Breast - in 60's  ? Stroke Maternal Grandmother   ? Birth defects Maternal Aunt   ?     Breast - in 60's  ? ? ?SOCIAL HX:  reports that she has never smoked. She  has never used smokeless tobacco. She reports current alcohol use. She reports that she does not use drugs.  ? ?Current Outpatient Medications:  ?  albuterol (VENTOLIN HFA) 108 (90 Base) MCG/ACT inhaler, Inhale 2 puffs into the lungs every 6 (six) hours as needed for wheezing or shortness of breath., Disp: 18 g, Rfl: 0 ?  benzonatate (TESSALON) 200 MG capsule, Take 1 capsule (200 mg total) by mouth 2 (two) times daily as needed for cough., Disp: 60 capsule, Rfl: 1 ?  BIOTIN PO, Take 10,000 mg by mouth daily. , Disp: , Rfl:  ?  butalbital-aspirin-caffeine (FIORINAL) 50-325-40 MG capsule, Take 1 capsule by mouth 2 (two) times daily as needed for headache., Disp: 30 capsule, Rfl: 0 ?  Cholecalciferol (VITAMIN D3) 125 MCG (5000 UT) CAPS, Take 5,000 Units  by mouth daily., Disp: , Rfl:  ?  Glucosamine-Chondroitin (MOVE FREE PO), Take 1 capsule by mouth daily. Cartilage/boron/hyalauronic acid, Disp: , Rfl:  ?  guaiFENesin-codeine (CHERATUSSIN AC) 100-10 MG/5ML syrup, Take 5 mLs by mouth 3 (three) times daily as needed for cough., Disp: 120 mL, Rfl: 0 ?  hydrOXYzine (ATARAX) 10 MG tablet, TAKE 1 TABLET BY MOUTH EVERY DAY AS NEEDED FOR ITCHING, Disp: 30 tablet, Rfl: 1 ?  ipratropium (ATROVENT) 0.06 % nasal spray, SPRAY TWICE INTO EACH NOSTRIL TWICE DAILY, Disp: 30 mL, Rfl: 2 ?  LORazepam (ATIVAN) 0.5 MG tablet, TAKE 1 TABLET BY MOUTH DAILY AS NEEDED FOR ANXIETY, Disp: 30 tablet, Rfl: 0 ?  MAGNESIUM BISGLYCINATE PO, Take 200 mg by mouth daily., Disp: , Rfl:  ?  Multiple Minerals-Vitamins (CALCIUM CITRATE PLUS/MAGNESIUM PO), Take 2 tablets by mouth 2 (two) times daily. CAL CITRATE 500-MAGNESIUM 80-ZINC-10, Disp: , Rfl:  ?  Multiple Vitamins-Minerals (MULTIVITAMIN WOMENS 50+ ADV PO), Take 1 tablet by mouth daily., Disp: , Rfl:  ?  Omega-3 Fatty Acids (OMEGA-3 FISH OIL PO), Take 1,280 mg by mouth 2 (two) times daily., Disp: , Rfl:  ?  pantoprazole (PROTONIX) 20 MG tablet, TAKE ONE TABLET BY MOUTH TWICE DAILY BEFORE A MEAL, Disp: 180 tablet, Rfl: 1 ?  propranolol (INDERAL) 40 MG tablet, TAKE 1 TABLET BY MOUTH DAILY, Disp: 90 tablet, Rfl: 1 ?  Riboflavin (VITAMIN B2 PO), Take 250 mg by mouth daily., Disp: , Rfl:  ?  sertraline (ZOLOFT) 50 MG tablet, TAKE 1 TABLET BY MOUTH DAILY, Disp: 30 tablet, Rfl: 2 ?  zolpidem (AMBIEN) 5 MG tablet, TAKE 1 TABLET BY MOUTH AT BEDTIME AS NEEDED, Disp: 30 tablet, Rfl: 0 ? ?EXAM: ? ?VITALS per patient if applicable: ? ?GENERAL: alert, oriented, appears well and in no acute distress ? ?HEENT: atraumatic, conjunttiva clear, no obvious abnormalities on inspection of external nose and ears ? ?NECK: normal movements of the head and neck ? ?LUNGS: on inspection no signs of respiratory distress, breathing rate appears normal, no obvious gross SOB,  gasping or wheezing ? ?CV: no obvious cyanosis ? ?MS: moves all visible extremities without noticeable abnormality ? ?PSYCH/NEURO: pleasant and cooperative, no obvious depression or anxiety, speech and thought processing grossly intact ? ?ASSESSMENT AND PLAN: ? ?Discussed the following assessment and plan: ? ?Cough in adult patient - Plan: DG Chest 2 View ? ?Cough in adult patient ?Sending for chest  Xray.  POST COVID timing.  Increase zyrtec to bid.  Refill tessalon and cheratussin.  Add abx if chest  Xray suggests infiltrate.  Cannot take oral or inhaled steroids per ophthalmology ? ?  ?I discussed the assessment and treatment plan with  the patient. The patient was provided an opportunity to ask questions and all were answered. The patient agreed with the plan and demonstrated an understanding of the instructions. ?  ?The patient was advised to call back or seek an in-person evaluation if the symptoms worsen or if the condition fails to improve as anticipated. ? ? ?I spent 30 minutes dedicated to the care of this patient on the date of this encounter to include pre-visit review of his medical history,  Face-to-face time with the patient , and post visit ordering of testing and therapeutics.  ? ? ?Crecencio Mc, MD   ?

## 2022-03-19 NOTE — Assessment & Plan Note (Signed)
Sending for chest  Xray.  POST COVID timing.  Increase zyrtec to bid.  Refill tessalon and cheratussin.  Add abx if chest  Xray suggests infiltrate.  Cannot take oral or inhaled steroids per ophthalmology ?

## 2022-03-21 ENCOUNTER — Other Ambulatory Visit: Payer: Self-pay

## 2022-03-21 ENCOUNTER — Ambulatory Visit (INDEPENDENT_AMBULATORY_CARE_PROVIDER_SITE_OTHER): Payer: PPO

## 2022-03-21 ENCOUNTER — Other Ambulatory Visit: Payer: PPO

## 2022-03-21 DIAGNOSIS — R059 Cough, unspecified: Secondary | ICD-10-CM | POA: Diagnosis not present

## 2022-03-22 ENCOUNTER — Encounter: Payer: Self-pay | Admitting: Internal Medicine

## 2022-03-23 ENCOUNTER — Encounter: Payer: Self-pay | Admitting: Family Medicine

## 2022-03-23 ENCOUNTER — Ambulatory Visit (INDEPENDENT_AMBULATORY_CARE_PROVIDER_SITE_OTHER): Payer: PPO | Admitting: Family Medicine

## 2022-03-23 VITALS — BP 120/80 | HR 75 | Temp 99.1°F | Ht 69.0 in | Wt 142.4 lb

## 2022-03-23 DIAGNOSIS — J40 Bronchitis, not specified as acute or chronic: Secondary | ICD-10-CM | POA: Diagnosis not present

## 2022-03-23 DIAGNOSIS — R059 Cough, unspecified: Secondary | ICD-10-CM

## 2022-03-23 MED ORDER — DOXYCYCLINE HYCLATE 100 MG PO TABS: 100.0000 mg | ORAL_TABLET | Freq: Two times a day (BID) | ORAL | 0 refills | Status: DC

## 2022-03-23 MED ORDER — PREDNISONE 20 MG PO TABS: 40.0000 mg | ORAL_TABLET | Freq: Every day | ORAL | 0 refills | Status: DC

## 2022-03-23 NOTE — Telephone Encounter (Signed)
Pt sched at 3:15 w/ [place] ?

## 2022-03-23 NOTE — Assessment & Plan Note (Signed)
Patient reports this seemed to start after she had already recovered from COVID-19.  This may have been a separate viral illness that is now a bacterial illness.  She did have a chest x-ray that was clear 2 days ago.  Discussed starting on an antibiotic.  Doxycycline 100 mg twice daily for 7 days sent to the pharmacy.  Given that she received clearance from her eye doctor to take steroids I discussed that if she is not improving on just the antibiotic over the next 2 days she could start on the oral steroid and to follow-up with her ophthalmologist closely. ?

## 2022-03-23 NOTE — Patient Instructions (Signed)
Nice to see you. ?Please start the antibiotic. If you are not starting to feel better in the next couple of days please start the steroid and follow-up with your ophthalmologist for recheck of your eyes.  ?

## 2022-03-23 NOTE — Progress Notes (Signed)
?Cindy Rumps, MD ?Phone: [phone] ? ?Cindy Arnold is a 71 y.o. female who presents today for follow-up on cough. ? ?Cough: Patient tested positive for COVID-19 on 3/10.  She has had a cough since 3/15. She noted  she tested negative for covid on 3/16. Her COVID symptoms included mild fatigue and profuse rhinorrhea.  She recently did a virtual visit with Dr. Derrel Nip and was prescribed Tessalon and codeine cough syrup.  She has a history of contraindication to oral and inhaled steroids due to an eye condition though she reports she communicated with her ophthalmologist and they noted they would be okay with her taking steroids given this current illness and she would have to have close eye follow-up.  I did review this message on the patient's phone.  She does have a history of reactive airway disease.  She notes some posttussive emesis.  She had some rhinorrhea that is not thick.  She notes some discomfort in her ribs when she coughs.  She had no wheezing.  She notes it feels like there is water in her chest.   ? ?Social History  ? ?Tobacco Use  ?Smoking Status Never  ?Smokeless Tobacco Never  ? ? ?Current Outpatient Medications on File Prior to Visit  ?Medication Sig Dispense Refill  ? albuterol (VENTOLIN HFA) 108 (90 Base) MCG/ACT inhaler Inhale 2 puffs into the lungs every 6 (six) hours as needed for wheezing or shortness of breath. 18 g 0  ? benzonatate (TESSALON) 200 MG capsule Take 1 capsule (200 mg total) by mouth 2 (two) times daily as needed for cough. 60 capsule 1  ? BIOTIN PO Take 10,000 mg by mouth daily.     ? butalbital-aspirin-caffeine (FIORINAL) 50-325-40 MG capsule Take 1 capsule by mouth 2 (two) times daily as needed for headache. 30 capsule 0  ? Cholecalciferol (VITAMIN D3) 125 MCG (5000 UT) CAPS Take 5,000 Units by mouth daily.    ? Glucosamine-Chondroitin (MOVE FREE PO) Take 1 capsule by mouth daily. Cartilage/boron/hyalauronic acid    ? guaiFENesin-codeine (CHERATUSSIN AC) 100-10 MG/5ML  syrup Take 5 mLs by mouth 3 (three) times daily as needed for cough. 120 mL 0  ? hydrOXYzine (ATARAX) 10 MG tablet TAKE 1 TABLET BY MOUTH EVERY DAY AS NEEDED FOR ITCHING 30 tablet 1  ? ipratropium (ATROVENT) 0.06 % nasal spray SPRAY TWICE INTO EACH NOSTRIL TWICE DAILY 30 mL 2  ? LORazepam (ATIVAN) 0.5 MG tablet TAKE 1 TABLET BY MOUTH DAILY AS NEEDED FOR ANXIETY 30 tablet 0  ? MAGNESIUM BISGLYCINATE PO Take 200 mg by mouth daily.    ? Multiple Minerals-Vitamins (CALCIUM CITRATE PLUS/MAGNESIUM PO) Take 2 tablets by mouth 2 (two) times daily. CAL CITRATE 500-MAGNESIUM 80-ZINC-10    ? Multiple Vitamins-Minerals (MULTIVITAMIN WOMENS 50+ ADV PO) Take 1 tablet by mouth daily.    ? Omega-3 Fatty Acids (OMEGA-3 FISH OIL PO) Take 1,280 mg by mouth 2 (two) times daily.    ? pantoprazole (PROTONIX) 20 MG tablet TAKE ONE TABLET BY MOUTH TWICE DAILY BEFORE A MEAL 180 tablet 1  ? propranolol (INDERAL) 40 MG tablet TAKE 1 TABLET BY MOUTH DAILY 90 tablet 1  ? Riboflavin (VITAMIN B2 PO) Take 250 mg by mouth daily.    ? sertraline (ZOLOFT) 50 MG tablet TAKE 1 TABLET BY MOUTH DAILY 30 tablet 2  ? zolpidem (AMBIEN) 5 MG tablet TAKE 1 TABLET BY MOUTH AT BEDTIME AS NEEDED 30 tablet 0  ? ?No current facility-administered medications on file prior to visit.  ? ? ? ?  ROS see history of present illness ? ?Objective ? ?Physical Exam ?Vitals:  ? 03/23/22 1529  ?BP: 120/80  ?Pulse: 75  ?Temp: 99.1 ?F (37.3 ?C)  ?SpO2: 98%  ? ? ?BP Readings from Last 3 Encounters:  ?03/23/22 120/80  ?01/16/22 110/70  ?10/05/21 112/68  ? ?Wt Readings from Last 3 Encounters:  ?03/23/22 142 lb 6.4 oz (64.6 kg)  ?01/16/22 143 lb (64.9 kg)  ?10/05/21 145 lb 9.6 oz (66 kg)  ? ? ?Physical Exam ?Constitutional:   ?   General: She is not in acute distress. ?   Appearance: She is not diaphoretic.  ?HENT:  ?   Right Ear: Tympanic membrane normal.  ?   Left Ear: Tympanic membrane normal.  ?Cardiovascular:  ?   Rate and Rhythm: Normal rate and regular rhythm.  ?   Heart  sounds: Normal heart sounds.  ?Pulmonary:  ?   Effort: Pulmonary effort is normal.  ?   Breath sounds: Normal breath sounds.  ?Skin: ?   General: Skin is warm and dry.  ?Neurological:  ?   Mental Status: She is alert.  ? ? ? ?Assessment/Plan: Please see individual problem list. ? ?Problem List Items Addressed This Visit   ? ? Cough in adult patient  ?  Patient reports this seemed to start after she had already recovered from COVID-19.  This may have been a separate viral illness that is now a bacterial illness.  She did have a chest x-ray that was clear 2 days ago.  Discussed starting on an antibiotic.  Doxycycline 100 mg twice daily for 7 days sent to the pharmacy.  Given that she received clearance from her eye doctor to take steroids I discussed that if she is not improving on just the antibiotic over the next 2 days she could start on the oral steroid and to follow-up with her ophthalmologist closely. ?  ?  ? ?Other Visit Diagnoses   ? ? Bronchitis    -  Primary  ? Relevant Medications  ? doxycycline (VIBRA-TABS) 100 MG tablet  ? predniSONE (DELTASONE) 20 MG tablet  ? ?  ? ? ?Return if symptoms worsen or fail to improve. ? ?This visit occurred during the SARS-CoV-2 public health emergency.  Safety protocols were in place, including screening questions prior to the visit, additional usage of staff PPE, and extensive cleaning of exam room while observing appropriate contact time as indicated for disinfecting solutions.  ? ? ?Cindy Rumps, MD ?[facility] ? ?

## 2022-03-27 ENCOUNTER — Other Ambulatory Visit: Payer: PPO

## 2022-03-29 ENCOUNTER — Encounter: Payer: Self-pay | Admitting: Internal Medicine

## 2022-04-12 ENCOUNTER — Other Ambulatory Visit: Payer: Self-pay | Admitting: Internal Medicine

## 2022-04-16 ENCOUNTER — Other Ambulatory Visit: Payer: Self-pay | Admitting: Internal Medicine

## 2022-04-23 ENCOUNTER — Other Ambulatory Visit: Payer: Self-pay | Admitting: Internal Medicine

## 2022-04-24 NOTE — Telephone Encounter (Signed)
Rx ok'd for ambien #30 with no refills.  

## 2022-04-27 DIAGNOSIS — H3589 Other specified retinal disorders: Secondary | ICD-10-CM | POA: Diagnosis not present

## 2022-04-27 DIAGNOSIS — H35052 Retinal neovascularization, unspecified, left eye: Secondary | ICD-10-CM | POA: Diagnosis not present

## 2022-04-30 ENCOUNTER — Telehealth: Payer: Self-pay | Admitting: Internal Medicine

## 2022-04-30 NOTE — Telephone Encounter (Signed)
Copied from [place] [id number]. Topic: Medicare AWV ?>> Apr 30, 2022 10:27 AM Harris-Coley, Hannah Beat wrote: ?Reason for CRM: Left message for patient to schedule Annual Wellness Visit.  Please schedule with Nurse Health Advisor Denisa O'Brien-Blaney, LPN at [facility].  Please call [phone] ask for Juliann Pulse ?

## 2022-05-01 ENCOUNTER — Other Ambulatory Visit: Payer: Self-pay | Admitting: Internal Medicine

## 2022-05-10 DIAGNOSIS — D0372 Melanoma in situ of left lower limb, including hip: Secondary | ICD-10-CM | POA: Diagnosis not present

## 2022-05-10 DIAGNOSIS — D0461 Carcinoma in situ of skin of right upper limb, including shoulder: Secondary | ICD-10-CM | POA: Diagnosis not present

## 2022-05-10 DIAGNOSIS — L570 Actinic keratosis: Secondary | ICD-10-CM | POA: Diagnosis not present

## 2022-05-10 DIAGNOSIS — D485 Neoplasm of uncertain behavior of skin: Secondary | ICD-10-CM | POA: Diagnosis not present

## 2022-05-10 DIAGNOSIS — L309 Dermatitis, unspecified: Secondary | ICD-10-CM | POA: Diagnosis not present

## 2022-05-10 DIAGNOSIS — D2262 Melanocytic nevi of left upper limb, including shoulder: Secondary | ICD-10-CM | POA: Diagnosis not present

## 2022-05-10 DIAGNOSIS — D2261 Melanocytic nevi of right upper limb, including shoulder: Secondary | ICD-10-CM | POA: Diagnosis not present

## 2022-05-10 DIAGNOSIS — X32XXXA Exposure to sunlight, initial encounter: Secondary | ICD-10-CM | POA: Diagnosis not present

## 2022-05-10 DIAGNOSIS — D2271 Melanocytic nevi of right lower limb, including hip: Secondary | ICD-10-CM | POA: Diagnosis not present

## 2022-05-11 DIAGNOSIS — H35052 Retinal neovascularization, unspecified, left eye: Secondary | ICD-10-CM | POA: Diagnosis not present

## 2022-05-11 DIAGNOSIS — H3589 Other specified retinal disorders: Secondary | ICD-10-CM | POA: Diagnosis not present

## 2022-05-17 ENCOUNTER — Other Ambulatory Visit (INDEPENDENT_AMBULATORY_CARE_PROVIDER_SITE_OTHER): Payer: PPO

## 2022-05-17 DIAGNOSIS — E7800 Pure hypercholesterolemia, unspecified: Secondary | ICD-10-CM

## 2022-05-17 LAB — COMPREHENSIVE METABOLIC PANEL
ALT: 17 U/L (ref 0–35)
AST: 23 U/L (ref 0–37)
Albumin: 4.3 g/dL (ref 3.5–5.2)
Alkaline Phosphatase: 75 U/L (ref 39–117)
BUN: 16 mg/dL (ref 6–23)
CO2: 29 mEq/L (ref 19–32)
Calcium: 9.3 mg/dL (ref 8.4–10.5)
Chloride: 104 mEq/L (ref 96–112)
Creatinine, Ser: 0.86 mg/dL (ref 0.40–1.20)
GFR: 68.65 mL/min (ref >60.00)
Glucose, Bld: 93 mg/dL (ref 70–99)
Potassium: 3.4 mEq/L — ABNORMAL LOW (ref 3.5–5.1)
Sodium: 142 mEq/L (ref 135–145)
Total Bilirubin: 0.5 mg/dL (ref 0.2–1.2)
Total Protein: 6.9 g/dL (ref 6.0–8.3)

## 2022-05-17 LAB — LIPID PANEL
Cholesterol: 198 mg/dL (ref 0–200)
HDL: 72 mg/dL (ref >39.00)
LDL Cholesterol: 113 mg/dL — ABNORMAL HIGH (ref 0–99)
NonHDL: 126.39
Total CHOL/HDL Ratio: 3
Triglycerides: 67 mg/dL (ref 0.0–149.0)
VLDL: 13.4 mg/dL (ref 0.0–40.0)

## 2022-05-18 ENCOUNTER — Other Ambulatory Visit: Payer: Self-pay

## 2022-05-18 ENCOUNTER — Ambulatory Visit: Payer: PPO | Admitting: Internal Medicine

## 2022-05-18 DIAGNOSIS — E876 Hypokalemia: Secondary | ICD-10-CM

## 2022-05-25 ENCOUNTER — Ambulatory Visit (INDEPENDENT_AMBULATORY_CARE_PROVIDER_SITE_OTHER): Payer: PPO

## 2022-05-25 ENCOUNTER — Other Ambulatory Visit: Payer: Self-pay

## 2022-05-25 VITALS — Ht 69.0 in | Wt 142.0 lb

## 2022-05-25 DIAGNOSIS — Z1211 Encounter for screening for malignant neoplasm of colon: Secondary | ICD-10-CM

## 2022-05-25 DIAGNOSIS — Z0000 Encounter for general adult medical examination without abnormal findings: Secondary | ICD-10-CM | POA: Diagnosis not present

## 2022-05-25 DIAGNOSIS — Z86010 Personal history of colonic polyps: Secondary | ICD-10-CM

## 2022-05-25 MED ORDER — NA SULFATE-K SULFATE-MG SULF 17.5-3.13-1.6 GM/177ML PO SOLN: 1.0000 | Freq: Once | ORAL | 0 refills | Status: AC

## 2022-05-25 NOTE — Patient Instructions (Addendum)
  [name] , Thank you for taking time to come for your Medicare Wellness Visit. I appreciate your ongoing commitment to your health goals. Please review the following plan we discussed and let me know if I can assist you in the future.   These are the goals we discussed:  Goals       Patient Cindy Arnold (pt-stated)      I plan to complete advanced directive         This is a list of the screening recommended for you and due dates:  Health Maintenance  Topic Date Due   Colon Cancer Screening  02/05/2022   COVID-19 Vaccine (3 - Pfizer risk series) 06/10/2022*   Flu Shot  07/24/2022   Mammogram  01/24/2023   Tetanus Vaccine  07/09/2027   Pneumonia Vaccine  Completed   DEXA scan (bone density measurement)  Completed   Hepatitis C Screening: USPSTF Recommendation to screen - Ages 58-79 yo.  Completed   Zoster (Shingles) Vaccine  Completed   HPV Vaccine  Aged Out  *Topic was postponed. The date shown is not the original due date.

## 2022-05-25 NOTE — Progress Notes (Signed)
Gastroenterology Pre-Procedure Review  Request Date: 06/11/2022 Requesting Physician: Dr. Marius Ditch   PATIENT REVIEW QUESTIONS: The patient responded to the following health history questions as indicated:    1. Are you having any GI issues? no 2. Do you have a personal history of Polyps? yes (last colonoscopy) 3. Do you have a family history of Colon Cancer or Polyps? yes (colon cancer) 4. Diabetes Mellitus? no 5. Joint replacements in the past 12 months?no 6. Major health problems in the past 3 months?no 7. Any artificial heart valves, MVP, or defibrillator?no    MEDICATIONS & ALLERGIES:    Patient reports the following regarding taking any anticoagulation/antiplatelet therapy:   Plavix, Coumadin, Eliquis, Xarelto, Lovenox, Pradaxa, Brilinta, or Effient? no Aspirin? no  Patient confirms/reports the following medications:  Current Outpatient Medications  Medication Sig Dispense Refill   albuterol (VENTOLIN HFA) 108 (90 Base) MCG/ACT inhaler Inhale 2 puffs into the lungs every 6 (six) hours as needed for wheezing or shortness of breath. 18 g 0   benzonatate (TESSALON) 200 MG capsule Take 1 capsule (200 mg total) by mouth 2 (two) times daily as needed for cough. 60 capsule 1   BIOTIN PO Take 10,000 mg by mouth daily.      butalbital-aspirin-caffeine (FIORINAL) 50-325-40 MG capsule Take 1 capsule by mouth 2 (two) times daily as needed for headache. 30 capsule 0   Cholecalciferol (VITAMIN D3) 125 MCG (5000 UT) CAPS Take 5,000 Units by mouth daily.     doxycycline (VIBRA-TABS) 100 MG tablet Take 1 tablet (100 mg total) by mouth 2 (two) times daily. 14 tablet 0   EVENING PRIMROSE OIL PO Take 1,300 mg by mouth.     Glucosamine-Chondroitin (MOVE FREE PO) Take 1 capsule by mouth daily. Cartilage/boron/hyalauronic acid     guaiFENesin-codeine (CHERATUSSIN AC) 100-10 MG/5ML syrup Take 5 mLs by mouth 3 (three) times daily as needed for cough. 120 mL 0   hydrOXYzine (ATARAX) 10 MG tablet TAKE 1  TABLET BY MOUTH EVERY DAY AS NEEDED FOR ITCHING 30 tablet 1   ipratropium (ATROVENT) 0.06 % nasal spray SPRAY TWICE INTO EACH NOSTRIL TWICE DAILY 30 mL 2   LORazepam (ATIVAN) 0.5 MG tablet TAKE 1 TABLET BY MOUTH DAILY AS NEEDED FOR ANXIETY 30 tablet 0   MAGNESIUM BISGLYCINATE PO Take 200 mg by mouth daily.     Multiple Minerals-Vitamins (CALCIUM CITRATE PLUS/MAGNESIUM PO) Take 2 tablets by mouth 2 (two) times daily. CAL CITRATE 500-MAGNESIUM 80-ZINC-10     Multiple Vitamins-Minerals (MULTIVITAMIN WOMENS 50+ ADV PO) Take 1 tablet by mouth daily.     Omega-3 Fatty Acids (OMEGA-3 FISH OIL PO) Take 1,280 mg by mouth 2 (two) times daily.     pantoprazole (PROTONIX) 20 MG tablet TAKE ONE TABLET BY MOUTH TWICE DAILY BEFORE A MEAL 180 tablet 1   predniSONE (DELTASONE) 20 MG tablet Take 2 tablets (40 mg total) by mouth daily with breakfast. 10 tablet 0   propranolol (INDERAL) 40 MG tablet TAKE 1 TABLET BY MOUTH DAILY 90 tablet 1   Riboflavin (VITAMIN B2 PO) Take 250 mg by mouth daily.     sertraline (ZOLOFT) 50 MG tablet TAKE 1 TABLET BY MOUTH DAILY 30 tablet 2   zolpidem (AMBIEN) 5 MG tablet TAKE 1 TABLET BY MOUTH AT BEDTIME AS NEEDED 30 tablet 0   No current facility-administered medications for this visit.    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     No orders of the defined types were placed in this encounter.   AUTHORIZATION INFORMATION Primary Insurance: 1D#: Group #:  Secondary Insurance: 1D#: Group #:  SCHEDULE INFORMATION: Date: 06/11/2022 Time: Location:[facility]

## 2022-05-25 NOTE — Progress Notes (Signed)
Subjective:   Cindy Arnold is a 71 y.o. female who presents for Medicare Annual (Subsequent) preventive examination.  Review of Systems    No ROS.  Medicare Wellness Virtual Visit.  Visual/audio telehealth visit, UTA vital signs.   See social history for additional risk factors.   Cardiac Risk Factors include: advanced age (>14mn, >>19women)     Objective:    Today's Vitals   05/25/22 0959  Weight: 142 lb (64.4 kg)  Height: '5\' 9"'$  (1.753 m)   Body mass index is 20.97 kg/m.     05/25/2022   11:31 AM 04/28/2021    1:31 PM 12/14/2020   11:28 AM 11/18/2020    8:25 PM 04/27/2020   11:49 AM 04/27/2019   12:04 PM  Advanced Directives  Does Patient Have a Medical Advance Directive? No No No No No Yes  Type of ATeacher, early years/preLiving will  Does patient want to make changes to medical advance directive?      No - Guardian declined  Copy of HHamiltonin Chart?      No - copy requested  Would patient like information on creating a medical advance directive? No - Patient declined No - Patient declined   No - Patient declined     Current Medications (verified) Outpatient Encounter Medications as of 05/25/2022  Medication Sig   EVENING PRIMROSE OIL PO Take 1,300 mg by mouth.   albuterol (VENTOLIN HFA) 108 (90 Base) MCG/ACT inhaler Inhale 2 puffs into the lungs every 6 (six) hours as needed for wheezing or shortness of breath.   benzonatate (TESSALON) 200 MG capsule Take 1 capsule (200 mg total) by mouth 2 (two) times daily as needed for cough.   BIOTIN PO Take 10,000 mg by mouth daily.    butalbital-aspirin-caffeine (FIORINAL) 50-325-40 MG capsule Take 1 capsule by mouth 2 (two) times daily as needed for headache.   Cholecalciferol (VITAMIN D3) 125 MCG (5000 UT) CAPS Take 5,000 Units by mouth daily.   doxycycline (VIBRA-TABS) 100 MG tablet Take 1 tablet (100 mg total) by mouth 2 (two) times daily.   Glucosamine-Chondroitin (MOVE FREE  PO) Take 1 capsule by mouth daily. Cartilage/boron/hyalauronic acid   guaiFENesin-codeine (CHERATUSSIN AC) 100-10 MG/5ML syrup Take 5 mLs by mouth 3 (three) times daily as needed for cough.   hydrOXYzine (ATARAX) 10 MG tablet TAKE 1 TABLET BY MOUTH EVERY DAY AS NEEDED FOR ITCHING   ipratropium (ATROVENT) 0.06 % nasal spray SPRAY TWICE INTO EACH NOSTRIL TWICE DAILY   LORazepam (ATIVAN) 0.5 MG tablet TAKE 1 TABLET BY MOUTH DAILY AS NEEDED FOR ANXIETY   MAGNESIUM BISGLYCINATE PO Take 200 mg by mouth daily.   Multiple Minerals-Vitamins (CALCIUM CITRATE PLUS/MAGNESIUM PO) Take 2 tablets by mouth 2 (two) times daily. CAL CITRATE 500-MAGNESIUM 80-ZINC-10   Multiple Vitamins-Minerals (MULTIVITAMIN WOMENS 50+ ADV PO) Take 1 tablet by mouth daily.   Omega-3 Fatty Acids (OMEGA-3 FISH OIL PO) Take 1,280 mg by mouth 2 (two) times daily.   pantoprazole (PROTONIX) 20 MG tablet TAKE ONE TABLET BY MOUTH TWICE DAILY BEFORE A MEAL   predniSONE (DELTASONE) 20 MG tablet Take 2 tablets (40 mg total) by mouth daily with breakfast.   propranolol (INDERAL) 40 MG tablet TAKE 1 TABLET BY MOUTH DAILY   Riboflavin (VITAMIN B2 PO) Take 250 mg by mouth daily.   sertraline (ZOLOFT) 50 MG tablet TAKE 1 TABLET BY MOUTH DAILY   zolpidem (AMBIEN) 5 MG  tablet TAKE 1 TABLET BY MOUTH AT BEDTIME AS NEEDED   No facility-administered encounter medications on file as of 05/25/2022.    Allergies (verified) Prednisone   History: Past Medical History:  Diagnosis Date   Basal cell carcinoma    multiple removed/[facility]   Breast cancer ([place]) 12/13   s/p right lumpectomy, s/p chemo and XRT   DES exposure in utero    Hepatitis A    History of chicken pox    Hyperactive airway disease    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HCC)    Migraines    Miscarriage    x3   Past Surgical History:  Procedure Laterality Date   BASAL CELL CARCINOMA EXCISION     Multiple   BREAST BIOPSY  1975   Cyst   BREAST LUMPECTOMY WITH AXILLARY  LYMPH NODE DISSECTION  01/06/13, 01/22/13   Dr. Drue Dun, [facility], second surgery for pos margin   CERVICAL FUSION  2001   COLONOSCOPY     DILATION AND CURETTAGE OF UTERUS     x 3   LAPAROSCOPIC BILATERAL SALPINGO OOPHERECTOMY Bilateral 12/21/2020   Procedure: LAPAROSCOPIC BILATERAL SALPINGO OOPHORECTOMY;  Surgeon: Ward, Honor Loh, MD;  Location: [facility] ORS;  Service: Gynecology;  Laterality: Bilateral;   VAGINAL DELIVERY     x4   WISDOM TOOTH EXTRACTION     Family History  Problem Relation Age of Onset   Pancreatic cancer Mother    Cancer Mother        Pancreatic   Arthritis Mother    Hyperlipidemia Mother    Hypertension Mother    Heart attack Father    Hyperlipidemia Father    Hypertension Father    Heart disease Father 107   Colon cancer Other    Cancer Other 93       Breast and Ovarian- dx 50's/Colon   Birth defects Maternal Aunt        Breast - in 51's   Stroke Maternal Grandmother    Birth defects Maternal Aunt        Breast - in 31's   Social History   Socioeconomic History   Marital status: Married    Spouse name: ED   Number of children: 4   Years of education: Not on file   Highest education level: Not on file  Occupational History   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    Social Determinants of Health   Financial Resource Strain: Low Risk    Difficulty of Paying Living Expenses: Not hard at all  Food Insecurity: No Food Insecurity   Worried About Charity fundraiser in the Last Year: Never true   [place] in the Last Year: Never true  Transportation Needs: No Transportation Needs   Lack of  Transportation (Medical): No   Lack of Transportation (Non-Medical): No  Physical Activity: Sufficiently Active   Days of Exercise per Week: 5 days   Minutes of Exercise per Session: 40 min  Stress: No Stress Concern Present   Feeling of Stress : Only a little  Social Connections: Unknown   Frequency of Communication with Friends and Family: More than three times a week   Frequency of Social Gatherings with Friends and Family: More than three times a week   Attends Religious Services: Not on Electrical engineer or Organizations: Not on file   Attends Archivist Meetings: Not on file   Marital Status: Married    Tobacco Counseling Counseling given: Not Answered   Clinical Intake:  Pre-visit preparation completed: Yes        Diabetes: No  How often do you need to have someone help you when you read instructions, pamphlets, or other written materials from your doctor or pharmacy?: 1 - Never     Activities of Daily Living    05/25/2022   11:31 AM  In your present state of health, do you have any difficulty performing the following activities:  Hearing? 0  Vision? 1  Difficulty concentrating or making decisions? 0  Walking or climbing stairs? 0  Dressing or bathing? 0  Comment Limits her driving due to vision changes. Family assist as needed.  Preparing Food and eating ? N  Using the Toilet? N  In the past six months, have you accidently leaked urine? N  Do you have problems with loss of bowel control? N  Managing your Medications? N  Managing your Finances? Y  Comment Husband assist as needed  Housekeeping or managing your Housekeeping? N    Patient Care Team: Einar Pheasant, MD as PCP - General (Internal Medicine) Kate Sable, MD as PCP - Cardiology (Cardiology)  Indicate any recent Medical Services you may have received from other than [facility] providers in the past year (date may be approximate).     Assessment:   This is a routine  wellness examination for Cindy Arnold.  Virtual Visit via Telephone Note  I connected with  Cindy Arnold on 05/25/22 at 10:00 AM EDT by telephone and verified that I am speaking with the correct person using two identifiers.  Persons participating in the virtual visit: patient/Nurse Health Advisor   I discussed the limitations of performing an evaluation and management service by telehealth. We continued and completed visit with audio only.Some vital signs may be absent or patient reported.   Hearing/Vision screen Hearing Screening - Comments:: Hearing aid, bilateral  Vision Screening - Comments:: Followed by [facility] Wears corrective lenses  CSR right eye Dry eye Left eye periodic fluid build up They have seen their ophthalmologist in the last 12 months  Dietary issues and exercise activities discussed: Current Exercise Habits: Home exercise routine, Intensity: Mild Regular diet   Goals Addressed               This Visit's Progress     Patient Stated     [place] (pt-stated)   On track     I plan to complete advanced directive        Depression Screen    05/25/2022   11:29 AM 03/19/2022    1:02 PM 04/28/2021    1:37 PM 03/21/2021    3:57 PM 03/15/2021   10:01 AM 04/27/2020   11:49 AM 04/27/2019   12:08 PM  PHQ 2/9 Scores  PHQ - 2 Score 0 0 0 0 0 0 0    Fall Risk    05/25/2022   11:31 AM 03/19/2022    1:02 PM 04/28/2021    1:37 PM 03/21/2021    3:57 PM 03/15/2021  10:01 AM  Fall Risk   Falls in the past year? 0 0 0 0 0  Number falls in past yr: 0  0 0 0  Injury with Fall?   0 0 0  Risk for fall due to :  No Fall Risks   No Fall Risks  Follow up Falls evaluation completed Falls evaluation completed Falls evaluation completed Falls evaluation completed Falls evaluation completed    [place]: Home free of loose throw rugs in walkways, pet beds, electrical cords, etc? Yes  Adequate lighting in your home to reduce risk  of falls? Yes   ASSISTIVE DEVICES UTILIZED TO PREVENT FALLS: Life alert? No  Use of a cane, walker or w/c? No   TIMED UP AND GO: Was the test performed? No .   Cognitive Function: Patient is alert and oriented x3.     04/27/2020   12:05 PM  MMSE - Mini Mental State Exam  Not completed: Unable to complete        04/27/2019   12:12 PM  6CIT Screen  What Year? 0 points  What month? 0 points  What time? 0 points  Count back from 20 0 points  Months in reverse 0 points  Repeat phrase 0 points  Total Score 0 points    Immunizations Immunization History  Administered Date(s) Administered   Fluad Quad(high Dose 65+) 09/15/2020, 10/05/2021   Influenza Split 11/14/2011, 10/02/2014   Influenza, High Dose Seasonal PF 09/26/2018, 10/03/2019   Influenza-Unspecified 10/22/2013, 08/26/2015, 10/03/2016   Moderna SARS-COV2 Booster Vaccination 12/30/2020   PFIZER(Purple Top)SARS-COV-2 Vaccination 03/14/2020, 04/06/2020   Pneumococcal Conjugate-13 08/21/2017   Pneumococcal Polysaccharide-23 11/05/2013, 12/27/2019   Tdap 05/14/2011, 07/08/2017   Zoster Recombinat (Shingrix) 11/15/2017, 03/04/2018   Zoster, Live 10/25/2011   Screening Tests Health Maintenance  Topic Date Due   COLONOSCOPY (Pts 45-33yr Insurance coverage will need to be confirmed)  02/05/2022   COVID-19 Vaccine (3 - Pfizer risk series) 06/10/2022 (Originally 01/27/2021)   INFLUENZA VACCINE  07/24/2022   MAMMOGRAM  01/24/2023   TETANUS/TDAP  07/09/2027   Pneumonia Vaccine 70 Years old  Completed   DEXA SCAN  Completed   Hepatitis C Screening  Completed   Zoster Vaccines- Shingrix  Completed   HPV VACCINES  Aged Out   Health Maintenance Health Maintenance Due  Topic Date Due   COLONOSCOPY (Pts 45-474yrInsurance coverage will need to be confirmed)  02/05/2022   Colonoscopy- ordered per consent.   Mammogram- completed with [facility] 01/2022.  Lung Cancer Screening: (Low Dose CT Chest recommended if Age 70-80ears,  30 pack-year currently smoking OR have quit w/in 15years.) does not qualify.   Vision Screening: Recommended annual ophthalmology exams for early detection of glaucoma and other disorders of the eye.  Dental Screening: Recommended annual dental exams for proper oral hygiene  Community Resource Referral / Chronic Care Management: CRR required this visit?  No   CCM required this visit?  No      Plan:   Keep all routine maintenance appointments.   I have personally reviewed and noted the following in the patient's chart:   Medical and social history Use of alcohol, tobacco or illicit drugs  Current medications and supplements including opioid prescriptions.  Functional ability and status Nutritional status Physical activity Advanced directives List of other physicians Hospitalizations, surgeries, and ER visits in previous 12 months Vitals Screenings to include cognitive, depression, and falls Referrals and appointments  In addition, I have reviewed and  discussed with patient certain preventive protocols, quality metrics, and best practice recommendations. A written personalized care plan for preventive services as well as general preventive health recommendations were provided to patient.     Varney Biles, LPN   02/28/3667

## 2022-05-29 DIAGNOSIS — H40033 Anatomical narrow angle, bilateral: Secondary | ICD-10-CM | POA: Diagnosis not present

## 2022-05-29 DIAGNOSIS — H2513 Age-related nuclear cataract, bilateral: Secondary | ICD-10-CM | POA: Diagnosis not present

## 2022-05-30 DIAGNOSIS — D0372 Melanoma in situ of left lower limb, including hip: Secondary | ICD-10-CM | POA: Diagnosis not present

## 2022-06-04 ENCOUNTER — Ambulatory Visit (INDEPENDENT_AMBULATORY_CARE_PROVIDER_SITE_OTHER): Payer: PPO | Admitting: Internal Medicine

## 2022-06-04 ENCOUNTER — Encounter: Payer: Self-pay | Admitting: Internal Medicine

## 2022-06-04 VITALS — BP 90/60 | HR 62 | Temp 98.5°F | Ht 68.5 in | Wt 142.0 lb

## 2022-06-04 DIAGNOSIS — J4520 Mild intermittent asthma, uncomplicated: Secondary | ICD-10-CM

## 2022-06-04 DIAGNOSIS — G4700 Insomnia, unspecified: Secondary | ICD-10-CM

## 2022-06-04 DIAGNOSIS — E7800 Pure hypercholesterolemia, unspecified: Secondary | ICD-10-CM

## 2022-06-04 DIAGNOSIS — H00016 Hordeolum externum left eye, unspecified eyelid: Secondary | ICD-10-CM | POA: Diagnosis not present

## 2022-06-04 DIAGNOSIS — F419 Anxiety disorder, unspecified: Secondary | ICD-10-CM | POA: Diagnosis not present

## 2022-06-04 DIAGNOSIS — I471 Supraventricular tachycardia: Secondary | ICD-10-CM

## 2022-06-04 DIAGNOSIS — Z853 Personal history of malignant neoplasm of breast: Secondary | ICD-10-CM | POA: Diagnosis not present

## 2022-06-04 DIAGNOSIS — K219 Gastro-esophageal reflux disease without esophagitis: Secondary | ICD-10-CM

## 2022-06-04 DIAGNOSIS — G43809 Other migraine, not intractable, without status migrainosus: Secondary | ICD-10-CM

## 2022-06-04 MED ORDER — TRAZODONE HCL 50 MG PO TABS: 50.0000 mg | ORAL_TABLET | Freq: Every evening | ORAL | 1 refills | Status: DC | PRN

## 2022-06-04 MED ORDER — BUTALBITAL-ASPIRIN-CAFFEINE 50-325-40 MG PO CAPS: 1.0000 | ORAL_CAPSULE | Freq: Two times a day (BID) | ORAL | 0 refills | Status: DC | PRN

## 2022-06-04 NOTE — Progress Notes (Unsigned)
[name], female   DOB: 25-Oct-1951, 71 y.o.   MRN: [id number]   Subjective:    [name], female    DOB: [birth year], 71 y.o.   MRN: [id number]   Patient here for a scheduled follow up.   Chief Complaint  Patient presents with   Follow-up    4 mo/ medication discussion   .   HPI    Past Medical History:  Diagnosis Date   Basal cell carcinoma    multiple removed/[facility]   Breast cancer ([place]) 12/13   s/p right lumpectomy, s/p chemo and XRT   DES exposure in utero    Hepatitis A    History of chicken pox    Hyperactive airway disease    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HCC)    Migraines    Miscarriage    x3   Past Surgical History:  Procedure Laterality Date   BASAL CELL CARCINOMA EXCISION     Multiple   BREAST BIOPSY  1975   Cyst   BREAST LUMPECTOMY WITH AXILLARY LYMPH NODE DISSECTION  01/06/13, 01/22/13   Dr. Drue Dun, [facility], second surgery for pos margin   CERVICAL FUSION  2001   COLONOSCOPY     DILATION AND CURETTAGE OF UTERUS     x 3   LAPAROSCOPIC BILATERAL SALPINGO OOPHERECTOMY Bilateral 12/21/2020   Procedure: LAPAROSCOPIC BILATERAL SALPINGO OOPHORECTOMY;  Surgeon: Ward, Honor Loh, MD;  Location: [facility] ORS;  Service: Gynecology;  Laterality: Bilateral;   VAGINAL DELIVERY     x4   WISDOM TOOTH EXTRACTION     Family History  Problem Relation Age of Onset   Pancreatic cancer Mother    Cancer Mother        Pancreatic   Arthritis Mother    Hyperlipidemia Mother    Hypertension Mother    Heart attack Father    Hyperlipidemia Father    Hypertension Father    Heart disease Father 73   Colon cancer Other    Cancer Other 38       Breast and Ovarian- dx 50's/Colon   Birth defects Maternal Aunt        Breast - in 40's   Stroke Maternal Grandmother    Birth defects Maternal Aunt        Breast - in 47's   Social History   Socioeconomic History   Marital status: Married    Spouse name: ED   Number of children: 4    Years of education: Not on file   Highest education level: Not on file  Occupational History   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    Social Determinants of Health   Financial Resource Strain: Low Risk  (05/25/2022)   Overall Financial Resource Strain (CARDIA)    Difficulty of Paying Living Expenses: Not hard at all  Food Insecurity: No Food Insecurity (05/25/2022)   Hunger Vital Sign    Worried About Running Out of Food in the Last Year: Never true    Ran Out of Food in  the Last Year: Never true  Transportation Needs: No Transportation Needs (05/25/2022)   PRAPARE - Hydrologist (Medical): No    Lack of Transportation (Non-Medical): No  Physical Activity: Sufficiently Active (05/25/2022)   Exercise Vital Sign    Days of Exercise per Week: 5 days    Minutes of Exercise per Session: 40 min  Stress: No Stress Concern Present (05/25/2022)   [place]    Feeling of Stress : Only a little  Social Connections: Unknown (05/25/2022)   Social Connection and Isolation Panel [NHANES]    Frequency of Communication with Friends and Family: More than three times a week    Frequency of Social Gatherings with Friends and Family: More than three times a week    Attends Religious Services: Not on Advertising copywriter or Organizations: Not on file    Attends Archivist Meetings: Not on file    Marital Status: Married     Review of Systems     Objective:     BP 90/60 (BP Location: Left Arm, Patient Position: Sitting,  Cuff Size: Normal)   Pulse 62   Temp 98.5 F (36.9 C) (Oral)   Ht 5' 8.5" (1.74 m)   Wt 142 lb (64.4 kg)   SpO2 97%   BMI 21.28 kg/m  Wt Readings from Last 3 Encounters:  06/04/22 142 lb (64.4 kg)  05/25/22 142 lb (64.4 kg)  03/23/22 142 lb 6.4 oz (64.6 kg)    Physical Exam   Outpatient Encounter Medications as of 06/04/2022  Medication Sig   albuterol (VENTOLIN HFA) 108 (90 Base) MCG/ACT inhaler Inhale 2 puffs into the lungs every 6 (six) hours as needed for wheezing or shortness of breath.   benzonatate (TESSALON) 200 MG capsule Take 1 capsule (200 mg total) by mouth 2 (two) times daily as needed for cough.   BIOTIN PO Take 10,000 mg by mouth daily.    butalbital-aspirin-caffeine (FIORINAL) 50-325-40 MG capsule Take 1 capsule by mouth 2 (two) times daily as needed for headache.   Cholecalciferol (VITAMIN D3) 125 MCG (5000 UT) CAPS Take 5,000 Units by mouth daily.   doxycycline (VIBRA-TABS) 100 MG tablet Take 1 tablet (100 mg total) by mouth 2 (two) times daily.   EVENING PRIMROSE OIL PO Take 1,300 mg by mouth.   Glucosamine-Chondroitin (MOVE FREE PO) Take 1 capsule by mouth daily. Cartilage/boron/hyalauronic acid   guaiFENesin-codeine (CHERATUSSIN AC) 100-10 MG/5ML syrup Take 5 mLs by mouth 3 (three) times daily as needed for cough.   hydrOXYzine (ATARAX) 10 MG tablet TAKE 1 TABLET BY MOUTH EVERY DAY AS NEEDED FOR ITCHING   ipratropium (ATROVENT) 0.06 % nasal spray SPRAY TWICE INTO EACH NOSTRIL TWICE DAILY   LORazepam (ATIVAN) 0.5 MG tablet TAKE 1 TABLET BY MOUTH DAILY AS NEEDED FOR ANXIETY   MAGNESIUM BISGLYCINATE PO Take 200 mg by mouth daily.   Multiple Minerals-Vitamins (CALCIUM CITRATE PLUS/MAGNESIUM PO) Take 2 tablets by mouth 2 (two) times daily. CAL CITRATE 500-MAGNESIUM 80-ZINC-10   Multiple Vitamins-Minerals (MULTIVITAMIN WOMENS 50+ ADV PO) Take 1 tablet by mouth daily.   Omega-3 Fatty Acids (OMEGA-3 FISH OIL PO) Take 1,280 mg by mouth 2 (two) times daily.    pantoprazole (PROTONIX) 20 MG tablet TAKE ONE TABLET BY MOUTH TWICE DAILY BEFORE A MEAL   predniSONE (DELTASONE) 20 MG tablet Take 2 tablets (40 mg total) by mouth daily with breakfast.   propranolol (  INDERAL) 40 MG tablet TAKE 1 TABLET BY MOUTH DAILY   Riboflavin (VITAMIN B2 PO) Take 250 mg by mouth daily.   sertraline (ZOLOFT) 50 MG tablet TAKE 1 TABLET BY MOUTH DAILY   zolpidem (AMBIEN) 5 MG tablet TAKE 1 TABLET BY MOUTH AT BEDTIME AS NEEDED   No facility-administered encounter medications on file as of 06/04/2022.     Lab Results  Component Value Date   WBC 5.7 10/03/2021   HGB 13.4 10/03/2021   HCT 39.1 10/03/2021   PLT 269.0 10/03/2021   GLUCOSE 93 05/17/2022   CHOL 198 05/17/2022   TRIG 67.0 05/17/2022   HDL 72.00 05/17/2022   LDLCALC 113 (H) 05/17/2022   ALT 17 05/17/2022   AST 23 05/17/2022   NA 142 05/17/2022   K 3.4 (L) 05/17/2022   CL 104 05/17/2022   CREATININE 0.86 05/17/2022   BUN 16 05/17/2022   CO2 29 05/17/2022   TSH 4.08 10/03/2021   MICROALBUR <0.7 11/14/2015       Assessment & Plan:   Problem List Items Addressed This Visit   None    Einar Pheasant, MD

## 2022-06-11 ENCOUNTER — Other Ambulatory Visit: Payer: Self-pay

## 2022-06-11 ENCOUNTER — Ambulatory Visit
Admission: RE | Admit: 2022-06-11 | Discharge: 2022-06-11 | Disposition: A | Discharge status: Home / Self Care | Payer: PPO | Attending: Gastroenterology | Admitting: Gastroenterology

## 2022-06-11 ENCOUNTER — Ambulatory Visit: Payer: PPO | Admitting: Certified Registered Nurse Anesthetist

## 2022-06-11 ENCOUNTER — Encounter
Admission: RE | Disposition: A | Discharge status: Home / Self Care | Payer: Self-pay | Source: Home / Self Care | Attending: Gastroenterology

## 2022-06-11 ENCOUNTER — Encounter: Payer: Self-pay | Admitting: Gastroenterology

## 2022-06-11 DIAGNOSIS — Z9221 Personal history of antineoplastic chemotherapy: Secondary | ICD-10-CM | POA: Insufficient documentation

## 2022-06-11 DIAGNOSIS — M329 Systemic lupus erythematosus, unspecified: Secondary | ICD-10-CM | POA: Diagnosis not present

## 2022-06-11 DIAGNOSIS — Z853 Personal history of malignant neoplasm of breast: Secondary | ICD-10-CM | POA: Diagnosis not present

## 2022-06-11 DIAGNOSIS — K644 Residual hemorrhoidal skin tags: Secondary | ICD-10-CM | POA: Insufficient documentation

## 2022-06-11 DIAGNOSIS — K635 Polyp of colon: Secondary | ICD-10-CM

## 2022-06-11 DIAGNOSIS — D122 Benign neoplasm of ascending colon: Secondary | ICD-10-CM | POA: Insufficient documentation

## 2022-06-11 DIAGNOSIS — K219 Gastro-esophageal reflux disease without esophagitis: Secondary | ICD-10-CM | POA: Diagnosis not present

## 2022-06-11 DIAGNOSIS — K648 Other hemorrhoids: Secondary | ICD-10-CM | POA: Diagnosis not present

## 2022-06-11 DIAGNOSIS — F419 Anxiety disorder, unspecified: Secondary | ICD-10-CM | POA: Diagnosis not present

## 2022-06-11 DIAGNOSIS — Z1211 Encounter for screening for malignant neoplasm of colon: Secondary | ICD-10-CM | POA: Diagnosis not present

## 2022-06-11 DIAGNOSIS — Z86010 Personal history of colonic polyps: Secondary | ICD-10-CM | POA: Insufficient documentation

## 2022-06-11 DIAGNOSIS — Z85828 Personal history of other malignant neoplasm of skin: Secondary | ICD-10-CM | POA: Insufficient documentation

## 2022-06-11 HISTORY — PX: COLONOSCOPY WITH PROPOFOL: SHX5780

## 2022-06-11 SURGERY — COLONOSCOPY WITH PROPOFOL
Anesthesia: General

## 2022-06-11 MED ORDER — SODIUM CHLORIDE 0.9 % IV SOLN: INTRAVENOUS | Status: DC

## 2022-06-11 MED ORDER — PROPOFOL 10 MG/ML IV BOLUS
INTRAVENOUS | Status: DC | PRN
  Administered 2022-06-11: 70 mg via INTRAVENOUS

## 2022-06-11 MED ORDER — PROPOFOL 500 MG/50ML IV EMUL
INTRAVENOUS | Status: DC | PRN
  Administered 2022-06-11: 170 ug/kg/min via INTRAVENOUS

## 2022-06-11 NOTE — Op Note (Signed)
[facility] Gastroenterology Patient Name: Cindy Arnold Procedure Date: 06/11/2022 11:10 AM MRN: [id number] Account #: [id number] Date of Birth: [birth year] Admit Type: Outpatient Age: 71 Room: [facility] ENDO ROOM 4 Gender: Female Note Status: Finalized Instrument Name: Peds Colonoscope [id number] Procedure:             Colonoscopy Indications:           High risk colon cancer surveillance: Personal history                         of colonic polyps, Last colonoscopy: October 2005,                         Last colonoscopy 5 years ago Providers:             Lin Landsman MD, MD Referring MD:          Einar Pheasant, MD (Referring MD) Medicines:             General Anesthesia Complications:         No immediate complications. Estimated blood loss: None. Procedure:             Pre-Anesthesia Assessment:                        - Prior to the procedure, a History and Physical was                         performed, and patient medications and allergies were                         reviewed. The patient is competent. The risks and                         benefits of the procedure and the sedation options and                         risks were discussed with the patient. All questions                         were answered and informed consent was obtained.                         Patient identification and proposed procedure were                         verified by the physician, the nurse, the                         anesthesiologist, the anesthetist and the technician                         in the pre-procedure area in the procedure room in the                         endoscopy suite. Mental Status Examination: alert and                         oriented. Airway Examination: normal oropharyngeal  airway and neck mobility. Respiratory Examination:                         clear to auscultation. CV Examination: normal.                         Prophylactic  Antibiotics: The patient does not require                         prophylactic antibiotics. Prior Anticoagulants: The                         patient has taken no previous anticoagulant or                         antiplatelet agents. ASA Grade Assessment: III - A                         patient with severe systemic disease. After reviewing                         the risks and benefits, the patient was deemed in                         satisfactory condition to undergo the procedure. The                         anesthesia plan was to use general anesthesia.                         Immediately prior to administration of medications,                         the patient was re-assessed for adequacy to receive                         sedatives. The heart rate, respiratory rate, oxygen                         saturations, blood pressure, adequacy of pulmonary                         ventilation, and response to care were monitored                         throughout the procedure. The physical status of the                         patient was re-assessed after the procedure.                        After obtaining informed consent, the colonoscope was                         passed under direct vision. Throughout the procedure,                         the patient's blood pressure, pulse, and oxygen  saturations were monitored continuously. The                         Colonoscope was introduced through the anus and                         advanced to the the cecum, identified by appendiceal                         orifice and ileocecal valve. The colonoscopy was                         performed without difficulty. The patient tolerated                         the procedure well. The quality of the bowel                         preparation was evaluated using the BBPS [facility] Bowel                         Preparation Scale) with scores of: Right Colon = 3,                          Transverse Colon = 3 and Left Colon = 3 (entire mucosa                         seen well with no residual staining, small fragments                         of stool or opaque liquid). The total BBPS score                         equals 9. Findings:      Skin tags were found on perianal exam.      A diminutive polyp was found in the cecum. The polyp was sessile. The       polyp was removed with a cold biopsy forceps. Resection and retrieval       were complete.      A 4 mm polyp was found in the mid ascending colon. The polyp was       sessile. The polyp was removed with a cold snare. Resection and       retrieval were complete.      External and internal hemorrhoids were found during retroflexion. The       hemorrhoids were medium-sized.      The exam was otherwise without abnormality. Impression:            - Perianal skin tags found on perianal exam.                        - One diminutive polyp in the cecum, removed with a                         cold biopsy forceps. Resected and retrieved.                        - One 4 mm polyp in  the mid ascending colon, removed                         with a cold snare. Resected and retrieved.                        - External and internal hemorrhoids.                        - The examination was otherwise normal. Recommendation:        - Discharge patient to home (with escort).                        - Resume previous diet today.                        - Continue present medications.                        - Await pathology results.                        - Repeat colonoscopy in 7-10 years for surveillance                         based on pathology results. Procedure Code(s):     --- Professional ---                        [phone], Colonoscopy, flexible; with removal of                         tumor(s), polyp(s), or other lesion(s) by snare                         technique                        45380, 11, Colonoscopy, flexible; with  biopsy, single                         or multiple Diagnosis Code(s):     --- Professional ---                        Z86.010, Personal history of colonic polyps                        K63.5, Polyp of colon                        K64.8, Other hemorrhoids                        K64.4, Residual hemorrhoidal skin tags CPT copyright 2019 American Medical Association. All rights reserved. The codes documented in this report are preliminary and upon coder review may  be revised to meet current compliance requirements. Dr. Ulyess Mort Lin Landsman MD, MD 06/11/2022 11:52:13 AM This report has been signed electronically. Number of Addenda: 0 Note Initiated On: 06/11/2022 11:10 AM Scope Withdrawal Time: 0 hours 12 minutes 48 seconds  Total Procedure Duration: 0 hours 23 minutes 7 seconds  Estimated Blood Loss:  Estimated blood loss: none.      [facility]

## 2022-06-11 NOTE — H&P (Addendum)
Cephas Darby, MD [address]  [place]  [place], [place] [address]  Main: [phone]  Fax: [phone] Pager: [phone]  Primary Care Physician:  Einar Pheasant, MD Primary Gastroenterologist:  Dr. Cephas Darby  Pre-Procedure History & Physical: HPI:  Cindy Arnold is a 71 y.o. female is here for an colonoscopy.   Past Medical History:  Diagnosis Date   Basal cell carcinoma    multiple removed/[facility]   Breast cancer ([place]) 12/13   s/p right lumpectomy, s/p chemo and XRT   DES exposure in utero    Hepatitis A    History of chicken pox    Hyperactive airway disease    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HCC)    Migraines    Miscarriage    x3    Past Surgical History:  Procedure Laterality Date   ABDOMINAL HYSTERECTOMY     BASAL CELL CARCINOMA EXCISION     Multiple   BREAST BIOPSY  1975   Cyst   BREAST LUMPECTOMY WITH AXILLARY LYMPH NODE DISSECTION  01/06/13, 01/22/13   Dr. Drue Dun, [facility], second surgery for pos margin   CERVICAL FUSION  2001   COLONOSCOPY     DILATION AND CURETTAGE OF UTERUS     x 3   LAPAROSCOPIC BILATERAL SALPINGO OOPHERECTOMY Bilateral 12/21/2020   Procedure: LAPAROSCOPIC BILATERAL SALPINGO OOPHORECTOMY;  Surgeon: Ward, Honor Loh, MD;  Location: [facility] ORS;  Service: Gynecology;  Laterality: Bilateral;   VAGINAL DELIVERY     x4   WISDOM TOOTH EXTRACTION      Prior to Admission medications   Medication Sig Start Date End Date Taking? Authorizing Provider  albuterol (VENTOLIN HFA) 108 (90 Base) MCG/ACT inhaler Inhale 2 puffs into the lungs every 6 (six) hours as needed for wheezing or shortness of breath. 10/13/20  Yes Einar Pheasant, MD  BIOTIN PO Take 10,000 mg by mouth daily.    Yes [provider]  butalbital-aspirin-caffeine Acquanetta Chain) 50-325-40 MG capsule Take 1 capsule by mouth 2 (two) times daily as needed for headache. 06/04/22  Yes Einar Pheasant, MD  Cholecalciferol (VITAMIN D3) 125 MCG (5000 UT) CAPS  Take 5,000 Units by mouth daily. 12/24/17  Yes [provider]  EVENING PRIMROSE OIL PO Take 1,300 mg by mouth.   Yes [provider]  Glucosamine-Chondroitin (MOVE FREE PO) Take 1 capsule by mouth daily. Cartilage/boron/hyalauronic acid   Yes [provider]  ipratropium (ATROVENT) 0.06 % nasal spray SPRAY TWICE INTO EACH NOSTRIL TWICE DAILY 05/01/22  Yes Einar Pheasant, MD  LORazepam (ATIVAN) 0.5 MG tablet TAKE 1 TABLET BY MOUTH DAILY AS NEEDED FOR ANXIETY 01/23/22  Yes Einar Pheasant, MD  MAGNESIUM BISGLYCINATE PO Take 200 mg by mouth daily.   Yes [provider]  Multiple Minerals-Vitamins (CALCIUM CITRATE PLUS/MAGNESIUM PO) Take 2 tablets by mouth 2 (two) times daily. CAL CITRATE 500-MAGNESIUM 80-ZINC-10   Yes [provider]  Multiple Vitamins-Minerals (MULTIVITAMIN WOMENS 50+ ADV PO) Take 1 tablet by mouth daily.   Yes [provider]  Omega-3 Fatty Acids (OMEGA-3 FISH OIL PO) Take 1,280 mg by mouth 2 (two) times daily.   Yes [provider]  pantoprazole (PROTONIX) 20 MG tablet TAKE ONE TABLET BY MOUTH TWICE DAILY BEFORE A MEAL 09/25/21  Yes Einar Pheasant, MD  propranolol (INDERAL) 40 MG tablet TAKE 1 TABLET BY MOUTH DAILY 04/13/22  Yes Einar Pheasant, MD  Riboflavin (VITAMIN B2 PO) Take 250 mg by mouth daily.   Yes [provider]  sertraline (  ZOLOFT) 50 MG tablet TAKE 1 TABLET BY MOUTH DAILY 04/16/22  Yes Einar Pheasant, MD  traZODone (DESYREL) 50 MG tablet Take 1 tablet (50 mg total) by mouth at bedtime as needed for sleep. 06/04/22  Yes Einar Pheasant, MD  benzonatate (TESSALON) 200 MG capsule Take 1 capsule (200 mg total) by mouth 2 (two) times daily as needed for cough. Patient not taking: Reported on 06/11/2022 03/19/22   Crecencio Mc, MD  doxycycline (VIBRA-TABS) 100 MG tablet Take 1 tablet (100 mg total) by mouth 2 (two) times daily. Patient not taking: Reported on 06/11/2022 03/23/22   Leone Haven, MD   guaiFENesin-codeine [facility]) 100-10 MG/5ML syrup Take 5 mLs by mouth 3 (three) times daily as needed for cough. Patient not taking: Reported on 06/11/2022 03/19/22   Crecencio Mc, MD  hydrOXYzine (ATARAX) 10 MG tablet TAKE 1 TABLET BY MOUTH EVERY DAY AS NEEDED FOR ITCHING 02/06/22   Dutch Quint B, FNP  predniSONE (DELTASONE) 20 MG tablet Take 2 tablets (40 mg total) by mouth daily with breakfast. Patient not taking: Reported on 06/11/2022 03/23/22   Leone Haven, MD    Allergies as of 05/25/2022 - Review Complete 05/25/2022  Allergen Reaction Noted   Prednisone Other (See Comments) 06/18/2021    Family History  Problem Relation Age of Onset   Pancreatic cancer Mother    Cancer Mother        Pancreatic   Arthritis Mother    Hyperlipidemia Mother    Hypertension Mother    Heart attack Father    Hyperlipidemia Father    Hypertension Father    Heart disease Father 58   Colon cancer Other    Cancer Other 20       Breast and Ovarian- dx 50's/Colon   Birth defects Maternal Aunt        Breast - in 75's   Stroke Maternal Grandmother    Birth defects Maternal Aunt        Breast - in 32's    Social History   Socioeconomic History   Marital status: Married    Spouse name: ED   Number of children: 4   Years of education: Not on file   Highest education level: Not on file  Occupational History   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    Social Determinants of Health   Financial Resource Strain: Low Risk  (05/25/2022)   Overall Financial Resource Strain (CARDIA)    Difficulty of Paying Living  Expenses: Not hard at all  Food Insecurity: No Food Insecurity (05/25/2022)   Hunger Vital Sign    Worried About Running Out of Food in the Last Year: Never true    Ran Out of Food in the Last Year: Never true  Transportation Needs: No Transportation Needs (05/25/2022)   PRAPARE - Hydrologist (Medical): No    Lack of Transportation (Non-Medical): No  Physical Activity: Sufficiently Active (05/25/2022)   Exercise Vital Sign    Days of Exercise per Week: 5  days    Minutes of Exercise per Session: 40 min  Stress: No Stress Concern Present (05/25/2022)   [place]    Feeling of Stress : Only a little  Social Connections: Unknown (05/25/2022)   Social Connection and Isolation Panel [NHANES]    Frequency of Communication with Friends and Family: More than three times a week    Frequency of Social Gatherings with Friends and Family: More than three times a week    Attends Religious Services: Not on file    Active Member of Clubs or Organizations: Not on file    Attends Archivist Meetings: Not on file    Marital Status: Married  Intimate Partner Violence: Not At Risk (05/25/2022)   Humiliation, Afraid, Rape, and Kick questionnaire    Fear of Current or Ex-Partner: No    Emotionally Abused: No    Physically Abused: No    Sexually Abused: No    Review of Systems: See HPI, otherwise negative ROS  Physical Exam: BP 125/87   Pulse 76   Temp 97.7 F (36.5 C) (Temporal)   Resp 16   Ht 5' 8.5" (1.74 m)   Wt 64.4 kg   SpO2 100%   BMI 21.27 kg/m  General:   Alert,  pleasant and cooperative in NAD Head:  Normocephalic and atraumatic. Neck:  Supple; no masses or thyromegaly. Lungs:  Clear throughout to auscultation.    Heart:  Regular rate and rhythm. Abdomen:  Soft, nontender and nondistended. Normal bowel sounds, without guarding, and without rebound.   Neurologic:  Alert and  oriented x4;   grossly normal neurologically.  Impression/Plan: Cindy Arnold is here for an colonoscopy to be performed for h/o colon adenoma  Risks, benefits, limitations, and alternatives regarding  colonoscopy have been reviewed with the patient.  Questions have been answered.  All parties agreeable.   Sherri Sear, MD  06/11/2022, 11:15 AM

## 2022-06-11 NOTE — Transfer of Care (Signed)
Immediate Anesthesia Transfer of Care Note  Patient: Cindy Arnold  Procedure(s) Performed: COLONOSCOPY WITH PROPOFOL  Patient Location: PACU  Anesthesia Type:General  Level of Consciousness: drowsy  Airway & Oxygen Therapy: Patient Spontanous Breathing  Post-op Assessment: Report given to RN and Post -op Vital signs reviewed and stable  Post vital signs: Reviewed and stable  Last Vitals:  Vitals Value Taken Time  BP    Temp    Pulse    Resp    SpO2      Last Pain:  Vitals:   06/11/22 1052  TempSrc: Temporal         Complications: No notable events documented.

## 2022-06-11 NOTE — Anesthesia Preprocedure Evaluation (Signed)
Anesthesia Evaluation  Patient identified by MRN, date of birth, ID band Patient awake    Reviewed: Allergy & Precautions, H&P , NPO status , Patient's Chart, lab work & pertinent test results, reviewed documented beta blocker date and time   Airway Mallampati: II   Neck ROM: full    Dental  (+) Poor Dentition   Pulmonary asthma ,    Pulmonary exam normal        Cardiovascular Exercise Tolerance: Good negative cardio ROS Normal cardiovascular exam Rhythm:regular Rate:Normal     Neuro/Psych  Headaches, Anxiety  Neuromuscular disease negative psych ROS   GI/Hepatic GERD  Medicated,(+) Hepatitis -  Endo/Other  negative endocrine ROS  Renal/GU negative Renal ROS  negative genitourinary   Musculoskeletal   Abdominal   Peds  Hematology negative hematology ROS (+)   Anesthesia Other Findings Past Medical History: No date: Basal cell carcinoma     Comment:  multiple removed/[facility] 12/13: Breast cancer ([place])     Comment:  s/p right lumpectomy, s/p chemo and XRT No date: DES exposure in utero No date: Hepatitis A No date: History of chicken pox No date: Hyperactive airway disease No date: 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[place]) No date: Migraines No date: Miscarriage     Comment:  x3 Past Surgical History: No date: ABDOMINAL HYSTERECTOMY No date: BASAL CELL CARCINOMA EXCISION     Comment:  Multiple 1975: BREAST BIOPSY     Comment:  Cyst 01/06/13, 01/22/13: BREAST LUMPECTOMY WITH AXILLARY LYMPH NODE  DISSECTION     Comment:  Dr. Drue Dun, [facility], second surgery for pos margin 2001: CERVICAL FUSION No date: COLONOSCOPY No date: DILATION AND CURETTAGE OF UTERUS     Comment:  x 3 12/21/2020: LAPAROSCOPIC BILATERAL SALPINGO OOPHERECTOMY; Bilateral     Comment:  Procedure: LAPAROSCOPIC BILATERAL SALPINGO OOPHORECTOMY;              Surgeon: Ward, Honor Loh, MD;  Location: [facility] ORS;                Service:  Gynecology;  Laterality: Bilateral; No date: VAGINAL DELIVERY     Comment:  x4 No date: WISDOM TOOTH EXTRACTION BMI    Body Mass Index: 21.27 kg/m     Reproductive/Obstetrics negative OB ROS                             Anesthesia Physical Anesthesia Plan  ASA: 3  Anesthesia Plan: General   Post-op Pain Management:    Induction:   PONV Risk Score and Plan:   Airway Management Planned:   Additional Equipment:   Intra-op Plan:   Post-operative Plan:   Informed Consent: I have reviewed the patients History and Physical, chart, labs and discussed the procedure including the risks, benefits and alternatives for the proposed anesthesia with the patient or authorized representative who has indicated his/her understanding and acceptance.     Dental Advisory Given  Plan Discussed with: CRNA  Anesthesia Plan Comments:         Anesthesia Quick Evaluation

## 2022-06-12 ENCOUNTER — Encounter: Payer: Self-pay | Admitting: Gastroenterology

## 2022-06-12 DIAGNOSIS — D0461 Carcinoma in situ of skin of right upper limb, including shoulder: Secondary | ICD-10-CM | POA: Diagnosis not present

## 2022-06-12 LAB — SURGICAL PATHOLOGY

## 2022-06-12 NOTE — Anesthesia Postprocedure Evaluation (Signed)
Anesthesia Post Note  Patient: Cindy Arnold  Procedure(s) Performed: COLONOSCOPY WITH PROPOFOL  Patient location during evaluation: PACU Anesthesia Type: General Level of consciousness: awake and alert Pain management: pain level controlled Vital Signs Assessment: post-procedure vital signs reviewed and stable Respiratory status: spontaneous breathing, nonlabored ventilation, respiratory function stable and patient connected to nasal cannula oxygen Cardiovascular status: blood pressure returned to baseline and stable Postop Assessment: no apparent nausea or vomiting Anesthetic complications: no   No notable events documented.   Last Vitals:  Vitals:   06/11/22 1202 06/11/22 1212  BP: 136/72 (!) 160/86  Pulse: 61 61  Resp: 14 (!) 22  Temp:    SpO2: 100% 97%    Last Pain:  Vitals:   06/12/22 0739  TempSrc:   PainSc: 0-No pain                 Molli Barrows

## 2022-06-13 ENCOUNTER — Other Ambulatory Visit: Payer: Self-pay | Admitting: Internal Medicine

## 2022-06-17 ENCOUNTER — Encounter: Payer: Self-pay | Admitting: Internal Medicine

## 2022-06-17 DIAGNOSIS — I471 Supraventricular tachycardia, unspecified: Secondary | ICD-10-CM | POA: Insufficient documentation

## 2022-06-17 DIAGNOSIS — H00019 Hordeolum externum unspecified eye, unspecified eyelid: Secondary | ICD-10-CM | POA: Insufficient documentation

## 2022-06-17 NOTE — Assessment & Plan Note (Signed)
Mammogram 01/29/22 - Birads I.

## 2022-06-17 NOTE — Assessment & Plan Note (Signed)
Stable.  Request refill to have fiorinal if needed.  Follow.

## 2022-06-20 ENCOUNTER — Telehealth: Payer: Self-pay | Admitting: Internal Medicine

## 2022-06-20 NOTE — Telephone Encounter (Signed)
Pt called stating she has numbness in her toes and feet for about a month. Pt stated there is no pain. Sent to access nurse

## 2022-06-20 NOTE — Telephone Encounter (Signed)
S/w pt - stated she was not concerned regarding the numbness in toes, unsure of why she was sent to emergency #. Stated there is not pain, or discoloration. It is not total numbness. Has family hx of neuropathy, just wonders if that is what she is getting. Forgot to mention to [facility] at wellness visit, and to Dr Nicki Reaper at office visit. Was talking to a friend and the subject of neuropathy came up and that is what made her remember. Suggested pt come in sooner then August. Pt agreeable, but is going away 6/29 - 7/15. Pt scheduled for 7/18.

## 2022-06-27 ENCOUNTER — Other Ambulatory Visit: Payer: Self-pay | Admitting: Internal Medicine

## 2022-07-02 ENCOUNTER — Other Ambulatory Visit: Payer: Self-pay | Admitting: Internal Medicine

## 2022-07-10 ENCOUNTER — Ambulatory Visit (INDEPENDENT_AMBULATORY_CARE_PROVIDER_SITE_OTHER): Payer: PPO | Admitting: Internal Medicine

## 2022-07-10 ENCOUNTER — Encounter: Payer: Self-pay | Admitting: Internal Medicine

## 2022-07-10 VITALS — BP 104/60 | HR 64 | Temp 98.0°F | Resp 20 | Ht 68.0 in | Wt 141.2 lb

## 2022-07-10 DIAGNOSIS — Z1231 Encounter for screening mammogram for malignant neoplasm of breast: Secondary | ICD-10-CM

## 2022-07-10 DIAGNOSIS — F419 Anxiety disorder, unspecified: Secondary | ICD-10-CM | POA: Diagnosis not present

## 2022-07-10 DIAGNOSIS — K219 Gastro-esophageal reflux disease without esophagitis: Secondary | ICD-10-CM | POA: Diagnosis not present

## 2022-07-10 DIAGNOSIS — R200 Anesthesia of skin: Secondary | ICD-10-CM | POA: Diagnosis not present

## 2022-07-10 DIAGNOSIS — I471 Supraventricular tachycardia: Secondary | ICD-10-CM | POA: Diagnosis not present

## 2022-07-10 DIAGNOSIS — E7800 Pure hypercholesterolemia, unspecified: Secondary | ICD-10-CM | POA: Diagnosis not present

## 2022-07-10 DIAGNOSIS — E559 Vitamin D deficiency, unspecified: Secondary | ICD-10-CM | POA: Diagnosis not present

## 2022-07-10 DIAGNOSIS — G43809 Other migraine, not intractable, without status migrainosus: Secondary | ICD-10-CM | POA: Diagnosis not present

## 2022-07-10 LAB — VITAMIN B12: Vitamin B-12: 524 pg/mL (ref 211–911)

## 2022-07-10 LAB — CBC WITH DIFFERENTIAL/PLATELET
Basophils Absolute: 0.1 10*3/uL (ref 0.0–0.1)
Basophils Relative: 1.4 % (ref 0.0–3.0)
Eosinophils Absolute: 0.1 10*3/uL (ref 0.0–0.7)
Eosinophils Relative: 1.2 % (ref 0.0–5.0)
HCT: 39.3 % (ref 36.0–46.0)
Hemoglobin: 13.4 g/dL (ref 12.0–15.0)
Lymphocytes Relative: 30.5 % (ref 12.0–46.0)
Lymphs Abs: 1.8 10*3/uL (ref 0.7–4.0)
MCHC: 34 g/dL (ref 30.0–36.0)
MCV: 91.7 fl (ref 78.0–100.0)
Monocytes Absolute: 0.6 10*3/uL (ref 0.1–1.0)
Monocytes Relative: 10.2 % (ref 3.0–12.0)
Neutro Abs: 3.3 10*3/uL (ref 1.4–7.7)
Neutrophils Relative %: 56.7 % (ref 43.0–77.0)
Platelets: 250 10*3/uL (ref 150.0–400.0)
RBC: 4.28 Mil/uL (ref 3.87–5.11)
RDW: 13 % (ref 11.5–15.5)
WBC: 5.9 10*3/uL (ref 4.0–10.5)

## 2022-07-10 LAB — BASIC METABOLIC PANEL
BUN: 18 mg/dL (ref 6–23)
CO2: 28 mEq/L (ref 19–32)
Calcium: 9.6 mg/dL (ref 8.4–10.5)
Chloride: 100 mEq/L (ref 96–112)
Creatinine, Ser: 0.81 mg/dL (ref 0.40–1.20)
GFR: 73.68 mL/min (ref >60.00)
Glucose, Bld: 92 mg/dL (ref 70–99)
Potassium: 4.2 mEq/L (ref 3.5–5.1)
Sodium: 138 mEq/L (ref 135–145)

## 2022-07-10 LAB — TSH: TSH: 2.53 u[IU]/mL (ref 0.35–5.50)

## 2022-07-10 LAB — VITAMIN D 25 HYDROXY (VIT D DEFICIENCY, FRACTURES): VITD: 90.25 ng/mL (ref 30.00–100.00)

## 2022-07-10 LAB — SEDIMENTATION RATE: Sed Rate: 14 mm/hr (ref 0–30)

## 2022-07-10 NOTE — Progress Notes (Unsigned)
[name], female   DOB: Dec 06, 1951, 71 y.o.   MRN: [id number]   Subjective:    [name], female    DOB: April 21, 1951, 71 y.o.   MRN: [id number]   Patient here for work in appt .   HPI Work in to discuss numbness in toes.  Noticed recently - numbness - bilateral toes.  No numbness extending up foot or leg.  Gait is a little more unsteady.  Also has noticed intermittent numbness in right and left hand/arm.  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  Has a brace.  Some neck issues.  Some headache at times. Intermittent flares.  Tries to stay active.  No chest pain or sob reported.  No motor weakness.  Eating and drinking ok.     Past Medical History:  Diagnosis Date   Basal cell carcinoma    multiple removed/[facility]   Breast cancer ([facility]) 12/13   s/p right lumpectomy, s/p chemo and XRT   DES exposure in utero    Hepatitis A    History of chicken pox    Hyperactive airway disease    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HCC)    Migraines    Miscarriage    x3   Past Surgical History:  Procedure Laterality Date   ABDOMINAL HYSTERECTOMY     BASAL CELL CARCINOMA EXCISION     Multiple   BREAST BIOPSY  1975   Cyst   BREAST LUMPECTOMY WITH AXILLARY LYMPH NODE DISSECTION  01/06/13, 01/22/13   Dr. Drue Dun, [place], second surgery for pos margin   CERVICAL FUSION  2001   COLONOSCOPY     COLONOSCOPY WITH PROPOFOL N/A 06/11/2022   Procedure: COLONOSCOPY WITH PROPOFOL;  Surgeon: Lin Landsman, MD;  Location: [facility];  Service: Gastroenterology;  Laterality: N/A;   DILATION AND CURETTAGE OF UTERUS     x 3   LAPAROSCOPIC BILATERAL SALPINGO OOPHERECTOMY Bilateral 12/21/2020   Procedure: LAPAROSCOPIC BILATERAL SALPINGO OOPHORECTOMY;  Surgeon: Ward, Honor Loh, MD;  Location: [facility];  Service: Gynecology;  Laterality: Bilateral;   VAGINAL DELIVERY     x4   WISDOM TOOTH EXTRACTION     Family History  Problem Relation Age of Onset   Pancreatic cancer  Mother    Cancer Mother        Pancreatic   Arthritis Mother    Hyperlipidemia Mother    Hypertension Mother    Heart attack Father    Hyperlipidemia Father    Hypertension Father    Heart disease Father 76   Colon cancer Other    Cancer Other 78       Breast and Ovarian- dx 50's/Colon   Birth defects Maternal Aunt        Breast - in 74's   Stroke Maternal Grandmother    Birth defects Maternal Aunt        Breast - in 23's   Social History   Socioeconomic History   Marital status: Married    Spouse name: ED   Number of children: 4   Years of education: Not on file   Highest education level: Not on file  Occupational History   Occupation: Corporate treasurer - [employer]  Tobacco Use   Smoking status: Never   Smokeless tobacco: Never  Substance and Sexual Activity   Alcohol use: Yes    Alcohol/week: 0.0 standard drinks of alcohol    Comment: 3 times a week   Drug use: No   Sexual  activity: Not on file  Other Topics Concern   Not on file  Social History Narrative   Lives in [place] with husband.  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    Social Determinants of Health   Financial Resource Strain: Low Risk  (05/25/2022)   Overall Financial Resource Strain (CARDIA)    Difficulty of Paying Living Expenses: Not hard at all  Food Insecurity: No Food Insecurity (05/25/2022)   Hunger Vital Sign    Worried About Running Out of Food in the Last Year: Never true    Ran Out of Food in the Last Year: Never true  Transportation Needs: No Transportation Needs (05/25/2022)   PRAPARE - Hydrologist (Medical): No    Lack of Transportation (Non-Medical): No  Physical Activity: Sufficiently Active (05/25/2022)   Exercise Vital Sign    Days of Exercise per Week: 5 days    Minutes of Exercise per Session: 40 min  Stress: No Stress Concern Present (05/25/2022)   [place]    Feeling of Stress : Only a little   Social Connections: Unknown (05/25/2022)   Social Connection and Isolation Panel [NHANES]    Frequency of Communication with Friends and Family: More than three times a week    Frequency of Social Gatherings with Friends and Family: More than three times a week    Attends Religious Services: Not on Advertising copywriter or Organizations: Not on file    Attends Archivist Meetings: Not on file    Marital Status: Married     Review of Systems  Constitutional:  Negative for appetite change and unexpected weight change.  HENT:  Negative for congestion and sinus pressure.   Respiratory:  Negative for cough, chest tightness and shortness of breath.   Cardiovascular:  Negative for chest pain and palpitations.  Gastrointestinal:  Negative for abdominal pain, diarrhea, nausea and vomiting.  Genitourinary:  Negative for difficulty urinating and dysuria.  Musculoskeletal:  Positive for neck pain. Negative for joint swelling and myalgias.  Skin:  Negative for color change and rash.  Neurological:        Some intermittent headache flares as outlined.  No dizziness.    Psychiatric/Behavioral:  Negative for agitation and dysphoric mood.        Objective:     BP 104/60 (BP Location: Left Arm, Patient Position: Sitting, Cuff Size: Normal)   Pulse 64   Temp 98 F (36.7 C) (Oral)   Resp 20   Ht '5\' 8"'$  (1.727 m)   Wt 141 lb 3.2 oz (64 kg)   SpO2 98%   BMI 21.47 kg/m  Wt Readings from Last 3 Encounters:  07/10/22 141 lb 3.2 oz (64 kg)  06/11/22 141 lb 15.6 oz (64.4 kg)  06/04/22 142 lb (64.4 kg)    Physical Exam Vitals reviewed.  Constitutional:      General: She is not in acute distress.    Appearance: Normal appearance.  HENT:     Head: Normocephalic and atraumatic.     Right Ear: External ear normal.     Left Ear: External ear normal.  Eyes:     General: No scleral icterus.       Right eye: No discharge.        Left eye: No discharge.     Conjunctiva/sclera:  Conjunctivae normal.  Neck:     Thyroid: No thyromegaly.  Cardiovascular:  Rate and Rhythm: Normal rate and regular rhythm.  Pulmonary:     Effort: No respiratory distress.     Breath sounds: Normal breath sounds. No wheezing.  Abdominal:     General: Bowel sounds are normal.     Palpations: Abdomen is soft.     Tenderness: There is no abdominal tenderness.  Musculoskeletal:        General: No swelling or tenderness.     Cervical back: Neck supple. No tenderness.  Lymphadenopathy:     Cervical: No cervical adenopathy.  Skin:    Findings: No erythema or rash.  Neurological:     Mental Status: She is alert.  Psychiatric:        Mood and Affect: Mood normal.        Behavior: Behavior normal.      Outpatient Encounter Medications as of 07/10/2022  Medication Sig   albuterol (VENTOLIN HFA) 108 (90 Base) MCG/ACT inhaler Inhale 2 puffs into the lungs every 6 (six) hours as needed for wheezing or shortness of breath.   BIOTIN PO Take 10,000 mg by mouth daily.    butalbital-aspirin-caffeine (FIORINAL) 50-325-40 MG capsule Take 1 capsule by mouth 2 (two) times daily as needed for headache.   Cholecalciferol (VITAMIN D3) 125 MCG (5000 UT) CAPS Take 5,000 Units by mouth daily.   EVENING PRIMROSE OIL PO Take 1,300 mg by mouth.   Glucosamine-Chondroitin (MOVE FREE PO) Take 1 capsule by mouth daily. Cartilage/boron/hyalauronic acid   hydrOXYzine (ATARAX) 10 MG tablet TAKE 1 TABLET BY MOUTH EVERY DAY AS NEEDED FOR ITCHING   ipratropium (ATROVENT) 0.06 % nasal spray SPRAY TWICE INTO EACH NOSTRIL TWICE DAILY   LORazepam (ATIVAN) 0.5 MG tablet TAKE 1 TABLET BY MOUTH DAILY AS NEEDED FOR ANXIETY   MAGNESIUM BISGLYCINATE PO Take 200 mg by mouth daily.   Multiple Minerals-Vitamins (CALCIUM CITRATE PLUS/MAGNESIUM PO) Take 2 tablets by mouth 2 (two) times daily. CAL CITRATE 500-MAGNESIUM 80-ZINC-10   Multiple Vitamins-Minerals (MULTIVITAMIN WOMENS 50+ ADV PO) Take 1 tablet by mouth daily.    Omega-3 Fatty Acids (OMEGA-3 FISH OIL PO) Take 1,280 mg by mouth 2 (two) times daily.   pantoprazole (PROTONIX) 20 MG tablet TAKE ONE TABLET BY MOUTH TWICE DAILY BEFORE A MEAL   propranolol (INDERAL) 40 MG tablet TAKE 1 TABLET BY MOUTH DAILY   Riboflavin (VITAMIN B2 PO) Take 250 mg by mouth daily.   sertraline (ZOLOFT) 50 MG tablet TAKE 1 TABLET BY MOUTH DAILY   traZODone (DESYREL) 50 MG tablet TAKE ONE TABLET AT BEDTIME AS NEEDED FORSLEEP   No facility-administered encounter medications on file as of 07/10/2022.     Lab Results  Component Value Date   WBC 5.9 07/10/2022   HGB 13.4 07/10/2022   HCT 39.3 07/10/2022   PLT 250.0 07/10/2022   GLUCOSE 92 07/10/2022   CHOL 198 05/17/2022   TRIG 67.0 05/17/2022   HDL 72.00 05/17/2022   LDLCALC 113 (H) 05/17/2022   ALT 17 05/17/2022   AST 23 05/17/2022   NA 138 07/10/2022   K 4.2 07/10/2022   CL 100 07/10/2022   CREATININE 0.81 07/10/2022   BUN 18 07/10/2022   CO2 28 07/10/2022   TSH 2.53 07/10/2022   MICROALBUR <0.7 11/14/2015       Assessment & Plan:   Problem List Items Addressed This Visit     Anxiety    Overall appears to be handling things relatively well.  On zoloft. Follow closely.       GERD (gastroesophageal  reflux disease)    No upper symptoms reported. On protonix.       Hypercholesterolemia    The 10-year ASCVD risk score (Arnett DK, et al., 2019) is: 6.1%   Values used to calculate the score:     Age: 11 years     Sex: Female     Is Non-Hispanic African American: No     Diabetic: No     Tobacco smoker: No     Systolic Blood Pressure: 414 mmHg     Is BP treated: No     HDL Cholesterol: 72 mg/dL     Total Cholesterol: 198 mg/dL  Low cholesterol diet and exercise.  Follow lipid panel. Once acute issues sorted through.  Discuss cholesterol medication.       Relevant Orders   Lipid panel   Comprehensive metabolic panel   Migraines    Has a history of migraines.  Headache flares intermittently.  Overall  stable.       Numbness of toes - Primary    Reports the numbness in her toes as outlined.  Bilateral.  No motor weakness.  Some numbness in arms as outlined.  Has been evaluated previously - CTS.  Has splint to wear.  Some neck issues.  Will check labs, including B12, cbc, thyroid, vitamin D, etc.  Discussed further w/up and evaluation, including NCS.  Agreeable to referral to neurology.       Relevant Orders   CBC with Differential/Platelet (Completed)   TSH (Completed)   Vitamin B12 (Completed)   Basic metabolic panel (Completed)   Sedimentation rate (Completed)   Ambulatory referral to Neurology   Paroxysmal SVT (supraventricular tachycardia) ([place])    Saw cardiology previously.  Stable.  Continue propranolol.       Screening for breast cancer   Vitamin D deficiency   Relevant Orders   VITAMIN D 25 Hydroxy (Vit-D Deficiency, Fractures) (Completed)     Einar Pheasant, MD

## 2022-07-11 ENCOUNTER — Encounter: Payer: Self-pay | Admitting: Internal Medicine

## 2022-07-11 NOTE — Assessment & Plan Note (Signed)
Saw cardiology previously.  Stable.  Continue propranolol.  

## 2022-07-11 NOTE — Assessment & Plan Note (Signed)
Overall appears to be handling things relatively well.  On zoloft. Follow closely.

## 2022-07-11 NOTE — Assessment & Plan Note (Signed)
No upper symptoms reported.  On protonix.   

## 2022-07-11 NOTE — Assessment & Plan Note (Addendum)
The 10-year ASCVD risk score (Arnett DK, et al., 2019) is: 6.1%   Values used to calculate the score:     Age: 71 years     Sex: Female     Is Non-Hispanic African American: No     Diabetic: No     Tobacco smoker: No     Systolic Blood Pressure: 594 mmHg     Is BP treated: No     HDL Cholesterol: 72 mg/dL     Total Cholesterol: 198 mg/dL  Low cholesterol diet and exercise.  Follow lipid panel. Once acute issues sorted through.  Discuss cholesterol medication.

## 2022-07-11 NOTE — Assessment & Plan Note (Signed)
Reports the numbness in her toes as outlined.  Bilateral.  No motor weakness.  Some numbness in arms as outlined.  Has been evaluated previously - CTS.  Has splint to wear.  Some neck issues.  Will check labs, including B12, cbc, thyroid, vitamin D, etc.  Discussed further w/up and evaluation, including NCS.  Agreeable to referral to neurology.

## 2022-07-11 NOTE — Assessment & Plan Note (Signed)
Has a history of migraines.  Headache flares intermittently.  Overall stable.

## 2022-07-20 DIAGNOSIS — H35052 Retinal neovascularization, unspecified, left eye: Secondary | ICD-10-CM | POA: Diagnosis not present

## 2022-07-31 ENCOUNTER — Telehealth: Payer: Self-pay | Admitting: Internal Medicine

## 2022-07-31 DIAGNOSIS — N3000 Acute cystitis without hematuria: Secondary | ICD-10-CM | POA: Diagnosis not present

## 2022-07-31 NOTE — Telephone Encounter (Signed)
Pt called stating that she is currently at the beach and she has a UTI. Pt would like to know what to do

## 2022-08-06 ENCOUNTER — Ambulatory Visit: Payer: PPO | Admitting: Internal Medicine

## 2022-08-29 DIAGNOSIS — H35052 Retinal neovascularization, unspecified, left eye: Secondary | ICD-10-CM | POA: Diagnosis not present

## 2022-08-29 DIAGNOSIS — H35711 Central serous chorioretinopathy, right eye: Secondary | ICD-10-CM | POA: Diagnosis not present

## 2022-08-29 DIAGNOSIS — H2513 Age-related nuclear cataract, bilateral: Secondary | ICD-10-CM | POA: Diagnosis not present

## 2022-09-04 ENCOUNTER — Other Ambulatory Visit: Payer: Self-pay | Admitting: Internal Medicine

## 2022-09-05 NOTE — Telephone Encounter (Signed)
Rx ok'd for lorazepam #30 with no refills.

## 2022-09-10 ENCOUNTER — Other Ambulatory Visit: Payer: Self-pay | Admitting: Internal Medicine

## 2022-09-10 ENCOUNTER — Other Ambulatory Visit: Payer: Self-pay

## 2022-09-10 MED ORDER — TRAZODONE HCL 50 MG PO TABS: ORAL_TABLET | ORAL | 1 refills | Status: DC

## 2022-09-13 ENCOUNTER — Other Ambulatory Visit: Payer: Self-pay | Admitting: Internal Medicine

## 2022-09-26 DIAGNOSIS — G5603 Carpal tunnel syndrome, bilateral upper limbs: Secondary | ICD-10-CM | POA: Diagnosis not present

## 2022-09-26 DIAGNOSIS — Z8669 Personal history of other diseases of the nervous system and sense organs: Secondary | ICD-10-CM | POA: Diagnosis not present

## 2022-09-26 DIAGNOSIS — F411 Generalized anxiety disorder: Secondary | ICD-10-CM | POA: Diagnosis not present

## 2022-09-26 DIAGNOSIS — Z853 Personal history of malignant neoplasm of breast: Secondary | ICD-10-CM | POA: Diagnosis not present

## 2022-09-26 DIAGNOSIS — R200 Anesthesia of skin: Secondary | ICD-10-CM | POA: Diagnosis not present

## 2022-09-26 DIAGNOSIS — M7501 Adhesive capsulitis of right shoulder: Secondary | ICD-10-CM | POA: Diagnosis not present

## 2022-09-26 DIAGNOSIS — H35059 Retinal neovascularization, unspecified, unspecified eye: Secondary | ICD-10-CM | POA: Insufficient documentation

## 2022-10-04 ENCOUNTER — Other Ambulatory Visit: Payer: Self-pay | Admitting: Internal Medicine

## 2022-10-04 ENCOUNTER — Other Ambulatory Visit (INDEPENDENT_AMBULATORY_CARE_PROVIDER_SITE_OTHER): Payer: PPO

## 2022-10-04 DIAGNOSIS — H35052 Retinal neovascularization, unspecified, left eye: Secondary | ICD-10-CM | POA: Diagnosis not present

## 2022-10-04 DIAGNOSIS — E7800 Pure hypercholesterolemia, unspecified: Secondary | ICD-10-CM | POA: Diagnosis not present

## 2022-10-04 LAB — COMPREHENSIVE METABOLIC PANEL
ALT: 14 U/L (ref 0–35)
AST: 16 U/L (ref 0–37)
Albumin: 4.2 g/dL (ref 3.5–5.2)
Alkaline Phosphatase: 73 U/L (ref 39–117)
BUN: 18 mg/dL (ref 6–23)
CO2: 28 mEq/L (ref 19–32)
Calcium: 9.1 mg/dL (ref 8.4–10.5)
Chloride: 104 mEq/L (ref 96–112)
Creatinine, Ser: 0.89 mg/dL (ref 0.40–1.20)
GFR: 65.7 mL/min (ref >60.00)
Glucose, Bld: 102 mg/dL — ABNORMAL HIGH (ref 70–99)
Potassium: 3.9 mEq/L (ref 3.5–5.1)
Sodium: 141 mEq/L (ref 135–145)
Total Bilirubin: 0.4 mg/dL (ref 0.2–1.2)
Total Protein: 6.6 g/dL (ref 6.0–8.3)

## 2022-10-04 LAB — LIPID PANEL
Cholesterol: 180 mg/dL (ref 0–200)
HDL: 66.3 mg/dL (ref >39.00)
LDL Cholesterol: 96 mg/dL (ref 0–99)
NonHDL: 113.96
Total CHOL/HDL Ratio: 3
Triglycerides: 88 mg/dL (ref 0.0–149.0)
VLDL: 17.6 mg/dL (ref 0.0–40.0)

## 2022-10-10 ENCOUNTER — Ambulatory Visit (INDEPENDENT_AMBULATORY_CARE_PROVIDER_SITE_OTHER): Payer: PPO | Admitting: Internal Medicine

## 2022-10-10 ENCOUNTER — Encounter: Payer: Self-pay | Admitting: Internal Medicine

## 2022-10-10 VITALS — BP 110/70 | HR 64 | Temp 97.7°F | Resp 17 | Ht 68.0 in | Wt 141.0 lb

## 2022-10-10 DIAGNOSIS — M7501 Adhesive capsulitis of right shoulder: Secondary | ICD-10-CM | POA: Diagnosis not present

## 2022-10-10 DIAGNOSIS — F419 Anxiety disorder, unspecified: Secondary | ICD-10-CM

## 2022-10-10 DIAGNOSIS — H35719 Central serous chorioretinopathy, unspecified eye: Secondary | ICD-10-CM

## 2022-10-10 DIAGNOSIS — R200 Anesthesia of skin: Secondary | ICD-10-CM | POA: Diagnosis not present

## 2022-10-10 DIAGNOSIS — E7800 Pure hypercholesterolemia, unspecified: Secondary | ICD-10-CM

## 2022-10-10 DIAGNOSIS — Z853 Personal history of malignant neoplasm of breast: Secondary | ICD-10-CM

## 2022-10-10 DIAGNOSIS — I471 Supraventricular tachycardia, unspecified: Secondary | ICD-10-CM | POA: Diagnosis not present

## 2022-10-10 DIAGNOSIS — K219 Gastro-esophageal reflux disease without esophagitis: Secondary | ICD-10-CM

## 2022-10-10 DIAGNOSIS — J4520 Mild intermittent asthma, uncomplicated: Secondary | ICD-10-CM

## 2022-10-10 DIAGNOSIS — Z23 Encounter for immunization: Secondary | ICD-10-CM

## 2022-10-10 DIAGNOSIS — R053 Chronic cough: Secondary | ICD-10-CM | POA: Diagnosis not present

## 2022-10-10 NOTE — Progress Notes (Signed)
[name], female   DOB: [birth year], 71 y.o.   MRN: [id number]   Subjective:    [name], female    DOB: 06-Dec-1951, 71 y.o.   MRN: [id number]   Patient here for  Chief Complaint  Patient presents with   Follow-up    3 mth f/u   .   HPI Here to follow up regarding increased stress, headaches and hypercholesterolemia.  Recently evaluated by Dr Manuella Ghazi 09/26/22 - numbness of toes and forefoot.  Ordered NCS.  Labs obtained.   Was found to have elevated B6.  Stopped supplements.  Previous intermittent cough 07/2022.  Mucinex.  Is better.  Still persistent.  Discussed cxr. No acid reflux.  On protonix '20mg'$  q day.  No chest pain or sob reported.  No abdominal pain reported.  Right shoulder pain - some limited rom - previous PT.  Notify if desires further intervention.     Past Medical History:  Diagnosis Date   Basal cell carcinoma    multiple removed/[facility]   Breast cancer ([facility]) 12/13   s/p right lumpectomy, s/p chemo and XRT   DES exposure in utero    Hepatitis A    History of chicken pox    Hyperactive airway disease    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HCC)    Migraines    Miscarriage    x3   Past Surgical History:  Procedure Laterality Date   ABDOMINAL HYSTERECTOMY     BASAL CELL CARCINOMA EXCISION     Multiple   BREAST BIOPSY  1975   Cyst   BREAST LUMPECTOMY WITH AXILLARY LYMPH NODE DISSECTION  01/06/13, 01/22/13   Dr. Drue Dun, [place], second surgery for pos margin   CERVICAL FUSION  2001   COLONOSCOPY     COLONOSCOPY WITH PROPOFOL N/A 06/11/2022   Procedure: COLONOSCOPY WITH PROPOFOL;  Surgeon: Lin Landsman, MD;  Location: [facility];  Service: Gastroenterology;  Laterality: N/A;   DILATION AND CURETTAGE OF UTERUS     x 3   LAPAROSCOPIC BILATERAL SALPINGO OOPHERECTOMY Bilateral 12/21/2020   Procedure: LAPAROSCOPIC BILATERAL SALPINGO OOPHORECTOMY;  Surgeon: Ward, Honor Loh, MD;  Location: [facility];  Service: Gynecology;   Laterality: Bilateral;   VAGINAL DELIVERY     x4   WISDOM TOOTH EXTRACTION     Family History  Problem Relation Age of Onset   Pancreatic cancer Mother    Cancer Mother        Pancreatic   Arthritis Mother    Hyperlipidemia Mother    Hypertension Mother    Heart attack Father    Hyperlipidemia Father    Hypertension Father    Heart disease Father 52   Colon cancer Other    Cancer Other 71       Breast and Ovarian- dx 50's/Colon   Birth defects Maternal Aunt        Breast - in 49's   Stroke Maternal Grandmother    Birth defects Maternal Aunt        Breast - in 4's   Social History   Socioeconomic History   Marital status: Married    Spouse name: ED   Number of children: 4   Years of education: Not on file   Highest education level: Not on file  Occupational History   Occupation: Corporate treasurer - [employer]  Tobacco Use   Smoking status: Never   Smokeless tobacco: Never  Substance and Sexual Activity   Alcohol use: Yes  Alcohol/week: 0.0 standard drinks of alcohol    Comment: 3 times a week   Drug use: No   Sexual activity: Not on file  Other Topics Concern   Not on file  Social History Narrative   Lives in [place] with husband.  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    Social Determinants of Health   Financial Resource Strain: Low Risk  (05/25/2022)   Overall Financial Resource Strain (CARDIA)    Difficulty of Paying Living Expenses: Not hard at all  Food Insecurity: No Food Insecurity (05/25/2022)   Hunger Vital Sign    Worried About Running Out of Food in the Last Year: Never true    Ran Out of Food in the Last Year: Never true  Transportation Needs: No Transportation Needs (05/25/2022)   PRAPARE - Hydrologist (Medical): No    Lack of Transportation (Non-Medical): No  Physical Activity: Sufficiently Active (05/25/2022)   Exercise Vital Sign    Days of Exercise per Week: 5 days    Minutes of Exercise per Session: 40 min  Stress: No  Stress Concern Present (05/25/2022)   [place]    Feeling of Stress : Only a little  Social Connections: Unknown (05/25/2022)   Social Connection and Isolation Panel [NHANES]    Frequency of Communication with Friends and Family: More than three times a week    Frequency of Social Gatherings with Friends and Family: More than three times a week    Attends Religious Services: Not on Advertising copywriter or Organizations: Not on file    Attends Archivist Meetings: Not on file    Marital Status: Married     Review of Systems  Constitutional:  Negative for appetite change and unexpected weight change.  HENT:  Negative for congestion and sinus pressure.   Respiratory:  Positive for cough. Negative for chest tightness and shortness of breath.   Cardiovascular:  Negative for chest pain, palpitations and leg swelling.  Gastrointestinal:  Negative for abdominal pain, diarrhea, nausea and vomiting.  Genitourinary:  Negative for difficulty urinating and dysuria.  Musculoskeletal:  Negative for joint swelling and myalgias.  Skin:  Negative for color change and rash.  Neurological:  Negative for dizziness, light-headedness and headaches.  Psychiatric/Behavioral:  Negative for agitation and dysphoric mood.        Objective:     BP 110/70   Pulse 64   Temp 97.7 F (36.5 C) (Oral)   Resp 17   Ht '5\' 8"'$  (1.727 m)   Wt 141 lb (64 kg)   SpO2 98%   BMI 21.44 kg/m  Wt Readings from Last 3 Encounters:  10/10/22 141 lb (64 kg)  07/10/22 141 lb 3.2 oz (64 kg)  06/11/22 141 lb 15.6 oz (64.4 kg)    Physical Exam Vitals reviewed.  Constitutional:      General: She is not in acute distress.    Appearance: Normal appearance.  HENT:     Head: Normocephalic and atraumatic.     Right Ear: External ear normal.     Left Ear: External ear normal.  Eyes:     General: No scleral icterus.       Right eye: No  discharge.        Left eye: No discharge.     Conjunctiva/sclera: Conjunctivae normal.  Neck:     Thyroid: No thyromegaly.  Cardiovascular:     Rate  and Rhythm: Normal rate and regular rhythm.  Pulmonary:     Effort: No respiratory distress.     Breath sounds: Normal breath sounds. No wheezing.  Abdominal:     General: Bowel sounds are normal.     Palpations: Abdomen is soft.     Tenderness: There is no abdominal tenderness.  Musculoskeletal:        General: No swelling or tenderness.     Cervical back: Neck supple. No tenderness.  Lymphadenopathy:     Cervical: No cervical adenopathy.  Skin:    Findings: No erythema or rash.  Neurological:     Mental Status: She is alert.  Psychiatric:        Mood and Affect: Mood normal.        Behavior: Behavior normal.      Outpatient Encounter Medications as of 10/10/2022  Medication Sig   albuterol (VENTOLIN HFA) 108 (90 Base) MCG/ACT inhaler Inhale 2 puffs into the lungs every 6 (six) hours as needed for wheezing or shortness of breath.   BIOTIN PO Take 10,000 mg by mouth daily.    butalbital-aspirin-caffeine (FIORINAL) 50-325-40 MG capsule Take 1 capsule by mouth 2 (two) times daily as needed for headache.   Cholecalciferol (VITAMIN D3) 125 MCG (5000 UT) CAPS Take 5,000 Units by mouth daily.   EVENING PRIMROSE OIL PO Take 1,300 mg by mouth.   Glucosamine-Chondroitin (MOVE FREE PO) Take 1 capsule by mouth daily. Cartilage/boron/hyalauronic acid   hydrOXYzine (ATARAX) 10 MG tablet TAKE 1 TABLET BY MOUTH EVERY DAY AS NEEDED FOR ITCHING   ipratropium (ATROVENT) 0.06 % nasal spray SPRAY TWICE INTO EACH NOSTRIL TWICE DAILY   LORazepam (ATIVAN) 0.5 MG tablet TAKE 1 TABLET BY MOUTH DAILY AS NEEDED FOR ANXIETY   MAGNESIUM BISGLYCINATE PO Take 200 mg by mouth daily.   Multiple Minerals-Vitamins (CALCIUM CITRATE PLUS/MAGNESIUM PO) Take 2 tablets by mouth 2 (two) times daily. CAL CITRATE 500-MAGNESIUM 80-ZINC-10   Multiple Vitamins-Minerals  (MULTIVITAMIN WOMENS 50+ ADV PO) Take 1 tablet by mouth daily.   Omega-3 Fatty Acids (OMEGA-3 FISH OIL PO) Take 1,280 mg by mouth 2 (two) times daily.   pantoprazole (PROTONIX) 20 MG tablet TAKE ONE TABLET BY MOUTH TWICE DAILY BEFORE A MEAL   propranolol (INDERAL) 40 MG tablet TAKE 1 TABLET BY MOUTH DAILY   Riboflavin (VITAMIN B2 PO) Take 250 mg by mouth daily.   sertraline (ZOLOFT) 50 MG tablet TAKE 1 TABLET BY MOUTH DAILY   traZODone (DESYREL) 50 MG tablet TAKE 1 TABLET BY MOUTH NIGHTLY AS NEEDEDFOR SLEEP   [DISCONTINUED] traZODone (DESYREL) 50 MG tablet TAKE ONE TABLET AT BEDTIME AS NEEDED FORSLEEP   No facility-administered encounter medications on file as of 10/10/2022.     Lab Results  Component Value Date   WBC 5.9 07/10/2022   HGB 13.4 07/10/2022   HCT 39.3 07/10/2022   PLT 250.0 07/10/2022   GLUCOSE 102 (H) 10/04/2022   CHOL 180 10/04/2022   TRIG 88.0 10/04/2022   HDL 66.30 10/04/2022   LDLCALC 96 10/04/2022   ALT 14 10/04/2022   AST 16 10/04/2022   NA 141 10/04/2022   K 3.9 10/04/2022   CL 104 10/04/2022   CREATININE 0.89 10/04/2022   BUN 18 10/04/2022   CO2 28 10/04/2022   TSH 2.53 07/10/2022   MICROALBUR <0.7 11/14/2015       Assessment & Plan:   Problem List Items Addressed This Visit     Adhesive capsulitis of right shoulder    Has  been to PT.  Has seen ortho.  Notify if desires any further intervention.       Anxiety    Overall appears to be handling things relatively well.  On zoloft. Follow closely.       Asthma    Breathing stable.       Central serous retinopathy    Being followed by ophthalmology.        GERD (gastroesophageal reflux disease)    No upper symptoms reported. On protonix.       History of breast cancer    Mammogram 01/29/22 - Birads I.       Hypercholesterolemia    The 10-year ASCVD risk score (Arnett DK, et al., 2019) is: 5.6%   Values used to calculate the score:     Age: 56 years     Sex: Female     Is  Non-Hispanic African American: No     Diabetic: No     Tobacco smoker: No     Systolic Blood Pressure: 540 mmHg     Is BP treated: No     HDL Cholesterol: 66.3 mg/dL     Total Cholesterol: 180 mg/dL  Low cholesterol diet and exercise. Follow lipid panel.       Relevant Orders   Basic metabolic panel   Hepatic function panel   Lipid panel   Numbness of toes    Saw neurology as outlined.  NCS ordered.  Off supplements.        Paroxysmal SVT (supraventricular tachycardia)    Saw cardiology previously.  Stable.  Continue propranolol.       Persistent cough - Primary    Taking mucinex.  Check cxr.  Further w/up pending results.  Acid reflux controlled.        Relevant Orders   DG Chest 2 View   Other Visit Diagnoses     Need for immunization against influenza       Relevant Orders   Flu Vaccine QUAD High Dose(Fluad) (Completed)        Einar Pheasant, MD

## 2022-10-10 NOTE — Assessment & Plan Note (Signed)
The 10-year ASCVD risk score (Arnett DK, et al., 2019) is: 5.6%   Values used to calculate the score:     Age: 71 years     Sex: Female     Is Non-Hispanic African American: No     Diabetic: No     Tobacco smoker: No     Systolic Blood Pressure: 182 mmHg     Is BP treated: No     HDL Cholesterol: 66.3 mg/dL     Total Cholesterol: 180 mg/dL  Low cholesterol diet and exercise. Follow lipid panel.

## 2022-10-12 ENCOUNTER — Other Ambulatory Visit: Payer: PPO

## 2022-10-14 ENCOUNTER — Encounter: Payer: Self-pay | Admitting: Internal Medicine

## 2022-10-14 NOTE — Assessment & Plan Note (Signed)
Saw cardiology previously.  Stable.  Continue propranolol.  

## 2022-10-14 NOTE — Assessment & Plan Note (Signed)
Saw neurology as outlined.  NCS ordered.  Off supplements.

## 2022-10-14 NOTE — Assessment & Plan Note (Signed)
Has been to PT.  Has seen ortho.  Notify if desires any further intervention.

## 2022-10-14 NOTE — Assessment & Plan Note (Signed)
Breathing stable.

## 2022-10-14 NOTE — Assessment & Plan Note (Signed)
Being followed by ophthalmology.   

## 2022-10-14 NOTE — Assessment & Plan Note (Signed)
No upper symptoms reported.  On protonix.   

## 2022-10-14 NOTE — Assessment & Plan Note (Signed)
Mammogram 01/29/22 - Birads I.

## 2022-10-14 NOTE — Assessment & Plan Note (Signed)
Overall appears to be handling things relatively well.  On zoloft. Follow closely.

## 2022-10-14 NOTE — Assessment & Plan Note (Signed)
Taking mucinex.  Check cxr.  Further w/up pending results.  Acid reflux controlled.

## 2022-10-15 DIAGNOSIS — M25511 Pain in right shoulder: Secondary | ICD-10-CM | POA: Diagnosis not present

## 2022-10-16 ENCOUNTER — Other Ambulatory Visit: Payer: PPO

## 2022-10-16 ENCOUNTER — Ambulatory Visit (INDEPENDENT_AMBULATORY_CARE_PROVIDER_SITE_OTHER): Payer: PPO

## 2022-10-16 DIAGNOSIS — R053 Chronic cough: Secondary | ICD-10-CM | POA: Diagnosis not present

## 2022-10-16 DIAGNOSIS — R059 Cough, unspecified: Secondary | ICD-10-CM | POA: Diagnosis not present

## 2022-10-19 ENCOUNTER — Ambulatory Visit: Payer: PPO | Admitting: Cardiology

## 2022-10-19 DIAGNOSIS — H353120 Nonexudative age-related macular degeneration, left eye, stage unspecified: Secondary | ICD-10-CM | POA: Diagnosis not present

## 2022-10-23 DIAGNOSIS — M25511 Pain in right shoulder: Secondary | ICD-10-CM | POA: Diagnosis not present

## 2022-10-24 DIAGNOSIS — M19011 Primary osteoarthritis, right shoulder: Secondary | ICD-10-CM | POA: Diagnosis not present

## 2022-10-30 ENCOUNTER — Other Ambulatory Visit: Payer: Self-pay | Admitting: Internal Medicine

## 2022-10-30 DIAGNOSIS — R9389 Abnormal findings on diagnostic imaging of other specified body structures: Secondary | ICD-10-CM

## 2022-10-30 NOTE — Progress Notes (Signed)
Order placed for HRCT

## 2022-11-03 ENCOUNTER — Encounter: Payer: Self-pay | Admitting: Internal Medicine

## 2022-11-03 DIAGNOSIS — M19011 Primary osteoarthritis, right shoulder: Secondary | ICD-10-CM | POA: Insufficient documentation

## 2022-11-06 ENCOUNTER — Ambulatory Visit
Admission: RE | Admit: 2022-11-06 | Discharge: 2022-11-06 | Disposition: A | Discharge status: Home / Self Care | Payer: PPO | Source: Ambulatory Visit | Attending: Internal Medicine | Admitting: Internal Medicine

## 2022-11-06 DIAGNOSIS — I700 Atherosclerosis of aorta: Secondary | ICD-10-CM | POA: Diagnosis not present

## 2022-11-06 DIAGNOSIS — J984 Other disorders of lung: Secondary | ICD-10-CM | POA: Diagnosis not present

## 2022-11-06 DIAGNOSIS — R9389 Abnormal findings on diagnostic imaging of other specified body structures: Secondary | ICD-10-CM | POA: Diagnosis not present

## 2022-11-06 DIAGNOSIS — Z853 Personal history of malignant neoplasm of breast: Secondary | ICD-10-CM | POA: Diagnosis not present

## 2022-11-12 DIAGNOSIS — D2271 Melanocytic nevi of right lower limb, including hip: Secondary | ICD-10-CM | POA: Diagnosis not present

## 2022-11-12 DIAGNOSIS — L819 Disorder of pigmentation, unspecified: Secondary | ICD-10-CM | POA: Diagnosis not present

## 2022-11-12 DIAGNOSIS — Z85820 Personal history of malignant melanoma of skin: Secondary | ICD-10-CM | POA: Diagnosis not present

## 2022-11-12 DIAGNOSIS — Z86006 Personal history of melanoma in-situ: Secondary | ICD-10-CM | POA: Diagnosis not present

## 2022-11-12 DIAGNOSIS — L309 Dermatitis, unspecified: Secondary | ICD-10-CM | POA: Diagnosis not present

## 2022-11-12 DIAGNOSIS — L570 Actinic keratosis: Secondary | ICD-10-CM | POA: Diagnosis not present

## 2022-11-12 DIAGNOSIS — Z85828 Personal history of other malignant neoplasm of skin: Secondary | ICD-10-CM | POA: Diagnosis not present

## 2022-11-12 DIAGNOSIS — D225 Melanocytic nevi of trunk: Secondary | ICD-10-CM | POA: Diagnosis not present

## 2022-11-30 ENCOUNTER — Encounter: Payer: Self-pay | Admitting: Cardiology

## 2022-11-30 ENCOUNTER — Ambulatory Visit: Payer: PPO | Attending: Cardiology | Admitting: Cardiology

## 2022-11-30 VITALS — BP 94/58 | HR 58 | Wt 142.2 lb

## 2022-11-30 DIAGNOSIS — I471 Supraventricular tachycardia, unspecified: Secondary | ICD-10-CM | POA: Diagnosis not present

## 2022-11-30 DIAGNOSIS — E7800 Pure hypercholesterolemia, unspecified: Secondary | ICD-10-CM | POA: Diagnosis not present

## 2022-11-30 NOTE — Progress Notes (Signed)
Cardiology Office Note:    Date:  11/30/2022   ID:  Cindy Arnold, DOB 04/24/1951, MRN [id number]  PCP:  Einar Pheasant, [place]  Cardiologist:  Kate Sable, MD  Advanced Practice Provider:  No care team member to display Electrophysiologist:  None       Referring MD: Einar Pheasant, MD   Chief Complaint  Patient presents with   Follow-up    12 month follow up, last seen 03-2021, had recent CT     History of Present Illness:    Cindy Arnold is a 71 y.o. 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    Previously seen with terms of palpitations, cardiac monitor showed occasional paroxysmal SVTs.  She takes propranolol for ophthalmic migraines, this has also helped with symptoms of palpitations.  She feels well, has no concerns at this time.  Prior notes She has a history of ophthalmic migraine for which she takes propranolol 40 mg daily.  Past Medical History:  Diagnosis Date   Basal cell carcinoma    multiple removed/[facility]   Breast cancer ([facility]) 12/13   s/p right lumpectomy, s/p chemo and XRT   DES exposure in utero    Hepatitis A    History of chicken pox    Hyperactive airway disease    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HCC)    Migraines    Miscarriage    x3    Past Surgical History:  Procedure Laterality Date   ABDOMINAL HYSTERECTOMY     BASAL CELL CARCINOMA EXCISION     Multiple   BREAST BIOPSY  1975   Cyst   BREAST LUMPECTOMY WITH AXILLARY LYMPH NODE DISSECTION  01/06/13, 01/22/13   Dr. Drue Dun, [place], second surgery for pos margin   CERVICAL FUSION  2001   COLONOSCOPY     COLONOSCOPY WITH PROPOFOL N/A 06/11/2022   Procedure: COLONOSCOPY WITH PROPOFOL;  Surgeon: Lin Landsman, MD;  Location: [facility];  Service: Gastroenterology;  Laterality: N/A;   DILATION AND CURETTAGE OF UTERUS     x 3   LAPAROSCOPIC  BILATERAL SALPINGO OOPHERECTOMY Bilateral 12/21/2020   Procedure: LAPAROSCOPIC BILATERAL SALPINGO OOPHORECTOMY;  Surgeon: Ward, Honor Loh, MD;  Location: [facility];  Service: Gynecology;  Laterality: Bilateral;   VAGINAL DELIVERY     x4   WISDOM TOOTH EXTRACTION      Current Medications: Current Meds  Medication Sig   albuterol (VENTOLIN HFA) 108 (90 Base) MCG/ACT inhaler Inhale 2 puffs into the lungs every 6 (six) hours as needed for wheezing or shortness of breath.   BIOTIN PO Take 10,000 mg by mouth daily.    Cholecalciferol (VITAMIN D3) 125 MCG (5000 UT) CAPS Take 5,000 Units by mouth daily.   EVENING PRIMROSE OIL PO Take 1,300 mg by mouth.   Glucosamine-Chondroitin (MOVE FREE PO) Take 1 capsule by mouth daily. Cartilage/boron/hyalauronic acid   ipratropium (ATROVENT) 0.06 % nasal spray SPRAY TWICE INTO EACH NOSTRIL TWICE DAILY   LORazepam (ATIVAN) 0.5 MG tablet TAKE 1 TABLET BY MOUTH DAILY AS NEEDED FOR ANXIETY   MAGNESIUM BISGLYCINATE PO Take 200 mg by mouth daily.   mometasone (ELOCON) 0.1 % ointment Apply 0.01 % topically 2 (two) times daily.   Multiple Minerals-Vitamins (CALCIUM CITRATE PLUS/MAGNESIUM PO) Take 2 tablets by mouth 2 (two) times daily. CAL CITRATE 500-MAGNESIUM 80-ZINC-10   Multiple Vitamins-Minerals (MULTIVITAMIN WOMENS 50+ ADV PO) Take 1 tablet  by mouth daily.   Omega-3 Fatty Acids (OMEGA-3 FISH OIL PO) Take 1,280 mg by mouth 2 (two) times daily.   propranolol (INDERAL) 40 MG tablet TAKE 1 TABLET BY MOUTH DAILY   Riboflavin (VITAMIN B2 PO) Take 250 mg by mouth daily.   sertraline (ZOLOFT) 50 MG tablet TAKE 1 TABLET BY MOUTH DAILY   traZODone (DESYREL) 50 MG tablet TAKE 1 TABLET BY MOUTH NIGHTLY AS NEEDEDFOR SLEEP     Allergies:   Prednisone   Social History   Socioeconomic History   Marital status: Married    Spouse name: ED   Number of children: 4   Years of education: Not on file   Highest education level: Not on file  Occupational History    Occupation: Corporate treasurer - [employer]  Tobacco Use   Smoking status: Never   Smokeless tobacco: Never  Substance and Sexual Activity   Alcohol use: Yes    Alcohol/week: 0.0 standard drinks of alcohol    Comment: 3 times a week   Drug use: No   Sexual activity: Not on file  Other Topics Concern   Not on file  Social History Narrative   Lives in [place] with husband.  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    Social Determinants of Health   Financial Resource Strain: Low Risk  (05/25/2022)   Overall Financial Resource Strain (CARDIA)    Difficulty of Paying Living Expenses: Not hard at all  Food Insecurity: No Food Insecurity (05/25/2022)   Hunger Vital Sign    Worried About Running Out of Food in the Last Year: Never true    Ran Out of Food in the Last Year: Never true  Transportation Needs: No Transportation Needs (05/25/2022)   PRAPARE - Hydrologist (Medical): No    Lack of Transportation (Non-Medical): No  Physical Activity: Sufficiently Active (05/25/2022)   Exercise Vital Sign    Days of Exercise per Week: 5 days    Minutes of Exercise per Session: 40 min  Stress: No Stress Concern Present (05/25/2022)   [place]    Feeling of Stress : Only a little  Social Connections: Unknown (05/25/2022)   Social Connection and Isolation Panel [NHANES]    Frequency of Communication with Friends and Family: More than three times a week    Frequency of Social Gatherings with Friends and Family: More than three times a week    Attends Religious Services: Not on Advertising copywriter or Organizations: Not on file    Attends Archivist Meetings: Not on file    Marital Status: Married     Family History: The patient's family history includes Arthritis in her mother; Birth defects in her maternal aunt and maternal aunt; Cancer in her mother; Cancer (age of onset: 46) in an other family  member; Colon cancer in an other family member; Heart attack in her father; Heart disease (age of onset: 94) in her father; Hyperlipidemia in her father and mother; Hypertension in her father and mother; Pancreatic cancer in her mother; Stroke in her maternal grandmother.  ROS:   Please see the history of present illness.     All other systems reviewed and are negative.  EKGs/Labs/Other Studies Reviewed:    The following studies were reviewed today:   EKG:  EKG is ordered today.  EKG shows sinus bradycardia, heart rate 58.  Recent Labs: 07/10/2022: Hemoglobin 13.4; Platelets 250.0;  TSH 2.53 10/04/2022: ALT 14; BUN 18; Creatinine, Ser 0.89; Potassium 3.9; Sodium 141  Recent Lipid Panel    Component Value Date/Time   CHOL 180 10/04/2022 0733   TRIG 88.0 10/04/2022 0733   HDL 66.30 10/04/2022 0733   CHOLHDL 3 10/04/2022 0733   VLDL 17.6 10/04/2022 0733   LDLCALC 96 10/04/2022 0733     Risk Assessment/Calculations:      Physical Exam:    VS:  BP (!) 94/58 (BP Location: Left Arm, Patient Position: Sitting, Cuff Size: Normal)   Pulse (!) 58   Wt 142 lb 3.2 oz (64.5 kg)   SpO2 98%   BMI 21.62 kg/m     Wt Readings from Last 3 Encounters:  11/30/22 142 lb 3.2 oz (64.5 kg)  10/10/22 141 lb (64 kg)  07/10/22 141 lb 3.2 oz (64 kg)     GEN:  Well nourished, well developed in no acute distress HEENT: Normal NECK: No JVD; No carotid bruits CARDIAC: RRR, no murmurs, rubs, gallops RESPIRATORY:  Clear to auscultation without rales, wheezing or rhonchi  ABDOMEN: Soft, non-tender, non-distended MUSCULOSKELETAL:  No edema; No deformity  SKIN: Warm and dry NEUROLOGIC:  Alert and oriented x 3 PSYCHIATRIC:  Normal affect   ASSESSMENT:    1. Paroxysmal SVT (supraventricular tachycardia)   2. Pure hypercholesterolemia    PLAN:    In order of problems listed above:  occasional paroxysmal SVTs, no other significant arrhythmias noted on cardiac monitor.  Symptoms improved on  propranolol.  Continue propanolol 40 mg daily.   Hyperlipidemia, cholesterol levels controlled.  Continue low-cholesterol diet.  Follow-up as needed.   Medication Adjustments/Labs and Tests Ordered: Current medicines are reviewed at length with the patient today.  Concerns regarding medicines are outlined above.  Orders Placed This Encounter  Procedures   EKG 12-Lead   No orders of the defined types were placed in this encounter.   Patient Instructions  Medication Instructions:   Your physician recommends that you continue on your current medications as directed. Please refer to the Current Medication list given to you today.  *If you need a refill on your cardiac medications before your next appointment, please call your pharmacy*   Lab Work:  None Ordered  If you have labs (blood work) drawn today and your tests are completely normal, you will receive your results only by: [place] (if you have MyChart) OR A paper copy in the mail If you have any lab test that is abnormal or we need to change your treatment, we will call you to review the results.   Testing/Procedures:  None Ordered   Follow-Up: At [facility], you and your health needs are our priority.  As part of our continuing mission to provide you with exceptional heart care, we have created designated Provider Care Teams.  These Care Teams include your primary Cardiologist (physician) and Advanced Practice Providers (APPs -  Physician Assistants and Nurse Practitioners) who all work together to provide you with the care you need, when you need it.  We recommend signing up for the patient portal called "MyChart".  Sign up information is provided on this After Visit Summary.  MyChart is used to connect with patients for Virtual Visits (Telemedicine).  Patients are able to view lab/test results, encounter notes, upcoming appointments, etc.  Non-urgent messages can be sent to your provider as well.   To  learn more about what you can do with MyChart, go to [email or web address].    Your next  appointment:  PRN      Signed, Kate Sable, MD  11/30/2022 4:39 PM    [place]

## 2022-11-30 NOTE — Patient Instructions (Signed)
Medication Instructions:   Your physician recommends that you continue on your current medications as directed. Please refer to the Current Medication list given to you today.  *If you need a refill on your cardiac medications before your next appointment, please call your pharmacy*   Lab Work:  None Ordered  If you have labs (blood work) drawn today and your tests are completely normal, you will receive your results only by: [place] (if you have MyChart) OR A paper copy in the mail If you have any lab test that is abnormal or we need to change your treatment, we will call you to review the results.   Testing/Procedures:  None Ordered   Follow-Up: At [facility], you and your health needs are our priority.  As part of our continuing mission to provide you with exceptional heart care, we have created designated Provider Care Teams.  These Care Teams include your primary Cardiologist (physician) and Advanced Practice Providers (APPs -  Physician Assistants and Nurse Practitioners) who all work together to provide you with the care you need, when you need it.  We recommend signing up for the patient portal called "MyChart".  Sign up information is provided on this After Visit Summary.  MyChart is used to connect with patients for Virtual Visits (Telemedicine).  Patients are able to view lab/test results, encounter notes, upcoming appointments, etc.  Non-urgent messages can be sent to your provider as well.   To learn more about what you can do with MyChart, go to [email or web address].    Your next appointment:  PRN

## 2022-12-22 ENCOUNTER — Other Ambulatory Visit: Payer: Self-pay | Admitting: Internal Medicine

## 2023-01-10 ENCOUNTER — Other Ambulatory Visit: Payer: Self-pay | Admitting: Internal Medicine

## 2023-01-23 ENCOUNTER — Other Ambulatory Visit: Payer: Self-pay | Admitting: Internal Medicine

## 2023-01-25 DIAGNOSIS — H3589 Other specified retinal disorders: Secondary | ICD-10-CM | POA: Diagnosis not present

## 2023-01-29 DIAGNOSIS — H40033 Anatomical narrow angle, bilateral: Secondary | ICD-10-CM | POA: Diagnosis not present

## 2023-01-30 DIAGNOSIS — Z1231 Encounter for screening mammogram for malignant neoplasm of breast: Secondary | ICD-10-CM | POA: Diagnosis not present

## 2023-01-30 DIAGNOSIS — Z9189 Other specified personal risk factors, not elsewhere classified: Secondary | ICD-10-CM | POA: Diagnosis not present

## 2023-01-30 DIAGNOSIS — M8580 Other specified disorders of bone density and structure, unspecified site: Secondary | ICD-10-CM | POA: Diagnosis not present

## 2023-01-30 DIAGNOSIS — R92333 Mammographic heterogeneous density, bilateral breasts: Secondary | ICD-10-CM | POA: Diagnosis not present

## 2023-01-30 DIAGNOSIS — Z9221 Personal history of antineoplastic chemotherapy: Secondary | ICD-10-CM | POA: Diagnosis not present

## 2023-01-30 DIAGNOSIS — Z853 Personal history of malignant neoplasm of breast: Secondary | ICD-10-CM | POA: Diagnosis not present

## 2023-02-05 DIAGNOSIS — R200 Anesthesia of skin: Secondary | ICD-10-CM | POA: Diagnosis not present

## 2023-02-05 DIAGNOSIS — R202 Paresthesia of skin: Secondary | ICD-10-CM | POA: Diagnosis not present

## 2023-02-06 ENCOUNTER — Other Ambulatory Visit (INDEPENDENT_AMBULATORY_CARE_PROVIDER_SITE_OTHER): Payer: PPO

## 2023-02-06 DIAGNOSIS — E7800 Pure hypercholesterolemia, unspecified: Secondary | ICD-10-CM | POA: Diagnosis not present

## 2023-02-06 LAB — BASIC METABOLIC PANEL
BUN: 17 mg/dL (ref 6–23)
CO2: 30 mEq/L (ref 19–32)
Calcium: 9.4 mg/dL (ref 8.4–10.5)
Chloride: 104 mEq/L (ref 96–112)
Creatinine, Ser: 0.75 mg/dL (ref 0.40–1.20)
GFR: 80.49 mL/min (ref >60.00)
Glucose, Bld: 93 mg/dL (ref 70–99)
Potassium: 4 mEq/L (ref 3.5–5.1)
Sodium: 140 mEq/L (ref 135–145)

## 2023-02-06 LAB — HEPATIC FUNCTION PANEL
ALT: 838 U/L — ABNORMAL HIGH (ref 0–35)
AST: 484 U/L — ABNORMAL HIGH (ref 0–37)
Albumin: 4.1 g/dL (ref 3.5–5.2)
Alkaline Phosphatase: 179 U/L — ABNORMAL HIGH (ref 39–117)
Bilirubin, Direct: 0.2 mg/dL (ref 0.0–0.3)
Total Bilirubin: 0.8 mg/dL (ref 0.2–1.2)
Total Protein: 6.5 g/dL (ref 6.0–8.3)

## 2023-02-06 LAB — LIPID PANEL
Cholesterol: 186 mg/dL (ref 0–200)
HDL: 83.7 mg/dL (ref >39.00)
LDL Cholesterol: 93 mg/dL (ref 0–99)
NonHDL: 102.17
Total CHOL/HDL Ratio: 2
Triglycerides: 45 mg/dL (ref 0.0–149.0)
VLDL: 9 mg/dL (ref 0.0–40.0)

## 2023-02-07 ENCOUNTER — Ambulatory Visit
Admission: RE | Admit: 2023-02-07 | Discharge: 2023-02-07 | Disposition: A | Discharge status: Home / Self Care | Payer: PPO | Source: Ambulatory Visit | Attending: Internal Medicine | Admitting: Internal Medicine

## 2023-02-07 ENCOUNTER — Other Ambulatory Visit: Payer: Self-pay

## 2023-02-07 DIAGNOSIS — R7989 Other specified abnormal findings of blood chemistry: Secondary | ICD-10-CM

## 2023-02-07 DIAGNOSIS — N8110 Cystocele, unspecified: Secondary | ICD-10-CM | POA: Diagnosis not present

## 2023-02-07 DIAGNOSIS — K7689 Other specified diseases of liver: Secondary | ICD-10-CM | POA: Diagnosis not present

## 2023-02-07 DIAGNOSIS — K828 Other specified diseases of gallbladder: Secondary | ICD-10-CM | POA: Diagnosis not present

## 2023-02-07 DIAGNOSIS — I700 Atherosclerosis of aorta: Secondary | ICD-10-CM | POA: Diagnosis not present

## 2023-02-11 ENCOUNTER — Other Ambulatory Visit (HOSPITAL_COMMUNITY)
Admission: RE | Admit: 2023-02-11 | Discharge: 2023-02-11 | Disposition: A | Discharge status: Home / Self Care | Payer: PPO | Source: Ambulatory Visit | Attending: Internal Medicine | Admitting: Internal Medicine

## 2023-02-11 ENCOUNTER — Ambulatory Visit (INDEPENDENT_AMBULATORY_CARE_PROVIDER_SITE_OTHER): Payer: PPO | Admitting: Internal Medicine

## 2023-02-11 ENCOUNTER — Encounter: Payer: Self-pay | Admitting: Internal Medicine

## 2023-02-11 VITALS — BP 114/74 | HR 85 | Temp 97.9°F | Resp 16 | Ht 68.5 in | Wt 138.0 lb

## 2023-02-11 DIAGNOSIS — F419 Anxiety disorder, unspecified: Secondary | ICD-10-CM | POA: Diagnosis not present

## 2023-02-11 DIAGNOSIS — Z1151 Encounter for screening for human papillomavirus (HPV): Secondary | ICD-10-CM | POA: Diagnosis not present

## 2023-02-11 DIAGNOSIS — E7800 Pure hypercholesterolemia, unspecified: Secondary | ICD-10-CM

## 2023-02-11 DIAGNOSIS — Z01419 Encounter for gynecological examination (general) (routine) without abnormal findings: Secondary | ICD-10-CM | POA: Insufficient documentation

## 2023-02-11 DIAGNOSIS — Z1231 Encounter for screening mammogram for malignant neoplasm of breast: Secondary | ICD-10-CM

## 2023-02-11 DIAGNOSIS — Z124 Encounter for screening for malignant neoplasm of cervix: Secondary | ICD-10-CM

## 2023-02-11 DIAGNOSIS — R7989 Other specified abnormal findings of blood chemistry: Secondary | ICD-10-CM

## 2023-02-11 DIAGNOSIS — I471 Supraventricular tachycardia, unspecified: Secondary | ICD-10-CM

## 2023-02-11 DIAGNOSIS — Z0000 Encounter for general adult medical examination without abnormal findings: Secondary | ICD-10-CM | POA: Diagnosis not present

## 2023-02-11 DIAGNOSIS — J4520 Mild intermittent asthma, uncomplicated: Secondary | ICD-10-CM

## 2023-02-11 DIAGNOSIS — K219 Gastro-esophageal reflux disease without esophagitis: Secondary | ICD-10-CM | POA: Diagnosis not present

## 2023-02-11 DIAGNOSIS — H35719 Central serous chorioretinopathy, unspecified eye: Secondary | ICD-10-CM

## 2023-02-11 DIAGNOSIS — Z853 Personal history of malignant neoplasm of breast: Secondary | ICD-10-CM

## 2023-02-11 DIAGNOSIS — E2839 Other primary ovarian failure: Secondary | ICD-10-CM | POA: Diagnosis not present

## 2023-02-11 LAB — HEPATIC FUNCTION PANEL
ALT: 644 U/L — ABNORMAL HIGH (ref 0–35)
AST: 366 U/L — ABNORMAL HIGH (ref 0–37)
Albumin: 4.2 g/dL (ref 3.5–5.2)
Alkaline Phosphatase: 166 U/L — ABNORMAL HIGH (ref 39–117)
Bilirubin, Direct: 0.2 mg/dL (ref 0.0–0.3)
Total Bilirubin: 0.7 mg/dL (ref 0.2–1.2)
Total Protein: 6.6 g/dL (ref 6.0–8.3)

## 2023-02-11 LAB — GAMMA GT: GGT: 279 U/L — ABNORMAL HIGH (ref 7–51)

## 2023-02-11 NOTE — Assessment & Plan Note (Addendum)
Physical today 02/11/23.  PAP 01/2018 - changes c/w atrophy.  Negative HPV.  PAP today 02/11/23. Mammogram 01/30/23 - birads II. Colonoscopy 06/11/22 - recommended f/u in 7-10 years. Marland Kitchen

## 2023-02-11 NOTE — Progress Notes (Signed)
Subjective:    [name], female    DOB: 1951-11-13, 72 y.o.   MRN: [id number]  Patient here for  Chief Complaint  Patient presents with   Annual Exam    HPI Here for her physical exam.  Reports she has been doing relatively well.  Recent labs revealed elevated LFTs (including alk phos).  Denies any nausea or vomiting.  No abdominal pain.  Has not been sick.  Taking supplements, including eye supplements.  No chest pain or sob reported.  No bowel issues.  On protonix. Controlling acid reflux.  On zoloft.  Takes propranolol for ocular migraines.  Saw cardiology in f/u 11/30/22 - f/u paroxysmal SVT.  Stable.  No further w/up warranted.  F/u prn. Had f/u with oncology - 01/30/23 - completed 5 years of aromasin 08/2018.  01/30/23 - mammogram benign.  Plans to start getting mammograms here locally now. Saw Dr Leafy Ro 02/07/23 - evaluation - pelvic organ prolapse.  Recommended continuing PFPT. Also recommended annual cervical and upper 1/3 vaginal pap - due to DES exposure.  Discussed with her.  Desires pap to be performed today.    Past Medical History:  Diagnosis Date   Basal cell carcinoma    multiple removed/[facility]   Breast cancer ([facility]) 12/13   s/p right lumpectomy, s/p chemo and XRT   DES exposure in utero    Hepatitis A    History of chicken pox    Hyperactive airway disease    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HCC)    Migraines    Miscarriage    x3   Past Surgical History:  Procedure Laterality Date   ABDOMINAL HYSTERECTOMY     BASAL CELL CARCINOMA EXCISION     Multiple   BREAST BIOPSY  1975   Cyst   BREAST LUMPECTOMY WITH AXILLARY LYMPH NODE DISSECTION  01/06/13, 01/22/13   Dr. Drue Dun, [place], second surgery for pos margin   CERVICAL FUSION  2001   COLONOSCOPY     COLONOSCOPY WITH PROPOFOL N/A 06/11/2022   Procedure: COLONOSCOPY WITH PROPOFOL;  Surgeon: Lin Landsman, MD;  Location: [facility];  Service: Gastroenterology;  Laterality: N/A;    DILATION AND CURETTAGE OF UTERUS     x 3   LAPAROSCOPIC BILATERAL SALPINGO OOPHERECTOMY Bilateral 12/21/2020   Procedure: LAPAROSCOPIC BILATERAL SALPINGO OOPHORECTOMY;  Surgeon: Ward, Honor Loh, MD;  Location: [facility];  Service: Gynecology;  Laterality: Bilateral;   VAGINAL DELIVERY     x4   WISDOM TOOTH EXTRACTION     Family History  Problem Relation Age of Onset   Pancreatic cancer Mother    Cancer Mother        Pancreatic   Arthritis Mother    Hyperlipidemia Mother    Hypertension Mother    Heart attack Father    Hyperlipidemia Father    Hypertension Father    Heart disease Father 64   Colon cancer Other    Cancer Other 38       Breast and Ovarian- dx 50's/Colon   Birth defects Maternal Aunt        Breast - in 68's   Stroke Maternal Grandmother    Birth defects Maternal Aunt        Breast - in 29's   Social History   Socioeconomic History   Marital status: Married    Spouse name: ED   Number of children: 4   Years of education: Not on file   Highest education level: Not on  file  Occupational History   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    Social Determinants of Health   Financial Resource Strain: Low Risk  (05/25/2022)   Overall Financial Resource Strain (CARDIA)    Difficulty of Paying Living Expenses: Not hard at all  Food Insecurity: No Food Insecurity (05/25/2022)   Hunger Vital Sign    Worried About Running Out of Food in the Last Year: Never true    Ran Out of Food in the Last Year: Never true  Transportation Needs: No Transportation Needs (05/25/2022)   PRAPARE - Radiographer, therapeutic (Medical): No    Lack of Transportation (Non-Medical): No  Physical Activity: Sufficiently Active (05/25/2022)   Exercise Vital Sign    Days of Exercise per Week: 5 days    Minutes of Exercise per Session: 40 min  Stress: No Stress Concern Present (05/25/2022)   [place]    Feeling of Stress : Only a little  Social Connections: Unknown (05/25/2022)   Social Connection and Isolation Panel [NHANES]    Frequency of Communication with Friends and Family: More than three times a week    Frequency of Social Gatherings with Friends and Family: More than three times a week    Attends Religious Services: Not on Advertising copywriter or Organizations: Not on file    Attends Archivist Meetings: Not on file    Marital Status: Married     Review of Systems  Constitutional:  Negative for appetite change and unexpected weight change.  HENT:  Negative for congestion, sinus pressure and sore throat.   Eyes:  Negative for pain and visual disturbance.  Respiratory:  Negative for cough, chest tightness and shortness of breath.   Cardiovascular:  Negative for chest pain and palpitations.  Gastrointestinal:  Negative for abdominal pain, diarrhea, nausea and vomiting.  Genitourinary:  Negative for difficulty urinating and dysuria.  Musculoskeletal:  Negative for joint swelling and myalgias.  Skin:  Negative for color change and rash.  Neurological:  Negative for dizziness and headaches.  Hematological:  Negative for adenopathy. Does not bruise/bleed easily.  Psychiatric/Behavioral:  Negative for agitation and dysphoric mood.        Objective:     BP 114/74   Pulse 85   Temp 97.9 F (36.6 C)   Resp 16   Ht 5' 8.5" (1.74 m)   Wt 138 lb (62.6 kg)   SpO2 98%   BMI 20.68 kg/m  Wt Readings from Last 3 Encounters:  02/11/23 138 lb (62.6 kg)  11/30/22 142 lb 3.2 oz (64.5 kg)  10/10/22 141 lb (64 kg)     Physical Exam Vitals reviewed.  Constitutional:      General: She is not in acute distress.    Appearance: Normal appearance. She is well-developed.  HENT:     Head: Normocephalic and atraumatic.     Right Ear: External ear normal.     Left Ear: External ear normal.  Eyes:     General: No scleral icterus.       Right eye: No discharge.        Left eye: No discharge.     Conjunctiva/sclera: Conjunctivae normal.  Neck:     Thyroid: No thyromegaly.  Cardiovascular:     Rate and Rhythm: Normal rate and regular rhythm.  Pulmonary:     Effort: No tachypnea, accessory muscle usage or respiratory distress.     Breath sounds: Normal breath sounds. No decreased breath sounds or wheezing.  Chest:  Breasts:    Right: No inverted nipple, mass, nipple discharge or tenderness (no axillary adenopathy).     Left: No inverted nipple, mass, nipple discharge or tenderness (no axilarry adenopathy).  Abdominal:     General: Bowel sounds are normal.     Palpations: Abdomen is soft.     Tenderness: There is no abdominal tenderness.  Genitourinary:    Comments: Normal external genitalia.  Vaginal vault without lesions.  Pap smear performed.  Could not appreciate any adnexal masses or tenderness.   Musculoskeletal:        General: No swelling or tenderness.     Cervical back: Neck supple.  Lymphadenopathy:     Cervical: No cervical adenopathy.  Skin:    Findings: No erythema or rash.  Neurological:     Mental Status: She is alert and oriented to person, place, and time.  Psychiatric:        Mood and Affect: Mood normal.        Behavior: Behavior normal.      Outpatient Encounter Medications as of 02/11/2023  Medication Sig   albuterol (VENTOLIN HFA) 108 (90 Base) MCG/ACT inhaler Inhale 2 puffs into the lungs every 6 (six) hours as needed for wheezing or shortness of breath.   BIOTIN PO Take 10,000 mg by mouth daily.    Cholecalciferol (VITAMIN D3) 125 MCG (5000 UT) CAPS Take 5,000 Units  by mouth daily.   EVENING PRIMROSE OIL PO Take 1,300 mg by mouth.   Glucosamine-Chondroitin (MOVE FREE PO) Take 1 capsule by mouth daily. Cartilage/boron/hyalauronic acid   ipratropium (ATROVENT) 0.06 % nasal spray SPRAY TWICE INTO EACH NOSTRIL TWICE DAILY   LORazepam (ATIVAN) 0.5 MG tablet TAKE 1 TABLET BY MOUTH DAILY AS NEEDED FOR ANXIETY   MAGNESIUM BISGLYCINATE PO Take 200 mg by mouth daily.   mometasone (ELOCON) 0.1 % ointment Apply 0.01 % topically 2 (two) times daily.   Multiple Minerals-Vitamins (CALCIUM CITRATE PLUS/MAGNESIUM PO) Take 2 tablets by mouth 2 (two) times daily. CAL CITRATE 500-MAGNESIUM 80-ZINC-10   Multiple Vitamins-Minerals (MULTIVITAMIN WOMENS 50+ ADV PO) Take 1 tablet by mouth daily.   Omega-3 Fatty Acids (OMEGA-3 FISH OIL PO) Take 1,280 mg by mouth 2 (two) times daily.   propranolol (INDERAL) 40 MG tablet TAKE 1 TABLET BY MOUTH DAILY   Riboflavin (VITAMIN B2 PO) Take 250 mg by mouth daily.   sertraline (ZOLOFT) 50 MG tablet TAKE 1 TABLET BY MOUTH DAILY   traZODone (DESYREL) 50 MG tablet TAKE 1 TABLET BY MOUTH NIGHTLY AS NEEDEDFOR SLEEP   No facility-administered encounter medications on file as of 02/11/2023.     Lab Results  Component Value Date   WBC 3.8 (L) 02/15/2023   HGB 13.5 02/15/2023   HCT 39.5 02/15/2023   PLT 289.0 02/15/2023   GLUCOSE 93 02/06/2023   CHOL 186 02/06/2023   TRIG 45.0 02/06/2023   HDL 83.70 02/06/2023   LDLCALC 93 02/06/2023   ALT 542 (H) 02/15/2023  AST 305 (H) 02/15/2023   NA 140 02/06/2023   K 4.0 02/06/2023   CL 104 02/06/2023   CREATININE 0.75 02/06/2023   BUN 17 02/06/2023   CO2 30 02/06/2023   TSH 2.53 07/10/2022   INR 0.9 02/15/2023   MICROALBUR <0.7 11/14/2015    US Abdomen Complete  Result Date: 02/07/2023 CLINICAL DATA:  Elevated liver function studies. EXAM: ABDOMEN ULTRASOUND COMPLETE COMPARISON:  Limited correlation made with chest CT 11/06/2022. FINDINGS: Gallbladder: No gallstones or wall thickening  visualized. No sonographic Murphy sign noted by sonographer. Common bile duct: Diameter: 5 mm. No intrahepatic biliary dilatation. Liver: There is a small septated cyst adjacent to the gallbladder which measures up to 1.3 cm in diameter. No suspicious liver lesions. Portal vein is patent on color Doppler imaging with normal direction of blood flow towards the liver. IVC: No abnormality visualized. Pancreas: Visualized portion unremarkable. Spleen: Size and appearance within normal limits. Right Kidney: Length: 10.4 cm. Echogenicity within normal limits. No mass or hydronephrosis visualized. Left Kidney: Length: 11.0 cm. Echogenicity within normal limits. No mass or hydronephrosis visualized. Abdominal aorta: Atherosclerosis without evidence of aneurysm. Other findings: None. IMPRESSION: 1. No acute findings or explanation for elevated liver function studies. 2. Small liver cyst. Electronically Signed   By: Richardean Sale M.D.   On: 02/07/2023 16:44       Assessment & Plan:  Routine general medical examination at a health care facility  Encounter for screening mammogram for malignant neoplasm of breast Assessment & Plan: Mammogram 01/30/23 - Birads II.    Healthcare maintenance Assessment & Plan: Physical today 02/11/23.  PAP 01/2018 - changes c/w atrophy.  Negative HPV.  PAP today 02/11/23. Mammogram 01/30/23 - birads II. Colonoscopy 06/11/22 - recommended f/u in 7-10 years. .    Hypercholesterolemia Assessment & Plan: The 10-year ASCVD risk score (Arnett DK, et al., 2019) is: 6.9%   Values used to calculate the score:     Age: 82 years     Sex: Female     Is Non-Hispanic African American: No     Diabetic: No     Tobacco smoker: No     Systolic Blood Pressure: 99991111 mmHg     Is BP treated: No     HDL Cholesterol: 83.7 mg/dL     Total Cholesterol: 186 mg/dL  Low cholesterol diet and exercise. Follow lipid panel.   Orders: -     Lipid panel; Future -     Hepatic function panel;  Future  Anxiety Assessment & Plan: Overall appears to be doing well.  On zoloft. Follow.   Orders: -     CBC with Differential/Platelet; Future -     Basic metabolic panel; Future -     TSH; Future  Elevated LFTs -     Hepatic function panel -     Gamma GT  Screening for cervical cancer -     Cytology - PAP  Mild intermittent asthma, unspecified whether complicated Assessment & Plan: Breathing stable.    Central serous chorioretinopathy, unspecified laterality Assessment & Plan: Being followed by ophthalmology.     Gastroesophageal reflux disease, unspecified whether esophagitis present Assessment & Plan: No upper symptoms reported. On protonix.    History of breast cancer Assessment & Plan: Mammogram 01/30/23 - Birads II.    Paroxysmal SVT (supraventricular tachycardia) Assessment & Plan: Saw cardiology previously.  Stable.  Continue propranolol.    Estrogen deficiency -     DG Bone Density; Future  Abnormal liver  function tests Assessment & Plan: Recent elevation in ALT, AST and alk phos.  Ultrasound - no acute findings.  Small liver cyst. Discussed any new medications/supplements.  No increased alcohol.  Will check cbc, pt/inr and hepatitis panel.  Discussed with GI.  Schedule f/u with GI for further evaluation.  Follow liver function tests.        Einar Pheasant, MD

## 2023-02-12 LAB — CYTOLOGY - PAP
Comment: NEGATIVE
Diagnosis: NEGATIVE
High risk HPV: NEGATIVE

## 2023-02-13 ENCOUNTER — Other Ambulatory Visit: Payer: Self-pay | Admitting: Internal Medicine

## 2023-02-13 ENCOUNTER — Telehealth: Payer: Self-pay | Admitting: Internal Medicine

## 2023-02-13 DIAGNOSIS — R7989 Other specified abnormal findings of blood chemistry: Secondary | ICD-10-CM

## 2023-02-13 NOTE — Telephone Encounter (Signed)
Pt returned call and stated she has some information to give to [facility]

## 2023-02-13 NOTE — Progress Notes (Signed)
Future labs ordered.  See result note.

## 2023-02-13 NOTE — Telephone Encounter (Signed)
Pt sched w/ Vanga 4/23 in [place] office

## 2023-02-15 ENCOUNTER — Other Ambulatory Visit (INDEPENDENT_AMBULATORY_CARE_PROVIDER_SITE_OTHER): Payer: PPO

## 2023-02-15 DIAGNOSIS — R7989 Other specified abnormal findings of blood chemistry: Secondary | ICD-10-CM | POA: Diagnosis not present

## 2023-02-15 LAB — PROTIME-INR
INR: 0.9 ratio (ref 0.8–1.0)
Prothrombin Time: 10.4 s (ref 9.6–13.1)

## 2023-02-15 LAB — CBC WITH DIFFERENTIAL/PLATELET
Basophils Absolute: 0.1 10*3/uL (ref 0.0–0.1)
Basophils Relative: 2.2 % (ref 0.0–3.0)
Eosinophils Absolute: 0.1 10*3/uL (ref 0.0–0.7)
Eosinophils Relative: 2.4 % (ref 0.0–5.0)
HCT: 39.5 % (ref 36.0–46.0)
Hemoglobin: 13.5 g/dL (ref 12.0–15.0)
Lymphocytes Relative: 30.4 % (ref 12.0–46.0)
Lymphs Abs: 1.2 10*3/uL (ref 0.7–4.0)
MCHC: 34.1 g/dL (ref 30.0–36.0)
MCV: 94.5 fl (ref 78.0–100.0)
Monocytes Absolute: 0.5 10*3/uL (ref 0.1–1.0)
Monocytes Relative: 11.8 % (ref 3.0–12.0)
Neutro Abs: 2 10*3/uL (ref 1.4–7.7)
Neutrophils Relative %: 53.2 % (ref 43.0–77.0)
Platelets: 289 10*3/uL (ref 150.0–400.0)
RBC: 4.18 Mil/uL (ref 3.87–5.11)
RDW: 13.8 % (ref 11.5–15.5)
WBC: 3.8 10*3/uL — ABNORMAL LOW (ref 4.0–10.5)

## 2023-02-15 LAB — HEPATIC FUNCTION PANEL
ALT: 542 U/L — ABNORMAL HIGH (ref 0–35)
AST: 305 U/L — ABNORMAL HIGH (ref 0–37)
Albumin: 4.2 g/dL (ref 3.5–5.2)
Alkaline Phosphatase: 160 U/L — ABNORMAL HIGH (ref 39–117)
Bilirubin, Direct: 0.2 mg/dL (ref 0.0–0.3)
Total Bilirubin: 0.6 mg/dL (ref 0.2–1.2)
Total Protein: 6.7 g/dL (ref 6.0–8.3)

## 2023-02-17 ENCOUNTER — Encounter: Payer: Self-pay | Admitting: Internal Medicine

## 2023-02-17 DIAGNOSIS — R7989 Other specified abnormal findings of blood chemistry: Secondary | ICD-10-CM | POA: Insufficient documentation

## 2023-02-17 NOTE — Assessment & Plan Note (Signed)
Saw cardiology previously.  Stable.  Continue propranolol.

## 2023-02-17 NOTE — Assessment & Plan Note (Signed)
Being followed by ophthalmology.   

## 2023-02-17 NOTE — Assessment & Plan Note (Signed)
No upper symptoms reported.  On protonix.   

## 2023-02-17 NOTE — Assessment & Plan Note (Addendum)
Overall appears to be doing well.  On zoloft. Follow.

## 2023-02-17 NOTE — Assessment & Plan Note (Signed)
Breathing stable.

## 2023-02-17 NOTE — Assessment & Plan Note (Signed)
Mammogram 01/30/23 - Birads II.

## 2023-02-17 NOTE — Assessment & Plan Note (Signed)
The 10-year ASCVD risk score (Arnett DK, et al., 2019) is: 6.9%   Values used to calculate the score:     Age: 72 years     Sex: Female     Is Non-Hispanic African American: No     Diabetic: No     Tobacco smoker: No     Systolic Blood Pressure: 99991111 mmHg     Is BP treated: No     HDL Cholesterol: 83.7 mg/dL     Total Cholesterol: 186 mg/dL  Low cholesterol diet and exercise. Follow lipid panel.

## 2023-02-17 NOTE — Assessment & Plan Note (Addendum)
Recent elevation in ALT, AST and alk phos.  Ultrasound - no acute findings.  Small liver cyst. Discussed any new medications/supplements.  No increased alcohol.  Will check cbc, pt/inr and hepatitis panel.  Discussed with GI.  Schedule f/u with GI for further evaluation.  Follow liver function tests.

## 2023-02-18 LAB — HEPATITIS A ANTIBODY, IGM: Hep A IgM: NONREACTIVE

## 2023-02-18 LAB — HEPATITIS B E ANTIGEN: Hep B E Ag: NONREACTIVE

## 2023-02-18 LAB — HEPATITIS B SURFACE ANTIGEN: Hepatitis B Surface Ag: NONREACTIVE

## 2023-02-18 LAB — HEPATITIS B CORE ANTIBODY, IGM: Hep B C IgM: NONREACTIVE

## 2023-02-18 LAB — HEPATITIS C ANTIBODY: Hepatitis C Ab: NONREACTIVE

## 2023-02-18 LAB — HEPATITIS B SURFACE ANTIBODY,QUALITATIVE: Hep B S Ab: NONREACTIVE

## 2023-02-19 ENCOUNTER — Other Ambulatory Visit: Payer: Self-pay

## 2023-02-19 DIAGNOSIS — R7989 Other specified abnormal findings of blood chemistry: Secondary | ICD-10-CM

## 2023-03-05 ENCOUNTER — Other Ambulatory Visit: Payer: Self-pay | Admitting: Internal Medicine

## 2023-03-12 ENCOUNTER — Other Ambulatory Visit (INDEPENDENT_AMBULATORY_CARE_PROVIDER_SITE_OTHER): Payer: PPO

## 2023-03-12 DIAGNOSIS — R7989 Other specified abnormal findings of blood chemistry: Secondary | ICD-10-CM | POA: Diagnosis not present

## 2023-03-12 LAB — HEPATIC FUNCTION PANEL
ALT: 316 U/L — ABNORMAL HIGH (ref 0–35)
AST: 201 U/L — ABNORMAL HIGH (ref 0–37)
Albumin: 3.9 g/dL (ref 3.5–5.2)
Alkaline Phosphatase: 135 U/L — ABNORMAL HIGH (ref 39–117)
Bilirubin, Direct: 0.1 mg/dL (ref 0.0–0.3)
Total Bilirubin: 0.4 mg/dL (ref 0.2–1.2)
Total Protein: 6.5 g/dL (ref 6.0–8.3)

## 2023-03-20 ENCOUNTER — Other Ambulatory Visit: Payer: Self-pay | Admitting: Internal Medicine

## 2023-03-20 DIAGNOSIS — D225 Melanocytic nevi of trunk: Secondary | ICD-10-CM | POA: Diagnosis not present

## 2023-03-20 DIAGNOSIS — X32XXXA Exposure to sunlight, initial encounter: Secondary | ICD-10-CM | POA: Diagnosis not present

## 2023-03-20 DIAGNOSIS — D2272 Melanocytic nevi of left lower limb, including hip: Secondary | ICD-10-CM | POA: Diagnosis not present

## 2023-03-20 DIAGNOSIS — Z85820 Personal history of malignant melanoma of skin: Secondary | ICD-10-CM | POA: Diagnosis not present

## 2023-03-20 DIAGNOSIS — H61032 Chondritis of left external ear: Secondary | ICD-10-CM | POA: Diagnosis not present

## 2023-03-20 DIAGNOSIS — Z85828 Personal history of other malignant neoplasm of skin: Secondary | ICD-10-CM | POA: Diagnosis not present

## 2023-03-20 DIAGNOSIS — L570 Actinic keratosis: Secondary | ICD-10-CM | POA: Diagnosis not present

## 2023-03-25 DIAGNOSIS — H35052 Retinal neovascularization, unspecified, left eye: Secondary | ICD-10-CM | POA: Diagnosis not present

## 2023-03-26 ENCOUNTER — Other Ambulatory Visit: Payer: Self-pay | Admitting: Internal Medicine

## 2023-04-05 ENCOUNTER — Other Ambulatory Visit: Payer: Self-pay | Admitting: Internal Medicine

## 2023-04-16 ENCOUNTER — Encounter: Payer: Self-pay | Admitting: Gastroenterology

## 2023-04-16 ENCOUNTER — Ambulatory Visit (INDEPENDENT_AMBULATORY_CARE_PROVIDER_SITE_OTHER): Payer: PPO | Admitting: Gastroenterology

## 2023-04-16 VITALS — BP 103/66 | HR 66 | Temp 97.5°F | Ht 68.5 in | Wt 144.1 lb

## 2023-04-16 DIAGNOSIS — R7989 Other specified abnormal findings of blood chemistry: Secondary | ICD-10-CM | POA: Diagnosis not present

## 2023-04-16 NOTE — Patient Instructions (Signed)
our lab is open on Mondays 1pm to 4pm, Tuesdays 8:30am to 4pm. Wednesdays 8:30am to 4pm and thursdays 1pm to 4pm.   

## 2023-04-16 NOTE — Progress Notes (Signed)
Arlyss Repress, MD [address]  [address]  [place], [place] [address]  Main: [phone]  Fax: [phone]    Gastroenterology Consultation  Referring Provider:     Dale [place], MD Primary Care Physician:  Dale Kings[place], MD Primary Gastroenterologist:  Dr. Arlyss Repress Reason for Consultation: Elevated LFTs        HPI:   Cindy Arnold is a 72 y.o. female referred by Dr. Dale Bleckley, MD  for consultation & management of elevated LFTs.  Patient is found to have significantly elevated LFTs on 02/06/2023 on routine physical.  ALT and phosphatase 179, AST and ALT more than 10 times upper limit of normal.  Acute viral hepatitis panel for A, B and C came back negative.  Hepatitis B E antigen nonreactive.  Her LFTs are gradually improving.  TLudwig Clarks has been normal.  Patient denies any symptoms including nausea, vomiting, recent diarrheal illness, sick contacts, clay colored stools, dark urine, jaundice, generalized pruritus, weight loss or abdominal pain.  She underwent complete abdominal ultrasound which revealed small liver cyst only. Apparently, patient started taking oral eye supplement called sightcare and sightcollagen which contains natural ingredients and started in January 2024.  She does take several other vitamin and mineral supplements for a long time.  She is very active, otherwise healthy  Her ophthalmologist is at [facility], received Avastin intraocular injection in October.  Admits to drinking alcohol 1 or 2 glasses of wine or beer weekly     Latest Ref Rng & Units 03/12/2023    7:45 AM 02/15/2023    7:36 AM 02/11/2023    8:26 AM  Hepatic Function  Total Protein 6.0 - 8.3 g/dL 6.5  6.7  6.6   Albumin 3.5 - 5.2 g/dL 3.9  4.2  4.2   AST 0 - 37 U/L 201  305  366   ALT 0 - 35 U/L 316  542  644   Alk Phosphatase 39 - 117 U/L 135  160  166   Total Bilirubin 0.2 - 1.2 mg/dL 0.4  0.6  0.7   Bilirubin, Direct 0.0 - 0.3 mg/dL 0.1  0.2  0.2     NSAIDs:  None  Antiplts/Anticoagulants/Anti thrombotics: None  GI Procedures: Colonoscopy in 2023 Small polyp was removed DIAGNOSIS:  A. COLON POLYP, CECUM; COLD BIOPSY:  - POLYPOID FRAGMENT OF BENIGN COLONIC MUCOSA WITH SUPERFICIAL REACTIVE  CHANGES.  - NEGATIVE FOR DYSPLASIA AND MALIGNANCY.   B. COLON POLYP, ASCENDING; COLD SNARE:  - SESSILE SERRATED POLYP.  - NEGATIVE FOR DYSPLASIA AND MALIGNANCY.   Past Medical History:  Diagnosis Date   Basal cell carcinoma    multiple removed/[facility]   Breast cancer 12/13   s/p right lumpectomy, s/p chemo and XRT   DES exposure in utero    Hepatitis A    History of chicken pox    Hyperactive airway disease    Lupus anticoagulant disorder    Migraines    Miscarriage    x3    Past Surgical History:  Procedure Laterality Date   ABDOMINAL HYSTERECTOMY     BASAL CELL CARCINOMA EXCISION     Multiple   BREAST BIOPSY  1975   Cyst   BREAST LUMPECTOMY WITH AXILLARY LYMPH NODE DISSECTION  01/06/13, 01/22/13   Dr. Wendee Copp, [facility], second surgery for pos margin   CERVICAL FUSION  2001   COLONOSCOPY     COLONOSCOPY WITH PROPOFOL N/A 06/11/2022   Procedure: COLONOSCOPY WITH PROPOFOL;  Surgeon: Toney Reil, MD;  Location: [facility];  Service: Gastroenterology;  Laterality: N/A;   DILATION AND CURETTAGE OF UTERUS     x 3   LAPAROSCOPIC BILATERAL SALPINGO OOPHERECTOMY Bilateral 12/21/2020   Procedure: LAPAROSCOPIC BILATERAL SALPINGO OOPHORECTOMY;  Surgeon: Ward, Elenora Fender, MD;  Location: [facility];  Service: Gynecology;  Laterality: Bilateral;   VAGINAL DELIVERY     x4   WISDOM TOOTH EXTRACTION       Current Outpatient Medications:    albuterol (VENTOLIN HFA) 108 (90 Base) MCG/ACT inhaler, Inhale 2 puffs into the lungs every 6 (six) hours as needed for wheezing or shortness of breath., Disp: 18 g, Rfl: 0   Cholecalciferol (VITAMIN D3) 125 MCG (5000 UT) CAPS, Take 5,000 Units by mouth daily., Disp: , Rfl:    EVENING PRIMROSE  OIL PO, Take 1,300 mg by mouth., Disp: , Rfl:    Glucosamine-Chondroitin (MOVE FREE PO), Take 1 capsule by mouth daily. Cartilage/boron/hyalauronic acid, Disp: , Rfl:    ipratropium (ATROVENT) 0.06 % nasal spray, SPRAY TWICE INTO EACH NOSTRIL TWICE DAILY, Disp: 30 mL, Rfl: 2   LORazepam (ATIVAN) 0.5 MG tablet, TAKE 1 TABLET BY MOUTH DAILY AS NEEDED FOR ANXIETY, Disp: 30 tablet, Rfl: 0   MAGNESIUM BISGLYCINATE PO, Take 200 mg by mouth daily., Disp: , Rfl:    Multiple Minerals-Vitamins (CALCIUM CITRATE PLUS/MAGNESIUM PO), Take 2 tablets by mouth 2 (two) times daily. CAL CITRATE 500-MAGNESIUM 80-ZINC-10, Disp: , Rfl:    Omega-3 Fatty Acids (OMEGA-3 FISH OIL PO), Take 1,280 mg by mouth 2 (two) times daily., Disp: , Rfl:    pantoprazole (PROTONIX) 20 MG tablet, TAKE ONE TABLET BY MOUTH TWICE DAILY BEFORE A MEAL, Disp: 180 tablet, Rfl: 1   propranolol (INDERAL) 40 MG tablet, TAKE 1 TABLET BY MOUTH DAILY, Disp: 90 tablet, Rfl: 1   Riboflavin (VITAMIN B2 PO), Take 250 mg by mouth daily., Disp: , Rfl:    sertraline (ZOLOFT) 50 MG tablet, TAKE 1 TABLET BY MOUTH DAILY, Disp: 30 tablet, Rfl: 2   traZODone (DESYREL) 50 MG tablet, TAKE 1 TABLET BY MOUTH NIGHTLY AS NEEDEDFOR SLEEP, Disp: 30 tablet, Rfl: 1   BIOTIN PO, Take 10,000 mg by mouth daily.  (Patient not taking: Reported on 04/16/2023), Disp: , Rfl:    mometasone (ELOCON) 0.1 % ointment, Apply 0.01 % topically 2 (two) times daily. (Patient not taking: Reported on 04/16/2023), Disp: , Rfl:    Multiple Vitamins-Minerals (MULTIVITAMIN WOMENS 50+ ADV PO), Take 1 tablet by mouth daily. (Patient not taking: Reported on 04/16/2023), Disp: , Rfl:    Family History  Problem Relation Age of Onset   Pancreatic cancer Mother    Cancer Mother        Pancreatic   Arthritis Mother    Hyperlipidemia Mother    Hypertension Mother    Heart attack Father    Hyperlipidemia Father    Hypertension Father    Heart disease Father 39   Colon cancer Other    Cancer Other  50       Breast and Ovarian- dx 50's/Colon   Birth defects Maternal Aunt        Breast - in 57's   Stroke Maternal Grandmother    Birth defects Maternal Aunt        Breast - in 73's     Social History   Tobacco Use   Smoking status: Never   Smokeless tobacco: Never  Substance Use Topics   Alcohol use: Yes  Alcohol/week: 3.0 standard drinks of alcohol    Types: 3 Standard drinks or equivalent per week    Comment: 3 times a week   Drug use: No    Allergies as of 04/16/2023 - Review Complete 04/16/2023  Allergen Reaction Noted   Prednisone Other (See Comments) 06/18/2021    Review of Systems:    All systems reviewed and negative except where noted in HPI.   Physical Exam:  BP 103/66 (BP Location: Left Arm, Patient Position: Sitting, Cuff Size: Normal)   Pulse 66   Temp (!) 97.5 F (36.4 C) (Oral)   Ht 5' 8.5" (1.74 m)   Wt 144 lb 2 oz (65.4 kg)   BMI 21.60 kg/m  No LMP recorded. Patient is postmenopausal.  General:   Alert,  Well-developed, well-nourished, pleasant and cooperative in NAD Head:  Normocephalic and atraumatic. Eyes:  Sclera clear, no icterus.   Conjunctiva pink. Ears:  Normal auditory acuity. Nose:  No deformity, discharge, or lesions. Mouth:  No deformity or lesions,oropharynx pink & moist. Neck:  Supple; no masses or thyromegaly. Lungs:  Respirations even and unlabored.  Clear throughout to auscultation.   No wheezes, crackles, or rhonchi. No acute distress. Heart:  Regular rate and rhythm; no murmurs, clicks, rubs, or gallops. Abdomen:  Normal bowel sounds. Soft, non-tender and non-distended without masses, hepatosplenomegaly or hernias noted.  No guarding or rebound tenderness.   Rectal: Not performed Msk:  Symmetrical without gross deformities. Good, equal movement & strength bilaterally. Pulses:  Normal pulses noted. Extremities:  No clubbing or edema.  No cyanosis. Neurologic:  Alert and oriented x3;  grossly normal neurologically. Skin:   Intact without significant lesions or rashes. No jaundice. Psych:  Alert and cooperative. Normal mood and affect.  Imaging Studies: Reviewed  Assessment and Plan:   Cindy Arnold is a 72 y.o. pleasant Caucasian female with history of macular atrophy, otherwise healthy is seen in consultation for elevated LFTs  Elevated LFTs, first noticed in 01/2023 based on routine physical Temporal correlation with initiation of sight care oral eye supplements which contains natural ingredients in January 2024 Given that LFTs are gradually improving with no symptoms of acute liver failure, recommended to discontinue supplement and recheck LFTs in 1 month.  If persistently elevated, recommend secondary liver disease workup Acute viral hepatitis panel A, B and C nonreactive Abdominal ultrasound revealed normal liver, no evidence of biliary obstruction or liver lesions   Follow up in 2 to 3 months   Arlyss Repress, MD

## 2023-04-23 DIAGNOSIS — S66811A Strain of other specified muscles, fascia and tendons at wrist and hand level, right hand, initial encounter: Secondary | ICD-10-CM | POA: Diagnosis not present

## 2023-05-02 ENCOUNTER — Ambulatory Visit
Admission: RE | Admit: 2023-05-02 | Discharge: 2023-05-02 | Disposition: A | Discharge status: Home / Self Care | Payer: PPO | Source: Ambulatory Visit | Attending: Internal Medicine | Admitting: Internal Medicine

## 2023-05-02 DIAGNOSIS — E2839 Other primary ovarian failure: Secondary | ICD-10-CM | POA: Insufficient documentation

## 2023-05-02 DIAGNOSIS — M810 Age-related osteoporosis without current pathological fracture: Secondary | ICD-10-CM | POA: Diagnosis not present

## 2023-05-15 DIAGNOSIS — M7541 Impingement syndrome of right shoulder: Secondary | ICD-10-CM | POA: Diagnosis not present

## 2023-05-15 DIAGNOSIS — R7989 Other specified abnormal findings of blood chemistry: Secondary | ICD-10-CM | POA: Diagnosis not present

## 2023-05-16 ENCOUNTER — Encounter: Payer: Self-pay | Admitting: Internal Medicine

## 2023-05-16 DIAGNOSIS — M25519 Pain in unspecified shoulder: Secondary | ICD-10-CM | POA: Insufficient documentation

## 2023-05-16 LAB — HEPATIC FUNCTION PANEL
ALT: 67 IU/L — ABNORMAL HIGH (ref 0–32)
AST: 61 IU/L — ABNORMAL HIGH (ref 0–40)
Albumin: 4.3 g/dL (ref 3.8–4.8)
Alkaline Phosphatase: 100 IU/L (ref 44–121)
Bilirubin Total: 0.4 mg/dL (ref 0.0–1.2)
Bilirubin, Direct: 0.11 mg/dL (ref 0.00–0.40)
Total Protein: 6.5 g/dL (ref 6.0–8.5)

## 2023-06-10 ENCOUNTER — Other Ambulatory Visit (INDEPENDENT_AMBULATORY_CARE_PROVIDER_SITE_OTHER): Payer: PPO

## 2023-06-10 DIAGNOSIS — E7800 Pure hypercholesterolemia, unspecified: Secondary | ICD-10-CM | POA: Diagnosis not present

## 2023-06-10 DIAGNOSIS — F419 Anxiety disorder, unspecified: Secondary | ICD-10-CM | POA: Diagnosis not present

## 2023-06-10 LAB — CBC WITH DIFFERENTIAL/PLATELET
Basophils Absolute: 0.1 10*3/uL (ref 0.0–0.1)
Basophils Relative: 1.5 % (ref 0.0–3.0)
Eosinophils Absolute: 0.2 10*3/uL (ref 0.0–0.7)
Eosinophils Relative: 2.9 % (ref 0.0–5.0)
HCT: 40.1 % (ref 36.0–46.0)
Hemoglobin: 13.5 g/dL (ref 12.0–15.0)
Lymphocytes Relative: 36.6 % (ref 12.0–46.0)
Lymphs Abs: 2 10*3/uL (ref 0.7–4.0)
MCHC: 33.6 g/dL (ref 30.0–36.0)
MCV: 95.1 fl (ref 78.0–100.0)
Monocytes Absolute: 0.6 10*3/uL (ref 0.1–1.0)
Monocytes Relative: 10.4 % (ref 3.0–12.0)
Neutro Abs: 2.7 10*3/uL (ref 1.4–7.7)
Neutrophils Relative %: 48.6 % (ref 43.0–77.0)
Platelets: 292 10*3/uL (ref 150.0–400.0)
RBC: 4.22 Mil/uL (ref 3.87–5.11)
RDW: 13 % (ref 11.5–15.5)
WBC: 5.4 10*3/uL (ref 4.0–10.5)

## 2023-06-10 LAB — LIPID PANEL
Cholesterol: 200 mg/dL (ref 0–200)
HDL: 70 mg/dL (ref >39.00)
LDL Cholesterol: 117 mg/dL — ABNORMAL HIGH (ref 0–99)
NonHDL: 130.17
Total CHOL/HDL Ratio: 3
Triglycerides: 66 mg/dL (ref 0.0–149.0)
VLDL: 13.2 mg/dL (ref 0.0–40.0)

## 2023-06-10 LAB — HEPATIC FUNCTION PANEL
ALT: 31 U/L (ref 0–35)
AST: 31 U/L (ref 0–37)
Albumin: 4.2 g/dL (ref 3.5–5.2)
Alkaline Phosphatase: 79 U/L (ref 39–117)
Bilirubin, Direct: 0.1 mg/dL (ref 0.0–0.3)
Total Bilirubin: 0.6 mg/dL (ref 0.2–1.2)
Total Protein: 7.3 g/dL (ref 6.0–8.3)

## 2023-06-10 LAB — BASIC METABOLIC PANEL
BUN: 14 mg/dL (ref 6–23)
CO2: 31 mEq/L (ref 19–32)
Calcium: 9.4 mg/dL (ref 8.4–10.5)
Chloride: 102 mEq/L (ref 96–112)
Creatinine, Ser: 0.82 mg/dL (ref 0.40–1.20)
GFR: 72.14 mL/min (ref >60.00)
Glucose, Bld: 95 mg/dL (ref 70–99)
Potassium: 4 mEq/L (ref 3.5–5.1)
Sodium: 138 mEq/L (ref 135–145)

## 2023-06-10 LAB — TSH: TSH: 4.11 u[IU]/mL (ref 0.35–5.50)

## 2023-06-12 ENCOUNTER — Ambulatory Visit: Payer: PPO | Admitting: Internal Medicine

## 2023-06-13 ENCOUNTER — Ambulatory Visit: Payer: PPO | Admitting: Internal Medicine

## 2023-06-17 DIAGNOSIS — H3589 Other specified retinal disorders: Secondary | ICD-10-CM | POA: Diagnosis not present

## 2023-06-17 DIAGNOSIS — H35052 Retinal neovascularization, unspecified, left eye: Secondary | ICD-10-CM | POA: Diagnosis not present

## 2023-06-19 ENCOUNTER — Ambulatory Visit (INDEPENDENT_AMBULATORY_CARE_PROVIDER_SITE_OTHER): Payer: PPO | Admitting: Internal Medicine

## 2023-06-19 ENCOUNTER — Encounter: Payer: Self-pay | Admitting: Internal Medicine

## 2023-06-19 VITALS — BP 104/70 | HR 76 | Temp 97.9°F | Resp 16 | Ht 68.5 in | Wt 144.0 lb

## 2023-06-19 DIAGNOSIS — I471 Supraventricular tachycardia, unspecified: Secondary | ICD-10-CM

## 2023-06-19 DIAGNOSIS — J4520 Mild intermittent asthma, uncomplicated: Secondary | ICD-10-CM

## 2023-06-19 DIAGNOSIS — Z853 Personal history of malignant neoplasm of breast: Secondary | ICD-10-CM

## 2023-06-19 DIAGNOSIS — G629 Polyneuropathy, unspecified: Secondary | ICD-10-CM | POA: Diagnosis not present

## 2023-06-19 DIAGNOSIS — E7800 Pure hypercholesterolemia, unspecified: Secondary | ICD-10-CM | POA: Diagnosis not present

## 2023-06-19 DIAGNOSIS — K219 Gastro-esophageal reflux disease without esophagitis: Secondary | ICD-10-CM

## 2023-06-19 DIAGNOSIS — G43809 Other migraine, not intractable, without status migrainosus: Secondary | ICD-10-CM | POA: Diagnosis not present

## 2023-06-19 DIAGNOSIS — R7989 Other specified abnormal findings of blood chemistry: Secondary | ICD-10-CM | POA: Diagnosis not present

## 2023-06-19 DIAGNOSIS — F419 Anxiety disorder, unspecified: Secondary | ICD-10-CM | POA: Diagnosis not present

## 2023-06-19 MED ORDER — PROPRANOLOL HCL 40 MG PO TABS: 40.0000 mg | ORAL_TABLET | Freq: Every day | ORAL | 3 refills | Status: DC

## 2023-06-19 MED ORDER — ALBUTEROL SULFATE HFA 108 (90 BASE) MCG/ACT IN AERS: 2.0000 | INHALATION_SPRAY | Freq: Four times a day (QID) | RESPIRATORY_TRACT | 1 refills | Status: DC | PRN

## 2023-06-19 MED ORDER — SERTRALINE HCL 50 MG PO TABS: 50.0000 mg | ORAL_TABLET | Freq: Every day | ORAL | 1 refills | Status: DC

## 2023-06-19 MED ORDER — PANTOPRAZOLE SODIUM 20 MG PO TBEC: DELAYED_RELEASE_TABLET | ORAL | 3 refills | Status: DC

## 2023-06-19 MED ORDER — ALENDRONATE SODIUM 70 MG PO TABS
70.0000 mg | ORAL_TABLET | ORAL | 3 refills | Status: DC
Start: 2023-06-19 — End: 2024-02-17

## 2023-06-19 MED ORDER — LORAZEPAM 0.5 MG PO TABS: 0.5000 mg | ORAL_TABLET | Freq: Every day | ORAL | 0 refills | Status: DC | PRN

## 2023-06-19 MED ORDER — TRAZODONE HCL 50 MG PO TABS: ORAL_TABLET | ORAL | 1 refills | Status: DC

## 2023-06-19 NOTE — Assessment & Plan Note (Signed)
The 10-year ASCVD risk score (Arnett DK, et al., 2019) is: 6.8%   Values used to calculate the score:     Age: 72 years     Sex: Female     Is Non-Hispanic African American: No     Diabetic: No     Tobacco smoker: No     Systolic Blood Pressure: 103 mmHg     Is BP treated: No     HDL Cholesterol: 70 mg/dL     Total Cholesterol: 200 mg/dL  Low cholesterol diet and exercise. Follow lipid panel.

## 2023-06-19 NOTE — Progress Notes (Signed)
Subjective:    [name], female    DOB: 10-18-1951, 72 y.o.   MRN: [id number]  Patient here for  Chief Complaint  Patient presents with   Medical Management of Chronic Issues    HPI Here to follow up regarding hypercholesterolemia, GERD, increased stress/anxiety and history of paroxysmal SVT. On protonix. Controlling acid reflux.  On zoloft.  Takes propranolol for ocular migraines.  Saw cardiology in f/u 11/30/22 - f/u paroxysmal SVT.  Stable.  No further w/up warranted.  F/u prn. Had f/u with oncology - 01/30/23 - completed 5 years of aromasin 08/2018.  01/30/23 - mammogram benign.  Plans to start getting mammograms here locally now. Saw Dr Dalbert Garnet 02/07/23 - evaluation - pelvic organ prolapse.  Recommended continuing PFPT. Also recommended annual cervical and upper 1/3 vaginal pap - due to DES exposure.  Had covid 05/26/23.  Feeling better.  Getting back to normal.  Cough is better.  Was using albuterol prn.  Previously reported bilateral toe numbness and tingling.  Referred for NCS. NCS c/w generalized sensorimotor polyneuropathy.  Stays active.  No chest pain reported.  Does report some memory issues.     Past Medical History:  Diagnosis Date   Basal cell carcinoma    multiple removed/[facility]   Breast cancer (HCC) 12/13   s/p right lumpectomy, s/p chemo and XRT   DES exposure in utero    Hepatitis A    History of chicken pox    Hyperactive airway disease    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HCC)    Migraines    Miscarriage    x3   Past Surgical History:  Procedure Laterality Date   ABDOMINAL HYSTERECTOMY     BASAL CELL CARCINOMA EXCISION     Multiple   BREAST BIOPSY  1975   Cyst   BREAST LUMPECTOMY WITH AXILLARY LYMPH NODE DISSECTION  01/06/13, 01/22/13   Dr. Wendee Copp, [facility], second surgery for pos margin   CERVICAL FUSION  2001   COLONOSCOPY     COLONOSCOPY WITH PROPOFOL N/A 06/11/2022   Procedure: COLONOSCOPY WITH PROPOFOL;  Surgeon: Toney Reil,  MD;  Location: [facility] END[facility]COPY;  Service: Gastroenterology;  Laterality: N/A;   DILATION AND CURETTAGE OF UTERUS     x 3   LAPAROSCOPIC BILATERAL SALPINGO OOPHERECTOMY Bilateral 12/21/2020   Procedure: LAPAROSCOPIC BILATERAL SALPINGO OOPHORECTOMY;  Surgeon: Ward, Elenora Fender, MD;  Location: [facility] ORS;  Service: Gynecology;  Laterality: Bilateral;   VAGINAL DELIVERY     x4   WISDOM TOOTH EXTRACTION     Family History  Problem Relation Age of Onset   Pancreatic cancer Mother    Cancer Mother        Pancreatic   Arthritis Mother    Hyperlipidemia Mother    Hypertension Mother    Heart attack Father    Hyperlipidemia Father    Hypertension Father    Heart disease Father 44   Colon cancer Other    Cancer Other 50       Breast and Ovarian- dx 50's/Colon   Birth defects Maternal Aunt        Breast - in 74's   Stroke Maternal Grandmother    Birth defects Maternal Aunt        Breast - in 7's   Social History   Socioeconomic History   Marital status: Married    Spouse name: ED   Number of children: 4   Years of education: Not on file   Highest  education level: Bachelor's degree (e.g., BA, AB, BS)  Occupational History   Occupation: Research officer, 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    Social Determinants of Health   Financial Resource Strain: Low Risk  (06/15/2023)   Overall Financial Resource Strain (CARDIA)    Difficulty of Paying Living Expenses: Not hard at all  Food Insecurity: No Food Insecurity (06/15/2023)   Hunger Vital Sign    Worried About Running Out of Food in the Last Year:  Never true    Ran Out of Food in the Last Year: Never true  Transportation Needs: No Transportation Needs (06/15/2023)   PRAPARE - Administrator, Civil Service (Medical): No    Lack of Transportation (Non-Medical): No  Physical Activity: Insufficiently Active (06/15/2023)   Exercise Vital Sign    Days of Exercise per Week: 1 day    Minutes of Exercise per Session: 10 min  Stress: No Stress Concern Present (06/15/2023)   [employer] of Occupational Health - Occupational Stress Questionnaire    Feeling of Stress : Only a little  Social Connections: Socially Integrated (06/15/2023)   Social Connection and Isolation Panel [NHANES]    Frequency of Communication with Friends and Family: Not on file    Frequency of Social Gatherings with Friends and Family: More than three times a week    Attends Religious Services: More than 4 times per year    Active Member of [employer] or Organizations: Yes    Attends Engineer, structural: More than 4 times per year    Marital Status: Married     Review of Systems  Constitutional:  Negative for appetite change and unexpected weight change.  HENT:  Negative for congestion and sinus pressure.   Respiratory:  Negative for cough, chest tightness and shortness of breath.   Cardiovascular:  Negative for chest pain, palpitations and leg swelling.  Gastrointestinal:  Negative for abdominal pain, diarrhea, nausea and vomiting.  Genitourinary:  Negative for difficulty urinating and dysuria.  Musculoskeletal:  Negative for joint swelling and myalgias.  Skin:  Negative for color change and rash.  Neurological:  Negative for dizziness and headaches.  Psychiatric/Behavioral:  Negative for agitation and dysphoric mood.        Objective:     BP 104/70   Pulse 76   Temp 97.9 F (36.6 C)   Resp 16   Ht 5' 8.5" (1.74 m)   Wt 144 lb (65.3 kg)   SpO2 98%   BMI 21.58 kg/m  Wt Readings from Last 3 Encounters:  06/19/23 144 lb (65.3 kg)   04/16/23 144 lb 2 oz (65.4 kg)  02/11/23 138 lb (62.6 kg)    Physical Exam Vitals reviewed.  Constitutional:      General: She is not in acute distress.    Appearance: Normal appearance.  HENT:     Head: Normocephalic and atraumatic.     Right Ear: External ear normal.     Left Ear: External ear normal.  Eyes:  General: No scleral icterus.       Right eye: No discharge.        Left eye: No discharge.     Conjunctiva/sclera: Conjunctivae normal.  Neck:     Thyroid: No thyromegaly.  Cardiovascular:     Rate and Rhythm: Normal rate and regular rhythm.  Pulmonary:     Effort: No respiratory distress.     Breath sounds: Normal breath sounds. No wheezing.  Abdominal:     General: Bowel sounds are normal.     Palpations: Abdomen is soft.     Tenderness: There is no abdominal tenderness.  Musculoskeletal:        General: No swelling or tenderness.     Cervical back: Neck supple. No tenderness.  Lymphadenopathy:     Cervical: No cervical adenopathy.  Skin:    Findings: No erythema or rash.  Neurological:     Mental Status: She is alert.  Psychiatric:        Mood and Affect: Mood normal.        Behavior: Behavior normal.      Outpatient Encounter Medications as of 06/19/2023  Medication Sig   alendronate (FOSAMAX) 70 MG tablet Take 1 tablet (70 mg total) by mouth every 7 (seven) days. Take with a full glass of water on an empty stomach.   albuterol (VENTOLIN HFA) 108 (90 Base) MCG/ACT inhaler Inhale 2 puffs into the lungs every 6 (six) hours as needed for wheezing or shortness of breath.   BIOTIN PO Take 10,000 mg by mouth daily.  (Patient not taking: Reported on 04/16/2023)   Cholecalciferol (VITAMIN D3) 125 MCG (5000 UT) CAPS Take 5,000 Units by mouth daily.   EVENING PRIMROSE OIL PO Take 1,300 mg by mouth.   Glucosamine-Chondroitin (MOVE FREE PO) Take 1 capsule by mouth daily. Cartilage/boron/hyalauronic acid   ipratropium (ATROVENT) 0.06 % nasal spray SPRAY TWICE  INTO EACH NOSTRIL TWICE DAILY   LORazepam (ATIVAN) 0.5 MG tablet Take 1 tablet (0.5 mg total) by mouth daily as needed. for anxiety   MAGNESIUM BISGLYCINATE PO Take 200 mg by mouth daily.   Multiple Minerals-Vitamins (CALCIUM CITRATE PLUS/MAGNESIUM PO) Take 2 tablets by mouth 2 (two) times daily. CAL CITRATE 500-MAGNESIUM 80-ZINC-10   Omega-3 Fatty Acids (OMEGA-3 FISH OIL PO) Take 1,280 mg by mouth 2 (two) times daily.   pantoprazole (PROTONIX) 20 MG tablet TAKE ONE TABLET BY MOUTH TWICE DAILY BEFORE A MEAL   propranolol (INDERAL) 40 MG tablet Take 1 tablet (40 mg total) by mouth daily.   Riboflavin (VITAMIN B2 PO) Take 250 mg by mouth daily.   sertraline (ZOLOFT) 50 MG tablet Take 1 tablet (50 mg total) by mouth daily.   traZODone (DESYREL) 50 MG tablet TAKE 1 TABLET BY MOUTH NIGHTLY AS NEEDEDFOR SLEEP   [DISCONTINUED] albuterol (VENTOLIN HFA) 108 (90 Base) MCG/ACT inhaler Inhale 2 puffs into the lungs every 6 (six) hours as needed for wheezing or shortness of breath.   [DISCONTINUED] LORazepam (ATIVAN) 0.5 MG tablet TAKE 1 TABLET BY MOUTH DAILY AS NEEDED FOR ANXIETY   [DISCONTINUED] mometasone (ELOCON) 0.1 % ointment Apply 0.01 % topically 2 (two) times daily. (Patient not taking: Reported on 04/16/2023)   [DISCONTINUED] Multiple Vitamins-Minerals (MULTIVITAMIN WOMENS 50+ ADV PO) Take 1 tablet by mouth daily. (Patient not taking: Reported on 04/16/2023)   [DISCONTINUED] pantoprazole (PROTONIX) 20 MG tablet TAKE ONE TABLET BY MOUTH TWICE DAILY BEFORE A MEAL   [DISCONTINUED] propranolol (INDERAL) 40 MG tablet TAKE 1 TABLET BY MOUTH DAILY   [  DISCONTINUED] sertraline (ZOLOFT) 50 MG tablet TAKE 1 TABLET BY MOUTH DAILY   [DISCONTINUED] traZODone (DESYREL) 50 MG tablet TAKE 1 TABLET BY MOUTH NIGHTLY AS NEEDEDFOR SLEEP   No facility-administered encounter medications on file as of 06/19/2023.     Lab Results  Component Value Date   WBC 5.4 06/10/2023   HGB 13.5 06/10/2023   HCT 40.1 06/10/2023    PLT 292.0 06/10/2023   GLUCOSE 95 06/10/2023   CHOL 200 06/10/2023   TRIG 66.0 06/10/2023   HDL 70.00 06/10/2023   LDLCALC 117 (H) 06/10/2023   ALT 31 06/10/2023   AST 31 06/10/2023   NA 138 06/10/2023   K 4.0 06/10/2023   CL 102 06/10/2023   CREATININE 0.82 06/10/2023   BUN 14 06/10/2023   CO2 31 06/10/2023   TSH 4.11 06/10/2023   INR 0.9 02/15/2023   MICROALBUR <0.7 11/14/2015    DG Bone Density  Result Date: 05/02/2023 EXAM: DUAL X-RAY ABSORPTIOMETRY (DXA) FOR BONE MINERAL DENSITY IMPRESSION: Your patient Ipek Martin completed a BMD test on 05/02/2023 using the [employer] DXA System (software version: 14.10) manufactured by [employer]. The following summarizes the results of our evaluation. Technologist: MTB PATIENT BIOGRAPHICAL: Name: Rickesha, Whitter Patient ID: [id number] Birth Date: Nov 07, 1951 Height: 69.0 in. Gender: Female Exam Date: 05/02/2023 Weight: 143.0 lbs. Indications: History of Spinal Surgery, Oophorectomy Bilateral, Postmenopausal Fractures: Treatments: Calcium, Vitamin D DENSITOMETRY RESULTS: Site         Region     Measured Date Measured Age WHO Classification Young Adult T-score BMD         %Change vs. Previous Significant Change (*) AP Spine L1-L2 05/02/2023 70.9 Osteopenia -2.0 0.927 g/cm2 - - DualFemur Neck Right 05/02/2023 70.9 Osteopenia -2.4 0.709 g/cm2 - - DualFemur Total Mean 05/02/2023 70.9 Osteopenia -1.8 0.777 g/cm2 - - Left Forearm Radius 33% 05/02/2023 70.9 Osteoporosis -2.7 0.636 g/cm2 - - ASSESSMENT: The BMD measured at Forearm Radius 33% is 0.636 g/cm2 with a T-score of -2.7. This patient is considered osteoporotic according to World Health Organization [facility]) criteria. The scan quality is good. L-3 and L-4 were excluded due to degenerative changes. World Science writer [facility]) criteria for post-menopausal, Caucasian Women: Normal:                   T-score at or above -1 SD Osteopenia/low bone mass: T-score between -1 and -2.5 SD Osteoporosis:              T-score at or below -2.5 SD RECOMMENDATIONS: 1. All patients should optimize calcium and vitamin D intake. 2. Consider FDA-approved medical therapies in postmenopausal women and men aged 69 years and older, based on the following: a. A hip or vertebral(clinical or morphometric) fracture b. T-score < -2.5 at the femoral neck or spine after appropriate evaluation to exclude secondary causes c. Low bone mass (T-score between -1.0 and -2.5 at the femoral neck or spine) and a 10-year probability of a hip fracture > 3% or a 10-year probability of a major osteoporosis-related fracture > 20% based on the [place]-adapted WHO algorithm 3. Clinician judgment and/or patient preferences may indicate treatment for people with 10-year fracture probabilities above or below these levels FOLLOW-UP: People with diagnosed cases of osteoporosis or at high risk for fracture should have regular bone mineral density tests. For patients eligible for Medicare, routine testing is allowed once every 2 years. The testing frequency can be increased to one year for patients who have rapidly progressing disease, those  who are receiving or discontinuing medical therapy to restore bone mass, or have additional risk factors. I have reviewed this report, and agree with the above findings. Mark A. Tyron Russell, M.D. [facility] Electronically Signed   By: Ulyses Southward M.D.   On: 05/02/2023 15:44       Assessment & Plan:  Hypercholesterolemia Assessment & Plan: The 10-year ASCVD risk score (Arnett DK, et al., 2019) is: 6.8%   Values used to calculate the score:     Age: 53 years     Sex: Female     Is Non-Hispanic African American: No     Diabetic: No     Tobacco smoker: No     Systolic Blood Pressure: 103 mmHg     Is BP treated: No     HDL Cholesterol: 70 mg/dL     Total Cholesterol: 200 mg/dL  Low cholesterol diet and exercise. Follow lipid panel.   Orders: -     Lipid panel; Future -     Hepatic function panel;  Future -     Basic metabolic panel; Future  Abnormal liver function tests Assessment & Plan: Recent elevation in ALT, AST and alk phos.  Ultrasound - no acute findings.  Small liver cyst. Discussed any new medications/supplements.  No increased alcohol.  She cut out supplements.  Recent liver panel wnl.  Saw GI.    Anxiety Assessment & Plan: Overall appears to be doing well.  On zoloft. Follow.    Mild intermittent asthma, unspecified whether complicated Assessment & Plan: Breathing stable. Recent covid.  No increased sob reported.  Has albuterol to use prn.    Gastroesophageal reflux disease, unspecified whether esophagitis present Assessment & Plan: No upper symptoms reported. On protonix.    History of breast cancer Assessment & Plan: Mammogram 01/30/23 - Birads II.    Other migraine without status migrainosus, not intractable Assessment & Plan: Has a history of migraines.  Headache flares intermittently.  Overall stable.    Paroxysmal SVT (supraventricular tachycardia) Assessment & Plan: Saw cardiology previously.  Stable.  Continue propranolol.    Neuropathy Assessment & Plan: NCS c/w polyneuropathy.  Appears to be stable.  Follow. Last B12 level wnl.    Other orders -     Pantoprazole Sodium; TAKE ONE TABLET BY MOUTH TWICE DAILY BEFORE A MEAL  Dispense: 180 tablet; Refill: 3 -     Propranolol HCl; Take 1 tablet (40 mg total) by mouth daily.  Dispense: 90 tablet; Refill: 3 -     Sertraline HCl; Take 1 tablet (50 mg total) by mouth daily.  Dispense: 90 tablet; Refill: 1 -     traZODone HCl; TAKE 1 TABLET BY MOUTH NIGHTLY AS NEEDEDFOR SLEEP  Dispense: 30 tablet; Refill: 1 -     Albuterol Sulfate HFA; Inhale 2 puffs into the lungs every 6 (six) hours as needed for wheezing or shortness of breath.  Dispense: 18 g; Refill: 1 -     LORazepam; Take 1 tablet (0.5 mg total) by mouth daily as needed. for anxiety  Dispense: 30 tablet; Refill: 0 -     Alendronate Sodium; Take  1 tablet (70 mg total) by mouth every 7 (seven) days. Take with a full glass of water on an empty stomach.  Dispense: 12 tablet; Refill: 3     Dale Switzer, MD

## 2023-06-23 ENCOUNTER — Encounter: Payer: Self-pay | Admitting: Internal Medicine

## 2023-06-23 DIAGNOSIS — G629 Polyneuropathy, unspecified: Secondary | ICD-10-CM | POA: Insufficient documentation

## 2023-06-23 NOTE — Assessment & Plan Note (Signed)
Saw cardiology previously.  Stable.  Continue propranolol.  

## 2023-06-23 NOTE — Assessment & Plan Note (Signed)
Overall appears to be doing well.  On zoloft. Follow.  

## 2023-06-23 NOTE — Assessment & Plan Note (Signed)
Recent elevation in ALT, AST and alk phos.  Ultrasound - no acute findings.  Small liver cyst. Discussed any new medications/supplements.  No increased alcohol.  She cut out supplements.  Recent liver panel wnl.  Saw GI.

## 2023-06-23 NOTE — Assessment & Plan Note (Signed)
Mammogram 01/30/23 - Birads II.  

## 2023-06-23 NOTE — Assessment & Plan Note (Signed)
No upper symptoms reported.  On protonix.   

## 2023-06-23 NOTE — Assessment & Plan Note (Signed)
Has a history of migraines.  Headache flares intermittently.  Overall stable.  

## 2023-06-23 NOTE — Assessment & Plan Note (Signed)
NCS c/w polyneuropathy.  Appears to be stable.  Follow. Last B12 level wnl.

## 2023-06-23 NOTE — Assessment & Plan Note (Signed)
Breathing stable. Recent covid.  No increased sob reported.  Has albuterol to use prn.

## 2023-06-24 ENCOUNTER — Ambulatory Visit (INDEPENDENT_AMBULATORY_CARE_PROVIDER_SITE_OTHER): Payer: PPO | Admitting: *Deleted

## 2023-06-24 VITALS — Ht 68.0 in | Wt 144.0 lb

## 2023-06-24 DIAGNOSIS — Z0000 Encounter for general adult medical examination without abnormal findings: Secondary | ICD-10-CM | POA: Diagnosis not present

## 2023-06-24 NOTE — Progress Notes (Signed)
Subjective:   Cindy Arnold is a 72 y.o. female who presents for Medicare Annual (Subsequent) preventive examination.  Visit Complete: Virtual  I connected with  Cindy Arnold on 06/24/23 by a audio enabled telemedicine application and verified that I am speaking with the correct person using two identifiers.  Patient Location: Home  Provider Location: Office/Clinic  I discussed the limitations of evaluation and management by telemedicine. The patient expressed understanding and agreed to proceed.  Patient Medicare AWV questionnaire was completed by the patient on 06/15/23; I have confirmed that all information answered by patient is correct and no changes since this date.  Review of Systems     Cardiac Risk Factors include: advanced age (>22men, >78 women);dyslipidemia;sedentary lifestyle;Other (see comment)     Objective:    Today's Vitals   06/24/23 1217  Weight: 144 lb (65.3 kg)  Height: 5\' 8"  (1.727 m)   Body mass index is 21.9 kg/m.     06/24/2023   12:41 PM 06/11/2022   10:50 AM 05/25/2022   11:31 AM 04/28/2021    1:31 PM 12/14/2020   11:28 AM 11/18/2020    8:25 PM 04/27/2020   11:49 AM  Advanced Directives  Does Patient Have a Medical Advance Directive? No No No No No No No  Would patient like information on creating a medical advance directive?  No - Patient declined No - Patient declined No - Patient declined   No - Patient declined    Current Medications (verified) Outpatient Encounter Medications as of 06/24/2023  Medication Sig   albuterol (VENTOLIN HFA) 108 (90 Base) MCG/ACT inhaler Inhale 2 puffs into the lungs every 6 (six) hours as needed for wheezing or shortness of breath.   alendronate (FOSAMAX) 70 MG tablet Take 1 tablet (70 mg total) by mouth every 7 (seven) days. Take with a full glass of water on an empty stomach.   Cholecalciferol (VITAMIN D3) 125 MCG (5000 UT) CAPS Take 5,000 Units by mouth daily.   Glucosamine-Chondroitin (MOVE FREE PO) Take 1  capsule by mouth daily. Cartilage/boron/hyalauronic acid   ipratropium (ATROVENT) 0.06 % nasal spray SPRAY TWICE INTO EACH NOSTRIL TWICE DAILY   LORazepam (ATIVAN) 0.5 MG tablet Take 1 tablet (0.5 mg total) by mouth daily as needed. for anxiety   MAGNESIUM BISGLYCINATE PO Take 200 mg by mouth daily.   Multiple Minerals-Vitamins (CALCIUM CITRATE PLUS/MAGNESIUM PO) Take 2 tablets by mouth 2 (two) times daily. CAL CITRATE 500-MAGNESIUM 80-ZINC-10   Omega-3 Fatty Acids (OMEGA-3 FISH OIL PO) Take 1,280 mg by mouth 2 (two) times daily.   pantoprazole (PROTONIX) 20 MG tablet TAKE ONE TABLET BY MOUTH TWICE DAILY BEFORE A MEAL   propranolol (INDERAL) 40 MG tablet Take 1 tablet (40 mg total) by mouth daily.   sertraline (ZOLOFT) 50 MG tablet Take 1 tablet (50 mg total) by mouth daily.   traZODone (DESYREL) 50 MG tablet TAKE 1 TABLET BY MOUTH NIGHTLY AS NEEDEDFOR SLEEP   BIOTIN PO Take 10,000 mg by mouth daily.  (Patient not taking: Reported on 06/24/2023)   EVENING PRIMROSE OIL PO Take 1,300 mg by mouth. (Patient not taking: Reported on 06/24/2023)   Riboflavin (VITAMIN B2 PO) Take 250 mg by mouth daily. (Patient not taking: Reported on 06/24/2023)   No facility-administered encounter medications on file as of 06/24/2023.    Allergies (verified) Prednisone   History: Past Medical History:  Diagnosis Date   Basal cell carcinoma    multiple removed/[facility]   Breast cancer (  HCC) 12/13   s/p right lumpectomy, s/p chemo and XRT   DES exposure in utero    Hepatitis A    History of chicken pox    Hyperactive airway disease    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HCC)    Migraines    Miscarriage    x3   Past Surgical History:  Procedure Laterality Date   ABDOMINAL HYSTERECTOMY     BASAL CELL CARCINOMA EXCISION     Multiple   BREAST BIOPSY  1975   Cyst   BREAST LUMPECTOMY WITH AXILLARY LYMPH NODE DISSECTION  01/06/13, 01/22/13   Dr. Wendee Arnold, [facility], second surgery for pos margin   CERVICAL FUSION   2001   COLONOSCOPY     COLONOSCOPY WITH PROPOFOL N/A 06/11/2022   Procedure: COLONOSCOPY WITH PROPOFOL;  Surgeon: Cindy Reil, MD;  Location: [facility] ENDOSCOPY;  Service: Gastroenterology;  Laterality: N/A;   DILATION AND CURETTAGE OF UTERUS     x 3   LAPAROSCOPIC BILATERAL SALPINGO OOPHERECTOMY Bilateral 12/21/2020   Procedure: LAPAROSCOPIC BILATERAL SALPINGO OOPHORECTOMY;  Surgeon: Arnold, Cindy Fender, MD;  Location: [facility] ORS;  Service: Gynecology;  Laterality: Bilateral;   VAGINAL DELIVERY     x4   WISDOM TOOTH EXTRACTION     Family History  Problem Relation Age of Onset   Pancreatic cancer Mother    Cancer Mother        Pancreatic   Arthritis Mother    Hyperlipidemia Mother    Hypertension Mother    Heart attack Father    Hyperlipidemia Father    Hypertension Father    Heart disease Father 74   Colon cancer Other    Cancer Other 50       Breast and Ovarian- dx 50's/Colon   Birth defects Maternal Aunt        Breast - in 54's   Stroke Maternal Grandmother    Birth defects Maternal Aunt        Breast - in 50's   Social History   Socioeconomic History   Marital status: Married    Spouse name: Cindy Arnold   Number of children: 4   Years of education: Not on file   Highest education level: Bachelor's degree (e.g., BA, AB, BS)  Occupational History   Occupation: Research officer, 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    Social Determinants of Health   Financial Resource Strain: Low Risk  (06/24/2023)   Overall Financial Resource Strain (CARDIA)    Difficulty of Paying Living Expenses: Not  hard at all  Food Insecurity: No Food Insecurity (06/24/2023)   Hunger Vital Sign    Worried About Running Out of Food in the Last Year: Never true    Ran Out of Food in the Last Year: Never true  Transportation Needs: No Transportation Needs (06/24/2023)   PRAPARE - Administrator, Civil Service (Medical): No    Lack of Transportation (Non-Medical): No  Physical Activity: Insufficiently Active (06/24/2023)   Exercise Vital Sign  Days of Exercise per Week: 4 days    Minutes of Exercise per Session: 30 min  Stress: No Stress Concern Present (06/24/2023)   [employer] of Occupational Health - Occupational Stress Questionnaire    Feeling of Stress : Not at all  Social Connections: Socially Integrated (06/24/2023)   Social Connection and Isolation Panel [NHANES]    Frequency of Communication with Friends and Family: More than three times a week    Frequency of Social Gatherings with Friends and Family: More than three times a week    Attends Religious Services: More than 4 times per year    Active Member of [employer] or Organizations: Yes    Attends Engineer, structural: More than 4 times per year    Marital Status: Married    Tobacco Counseling Counseling given: Not Answered   Clinical Intake:  Pre-visit preparation completed: Yes  Pain : No/denies pain     BMI - recorded: 21.9 Nutritional Status: BMI of 19-24  Normal Nutritional Risks: None Diabetes: No  How often do you need to have someone help you when you read instructions, pamphlets, or other written materials from your doctor or pharmacy?: 1 - Never  Interpreter Needed?: No  Information entered by :: R. Evana Runnels LPN   Activities of Daily Living    06/24/2023   12:20 PM 06/23/2023    8:57 PM  In your present state of health, do you have any difficulty performing the following activities:  Hearing? 1 0  Comment wears aids   Vision? 1 1  Comment difficulty, visual problems   Difficulty concentrating  or making decisions? 0 0  Walking or climbing stairs? 0 0  Dressing or bathing? 0 0  Doing errands, shopping? 0 0  Preparing Food and eating ? N N  Using the Toilet? N N  In the past six months, have you accidently leaked urine? N Y  Do you have problems with loss of bowel control? N N  Managing your Medications? N N  Managing your Finances? N N  Housekeeping or managing your Housekeeping? N N    Patient Care Team: Dale Roy, MD as PCP - General (Internal Medicine) Debbe Odea, MD as PCP - Cardiology (Cardiology)  Indicate any recent Medical Services you may have received from other than [facility] providers in the past year (date may be approximate).     Assessment:   This is a routine wellness examination for Cindy Arnold.  Hearing/Vision screen Hearing Screening - Comments:: Wears hearing aids Vision Screening - Comments:: Glasses sees eye doctor on regular basis  Dietary issues and exercise activities discussed:     Goals Addressed             This Visit's Progress    Patient Stated       Continue to walk for exercie      Depression Screen    06/24/2023   12:38 PM 10/10/2022    7:39 AM 07/10/2022   11:03 AM 05/25/2022   11:29 AM 03/19/2022    1:02 PM 04/28/2021    1:37 PM 03/21/2021    3:57 PM  PHQ 2/9 Scores  PHQ - 2 Score 0 0 0 0 0 0 0  PHQ- 9 Score 0          Fall Risk    06/24/2023   12:45 PM 06/23/2023    8:57 PM 10/10/2022    7:39 AM 07/10/2022   11:03 AM 05/25/2022   11:31 AM  Fall Risk  Falls in the past year? 0 0 0 0 0  Number falls in past yr: 0  0  0  Injury with Fall? 0  0    Risk for fall due to : No Fall Risks;Impaired vision   No Fall Risks   Follow up Falls prevention discussed;Falls evaluation completed;Education provided  Falls evaluation completed Falls evaluation completed Falls evaluation completed    MEDICARE RISK AT HOME:  Medicare Risk at Home - 06/24/23 1246     Any stairs in or around the home? Yes    If so, are there any  without handrails? Yes    Home free of loose throw rugs in walkways, pet beds, electrical cords, etc? Yes    Adequate lighting in your home to reduce risk of falls? Yes    Life alert? No    Use of a cane, walker or w/c? No    Grab bars in the bathroom? No    Shower chair or bench in shower? No    Elevated toilet seat or a handicapped toilet? No              Cognitive Function:    04/27/2020   12:05 PM  MMSE - Mini Mental State Exam  Not completed: Unable to complete        06/24/2023   12:42 PM 04/27/2019   12:12 PM  6CIT Screen  What Year? 0 points 0 points  What month? 0 points 0 points  What time? 0 points 0 points  Count back from 20 0 points 0 points  Months in reverse 0 points 0 points  Repeat phrase 2 points 0 points  Total Score 2 points 0 points    Immunizations Immunization History  Administered Date(s) Administered   Fluad Quad(high Dose 65+) 09/15/2020, 10/05/2021, 10/10/2022   Influenza Split 11/14/2011, 10/02/2014   Influenza, High Dose Seasonal PF 09/26/2018, 10/03/2019   Influenza-Unspecified 10/22/2013, 08/26/2015, 10/03/2016   Moderna SARS-COV2 Booster Vaccination 12/30/2020   PFIZER(Purple Top)SARS-COV-2 Vaccination 03/14/2020, 04/06/2020   Pneumococcal Conjugate-13 08/21/2017   Pneumococcal Polysaccharide-23 11/05/2013, 12/27/2019   Tdap 05/14/2011, 07/08/2017   Zoster Recombinant(Shingrix) 11/15/2017, 03/04/2018   Zoster, Live 10/25/2011    TDAP status: Up to date  Flu Vaccine status: Up to date  Pneumococcal vaccine status: Up to date  Covid-19 vaccine status: Declined, Education has been provided regarding the importance of this vaccine but patient still declined. Advised may receive this vaccine at local pharmacy or Health Dept.or vaccine clinic. Aware to provide a copy of the vaccination record if obtained from local pharmacy or Health Dept. Verbalized acceptance and understanding.  Qualifies for Shingles Vaccine? Yes   Zostavax  completed Yes   Shingrix Completed?: Yes  Screening Tests Health Maintenance  Topic Date Due   COVID-19 Vaccine (3 - Pfizer risk series) 12/24/2023 (Originally 01/27/2021)   INFLUENZA VACCINE  07/25/2023   MAMMOGRAM  01/31/2024   Medicare Annual Wellness (AWV)  06/23/2024   Colonoscopy  06/12/2027   DTaP/Tdap/Td (3 - Td or Tdap) 07/09/2027   Pneumonia Vaccine 61+ Years old  Completed   DEXA SCAN  Completed   Hepatitis C Screening  Completed   Zoster Vaccines- Shingrix  Completed   HPV VACCINES  Aged Out    Health Maintenance  There are no preventive care reminders to display for this patient.   Colorectal cancer screening: Type of screening: Colonoscopy. Completed 6/23. Repeat every 5 years  Mammogram status: Completed 01/30/23. Repeat every year  Bone Density status: Completed 06/02/23.  Results reflect: Bone density results: OSTEOPOROSIS. Repeat every 2 years.  Lung Cancer Screening: (Low Dose CT Chest recommended if Age 72-80 years, 20 pack-year currently smoking OR have quit w/in 15years.) does not qualify.    Additional Screening:  Hepatitis C Screening: does qualify; Completed 2/24  Vision Screening: Recommended annual ophthalmology exams for early detection of glaucoma and other disorders of the eye. Is the patient up to date with their annual eye exam?  Yes  Who is the provider or what is the name of the office in which the patient attends annual eye exams? [facility] If pt is not established with a provider, would they like to be referred to a provider to establish care? No .   Dental Screening: Recommended annual dental exams for proper oral hygiene    Community Resource Referral / Chronic Care Management: CRR required this visit?  No   CCM required this visit?  No     Plan:     I have personally reviewed and noted the following in the patient's chart:   Medical and social history Use of alcohol, tobacco or illicit drugs  Current medications and  supplements including opioid prescriptions. Patient is not currently taking opioid prescriptions. Functional ability and status Nutritional status Physical activity Advanced directives List of other physicians Hospitalizations, surgeries, and ER visits in previous 12 months Vitals Screenings to include cognitive, depression, and falls Referrals and appointments  In addition, I have reviewed and discussed with patient certain preventive protocols, quality metrics, and best practice recommendations. A written personalized care plan for preventive services as well as general preventive health recommendations were provided to patient.     Sydell Axon, LPN   12/29/1094   After Visit Summary: (MyChart) Due to this being a telephonic visit, the after visit summary with patients personalized plan was offered to patient via MyChart   Nurse Notes: none

## 2023-06-24 NOTE — Patient Instructions (Addendum)
Cindy Arnold , Thank you for taking time to come for your Medicare Wellness Visit. I appreciate your ongoing commitment to your health goals. Please review the following plan we discussed and let me know if I can assist you in the future.   These are the goals we discussed:  Goals       Advanced Care Planning (pt-stated)      I plan to complete advanced directive       Patient Stated      Continue to walk for exercie        This is a list of the screening recommended for you and due dates:  Health Maintenance  Topic Date Due   COVID-19 Vaccine (3 - Pfizer risk series) 12/24/2023*   Flu Shot  07/25/2023   Mammogram  01/31/2024   Medicare Annual Wellness Visit  06/23/2024   Colon Cancer Screening  06/12/2027   DTaP/Tdap/Td vaccine (3 - Td or Tdap) 07/09/2027   Pneumonia Vaccine  Completed   DEXA scan (bone density measurement)  Completed   Hepatitis C Screening  Completed   Zoster (Shingles) Vaccine  Completed   HPV Vaccine  Aged Out  *Topic was postponed. The date shown is not the original due date.    Advanced directives: Patient will work on this  Conditions/risks identified: none  Next appointment: Follow up in one year for your annual wellness visit 06/24/24 @ 11:30   Preventive Care 65 Years and Older, Female Preventive care refers to lifestyle choices and visits with your health care provider that can promote health and wellness. What does preventive care include? A yearly physical exam. This is also called an annual well check. Dental exams once or twice a year. Routine eye exams. Ask your health care provider how often you should have your eyes checked. Personal lifestyle choices, including: Daily care of your teeth and gums. Regular physical activity. Eating a healthy diet. Avoiding tobacco and drug use. Limiting alcohol use. Practicing safe sex. Taking low-dose aspirin every day. Taking vitamin and mineral supplements as recommended by your health care  provider. What happens during an annual well check? The services and screenings done by your health care provider during your annual well check will depend on your age, overall health, lifestyle risk factors, and family history of disease. Counseling  Your health care provider may ask you questions about your: Alcohol use. Tobacco use. Drug use. Emotional well-being. Home and relationship well-being. Sexual activity. Eating habits. History of falls. Memory and ability to understand (cognition). Work and work Astronomer. Reproductive health. Screening  You may have the following tests or measurements: Height, weight, and BMI. Blood pressure. Lipid and cholesterol levels. These may be checked every 5 years, or more frequently if you are over 9 years old. Skin check. Lung cancer screening. You may have this screening every year starting at age 30 if you have a 30-pack-year history of smoking and currently smoke or have quit within the past 15 years. Fecal occult blood test (FOBT) of the stool. You may have this test every year starting at age 65. Flexible sigmoidoscopy or colonoscopy. You may have a sigmoidoscopy every 5 years or a colonoscopy every 10 years starting at age 85. Hepatitis C blood test. Hepatitis B blood test. Sexually transmitted disease (STD) testing. Diabetes screening. This is done by checking your blood sugar (glucose) after you have not eaten for a while (fasting). You may have this done every 1-3 years. Bone density scan. This is done to  screen for osteoporosis. You may have this done starting at age 42. Mammogram. This may be done every 1-2 years. Talk to your health care provider about how often you should have regular mammograms. Talk with your health care provider about your test results, treatment options, and if necessary, the need for more tests. Vaccines  Your health care provider may recommend certain vaccines, such as: Influenza vaccine. This is  recommended every year. Tetanus, diphtheria, and acellular pertussis (Tdap, Td) vaccine. You may need a Td booster every 10 years. Zoster vaccine. You may need this after age 82. Pneumococcal 13-valent conjugate (PCV13) vaccine. One dose is recommended after age 20. Pneumococcal polysaccharide (PPSV23) vaccine. One dose is recommended after age 13. Talk to your health care provider about which screenings and vaccines you need and how often you need them. This information is not intended to replace advice given to you by your health care provider. Make sure you discuss any questions you have with your health care provider. Document Released: 01/06/2016 Document Revised: 08/29/2016 Document Reviewed: 10/11/2015 Elsevier Interactive Patient Education  2017 [place] Prevention in the Home Falls can cause injuries. They can happen to people of all ages. There are many things you can do to make your home safe and to help prevent falls. What can I do on the outside of my home? Regularly fix the edges of walkways and driveways and fix any cracks. Remove anything that might make you trip as you walk through a door, such as a raised step or threshold. Trim any bushes or trees on the path to your home. Use bright outdoor lighting. Clear any walking paths of anything that might make someone trip, such as rocks or tools. Regularly check to see if handrails are loose or broken. Make sure that both sides of any steps have handrails. Any raised decks and porches should have guardrails on the edges. Have any leaves, snow, or ice cleared regularly. Use sand or salt on walking paths during winter. Clean up any spills in your garage right away. This includes oil or grease spills. What can I do in the bathroom? Use night lights. Install grab bars by the toilet and in the tub and shower. Do not use towel bars as grab bars. Use non-skid mats or decals in the tub or shower. If you need to sit down in  the shower, use a plastic, non-slip stool. Keep the floor dry. Clean up any water that spills on the floor as soon as it happens. Remove soap buildup in the tub or shower regularly. Attach bath mats securely with double-sided non-slip rug tape. Do not have throw rugs and other things on the floor that can make you trip. What can I do in the bedroom? Use night lights. Make sure that you have a light by your bed that is easy to reach. Do not use any sheets or blankets that are too big for your bed. They should not hang down onto the floor. Have a firm chair that has side arms. You can use this for support while you get dressed. Do not have throw rugs and other things on the floor that can make you trip. What can I do in the kitchen? Clean up any spills right away. Avoid walking on wet floors. Keep items that you use a lot in easy-to-reach places. If you need to reach something above you, use a strong step stool that has a grab bar. Keep electrical cords out of the way.  Do not use floor polish or wax that makes floors slippery. If you must use wax, use non-skid floor wax. Do not have throw rugs and other things on the floor that can make you trip. What can I do with my stairs? Do not leave any items on the stairs. Make sure that there are handrails on both sides of the stairs and use them. Fix handrails that are broken or loose. Make sure that handrails are as long as the stairways. Check any carpeting to make sure that it is firmly attached to the stairs. Fix any carpet that is loose or worn. Avoid having throw rugs at the top or bottom of the stairs. If you do have throw rugs, attach them to the floor with carpet tape. Make sure that you have a light switch at the top of the stairs and the bottom of the stairs. If you do not have them, ask someone to add them for you. What else can I do to help prevent falls? Wear shoes that: Do not have high heels. Have rubber bottoms. Are comfortable  and fit you well. Are closed at the toe. Do not wear sandals. If you use a stepladder: Make sure that it is fully opened. Do not climb a closed stepladder. Make sure that both sides of the stepladder are locked into place. Ask someone to hold it for you, if possible. Clearly mark and make sure that you can see: Any grab bars or handrails. First and last steps. Where the edge of each step is. Use tools that help you move around (mobility aids) if they are needed. These include: Canes. Walkers. Scooters. Crutches. Turn on the lights when you go into a dark area. Replace any light bulbs as soon as they burn out. Set up your furniture so you have a clear path. Avoid moving your furniture around. If any of your floors are uneven, fix them. If there are any pets around you, be aware of where they are. Review your medicines with your doctor. Some medicines can make you feel dizzy. This can increase your chance of falling. Ask your doctor what other things that you can do to help prevent falls. This information is not intended to replace advice given to you by your health care provider. Make sure you discuss any questions you have with your health care provider. Document Released: 10/06/2009 Document Revised: 05/17/2016 Document Reviewed: 01/14/2015 Elsevier Interactive Patient Education  2017 [employer].

## 2023-07-10 ENCOUNTER — Other Ambulatory Visit: Payer: Self-pay | Admitting: Internal Medicine

## 2023-07-11 NOTE — Telephone Encounter (Signed)
Rx ok'd for fiorinal #30 with no refills.  Refilled albuterol inhaler

## 2023-07-15 ENCOUNTER — Telehealth: Payer: Self-pay | Admitting: Internal Medicine

## 2023-07-15 ENCOUNTER — Emergency Department
Admission: EM | Admit: 2023-07-15 | Discharge: 2023-07-15 | Disposition: A | Discharge status: Home / Self Care | Payer: PPO | Attending: Emergency Medicine | Admitting: Emergency Medicine

## 2023-07-15 ENCOUNTER — Other Ambulatory Visit: Payer: Self-pay

## 2023-07-15 DIAGNOSIS — R002 Palpitations: Secondary | ICD-10-CM | POA: Diagnosis not present

## 2023-07-15 DIAGNOSIS — I951 Orthostatic hypotension: Secondary | ICD-10-CM | POA: Diagnosis not present

## 2023-07-15 DIAGNOSIS — R42 Dizziness and giddiness: Secondary | ICD-10-CM | POA: Diagnosis not present

## 2023-07-15 LAB — BASIC METABOLIC PANEL
Anion gap: 7 (ref 5–15)
BUN: 17 mg/dL (ref 8–23)
CO2: 28 mmol/L (ref 22–32)
Calcium: 9.1 mg/dL (ref 8.9–10.3)
Chloride: 104 mmol/L (ref 98–111)
Creatinine, Ser: 0.68 mg/dL (ref 0.44–1.00)
GFR, Estimated: >60 mL/min (ref >60)
Glucose, Bld: 84 mg/dL (ref 70–99)
Potassium: 3.6 mmol/L (ref 3.5–5.1)
Sodium: 139 mmol/L (ref 135–145)

## 2023-07-15 LAB — CBC WITH DIFFERENTIAL/PLATELET
Abs Immature Granulocytes: 0.02 10*3/uL (ref 0.00–0.07)
Basophils Absolute: 0.1 10*3/uL (ref 0.0–0.1)
Basophils Relative: 1 %
Eosinophils Absolute: 0.1 10*3/uL (ref 0.0–0.5)
Eosinophils Relative: 1 %
HCT: 39.8 % (ref 36.0–46.0)
Hemoglobin: 13.9 g/dL (ref 12.0–15.0)
Immature Granulocytes: 0 %
Lymphocytes Relative: 22 %
Lymphs Abs: 1.5 10*3/uL (ref 0.7–4.0)
MCH: 32.6 pg (ref 26.0–34.0)
MCHC: 34.9 g/dL (ref 30.0–36.0)
MCV: 93.2 fL (ref 80.0–100.0)
Monocytes Absolute: 0.7 10*3/uL (ref 0.1–1.0)
Monocytes Relative: 9 %
Neutro Abs: 4.7 10*3/uL (ref 1.7–7.7)
Neutrophils Relative %: 67 %
Platelets: 279 10*3/uL (ref 150–400)
RBC: 4.27 MIL/uL (ref 3.87–5.11)
RDW: 12.6 % (ref 11.5–15.5)
WBC: 7 10*3/uL (ref 4.0–10.5)
nRBC: 0 % (ref 0.0–0.2)

## 2023-07-15 LAB — TROPONIN I (HIGH SENSITIVITY): Troponin I (High Sensitivity): 7 ng/L (ref <18)

## 2023-07-15 NOTE — ED Triage Notes (Addendum)
Pt states when she woke up this morning she was feeling some fluttering in her chest and checked her BP at home and it was 70s/40s. Pt was seen at [facility] and sent over here. Pt states she normally has low BP but not this low. Pt ambulatory, denies feeling dizzy or lightheaded at this time. Pt denies chest pain

## 2023-07-15 NOTE — Telephone Encounter (Signed)
[place] from [facility] called, [phone]. There is a drug interaction between albuterol (VENTOLIN HFA) 108 (90 Base) MCG/ACT inhaler and  propranolol (INDERAL) 40 MG tablet.

## 2023-07-15 NOTE — Telephone Encounter (Signed)
FYI

## 2023-07-15 NOTE — ED Triage Notes (Signed)
KC report: pt here for low bp (90/62 at [facility]) and heart flutter. Denies CP or SOB. Pt up walking in lobby.

## 2023-07-15 NOTE — Telephone Encounter (Signed)
Noted.  Albuterol inhaler is prn.  Doing well on current medications.

## 2023-07-15 NOTE — Telephone Encounter (Signed)
FYI patient called in with hypotension and was symptomatic (having lightheadedness). Was transferred to Access Nurse and recommended to go to ED. Patient has arrived at the ED.

## 2023-07-15 NOTE — Telephone Encounter (Signed)
Pt called stating her BP is low this am in the 70s and 80s over 50. Pt stated she is light headed. Sent to access nurse

## 2023-07-15 NOTE — ED Provider Notes (Signed)
[facility] Provider Note    Event Date/Time   First MD Initiated Contact with Patient 07/15/23 1350     (approximate)   History   Hypotension   HPI  Cindy Arnold is a 72 y.o. female   Past medical history of lupus, and baseline low blood pressures around 90s over 60s who presents emergency department because she had an episode of palpitations this morning and found her blood pressure to be lower than it normally is in the 70s systolic.  She had some associated orthostatic lightheadedness during this episode.  She called her clinic who advised to come to the emergency department for evaluation.     She feels better now.  Completely asymptomatic.  She denies chest pain, shortness of breath, respiratory infectious symptoms, GI or GU complaints recently.   P.o. intake has been normal.  She does not think she is dehydrated.  BP normal in the emergency department.  Independent Historian contributed to assessment above: Husband at bedside corroborates information given above      Physical Exam   Triage Vital Signs: ED Triage Vitals  Encounter Vitals Group     BP 07/15/23 1212 101/77     Systolic BP Percentile --      Diastolic BP Percentile --      Pulse Rate 07/15/23 1212 88     Resp 07/15/23 1212 18     Temp 07/15/23 1212 98.6 F (37 C)     Temp Source 07/15/23 1212 Oral     SpO2 07/15/23 1212 98 %     Weight 07/15/23 1211 146 lb (66.2 kg)     Height 07/15/23 1211 5\' 8"  (1.727 m)     Head Circumference --      Peak Flow --      Pain Score 07/15/23 1211 0     Pain Loc --      Pain Education --      Exclude from Growth Chart --     Most recent vital signs: Vitals:   07/15/23 1430 07/15/23 1450  BP: 101/66 101/60  Pulse: 72 77  Resp: (!) 22 13  Temp:    SpO2: 98% 100%    General: Awake, no distress.  CV:  Good peripheral perfusion.  Resp:  Normal effort.  Abd:  No distention.  Other:  Normal blood pressure, normal vital signs.   Appears well appears euvolemic nontoxic comfortable.  Clear lungs soft nontender abdomen.  Moving all extremities.  He has a steady gait   ED Results / Procedures / Treatments   Labs (all labs ordered are listed, but only abnormal results are displayed) Labs Reviewed  BASIC METABOLIC PANEL  CBC WITH DIFFERENTIAL/PLATELET  TROPONIN I (HIGH SENSITIVITY)  TROPONIN I (HIGH SENSITIVITY)     I ordered and reviewed the above labs they are notable for initial troponin is negative, cell counts and electrolytes within normal limits.  EKG  ED ECG REPORT I, Pilar Jarvis, the attending physician, personally viewed and interpreted this ECG.   Date: 07/15/2023  EKG Time: 1212  Rate: 83  Rhythm: nsr  Axis: nl  Intervals:none  ST&T Change: no stemi   PROCEDURES:  Critical Care performed: No  Procedures   MEDICATIONS ORDERED IN ED: Medications - No data to display   IMPRESSION / MDM / ASSESSMENT AND PLAN / ED COURSE  I reviewed the triage vital signs and the nursing notes.  Patient's presentation is most consistent with acute presentation with potential threat to life or bodily function.  Differential diagnosis includes, but is not limited to, dysrhythmia, electrolyte derangement, dehydration, sepsis, orthostatic lightheadedness   The patient is on the cardiac monitor to evaluate for evidence of arrhythmia and/or significant heart rate changes.  MDM:   Patient with palpitations orthostatic lightheadedness and transient hypotension this morning completely resolved and standing now from laying position with no recurrence of symptoms.  She denies any chest pain, doubt ACS, palpitations with normal EKG here and normal electrolytes no signs of malignant dysrhythmia.  She has no infectious symptoms and appears very well doubt sepsis or infection.  She has a cardiology appointment this week and she will follow-up and inquire about Holter monitoring.  She  will return with any new or worsening symptoms and is eager to be discharged from the hospital now.        FINAL CLINICAL IMPRESSION(S) / ED DIAGNOSES   Final diagnoses:  Palpitations  Orthostatic lightheadedness     Rx / DC Orders   ED Discharge Orders     None        Note:  This document was prepared using Dragon voice recognition software and may include unintentional dictation errors.    Pilar Jarvis, MD 07/15/23 [phone]

## 2023-07-15 NOTE — Telephone Encounter (Signed)
Pt in ER now for evaluation.

## 2023-07-15 NOTE — Discharge Instructions (Addendum)
Fortunately your testing in the emergency department did not show any signs of severe dehydration, or heart conduction problems.  Your symptoms resolved and your blood pressure was normal.  It is important that you follow-up with your heart doctor this week as scheduled and ask about a Holter monitor.  Thank you for choosing [place] for your health care today!  Please see your primary doctor this week for a follow up appointment.   If you have any new, worsening, or unexpected symptoms call your doctor right away or come back to the emergency department for reevaluation.  It was my pleasure to care for you today.   Daneil Dan Modesto Charon, MD

## 2023-07-17 ENCOUNTER — Encounter: Payer: Self-pay | Admitting: Medical

## 2023-07-17 ENCOUNTER — Ambulatory Visit: Payer: PPO | Attending: Medical | Admitting: Medical

## 2023-07-17 VITALS — BP 95/66 | HR 68 | Ht 68.5 in | Wt 144.6 lb

## 2023-07-17 DIAGNOSIS — I471 Supraventricular tachycardia, unspecified: Secondary | ICD-10-CM

## 2023-07-17 DIAGNOSIS — R002 Palpitations: Secondary | ICD-10-CM

## 2023-07-17 DIAGNOSIS — I9589 Other hypotension: Secondary | ICD-10-CM

## 2023-07-17 DIAGNOSIS — E782 Mixed hyperlipidemia: Secondary | ICD-10-CM | POA: Diagnosis not present

## 2023-07-17 MED ORDER — PROPRANOLOL HCL 20 MG PO TABS: 20.0000 mg | ORAL_TABLET | Freq: Every day | ORAL | 3 refills | Status: DC

## 2023-07-17 NOTE — Patient Instructions (Signed)
Medication Instructions:  Your physician has recommended you make the following change in your medication:   propranolol (INDERAL) 20 MG tablet - Take 1 tablet (20 mg total) by mouth daily  *If you need a refill on your cardiac medications before your next appointment, please call your pharmacy*  Lab Work: -None ordered  Testing/Procedures: -None ordered  Follow-Up: At [facility], you and your health needs are our priority.  As part of our continuing mission to provide you with exceptional heart care, we have created designated Provider Care Teams.  These Care Teams include your primary Cardiologist (physician) and Advanced Practice Providers (APPs -  Physician Assistants and Nurse Practitioners) who all work together to provide you with the care you need, when you need it.  Your next appointment:   1 year(s)  Provider:   Cadence Fransico Michael, PA-C    Other Instructions -None

## 2023-07-17 NOTE — Progress Notes (Signed)
Cardiology Office Note:    Date:  07/17/2023   ID:  Cindy Arnold, DOB 19-Jun-1951, MRN [id number]  PCP:  Dale [place], MD  [facility] Cardiologist:  Debbe Odea, MD  [facility] Electrophysiologist:  None   Referring MD: Dale Aviston, MD   Chief Complaint: 6 month follow-up  History of Present Illness:    Cindy Arnold is a 72 y.o. female with a hx of breast cancer s/p lumpectomy - chemo +XRT, ophthalmic migraines on propranolol who presents for follow-up.   Patient has been seen for palpitations in the past. Prior cardiac heart monitor in 2022 showed occasional paroxysmal SVT. She takes propranolol and symptoms are well-controlled.  She was last seen in December 2023 and was overall stable from a cardiac perspective.  She was seen in the ER 7/22 for hypotension. She felt her heart flutter and had some dizziness/lightheaded. She took her BP and it was 76/50. It was low on the second check and had orthostatic symptoms. She went to the ER when symptoms persisted. Labs in the ER were normal. EKG showed NSR with no significant changes. BP 101/77.  Today, recent ER visit for hypotension was reviewed. Today BP 95/66. Orthostatics negative. She overall feels normal. She denies lightheadedness or dizziness. No chest pain or SOB. She is eating and drinking normally. She walked a few days ago and felt normal. She feels palpitations are about the same.   Past Medical History:  Diagnosis Date   Basal cell carcinoma    multiple removed/[facility]   Breast cancer (HCC) 12/13   s/p right lumpectomy, s/p chemo and XRT   DES exposure in utero    Hepatitis A    History of chicken pox    Hyperactive airway disease    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HCC)    Migraines    Miscarriage    x3    Past Surgical History:  Procedure Laterality Date   ABDOMINAL HYSTERECTOMY     BASAL CELL CARCINOMA EXCISION     Multiple   BREAST BIOPSY  1975   Cyst   BREAST LUMPECTOMY  WITH AXILLARY LYMPH NODE DISSECTION  01/06/13, 01/22/13   Dr. Wendee Copp, [facility], second surgery for pos margin   CERVICAL FUSION  2001   COLONOSCOPY     COLONOSCOPY WITH PROPOFOL N/A 06/11/2022   Procedure: COLONOSCOPY WITH PROPOFOL;  Surgeon: Toney Reil, MD;  Location: [facility] ENDOSCOPY;  Service: Gastroenterology;  Laterality: N/A;   DILATION AND CURETTAGE OF UTERUS     x 3   LAPAROSCOPIC BILATERAL SALPINGO OOPHERECTOMY Bilateral 12/21/2020   Procedure: LAPAROSCOPIC BILATERAL SALPINGO OOPHORECTOMY;  Surgeon: Ward, Elenora Fender, MD;  Location: [facility] ORS;  Service: Gynecology;  Laterality: Bilateral;   VAGINAL DELIVERY     x4   WISDOM TOOTH EXTRACTION      Current Medications: Current Meds  Medication Sig   albuterol (VENTOLIN HFA) 108 (90 Base) MCG/ACT inhaler INHALE 2 PUFFS INTO THE LUNGS EVERY 6 HOURS AS NEEDED FOR WHEEZING OR SHORTNESS OF BREATH.   alendronate (FOSAMAX) 70 MG tablet Take 1 tablet (70 mg total) by mouth every 7 (seven) days. Take with a full glass of water on an empty stomach.   BIOTIN PO Take 10,000 mg by mouth daily.   butalbital-aspirin-caffeine (FIORINAL) 50-325-40 MG capsule Take 1 capsule by mouth daily as needed for headache.   Cholecalciferol (VITAMIN D3) 125 MCG (5000 UT) CAPS Take 5,000 Units by mouth daily.   EVENING PRIMROSE OIL PO Take 1,300 mg  by mouth.   Glucosamine-Chondroitin (MOVE FREE PO) Take 1 capsule by mouth daily. Cartilage/boron/hyalauronic acid   ipratropium (ATROVENT) 0.06 % nasal spray SPRAY TWICE INTO EACH NOSTRIL TWICE DAILY   LORazepam (ATIVAN) 0.5 MG tablet Take 1 tablet (0.5 mg total) by mouth daily as needed. for anxiety   MAGNESIUM BISGLYCINATE PO Take 200 mg by mouth daily.   Multiple Minerals-Vitamins (CALCIUM CITRATE PLUS/MAGNESIUM PO) Take 2 tablets by mouth 2 (two) times daily. CAL CITRATE 500-MAGNESIUM 80-ZINC-10   Omega-3 Fatty Acids (OMEGA-3 FISH OIL PO) Take 1,280 mg by mouth 2 (two) times daily.   pantoprazole (PROTONIX) 20  MG tablet TAKE ONE TABLET BY MOUTH TWICE DAILY BEFORE A MEAL   Riboflavin (VITAMIN B2 PO) Take 250 mg by mouth daily.   sertraline (ZOLOFT) 50 MG tablet Take 1 tablet (50 mg total) by mouth daily.   traZODone (DESYREL) 50 MG tablet TAKE 1 TABLET BY MOUTH NIGHTLY AS NEEDEDFOR SLEEP   [DISCONTINUED] propranolol (INDERAL) 40 MG tablet Take 1 tablet (40 mg total) by mouth daily.     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    Social Determinants of Health   Financial Resource Strain: Low Risk  (06/24/2023)   Overall Financial Resource Strain (CARDIA)    Difficulty of Paying Living Expenses: Not hard at all  Food Insecurity: No Food Insecurity (06/24/2023)   Hunger Vital Sign    Worried About Running Out of Food in the Last Year: Never true    Ran Out of Food in the Last Year: Never true  Transportation Needs: No Transportation Needs (06/24/2023)   PRAPARE - Administrator, Civil Service (Medical): No    Lack of Transportation (Non-Medical): No  Physical Activity: Insufficiently Active (06/24/2023)   Exercise Vital Sign    Days of Exercise per Week: 4 days    Minutes of Exercise per Session: 30 min  Stress: No Stress Concern Present (06/24/2023)   [employer] of Occupational Health - Occupational Stress  Questionnaire    Feeling of Stress : Not at all  Social Connections: Socially Integrated (06/24/2023)   Social Connection and Isolation Panel [NHANES]    Frequency of Communication with Friends and Family: More than three times a week    Frequency of Social Gatherings with Friends and Family: More than three times a week    Attends Religious Services: More than 4 times per year    Active Member of [employer] or Organizations: Yes    Attends Engineer, structural: More than 4 times per year    Marital Status: Married     Family History: The patient's family history includes Arthritis in her mother; Birth defects  in her maternal aunt and maternal aunt; Cancer in her mother; Cancer (age of onset: 57) in an other family member; Colon cancer in an other family member; Heart attack in her father; Heart disease (age of onset: 66) in her father; Hyperlipidemia in her father and mother; Hypertension in her father and mother; Pancreatic cancer in her mother; Stroke in her maternal grandmother.  ROS:   Please see the history of present illness.     All other systems reviewed and are negative.  EKGs/Labs/Other Studies Reviewed:    The following studies were reviewed today:  Heart monitor 02/2021  Patch Wear Time:  13 days and 21 hours (2022-03-04T11:16:03-499 to 2022-03-18T10:10:42-0400)   Patient had a min HR of 46 bpm, max HR of 182 bpm, and avg HR of 67 bpm. Predominant underlying rhythm was Sinus Rhythm.  Occasional paroxysmal SVT noted. Isolated SVEs were rare (<1.0%), SVE Couplets were rare (<1.0%), and SVE Triplets were rare (<1.0%).  EKG:  EKG is ordered today.  The ekg ordered today demonstrates NSR 68bpm, TW flattening aVL  Recent Labs: 06/10/2023: ALT 31; TSH 4.11 07/15/2023: BUN 17; Creatinine, Ser 0.68; Hemoglobin 13.9; Platelets 279; Potassium 3.6; Sodium 139  Recent Lipid Panel    Component Value Date/Time   CHOL 200 06/10/2023 0745   TRIG 66.0 06/10/2023 0745   HDL 70.00  06/10/2023 0745   CHOLHDL 3 06/10/2023 0745   VLDL 13.2 06/10/2023 0745   LDLCALC 117 (H) 06/10/2023 0745    Physical Exam:    VS:  BP 95/66 (BP Location: Left Arm, Patient Position: Sitting, Cuff Size: Normal)   Pulse 68   Ht 5' 8.5" (1.74 m)   Wt 144 lb 9.6 oz (65.6 kg)   SpO2 98%   BMI 21.67 kg/m     Wt Readings from Last 3 Encounters:  07/17/23 144 lb 9.6 oz (65.6 kg)  07/15/23 146 lb (66.2 kg)  06/24/23 144 lb (65.3 kg)     GEN:  Well nourished, well developed in no acute distress HEENT: Normal NECK: No JVD; No carotid bruits LYMPHATICS: No lymphadenopathy CARDIAC: RRR, no murmurs, rubs, gallops RESPIRATORY:  Clear to auscultation without rales, wheezing or rhonchi  ABDOMEN: Soft, non-tender, non-distended MUSCULOSKELETAL:  No edema; No deformity  SKIN: Warm and dry NEUROLOGIC:  Alert and oriented x 3 PSYCHIATRIC:  Normal affect   ASSESSMENT:    1. Other specified hypotension   2. Paroxysmal SVT (supraventricular tachycardia)   3. Palpitations   4. Hyperlipidemia, mixed    PLAN:    In order of problems listed above:  Hypotension Recent ER visit for hypotension with benign work-up. She takes propranolol 40mg  for palpitations. BP today 95/66. Orthostatics negative. She is overall feeling normal. No lightheadedness or dizziness.  She has been out walking and felt completely normal.  I will decrease propranolol to 20 mg daily.  She will continue to monitor blood pressure and symptoms at home.  pSVT/palpitations She reports controlled symptoms, no increase in palpitations. I will decrease propranolol as above.  She will continue to monitor symptoms at home.  HLD Diet controlled. Followed by PCP.   Disposition: Follow up in 1 year(s) with MD/APP    Signed, Laury Huizar David Stall, PA-C  07/17/2023 12:28 PM    [facility]

## 2023-07-24 ENCOUNTER — Other Ambulatory Visit: Payer: Self-pay | Admitting: Internal Medicine

## 2023-08-05 ENCOUNTER — Other Ambulatory Visit: Payer: Self-pay | Admitting: Internal Medicine

## 2023-08-05 DIAGNOSIS — Z1231 Encounter for screening mammogram for malignant neoplasm of breast: Secondary | ICD-10-CM

## 2023-08-07 ENCOUNTER — Other Ambulatory Visit: Payer: Self-pay | Admitting: Internal Medicine

## 2023-09-05 ENCOUNTER — Telehealth (INDEPENDENT_AMBULATORY_CARE_PROVIDER_SITE_OTHER): Payer: PPO | Admitting: Internal Medicine

## 2023-09-05 ENCOUNTER — Encounter: Payer: Self-pay | Admitting: Internal Medicine

## 2023-09-05 DIAGNOSIS — I471 Supraventricular tachycardia, unspecified: Secondary | ICD-10-CM | POA: Diagnosis not present

## 2023-09-05 DIAGNOSIS — R059 Cough, unspecified: Secondary | ICD-10-CM

## 2023-09-05 DIAGNOSIS — K219 Gastro-esophageal reflux disease without esophagitis: Secondary | ICD-10-CM | POA: Diagnosis not present

## 2023-09-05 MED ORDER — BENZONATATE 100 MG PO CAPS: 100.0000 mg | ORAL_CAPSULE | Freq: Three times a day (TID) | ORAL | 0 refills | Status: DC | PRN

## 2023-09-05 MED ORDER — AZITHROMYCIN 250 MG PO TABS: ORAL_TABLET | ORAL | 0 refills | Status: AC

## 2023-09-05 NOTE — Progress Notes (Signed)
[name], female   DOB: 10-29-1951, 71 y.o.   MRN: [id number]   Virtual Visit via video Note  I connected with Cindy Arnold by a video enabled telemedicine application and verified that I am speaking with the correct person using two identifiers. Location patient: home Location provider: work  Persons participating in the virtual visit: patient, provider  The limitations, risks, security and privacy concerns of performing an evaluation and management service by video and the availability of in person appointments have been discussed.  It has also been discussed with the patient that there may be a patient responsible charge related to this service. The patient expressed understanding and agreed to proceed.   Reason for visit: work in appt  HPI: Work in - persistent cough and congestion. Husband has been sick.  States - starting two weeks ago - deep cough.  Occasional stuffiness.  Taking mucinex DM.  Has albuterol inhaler to use prn.  No fever.  Used neti pot.  With increased cough - will notice occasional light headedness.  No increased sob.  Can hear some congestion in her chest - at times.  No chest pain.  No acid reflux.  No diarrhea reported.  Discussed treatment.  She is feeling some better today.     ROS: See pertinent positives and negatives per HPI.  Past Medical History:  Diagnosis Date   Basal cell carcinoma    [facility]   Breast cancer (HCC) 12/13   s/p right lumpectomy, s/p chemo and XRT   DES exposure in utero    Hepatitis A    History of chicken pox    Hyperactive airway disease    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HCC)    Migraines    Miscarriage    x3    Past Surgical History:  Procedure Laterality Date   ABDOMINAL HYSTERECTOMY     BASAL CELL CARCINOMA EXCISION     Multiple   BREAST BIOPSY  1975   Cyst   BREAST LUMPECTOMY WITH AXILLARY LYMPH NODE DISSECTION  01/06/13, 01/22/13   Dr. Wendee Copp, [facility], second surgery for  pos margin   CERVICAL FUSION  2001   COLONOSCOPY     COLONOSCOPY WITH PROPOFOL N/A 06/11/2022   Procedure: COLONOSCOPY WITH PROPOFOL;  Surgeon: Toney Reil, MD;  Location: [facility] ENDOSCOPY;  Service: Gastroenterology;  Laterality: N/A;   DILATION AND CURETTAGE OF UTERUS     x 3   LAPAROSCOPIC BILATERAL SALPINGO OOPHERECTOMY Bilateral 12/21/2020   Procedure: LAPAROSCOPIC BILATERAL SALPINGO OOPHORECTOMY;  Surgeon: Ward, Elenora Fender, MD;  Location: [facility] ORS;  Service: Gynecology;  Laterality: Bilateral;   VAGINAL DELIVERY     x4   WISDOM TOOTH EXTRACTION      Family History  Problem Relation Age of Onset   Pancreatic cancer Mother    Cancer Mother        Pancreatic   Arthritis Mother    Hyperlipidemia Mother    Hypertension Mother    Heart attack Father    Hyperlipidemia Father    Hypertension Father    Heart disease Father 23   Colon cancer Other    Cancer Other 50       Breast and Ovarian- dx 50's/Colon   Birth defects Maternal Aunt        Breast - in 33's   Stroke Maternal Grandmother    Birth defects Maternal Aunt        Breast - in 51's    SOCIAL HX: reviewed.  Current Outpatient Medications:    azithromycin (ZITHROMAX) 250 MG tablet, Take 2 tablets on day 1, then 1 tablet daily on days 2 through 5, Disp: 6 tablet, Rfl: 0   benzonatate (TESSALON PERLES) 100 MG capsule, Take 1 capsule (100 mg total) by mouth 3 (three) times daily as needed for cough., Disp: 21 capsule, Rfl: 0   albuterol (VENTOLIN HFA) 108 (90 Base) MCG/ACT inhaler, INHALE 2 PUFFS INTO THE LUNGS EVERY 6 HOURS AS NEEDED FOR WHEEZING OR SHORTNESS OF BREATH., Disp: 18 g, Rfl: 1   alendronate (FOSAMAX) 70 MG tablet, Take 1 tablet (70 mg total) by mouth every 7 (seven) days. Take with a full glass of water on an empty stomach., Disp: 12 tablet, Rfl: 3   BIOTIN PO, Take 10,000 mg by mouth daily., Disp: , Rfl:    butalbital-aspirin-caffeine (FIORINAL) 50-325-40 MG capsule, Take 1 capsule by mouth daily as  needed for headache., Disp: 30 capsule, Rfl: 0   Cholecalciferol (VITAMIN D3) 125 MCG (5000 UT) CAPS, Take 5,000 Units by mouth daily., Disp: , Rfl:    EVENING PRIMROSE OIL PO, Take 1,300 mg by mouth., Disp: , Rfl:    Glucosamine-Chondroitin (MOVE FREE PO), Take 1 capsule by mouth daily. Cartilage/boron/hyalauronic acid, Disp: , Rfl:    ipratropium (ATROVENT) 0.06 % nasal spray, SPRAY TWICE INTO EACH NOSTRIL TWICE DAILY, Disp: 30 mL, Rfl: 2   LORazepam (ATIVAN) 0.5 MG tablet, Take 1 tablet (0.5 mg total) by mouth daily as needed. for anxiety, Disp: 30 tablet, Rfl: 0   MAGNESIUM BISGLYCINATE PO, Take 200 mg by mouth daily., Disp: , Rfl:    Multiple Minerals-Vitamins (CALCIUM CITRATE PLUS/MAGNESIUM PO), Take 2 tablets by mouth 2 (two) times daily. CAL CITRATE 500-MAGNESIUM 80-ZINC-10, Disp: , Rfl:    Omega-3 Fatty Acids (OMEGA-3 FISH OIL PO), Take 1,280 mg by mouth 2 (two) times daily., Disp: , Rfl:    pantoprazole (PROTONIX) 20 MG tablet, TAKE ONE TABLET BY MOUTH TWICE DAILY BEFORE A MEAL, Disp: 180 tablet, Rfl: 3   propranolol (INDERAL) 20 MG tablet, Take 1 tablet (20 mg total) by mouth daily., Disp: 90 tablet, Rfl: 3   Riboflavin (VITAMIN B2 PO), Take 250 mg by mouth daily., Disp: , Rfl:    sertraline (ZOLOFT) 50 MG tablet, Take 1 tablet (50 mg total) by mouth daily., Disp: 90 tablet, Rfl: 1   traZODone (DESYREL) 50 MG tablet, TAKE 1 TABLET BY MOUTH NIGHTLY AS NEEDEDFOR SLEEP., Disp: 30 tablet, Rfl: 1  EXAM:  GENERAL: alert, oriented, appears well and in no acute distress  HEENT: atraumatic, conjunttiva clear, no obvious abnormalities on inspection of external nose and ears  NECK: normal movements of the head and neck  LUNGS: on inspection no signs of respiratory distress, breathing rate appears normal, no obvious gross SOB, gasping or wheezing  CV: no obvious cyanosis  PSYCH/NEURO: pleasant and cooperative, no obvious depression or anxiety, speech and thought processing grossly  intact  ASSESSMENT AND PLAN:  Discussed the following assessment and plan:  Problem List Items Addressed This Visit     Paroxysmal SVT (supraventricular tachycardia)    Continue propranolol.       GERD (gastroesophageal reflux disease)    No upper symptoms reported. On protonix.       Cough - Primary    Persistent cough and congestion as outlined.  Is starting to feel some better.  Symptoms present for two weeks.  Husband similar symptoms.  Husband - negative covid test.  Continue mucinex DM.  Continue saline flushes.  Albuterol prn.  Zpak as directed. Given her eye issues, hold on prednisone. Tessalon perles - prn cough. Follow.  Discussed if persistent, will need cxr. Call with update.        Return if symptoms worsen or fail to improve.   I discussed the assessment and treatment plan with the patient. The patient was provided an opportunity to ask questions and all were answered. The patient agreed with the plan and demonstrated an understanding of the instructions.   The patient was advised to call back or seek an in-person evaluation if the symptoms worsen or if the condition fails to improve as anticipated.    Dale Biron, MD

## 2023-09-05 NOTE — Telephone Encounter (Signed)
Fyi for you- spoke with patient. She has had a persistent cough for 2 weeks. Non productive. Does not feel bad. No sob. She says she feels like there is a rattle in her chest. I have scheduled her for a virtual to discuss with you this PM and she is agreeable to [facility] if [facility].

## 2023-09-07 ENCOUNTER — Encounter: Payer: Self-pay | Admitting: Internal Medicine

## 2023-09-07 NOTE — Assessment & Plan Note (Addendum)
Persistent cough and congestion as outlined.  Is starting to feel some better.  Symptoms present for two weeks.  Husband similar symptoms.  Husband - negative covid test.  Continue mucinex DM.  Continue saline flushes.  Albuterol prn.  Zpak as directed. Given her eye issues, hold on prednisone. Tessalon perles - prn cough. Follow.  Discussed if persistent, will need cxr. Call with update.

## 2023-09-07 NOTE — Assessment & Plan Note (Signed)
No upper symptoms reported.  On protonix.

## 2023-09-07 NOTE — Assessment & Plan Note (Signed)
-  Continue propranolol

## 2023-09-23 DIAGNOSIS — H35052 Retinal neovascularization, unspecified, left eye: Secondary | ICD-10-CM | POA: Diagnosis not present

## 2023-09-26 DIAGNOSIS — L308 Other specified dermatitis: Secondary | ICD-10-CM | POA: Diagnosis not present

## 2023-09-26 DIAGNOSIS — D2272 Melanocytic nevi of left lower limb, including hip: Secondary | ICD-10-CM | POA: Diagnosis not present

## 2023-09-26 DIAGNOSIS — D2271 Melanocytic nevi of right lower limb, including hip: Secondary | ICD-10-CM | POA: Diagnosis not present

## 2023-09-26 DIAGNOSIS — Z86006 Personal history of melanoma in-situ: Secondary | ICD-10-CM | POA: Diagnosis not present

## 2023-09-26 DIAGNOSIS — L570 Actinic keratosis: Secondary | ICD-10-CM | POA: Diagnosis not present

## 2023-09-26 DIAGNOSIS — D044 Carcinoma in situ of skin of scalp and neck: Secondary | ICD-10-CM | POA: Diagnosis not present

## 2023-09-26 DIAGNOSIS — D225 Melanocytic nevi of trunk: Secondary | ICD-10-CM | POA: Diagnosis not present

## 2023-09-26 DIAGNOSIS — Z85820 Personal history of malignant melanoma of skin: Secondary | ICD-10-CM | POA: Diagnosis not present

## 2023-09-26 DIAGNOSIS — D2261 Melanocytic nevi of right upper limb, including shoulder: Secondary | ICD-10-CM | POA: Diagnosis not present

## 2023-09-26 DIAGNOSIS — D2262 Melanocytic nevi of left upper limb, including shoulder: Secondary | ICD-10-CM | POA: Diagnosis not present

## 2023-09-26 DIAGNOSIS — Z85828 Personal history of other malignant neoplasm of skin: Secondary | ICD-10-CM | POA: Diagnosis not present

## 2023-09-26 DIAGNOSIS — L821 Other seborrheic keratosis: Secondary | ICD-10-CM | POA: Diagnosis not present

## 2023-09-26 DIAGNOSIS — D485 Neoplasm of uncertain behavior of skin: Secondary | ICD-10-CM | POA: Diagnosis not present

## 2023-10-01 DIAGNOSIS — H40033 Anatomical narrow angle, bilateral: Secondary | ICD-10-CM | POA: Diagnosis not present

## 2023-10-21 ENCOUNTER — Other Ambulatory Visit (INDEPENDENT_AMBULATORY_CARE_PROVIDER_SITE_OTHER): Payer: PPO

## 2023-10-21 DIAGNOSIS — E7800 Pure hypercholesterolemia, unspecified: Secondary | ICD-10-CM | POA: Diagnosis not present

## 2023-10-21 LAB — BASIC METABOLIC PANEL
BUN: 15 mg/dL (ref 6–23)
CO2: 31 meq/L (ref 19–32)
Calcium: 9.6 mg/dL (ref 8.4–10.5)
Chloride: 102 meq/L (ref 96–112)
Creatinine, Ser: 0.82 mg/dL (ref 0.40–1.20)
GFR: 71.96 mL/min (ref >60.00)
Glucose, Bld: 94 mg/dL (ref 70–99)
Potassium: 4 meq/L (ref 3.5–5.1)
Sodium: 140 meq/L (ref 135–145)

## 2023-10-22 LAB — LIPID PANEL
Cholesterol: 199 mg/dL (ref 0–200)
HDL: 75.6 mg/dL (ref >39.00)
LDL Cholesterol: 109 mg/dL — ABNORMAL HIGH (ref 0–99)
NonHDL: 123.53
Total CHOL/HDL Ratio: 3
Triglycerides: 72 mg/dL (ref 0.0–149.0)
VLDL: 14.4 mg/dL (ref 0.0–40.0)

## 2023-10-22 LAB — HEPATIC FUNCTION PANEL
ALT: 22 U/L (ref 0–35)
AST: 25 U/L (ref 0–37)
Albumin: 4.3 g/dL (ref 3.5–5.2)
Alkaline Phosphatase: 53 U/L (ref 39–117)
Bilirubin, Direct: 0.1 mg/dL (ref 0.0–0.3)
Total Bilirubin: 0.6 mg/dL (ref 0.2–1.2)
Total Protein: 6.9 g/dL (ref 6.0–8.3)

## 2023-10-23 ENCOUNTER — Ambulatory Visit: Payer: PPO | Admitting: Internal Medicine

## 2023-10-23 ENCOUNTER — Encounter: Payer: Self-pay | Admitting: Internal Medicine

## 2023-10-23 VITALS — BP 112/68 | HR 78 | Temp 97.9°F | Resp 16 | Ht 68.0 in | Wt 149.0 lb

## 2023-10-23 DIAGNOSIS — J4520 Mild intermittent asthma, uncomplicated: Secondary | ICD-10-CM | POA: Diagnosis not present

## 2023-10-23 DIAGNOSIS — R7989 Other specified abnormal findings of blood chemistry: Secondary | ICD-10-CM | POA: Diagnosis not present

## 2023-10-23 DIAGNOSIS — Z853 Personal history of malignant neoplasm of breast: Secondary | ICD-10-CM | POA: Diagnosis not present

## 2023-10-23 DIAGNOSIS — K219 Gastro-esophageal reflux disease without esophagitis: Secondary | ICD-10-CM | POA: Diagnosis not present

## 2023-10-23 DIAGNOSIS — F419 Anxiety disorder, unspecified: Secondary | ICD-10-CM | POA: Diagnosis not present

## 2023-10-23 DIAGNOSIS — E7800 Pure hypercholesterolemia, unspecified: Secondary | ICD-10-CM

## 2023-10-23 DIAGNOSIS — G629 Polyneuropathy, unspecified: Secondary | ICD-10-CM

## 2023-10-23 DIAGNOSIS — Z23 Encounter for immunization: Secondary | ICD-10-CM

## 2023-10-23 DIAGNOSIS — I471 Supraventricular tachycardia, unspecified: Secondary | ICD-10-CM

## 2023-10-23 DIAGNOSIS — M7989 Other specified soft tissue disorders: Secondary | ICD-10-CM | POA: Diagnosis not present

## 2023-10-27 ENCOUNTER — Encounter: Payer: Self-pay | Admitting: Internal Medicine

## 2023-10-27 DIAGNOSIS — M7989 Other specified soft tissue disorders: Secondary | ICD-10-CM | POA: Insufficient documentation

## 2023-10-27 NOTE — Assessment & Plan Note (Signed)
Palpable fullness - soft tissue - left - neck.  Obtain CT neck to further evaluate.

## 2023-10-27 NOTE — Assessment & Plan Note (Signed)
No upper symptoms reported.  On protonix.   

## 2023-10-27 NOTE — Assessment & Plan Note (Signed)
The 10-year ASCVD risk score (Arnett DK, et al., 2019) is: 7.8%   Values used to calculate the score:     Age: 72 years     Sex: Female     Is Non-Hispanic African American: No     Diabetic: No     Tobacco smoker: No     Systolic Blood Pressure: 112 mmHg     Is BP treated: No     HDL Cholesterol: 75.6 mg/dL     Total Cholesterol: 199 mg/dL  Low cholesterol diet and exercise. Follow lipid panel.

## 2023-10-27 NOTE — Assessment & Plan Note (Signed)
Recent elevation in ALT, AST and alk phos.  Ultrasound - no acute findings.  Small liver cyst. Discussed any new medications/supplements.  No increased alcohol.  She cut out supplements.  Recent liver panel wnl.  Saw GI. Follow liver function tests.

## 2023-10-27 NOTE — Assessment & Plan Note (Signed)
Mammogram 01/30/23 - Birads II.  

## 2023-10-27 NOTE — Assessment & Plan Note (Signed)
NCS c/w polyneuropathy.  Appears to be stable.  Follow. Last B12 level wnl.

## 2023-10-27 NOTE — Assessment & Plan Note (Signed)
Breathing stable. No increased sob reported.

## 2023-10-27 NOTE — Assessment & Plan Note (Signed)
Continue propranolol. On lower dose now - 20mg .  Doing well on this dose.  Follow.

## 2023-10-27 NOTE — Assessment & Plan Note (Signed)
Overall appears to be doing well.  On zoloft. Follow.  

## 2023-10-28 DIAGNOSIS — L905 Scar conditions and fibrosis of skin: Secondary | ICD-10-CM | POA: Diagnosis not present

## 2023-10-28 DIAGNOSIS — D044 Carcinoma in situ of skin of scalp and neck: Secondary | ICD-10-CM | POA: Diagnosis not present

## 2023-11-05 ENCOUNTER — Other Ambulatory Visit: Payer: Self-pay | Admitting: Internal Medicine

## 2023-11-18 ENCOUNTER — Ambulatory Visit
Admission: RE | Admit: 2023-11-18 | Discharge: 2023-11-18 | Disposition: A | Discharge status: Home / Self Care | Payer: PPO | Source: Ambulatory Visit | Attending: Internal Medicine | Admitting: Internal Medicine

## 2023-11-18 DIAGNOSIS — R221 Localized swelling, mass and lump, neck: Secondary | ICD-10-CM | POA: Diagnosis not present

## 2023-11-18 DIAGNOSIS — M7989 Other specified soft tissue disorders: Secondary | ICD-10-CM | POA: Diagnosis not present

## 2023-11-18 DIAGNOSIS — C50919 Malignant neoplasm of unspecified site of unspecified female breast: Secondary | ICD-10-CM | POA: Diagnosis not present

## 2023-11-18 DIAGNOSIS — C439 Malignant melanoma of skin, unspecified: Secondary | ICD-10-CM | POA: Diagnosis not present

## 2023-11-18 MED ORDER — SODIUM CHLORIDE 0.9 % IV BOLUS
50.0000 mL | Freq: Once | INTRAVENOUS | Status: AC
  Administered 2023-11-18: 50 mL via INTRAVENOUS

## 2023-11-18 MED ORDER — IOHEXOL 300 MG/ML  SOLN
75.0000 mL | Freq: Once | INTRAMUSCULAR | Status: AC | PRN
  Administered 2023-11-18: 75 mL via INTRAVENOUS

## 2023-11-20 ENCOUNTER — Other Ambulatory Visit: Payer: Self-pay | Admitting: Internal Medicine

## 2023-12-13 DIAGNOSIS — H3589 Other specified retinal disorders: Secondary | ICD-10-CM | POA: Diagnosis not present

## 2023-12-13 DIAGNOSIS — H5203 Hypermetropia, bilateral: Secondary | ICD-10-CM | POA: Diagnosis not present

## 2023-12-13 DIAGNOSIS — H2513 Age-related nuclear cataract, bilateral: Secondary | ICD-10-CM | POA: Diagnosis not present

## 2023-12-13 DIAGNOSIS — H35711 Central serous chorioretinopathy, right eye: Secondary | ICD-10-CM | POA: Diagnosis not present

## 2023-12-23 DIAGNOSIS — H35052 Retinal neovascularization, unspecified, left eye: Secondary | ICD-10-CM | POA: Diagnosis not present

## 2023-12-23 DIAGNOSIS — H3589 Other specified retinal disorders: Secondary | ICD-10-CM | POA: Diagnosis not present

## 2024-01-07 ENCOUNTER — Encounter: Payer: Self-pay | Admitting: Internal Medicine

## 2024-01-08 MED ORDER — LORAZEPAM 0.5 MG PO TABS: 0.5000 mg | ORAL_TABLET | Freq: Every day | ORAL | 0 refills | Status: AC | PRN

## 2024-01-08 MED ORDER — BUTALBITAL-ASPIRIN-CAFFEINE 50-325-40 MG PO CAPS: 1.0000 | ORAL_CAPSULE | Freq: Every day | ORAL | 0 refills | Status: DC | PRN

## 2024-01-08 NOTE — Telephone Encounter (Signed)
 Spoke to [employer] about her mother-n-law's recent passing. Will notify me if needs anything.  Did request refill lorazepam  and fiorinal .

## 2024-01-08 NOTE — Telephone Encounter (Signed)
 Called and talked to Cindy Arnold. Thanked her for the update and advised her to let us  know if they need anything.

## 2024-01-28 ENCOUNTER — Other Ambulatory Visit: Payer: Self-pay | Admitting: Internal Medicine

## 2024-02-04 ENCOUNTER — Ambulatory Visit
Admission: RE | Admit: 2024-02-04 | Discharge: 2024-02-04 | Disposition: A | Discharge status: Home / Self Care | Payer: PPO | Source: Ambulatory Visit | Attending: Internal Medicine | Admitting: Internal Medicine

## 2024-02-04 DIAGNOSIS — Z1231 Encounter for screening mammogram for malignant neoplasm of breast: Secondary | ICD-10-CM | POA: Insufficient documentation

## 2024-02-06 ENCOUNTER — Other Ambulatory Visit (HOSPITAL_COMMUNITY): Payer: Self-pay

## 2024-02-06 ENCOUNTER — Encounter: Payer: Self-pay | Admitting: Pharmacy Technician

## 2024-02-06 ENCOUNTER — Telehealth: Payer: Self-pay | Admitting: Pharmacy Technician

## 2024-02-06 MED ORDER — BUTALBITAL-ASPIRIN-CAFFEINE 50-325-40 MG PO TABS: 1.0000 | ORAL_TABLET | Freq: Every day | ORAL | 0 refills | Status: AC | PRN

## 2024-02-06 NOTE — Telephone Encounter (Signed)
Called patient to see if there is any reason why she is getting the fiorinal capsules vs tablets. She does not know of any reason that she cannot take the tablets.

## 2024-02-06 NOTE — Telephone Encounter (Signed)
Pharmacy Patient Advocate Encounter   Received notification from CoverMyMeds that prior authorization for Butalbital-Aspirin-Caffeine 50-325-40MG  CAPSULES is required/requested.   Insurance verification completed.   The patient is insured through [facility] ADVANTAGE/RX ADVANCE .   Per test claim:  Butalbital-Aspirin-Caffeine 50-325-40MG  TABLETS is preferred by the insurance.  If suggested medication is appropriate, Please send in a new RX and discontinue this one. If not, please advise as to why it's not appropriate so that we may request a Prior Authorization. Please note, some preferred medications may still require a PA.  If the suggested medications have not been trialed and there are no contraindications to their use, the PA will not be submitted, as it will not be approved.

## 2024-02-06 NOTE — Telephone Encounter (Signed)
Takes this for migraines.

## 2024-02-06 NOTE — Telephone Encounter (Signed)
error 

## 2024-02-06 NOTE — Telephone Encounter (Signed)
Rx ok'd and sent to pharmacy

## 2024-02-07 ENCOUNTER — Other Ambulatory Visit (HOSPITAL_COMMUNITY): Payer: Self-pay

## 2024-02-07 ENCOUNTER — Other Ambulatory Visit: Payer: Self-pay | Admitting: *Deleted

## 2024-02-07 ENCOUNTER — Inpatient Hospital Stay
Admission: RE | Admit: 2024-02-07 | Discharge: 2024-02-07 | Disposition: A | Discharge status: Home / Self Care | Payer: Self-pay | Source: Ambulatory Visit | Attending: Internal Medicine | Admitting: Internal Medicine

## 2024-02-07 DIAGNOSIS — Z1231 Encounter for screening mammogram for malignant neoplasm of breast: Secondary | ICD-10-CM

## 2024-02-16 ENCOUNTER — Encounter: Payer: Self-pay | Admitting: Internal Medicine

## 2024-02-17 ENCOUNTER — Telehealth (INDEPENDENT_AMBULATORY_CARE_PROVIDER_SITE_OTHER): Payer: PPO | Admitting: Internal Medicine

## 2024-02-17 ENCOUNTER — Other Ambulatory Visit: Payer: Self-pay | Admitting: Internal Medicine

## 2024-02-17 ENCOUNTER — Encounter: Payer: Self-pay | Admitting: Internal Medicine

## 2024-02-17 VITALS — Ht 68.0 in | Wt 149.0 lb

## 2024-02-17 DIAGNOSIS — R3915 Urgency of urination: Secondary | ICD-10-CM

## 2024-02-17 MED ORDER — CEPHALEXIN 500 MG PO CAPS
500.0000 mg | ORAL_CAPSULE | Freq: Two times a day (BID) | ORAL | 0 refills | Status: DC
Start: 2024-02-17 — End: 2024-03-08

## 2024-02-17 NOTE — Progress Notes (Signed)
 Virtual Visit via Video Note  I connected with Lillie Portner on 02/17/24 at 11:40 AM EST by a video enabled telemedicine application and verified that I am speaking with the correct person using two identifiers.  Location patient: home Location provider:work or home office Persons participating in the virtual visit: patient, provider  I discussed the limitations of evaluation and management by telemedicine and the availability of in person appointments. The patient expressed understanding and agreed to proceed.   HPI:  Patient presents today via video visit for possible urinary tract infection.  Patient states that urinary urgency beginning last Friday.  Patient states that she goes to the bathroom secondary to increased urgency.  However, she only has small amounts of urine each time.  No fevers or chills.  No flank pain.  She did complain of mild dysuria beginning on Saturday.  She has been taking Azo for the last 2 days.  She checked at home UTI test which was positive for leukocytes and negative for nitrites   ROS: See pertinent positives and negatives per HPI.  Past Medical History:  Diagnosis Date   Basal cell carcinoma    multiple removed/[facility]   Breast cancer (HCC) 12/13   s/p right lumpectomy, s/p chemo and XRT   DES exposure in utero    Hepatitis A    History of chicken pox    Hyperactive airway disease    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HCC)    Migraines    Miscarriage    x3    Past Surgical History:  Procedure Laterality Date   ABDOMINAL HYSTERECTOMY     BASAL CELL CARCINOMA EXCISION     Multiple   BREAST BIOPSY  1975   Cyst   BREAST LUMPECTOMY WITH AXILLARY LYMPH NODE DISSECTION  01/06/13, 01/22/13   Dr. Wendee Copp, [facility], second surgery for pos margin   CERVICAL FUSION  2001   COLONOSCOPY     COLONOSCOPY WITH PROPOFOL N/A 06/11/2022   Procedure: COLONOSCOPY WITH PROPOFOL;  Surgeon: Toney Reil, MD;  Location: [facility] ENDOSCOPY;  Service:  Gastroenterology;  Laterality: N/A;   DILATION AND CURETTAGE OF UTERUS     x 3   LAPAROSCOPIC BILATERAL SALPINGO OOPHERECTOMY Bilateral 12/21/2020   Procedure: LAPAROSCOPIC BILATERAL SALPINGO OOPHORECTOMY;  Surgeon: Ward, Elenora Fender, MD;  Location: [facility] ORS;  Service: Gynecology;  Laterality: Bilateral;   VAGINAL DELIVERY     x4   WISDOM TOOTH EXTRACTION      Family History  Problem Relation Age of Onset   Pancreatic cancer Mother    Cancer Mother        Pancreatic   Arthritis Mother    Hyperlipidemia Mother    Hypertension Mother    Heart attack Father    Hyperlipidemia Father    Hypertension Father    Heart disease Father 81   Breast cancer Maternal Aunt    Birth defects Maternal Aunt        Breast - in 68's   Breast cancer Maternal Aunt    Birth defects Maternal Aunt        Breast - in 29's   Stroke Maternal Grandmother    Colon cancer Other    Cancer Other 50       Breast and Ovarian- dx 50's/Colon      Current Outpatient Medications:    albuterol (VENTOLIN HFA) 108 (90 Base) MCG/ACT inhaler, INHALE 2 PUFFS INTO THE LUNGS EVERY 6 HOURS AS NEEDED FOR WHEEZING OR SHORTNESS OF BREATH., Disp: 18 g,  Rfl: 1   alendronate (FOSAMAX) 70 MG tablet, Take 1 tablet (70 mg total) by mouth every 7 (seven) days. Take with a full glass of water on an empty stomach., Disp: 12 tablet, Rfl: 3   BIOTIN PO, Take 10,000 mg by mouth daily., Disp: , Rfl:    butalbital-aspirin-caffeine (FIORINAL) 50-325-40 MG tablet, Take 1 tablet by mouth daily as needed for headache., Disp: 30 tablet, Rfl: 0   Cholecalciferol (VITAMIN D3) 125 MCG (5000 UT) CAPS, Take 5,000 Units by mouth daily., Disp: , Rfl:    EVENING PRIMROSE OIL PO, Take 1,300 mg by mouth., Disp: , Rfl:    Glucosamine-Chondroitin (MOVE FREE PO), Take 1 capsule by mouth daily. Cartilage/boron/hyalauronic acid, Disp: , Rfl:    ipratropium (ATROVENT) 0.06 % nasal spray, SPRAY TWICE INTO EACH NOSTRIL TWICE DAILY, Disp: 30 mL, Rfl: 2    LORazepam (ATIVAN) 0.5 MG tablet, Take 1 tablet (0.5 mg total) by mouth daily as needed. for anxiety, Disp: 30 tablet, Rfl: 0   MAGNESIUM BISGLYCINATE PO, Take 200 mg by mouth daily., Disp: , Rfl:    Multiple Minerals-Vitamins (CALCIUM CITRATE PLUS/MAGNESIUM PO), Take 2 tablets by mouth 2 (two) times daily. CAL CITRATE 500-MAGNESIUM 80-ZINC-10, Disp: , Rfl:    Omega-3 Fatty Acids (OMEGA-3 FISH OIL PO), Take 1,280 mg by mouth 2 (two) times daily., Disp: , Rfl:    pantoprazole (PROTONIX) 20 MG tablet, TAKE ONE TABLET BY MOUTH TWICE DAILY BEFORE A MEAL, Disp: 180 tablet, Rfl: 3   propranolol (INDERAL) 20 MG tablet, Take 1 tablet (20 mg total) by mouth daily., Disp: 90 tablet, Rfl: 3   Riboflavin (VITAMIN B2 PO), Take 250 mg by mouth daily., Disp: , Rfl:    sertraline (ZOLOFT) 50 MG tablet, TAKE 1 TABLET BY MOUTH DAILY., Disp: 90 tablet, Rfl: 1   traZODone (DESYREL) 50 MG tablet, TAKE 1 TABLET BY MOUTH NIGHTLY AS NEEDEDFOR SLEEP., Disp: 30 tablet, Rfl: 1  EXAM:  VITALS per patient if applicable:  GENERAL: alert, oriented, appears well and in no acute distress  HEENT: atraumatic, conjunttiva clear, no obvious abnormalities on inspection of external nose and ears  NECK: normal movements of the head and neck  LUNGS: on inspection no signs of respiratory distress, breathing rate appears normal, no obvious gross SOB, gasping or wheezing  CV: no obvious cyanosis  MS: moves all visible extremities without noticeable abnormality  PSYCH/NEURO: pleasant and cooperative, no obvious depression or anxiety, speech and thought processing grossly intact  ASSESSMENT AND PLAN:  Discussed the following assessment and plan:  No diagnosis found.     I discussed the assessment and treatment plan with the patient. The patient was provided an opportunity to ask questions and all were answered. The patient agreed with the plan and demonstrated an understanding of the instructions.   The patient was advised  to call back or seek an in-person evaluation if the symptoms worsen or if the condition fails to improve as anticipated.    Earl Lagos, MD

## 2024-02-17 NOTE — Telephone Encounter (Signed)
 Evaluated by Dr Heide Spark today. Per note, treated with keflex.

## 2024-02-17 NOTE — Assessment & Plan Note (Signed)
-  Patient states that she developed urinary urgency 3 days ago and developed dysuria 2 days ago.  No nausea or vomiting.  No fevers or chills.  No flank pain. -She did start taking Azo 2 days ago but still has persistent urinary urgency and mild dysuria -She checked a home test which is positive for leukocytes and negative nitrates -I am concerned for a possible urinary tract infection and patient is traveling on Wednesday -Will go ahead and treat her for possible urinary tract infection with Keflex 500 mg twice daily -Patient encouraged to stay hydrated and continue with the Azo -No further workup at this time

## 2024-02-17 NOTE — Telephone Encounter (Signed)
 Pt scheduled with Dr Heide Spark

## 2024-02-25 ENCOUNTER — Other Ambulatory Visit (INDEPENDENT_AMBULATORY_CARE_PROVIDER_SITE_OTHER): Payer: PPO

## 2024-02-25 DIAGNOSIS — E7800 Pure hypercholesterolemia, unspecified: Secondary | ICD-10-CM

## 2024-02-25 LAB — HEPATIC FUNCTION PANEL
ALT: 178 U/L — ABNORMAL HIGH (ref 0–35)
AST: 118 U/L — ABNORMAL HIGH (ref 0–37)
Albumin: 4.3 g/dL (ref 3.5–5.2)
Alkaline Phosphatase: 60 U/L (ref 39–117)
Bilirubin, Direct: 0.1 mg/dL (ref 0.0–0.3)
Total Bilirubin: 0.5 mg/dL (ref 0.2–1.2)
Total Protein: 7 g/dL (ref 6.0–8.3)

## 2024-02-25 LAB — BASIC METABOLIC PANEL
BUN: 17 mg/dL (ref 6–23)
CO2: 30 meq/L (ref 19–32)
Calcium: 9.4 mg/dL (ref 8.4–10.5)
Chloride: 102 meq/L (ref 96–112)
Creatinine, Ser: 0.71 mg/dL (ref 0.40–1.20)
GFR: 85.33 mL/min (ref >60.00)
Glucose, Bld: 96 mg/dL (ref 70–99)
Potassium: 3.7 meq/L (ref 3.5–5.1)
Sodium: 140 meq/L (ref 135–145)

## 2024-02-25 LAB — LIPID PANEL
Cholesterol: 191 mg/dL (ref 0–200)
HDL: 69.3 mg/dL (ref >39.00)
LDL Cholesterol: 108 mg/dL — ABNORMAL HIGH (ref 0–99)
NonHDL: 121.61
Total CHOL/HDL Ratio: 3
Triglycerides: 67 mg/dL (ref 0.0–149.0)
VLDL: 13.4 mg/dL (ref 0.0–40.0)

## 2024-02-27 ENCOUNTER — Ambulatory Visit (INDEPENDENT_AMBULATORY_CARE_PROVIDER_SITE_OTHER): Payer: PPO | Admitting: Internal Medicine

## 2024-02-27 ENCOUNTER — Encounter: Payer: Self-pay | Admitting: Internal Medicine

## 2024-02-27 DIAGNOSIS — E7800 Pure hypercholesterolemia, unspecified: Secondary | ICD-10-CM

## 2024-02-27 DIAGNOSIS — Z0000 Encounter for general adult medical examination without abnormal findings: Secondary | ICD-10-CM

## 2024-02-27 DIAGNOSIS — F419 Anxiety disorder, unspecified: Secondary | ICD-10-CM

## 2024-02-27 NOTE — Assessment & Plan Note (Signed)
 Physical today 02/27/24.  PAP 01/2018 - changes c/w atrophy.  Negative HPV.  PAP today 02/11/23 - negative with negative HPV - atrophy. Mammogram 02/07/24 - birads I. Colonoscopy 06/11/22 - recommended f/u in 7-10 years. Marland Kitchen

## 2024-02-27 NOTE — Progress Notes (Signed)
 Subjective:    [name], female    DOB: 1951-08-19, 73 y.o.   MRN: [id number]  Patient here for No chief complaint on file.   HPI Was scheduled for a physical exam. Had to reschedule. Recently evaluated - UTI. Treated with keflex. Protonix controlling acid relfux. Continues on zoloft. Has been previously followed by oncology - completed 5 years of aromasin 08/2018. 02/07/24 - Birads I. Has seen gyn - pelvic organ prolapse. Recommended PFPT. Recommended cervical and upper 1/3 pap - due to DES exposure.    Past Medical History:  Diagnosis Date   Basal cell carcinoma    multiple removed/[facility]   Breast cancer (HCC) 12/13   s/p right lumpectomy, s/p chemo and XRT   DES exposure in utero    Hepatitis A    History of chicken pox    Hyperactive airway disease    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HCC)    Migraines    Miscarriage    x3   Past Surgical History:  Procedure Laterality Date   ABDOMINAL HYSTERECTOMY     BASAL CELL CARCINOMA EXCISION     Multiple   BREAST BIOPSY  1975   Cyst   BREAST LUMPECTOMY WITH AXILLARY LYMPH NODE DISSECTION  01/06/13, 01/22/13   Dr. Wendee Copp, [facility], second surgery for pos margin   CERVICAL FUSION  2001   COLONOSCOPY     COLONOSCOPY WITH PROPOFOL N/A 06/11/2022   Procedure: COLONOSCOPY WITH PROPOFOL;  Surgeon: Toney Reil, MD;  Location: [facility] ENDOSCOPY;  Service: Gastroenterology;  Laterality: N/A;   DILATION AND CURETTAGE OF UTERUS     x 3   LAPAROSCOPIC BILATERAL SALPINGO OOPHERECTOMY Bilateral 12/21/2020   Procedure: LAPAROSCOPIC BILATERAL SALPINGO OOPHORECTOMY;  Surgeon: Ward, Elenora Fender, MD;  Location: [facility] ORS;  Service: Gynecology;  Laterality: Bilateral;   VAGINAL DELIVERY     x4   WISDOM TOOTH EXTRACTION     Family History  Problem Relation Age of Onset   Pancreatic cancer Mother    Cancer Mother        Pancreatic   Arthritis Mother    Hyperlipidemia Mother    Hypertension Mother    Heart attack  Father    Hyperlipidemia Father    Hypertension Father    Heart disease Father 45   Breast cancer Maternal Aunt    Birth defects Maternal Aunt        Breast - in 24's   Breast cancer Maternal Aunt    Birth defects Maternal Aunt        Breast - in 42's   Stroke Maternal Grandmother    Colon cancer Other    Cancer Other 50       Breast and Ovarian- dx 50's/Colon   Social History   Socioeconomic History   Marital status: Married    Spouse name: ED   Number of children: 4   Years of education: Not on file   Highest education level: Bachelor's degree (e.g., BA, AB, BS)  Occupational History   Occupation: Research officer, 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    Social Drivers of Corporate investment banker Strain: Low Risk  (02/23/2024)   Overall Financial Resource Strain (CARDIA)    Difficulty of Paying Living Expenses: Not hard at all  Food Insecurity: No Food Insecurity (02/23/2024)   Hunger Vital Sign    Worried About Running Out of Food in the Last Year: Never true    Ran Out of Food in the Last Year: Never true  Transportation Needs: No Transportation Needs (02/23/2024)   PRAPARE - Administrator, Civil Service (Medical): No    Lack of Transportation (Non-Medical): No  Physical Activity: Insufficiently Active (02/23/2024)   Exercise Vital Sign    Days of Exercise per Week: 2 days    Minutes of Exercise per Session: 30 min  Stress: No Stress Concern Present (02/23/2024)   [employer] of Occupational Health - Occupational Stress Questionnaire    Feeling of Stress : Only a  little  Social Connections: Socially Integrated (02/23/2024)   Social Connection and Isolation Panel [NHANES]    Frequency of Communication with Friends and Family: More than three times a week    Frequency of Social Gatherings with Friends and Family: Three times a week    Attends Religious Services: More than 4 times per year    Active Member of Clubs or Organizations: Yes    Attends Banker Meetings: More than 4 times per year    Marital Status: Married     Review of Systems     Objective:     There were no vitals taken for this visit. Wt Readings from Last 3 Encounters:  02/17/24 149 lb (67.6 kg)  10/23/23 149 lb (67.6 kg)  07/17/23 144 lb 9.6 oz (65.6 kg)    Physical Exam      Outpatient Encounter Medications as of 02/27/2024  Medication Sig   albuterol (VENTOLIN HFA) 108 (90 Base) MCG/ACT inhaler INHALE 2 PUFFS INTO THE LUNGS EVERY 6 HOURS AS NEEDED FOR WHEEZING OR SHORTNESS OF BREATH.   alendronate (FOSAMAX) 70 MG tablet TAKE 1 TABLET EVERY 7 DAYS WITH A FULL GLASS OF WATER ON AN EMPTY STOMACH DO NOT LIE DOWN FOR AT LEAST 30 MIN   BIOTIN PO Take 10,000 mg by mouth daily.   butalbital-aspirin-caffeine (FIORINAL) 50-325-40 MG tablet Take 1 tablet by mouth daily as needed for headache.   cephALEXin (KEFLEX) 500 MG capsule Take 1 capsule (500 mg total) by mouth 2 (two) times daily.   Cholecalciferol (VITAMIN D3) 125 MCG (5000 UT) CAPS Take 5,000 Units by mouth daily.   EVENING PRIMROSE OIL PO Take 1,300 mg by mouth.   Glucosamine-Chondroitin (MOVE FREE PO) Take 1 capsule by mouth daily. Cartilage/boron/hyalauronic acid   ipratropium (ATROVENT) 0.06 % nasal spray SPRAY TWICE INTO EACH NOSTRIL TWICE DAILY   LORazepam (ATIVAN) 0.5 MG tablet Take 1 tablet (0.5 mg total) by mouth daily as needed. for anxiety   MAGNESIUM BISGLYCINATE PO Take 200 mg by mouth daily.   Multiple Minerals-Vitamins (CALCIUM CITRATE PLUS/MAGNESIUM PO) Take 2 tablets by mouth 2 (two) times  daily. CAL CITRATE 500-MAGNESIUM 80-ZINC-10   Omega-3 Fatty Acids (OMEGA-3 FISH OIL PO) Take 1,280 mg by mouth 2 (two) times daily.   pantoprazole (PROTONIX) 20 MG tablet TAKE ONE TABLET BY MOUTH TWICE DAILY BEFORE A MEAL   propranolol (INDERAL) 20 MG tablet Take 1 tablet (20 mg total) by mouth daily.  Riboflavin (VITAMIN B2 PO) Take 250 mg by mouth daily.   sertraline (ZOLOFT) 50 MG tablet TAKE 1 TABLET BY MOUTH DAILY.   traZODone (DESYREL) 50 MG tablet TAKE 1 TABLET BY MOUTH NIGHTLY AS NEEDEDFOR SLEEP.   No facility-administered encounter medications on file as of 02/27/2024.     Lab Results  Component Value Date   WBC 7.0 07/15/2023   HGB 13.9 07/15/2023   HCT 39.8 07/15/2023   PLT 279 07/15/2023   GLUCOSE 96 02/25/2024   CHOL 191 02/25/2024   TRIG 67.0 02/25/2024   HDL 69.30 02/25/2024   LDLCALC 108 (H) 02/25/2024   ALT 178 (H) 02/25/2024   AST 118 (H) 02/25/2024   NA 140 02/25/2024   K 3.7 02/25/2024   CL 102 02/25/2024   CREATININE 0.71 02/25/2024   BUN 17 02/25/2024   CO2 30 02/25/2024   TSH 4.11 06/10/2023   INR 0.9 02/15/2023   MICROALBUR <0.7 11/14/2015    MM Outside Films Mammo Result Date: 02/07/2024 This examination belongs to an outside facility and is stored here for comparison purposes only.  Contact the originating outside institution for any associated report or interpretation.  MM Outside Films Mammo Result Date: 02/07/2024 This examination belongs to an outside facility and is stored here for comparison purposes only.  Contact the originating outside institution for any associated report or interpretation.  MM Outside Films Mammo Result Date: 02/07/2024 This examination belongs to an outside facility and is stored here for comparison purposes only.  Contact the originating outside institution for any associated report or interpretation.  MM Outside Films Mammo Result Date: 02/07/2024 This examination belongs to an outside facility and is stored here for  comparison purposes only.  Contact the originating outside institution for any associated report or interpretation.  MM Outside Films Mammo Result Date: 02/07/2024 This examination belongs to an outside facility and is stored here for comparison purposes only.  Contact the originating outside institution for any associated report or interpretation.      Assessment & Plan:  Healthcare maintenance Assessment & Plan: Physical today 02/27/24.  PAP 01/2018 - changes c/w atrophy.  Negative HPV.  PAP today 02/11/23 - negative with negative HPV - atrophy. Mammogram 02/07/24 - birads I. Colonoscopy 06/11/22 - recommended f/u in 7-10 years. .    Hypercholesterolemia  Anxiety     Dale [place], MD

## 2024-03-09 ENCOUNTER — Encounter: Admitting: Internal Medicine

## 2024-03-09 DIAGNOSIS — Z0000 Encounter for general adult medical examination without abnormal findings: Secondary | ICD-10-CM

## 2024-03-10 ENCOUNTER — Telehealth: Payer: Self-pay | Admitting: Internal Medicine

## 2024-03-10 NOTE — Telephone Encounter (Signed)
 Spoke with patient she says she is feeling a better today. Patient says when she had the symptoms yesterday. Her blood pressure reading was 83/60 and that was the lowest reading she had. Patient says her hear rate and oxygen were within normal limits. Patient says she thinks it may have been dehydration, and she added more electrolytes to her fluids and she seems to be doing better. Patient says she has a tendency of having low blood pressure. Patient says she doesn't really pay attention to her readings or become alarmed until she experienced the light-headed, but says she is feeling better today, and has a scheduled appointment tomorrow with provider at 11 a. Patient advised if she is not feeling better or symptoms return to reach out to our office before scheduled appointment.

## 2024-03-10 NOTE — Telephone Encounter (Signed)
 Please call and confirm doing ok. Do need to know how blood pressures are doing and if any symptoms today.

## 2024-03-10 NOTE — Telephone Encounter (Signed)
 MyChart message:  Cindy Arnold  P [facility] (supporting Dale Leadington, MD)14 hours ago (4:35 PM)   [name]  Just an fyi. My BP has been low today, 3/17. I was light headed earlier in day,  but I am better now. Of course I use a wrist BP device so I don't know how accurate that is. If it is still low tomorrow(Tues), could I run by and have it checked there?

## 2024-03-11 ENCOUNTER — Encounter: Payer: Self-pay | Admitting: Internal Medicine

## 2024-03-11 ENCOUNTER — Ambulatory Visit (INDEPENDENT_AMBULATORY_CARE_PROVIDER_SITE_OTHER): Admitting: Internal Medicine

## 2024-03-11 VITALS — BP 108/68 | HR 87 | Temp 97.8°F | Ht 68.0 in | Wt 144.6 lb

## 2024-03-11 DIAGNOSIS — Z0000 Encounter for general adult medical examination without abnormal findings: Secondary | ICD-10-CM | POA: Diagnosis not present

## 2024-03-11 DIAGNOSIS — J4520 Mild intermittent asthma, uncomplicated: Secondary | ICD-10-CM

## 2024-03-11 DIAGNOSIS — I959 Hypotension, unspecified: Secondary | ICD-10-CM | POA: Diagnosis not present

## 2024-03-11 DIAGNOSIS — R7989 Other specified abnormal findings of blood chemistry: Secondary | ICD-10-CM | POA: Diagnosis not present

## 2024-03-11 DIAGNOSIS — E7800 Pure hypercholesterolemia, unspecified: Secondary | ICD-10-CM

## 2024-03-11 DIAGNOSIS — I471 Supraventricular tachycardia, unspecified: Secondary | ICD-10-CM | POA: Diagnosis not present

## 2024-03-11 DIAGNOSIS — F419 Anxiety disorder, unspecified: Secondary | ICD-10-CM | POA: Diagnosis not present

## 2024-03-11 DIAGNOSIS — N8110 Cystocele, unspecified: Secondary | ICD-10-CM | POA: Diagnosis not present

## 2024-03-11 DIAGNOSIS — K219 Gastro-esophageal reflux disease without esophagitis: Secondary | ICD-10-CM

## 2024-03-11 LAB — BASIC METABOLIC PANEL
BUN: 15 mg/dL (ref 6–23)
CO2: 28 meq/L (ref 19–32)
Calcium: 9.3 mg/dL (ref 8.4–10.5)
Chloride: 101 meq/L (ref 96–112)
Creatinine, Ser: 0.67 mg/dL (ref 0.40–1.20)
GFR: 87.69 mL/min (ref >60.00)
Glucose, Bld: 84 mg/dL (ref 70–99)
Potassium: 4.1 meq/L (ref 3.5–5.1)
Sodium: 137 meq/L (ref 135–145)

## 2024-03-11 LAB — HEPATIC FUNCTION PANEL
ALT: 132 U/L — ABNORMAL HIGH (ref 0–35)
AST: 72 U/L — ABNORMAL HIGH (ref 0–37)
Albumin: 4.5 g/dL (ref 3.5–5.2)
Alkaline Phosphatase: 69 U/L (ref 39–117)
Bilirubin, Direct: 0.1 mg/dL (ref 0.0–0.3)
Total Bilirubin: 0.6 mg/dL (ref 0.2–1.2)
Total Protein: 7 g/dL (ref 6.0–8.3)

## 2024-03-11 NOTE — Patient Instructions (Signed)
 Stop propranolol.

## 2024-03-11 NOTE — Assessment & Plan Note (Addendum)
 Physical today 03/11/24.  PAP 01/2018 - changes c/w atrophy.  Negative HPV.  PAP 02/11/23 - negative with negative HPV - atrophy. Needs f/u with gyn for paps given EDS exposure. Mammogram 02/07/24 - birads I. Colonoscopy 06/11/22 - recommended f/u in 7-10 years. Marland Kitchen

## 2024-03-11 NOTE — Progress Notes (Signed)
 Subjective:    [name], female    DOB: Apr 16, 1951, 73 y.o.   MRN: [id number]  Patient here for  Chief Complaint  Patient presents with   Annual Exam    HPI Here for a physical exam. Recently evaluated - UTI. Treated with keflex. Protonix controlling acid relfux. Continues on zoloft. Has been previously followed by oncology - completed 5 years of aromasin 08/2018. 02/07/24 - Birads I. Has seen gyn - pelvic organ prolapse. Recommended PFPT. Discussed. Agreeable for referral. Reported that 3/17 - she notcied low blood pressure. Reports oxygen and pulse rate - ok. Had reported some light headedness prior to checking. Felt it may have been due to dehydration. Started drinking more and added some electrolytes and felt better. No chest pain or sob reported. No increased heart rate or palpitations. Feeling better. Eating. No nausea or vomiting. No bowel change reported.    Past Medical History:  Diagnosis Date   Basal cell carcinoma    multiple removed/[facility]   Breast cancer (HCC) 12/13   s/p right lumpectomy, s/p chemo and XRT   DES exposure in utero    Hepatitis A    History of chicken pox    Hyperactive airway disease    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HCC)    Migraines    Miscarriage    x3   Past Surgical History:  Procedure Laterality Date   ABDOMINAL HYSTERECTOMY     BASAL CELL CARCINOMA EXCISION     Multiple   BREAST BIOPSY  1975   Cyst   BREAST LUMPECTOMY WITH AXILLARY LYMPH NODE DISSECTION  01/06/13, 01/22/13   Dr. Wendee Copp, [facility], second surgery for pos margin   CERVICAL FUSION  2001   COLONOSCOPY     COLONOSCOPY WITH PROPOFOL N/A 06/11/2022   Procedure: COLONOSCOPY WITH PROPOFOL;  Surgeon: Toney Reil, MD;  Location: [facility] ENDOSCOPY;  Service: Gastroenterology;  Laterality: N/A;   DILATION AND CURETTAGE OF UTERUS     x 3   LAPAROSCOPIC BILATERAL SALPINGO OOPHERECTOMY Bilateral 12/21/2020   Procedure: LAPAROSCOPIC BILATERAL SALPINGO  OOPHORECTOMY;  Surgeon: Ward, Elenora Fender, MD;  Location: [facility] ORS;  Service: Gynecology;  Laterality: Bilateral;   VAGINAL DELIVERY     x4   WISDOM TOOTH EXTRACTION     Family History  Problem Relation Age of Onset   Pancreatic cancer Mother    Cancer Mother        Pancreatic   Arthritis Mother    Hyperlipidemia Mother    Hypertension Mother    Heart attack Father    Hyperlipidemia Father    Hypertension Father    Heart disease Father 41   Breast cancer Maternal Aunt    Birth defects Maternal Aunt        Breast - in 1's   Breast cancer Maternal Aunt    Birth defects Maternal Aunt        Breast - in 33's   Stroke Maternal Grandmother    Colon cancer Other    Cancer Other 50       Breast and Ovarian- dx 50's/Colon   Social History   Socioeconomic History   Marital status: Married    Spouse name: ED   Number of children: 4   Years of education: Not on file   Highest education level: Bachelor's degree (e.g., BA, AB, BS)  Occupational History   Occupation: Research officer, 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    Social Drivers of Corporate investment banker Strain: Low Risk  (02/23/2024)   Overall Financial Resource Strain (CARDIA)    Difficulty of Paying Living Expenses: Not hard at all  Food Insecurity: No Food Insecurity (02/23/2024)   Hunger Vital Sign    Worried About Running Out of Food in the Last Year: Never true    Ran Out of Food in the Last Year: Never true  Transportation Needs: No Transportation Needs (02/23/2024)   PRAPARE - Administrator, Civil Service  (Medical): No    Lack of Transportation (Non-Medical): No  Physical Activity: Insufficiently Active (02/23/2024)   Exercise Vital Sign    Days of Exercise per Week: 2 days    Minutes of Exercise per Session: 30 min  Stress: No Stress Concern Present (02/23/2024)   [employer] of Occupational Health - Occupational Stress Questionnaire    Feeling of Stress : Only a little  Social Connections: Socially Integrated (02/23/2024)   Social Connection and Isolation Panel [NHANES]    Frequency of Communication with Friends and Family: More than three times a week    Frequency of Social Gatherings with Friends and Family: Three times a week    Attends Religious Services: More than 4 times per year    Active Member of Clubs or Organizations: Yes    Attends Banker Meetings: More than 4 times per year    Marital Status: Married     Review of Systems  Constitutional:  Negative for appetite change and unexpected weight change.  HENT:  Negative for sinus pressure and sore throat.   Respiratory:  Negative for cough, chest tightness and shortness of breath.   Cardiovascular:  Negative for chest pain, palpitations and leg swelling.  Gastrointestinal:  Negative for abdominal pain, diarrhea, nausea and vomiting.  Genitourinary:  Negative for difficulty urinating and dysuria.  Musculoskeletal:  Negative for joint swelling and myalgias.  Skin:  Negative for color change and rash.  Neurological:  Negative for dizziness and headaches.  Psychiatric/Behavioral:  Negative for agitation and dysphoric mood.        Objective:     BP 108/68   Pulse 87   Temp 97.8 F (36.6 C)   Ht 5\' 8"  (1.727 m)   Wt 144 lb 9.6 oz (65.6 kg)   SpO2 98%   BMI 21.99 kg/m  Wt Readings from Last 3 Encounters:  03/11/24 144 lb 9.6 oz (65.6 kg)  02/17/24 149 lb (67.6 kg)  10/23/23 149 lb (67.6 kg)    Physical Exam Vitals reviewed.  Constitutional:      General: She is not in acute distress.     Appearance: Normal appearance. She is well-developed.  HENT:     Head: Normocephalic and atraumatic.     Right Ear: External ear normal.     Left Ear: External ear normal.     Mouth/Throat:     Pharynx: No oropharyngeal exudate or posterior oropharyngeal erythema.  Eyes:     General: No scleral icterus.  Right eye: No discharge.        Left eye: No discharge.     Conjunctiva/sclera: Conjunctivae normal.  Neck:     Thyroid: No thyromegaly.  Cardiovascular:     Rate and Rhythm: Normal rate and regular rhythm.  Pulmonary:     Effort: No tachypnea, accessory muscle usage or respiratory distress.     Breath sounds: Normal breath sounds. No decreased breath sounds, wheezing or rhonchi.  Chest:  Breasts:    Right: No inverted nipple, mass, nipple discharge or tenderness (no axillary adenopathy).     Left: No inverted nipple, mass, nipple discharge or tenderness (no axilarry adenopathy).  Abdominal:     General: Bowel sounds are normal.     Palpations: Abdomen is soft.     Tenderness: There is no abdominal tenderness.  Musculoskeletal:        General: No swelling or tenderness.     Cervical back: Neck supple.  Lymphadenopathy:     Cervical: No cervical adenopathy.  Skin:    General: Skin is warm.     Findings: No erythema or rash.  Neurological:     Mental Status: She is alert and oriented to person, place, and time.  Psychiatric:        Mood and Affect: Mood normal.        Behavior: Behavior normal.         Outpatient Encounter Medications as of 03/11/2024  Medication Sig   albuterol (VENTOLIN HFA) 108 (90 Base) MCG/ACT inhaler INHALE 2 PUFFS INTO THE LUNGS EVERY 6 HOURS AS NEEDED FOR WHEEZING OR SHORTNESS OF BREATH.   alendronate (FOSAMAX) 70 MG tablet TAKE 1 TABLET EVERY 7 DAYS WITH A FULL GLASS OF WATER ON AN EMPTY STOMACH DO NOT LIE DOWN FOR AT LEAST 30 MIN   BIOTIN PO Take 10,000 mg by mouth daily.   butalbital-aspirin-caffeine (FIORINAL) 50-325-40 MG tablet Take  1 tablet by mouth daily as needed for headache.   Cholecalciferol (VITAMIN D3) 125 MCG (5000 UT) CAPS Take 5,000 Units by mouth daily.   EVENING PRIMROSE OIL PO Take 1,300 mg by mouth.   Glucosamine-Chondroitin (MOVE FREE PO) Take 1 capsule by mouth daily. Cartilage/boron/hyalauronic acid   ipratropium (ATROVENT) 0.06 % nasal spray SPRAY TWICE INTO EACH NOSTRIL TWICE DAILY   LORazepam (ATIVAN) 0.5 MG tablet Take 1 tablet (0.5 mg total) by mouth daily as needed. for anxiety   MAGNESIUM BISGLYCINATE PO Take 200 mg by mouth daily.   Multiple Minerals-Vitamins (CALCIUM CITRATE PLUS/MAGNESIUM PO) Take 2 tablets by mouth 2 (two) times daily. CAL CITRATE 500-MAGNESIUM 80-ZINC-10   Omega-3 Fatty Acids (OMEGA-3 FISH OIL PO) Take 1,280 mg by mouth 2 (two) times daily.   pantoprazole (PROTONIX) 20 MG tablet TAKE ONE TABLET BY MOUTH TWICE DAILY BEFORE A MEAL   propranolol (INDERAL) 20 MG tablet Take 1 tablet (20 mg total) by mouth daily.   Riboflavin (VITAMIN B2 PO) Take 250 mg by mouth daily.   sertraline (ZOLOFT) 50 MG tablet TAKE 1 TABLET BY MOUTH DAILY.   traZODone (DESYREL) 50 MG tablet TAKE 1 TABLET BY MOUTH NIGHTLY AS NEEDEDFOR SLEEP.   No facility-administered encounter medications on file as of 03/11/2024.     Lab Results  Component Value Date   WBC 7.0 07/15/2023   HGB 13.9 07/15/2023   HCT 39.8 07/15/2023   PLT 279 07/15/2023   GLUCOSE 84 03/11/2024   CHOL 191 02/25/2024   TRIG 67.0 02/25/2024   HDL 69.30 02/25/2024   LDLCALC  108 (H) 02/25/2024   ALT 132 (H) 03/11/2024   AST 72 (H) 03/11/2024   NA 137 03/11/2024   K 4.1 03/11/2024   CL 101 03/11/2024   CREATININE 0.67 03/11/2024   BUN 15 03/11/2024   CO2 28 03/11/2024   TSH 4.11 06/10/2023   INR 0.9 02/15/2023   MICROALBUR <0.7 11/14/2015    MM Outside Films Mammo Result Date: 02/07/2024 This examination belongs to an outside facility and is stored here for comparison purposes only.  Contact the originating outside  institution for any associated report or interpretation.  MM Outside Films Mammo Result Date: 02/07/2024 This examination belongs to an outside facility and is stored here for comparison purposes only.  Contact the originating outside institution for any associated report or interpretation.  MM Outside Films Mammo Result Date: 02/07/2024 This examination belongs to an outside facility and is stored here for comparison purposes only.  Contact the originating outside institution for any associated report or interpretation.  MM Outside Films Mammo Result Date: 02/07/2024 This examination belongs to an outside facility and is stored here for comparison purposes only.  Contact the originating outside institution for any associated report or interpretation.  MM Outside Films Mammo Result Date: 02/07/2024 This examination belongs to an outside facility and is stored here for comparison purposes only.  Contact the originating outside institution for any associated report or interpretation.      Assessment & Plan:  Healthcare maintenance Assessment & Plan: Physical today 03/11/24.  PAP 01/2018 - changes c/w atrophy.  Negative HPV.  PAP 02/11/23 - negative with negative HPV - atrophy. Needs f/u with gyn for paps given EDS exposure. Mammogram 02/07/24 - birads I. Colonoscopy 06/11/22 - recommended f/u in 7-10 years. .    Hypotension, unspecified hypotension type Assessment & Plan: Recent episode of low pressure as outlined. Discussed EKG. She feels the low pressure was related to dehydration. Feels better now. Wants to hold on further w/up. Will stop propranolol as outlined. Follow pressures.   Orders: -     Basic metabolic panel  Abnormal liver function tests Assessment & Plan: Recent elevation in ALT, AST and alk phos.  Previous ultrasound - no acute findings.  Small liver cyst. Discussed any new medications/supplements.  No increased alcohol.  She cut out one of her supplements.  Previously saw GI.  Follow liver function tests. Will recheck today.   Orders: -     Hepatic function panel  Paroxysmal SVT (supraventricular tachycardia) (HCC) Assessment & Plan: She has been on propranolol for years. Now on decreased dose per cardiology - on 20mg  q day. Given low blood pressure, she has held propranolol for the last few days. Blood pressure 110/78 standing and 112/76 lying down. Will remain off propranolol. Follow pressures and heart rate.    Hypercholesterolemia Assessment & Plan: The 10-year ASCVD risk score (Arnett DK, et al., 2019) is: 7.3%   Values used to calculate the score:     Age: 47 years     Sex: Female     Is Non-Hispanic African American: No     Diabetic: No     Tobacco smoker: No     Systolic Blood Pressure: 108 mmHg     Is BP treated: No     HDL Cholesterol: 69.3 mg/dL     Total Cholesterol: 191 mg/dL  Low cholesterol diet and exercise. Follow lipid panel.    Gastroesophageal reflux disease, unspecified whether esophagitis present Assessment & Plan: No upper symptoms reported. On protonix.  Anxiety Assessment & Plan: Overall appears to be doing well.  On zoloft. Follow.    Mild intermittent asthma, unspecified whether complicated Assessment & Plan: Breathing stable. No increased sob reported.    Vaginal prolapse Assessment & Plan: Saw gyn - pelvic floor prolapse. Recommended PFPT. Agreeable.   Orders: -     Ambulatory referral to Physical Therapy     Dale Story, MD

## 2024-03-12 ENCOUNTER — Other Ambulatory Visit: Payer: Self-pay

## 2024-03-12 DIAGNOSIS — R7989 Other specified abnormal findings of blood chemistry: Secondary | ICD-10-CM

## 2024-03-16 ENCOUNTER — Encounter: Payer: Self-pay | Admitting: Internal Medicine

## 2024-03-16 DIAGNOSIS — N8110 Cystocele, unspecified: Secondary | ICD-10-CM | POA: Insufficient documentation

## 2024-03-16 NOTE — Assessment & Plan Note (Signed)
 Recent elevation in ALT, AST and alk phos.  Previous ultrasound - no acute findings.  Small liver cyst. Discussed any new medications/supplements.  No increased alcohol.  She cut out one of her supplements.  Previously saw GI. Follow liver function tests. Will recheck today.

## 2024-03-16 NOTE — Assessment & Plan Note (Signed)
No upper symptoms reported.  On protonix.   

## 2024-03-16 NOTE — Assessment & Plan Note (Signed)
Overall appears to be doing well.  On zoloft. Follow.  

## 2024-03-16 NOTE — Assessment & Plan Note (Signed)
 Saw gyn - pelvic floor prolapse. Recommended PFPT. Agreeable.

## 2024-03-16 NOTE — Assessment & Plan Note (Signed)
 Recent episode of low pressure as outlined. Discussed EKG. She feels the low pressure was related to dehydration. Feels better now. Wants to hold on further w/up. Will stop propranolol as outlined. Follow pressures.

## 2024-03-16 NOTE — Assessment & Plan Note (Signed)
 She has been on propranolol for years. Now on decreased dose per cardiology - on 20mg  q day. Given low blood pressure, she has held propranolol for the last few days. Blood pressure 110/78 standing and 112/76 lying down. Will remain off propranolol. Follow pressures and heart rate.

## 2024-03-16 NOTE — Assessment & Plan Note (Signed)
 Breathing stable. No increased sob reported.

## 2024-03-16 NOTE — Assessment & Plan Note (Signed)
 The 10-year ASCVD risk score (Arnett DK, et al., 2019) is: 7.3%   Values used to calculate the score:     Age: 73 years     Sex: Female     Is Non-Hispanic African American: No     Diabetic: No     Tobacco smoker: No     Systolic Blood Pressure: 108 mmHg     Is BP treated: No     HDL Cholesterol: 69.3 mg/dL     Total Cholesterol: 191 mg/dL  Low cholesterol diet and exercise. Follow lipid panel.

## 2024-03-26 DIAGNOSIS — H35052 Retinal neovascularization, unspecified, left eye: Secondary | ICD-10-CM | POA: Diagnosis not present

## 2024-03-26 DIAGNOSIS — H3589 Other specified retinal disorders: Secondary | ICD-10-CM | POA: Diagnosis not present

## 2024-04-01 ENCOUNTER — Other Ambulatory Visit (INDEPENDENT_AMBULATORY_CARE_PROVIDER_SITE_OTHER)

## 2024-04-01 DIAGNOSIS — R7989 Other specified abnormal findings of blood chemistry: Secondary | ICD-10-CM | POA: Diagnosis not present

## 2024-04-01 LAB — HEPATIC FUNCTION PANEL
ALT: 39 U/L — ABNORMAL HIGH (ref 0–35)
AST: 34 U/L (ref 0–37)
Albumin: 4.3 g/dL (ref 3.5–5.2)
Alkaline Phosphatase: 60 U/L (ref 39–117)
Bilirubin, Direct: 0.1 mg/dL (ref 0.0–0.3)
Total Bilirubin: 0.6 mg/dL (ref 0.2–1.2)
Total Protein: 6.9 g/dL (ref 6.0–8.3)

## 2024-04-13 ENCOUNTER — Other Ambulatory Visit: Payer: Self-pay | Admitting: Internal Medicine

## 2024-04-13 DIAGNOSIS — D485 Neoplasm of uncertain behavior of skin: Secondary | ICD-10-CM | POA: Diagnosis not present

## 2024-04-13 DIAGNOSIS — L821 Other seborrheic keratosis: Secondary | ICD-10-CM | POA: Diagnosis not present

## 2024-04-13 DIAGNOSIS — Z85828 Personal history of other malignant neoplasm of skin: Secondary | ICD-10-CM | POA: Diagnosis not present

## 2024-04-13 DIAGNOSIS — L570 Actinic keratosis: Secondary | ICD-10-CM | POA: Diagnosis not present

## 2024-04-13 DIAGNOSIS — D0372 Melanoma in situ of left lower limb, including hip: Secondary | ICD-10-CM | POA: Diagnosis not present

## 2024-04-13 DIAGNOSIS — D2262 Melanocytic nevi of left upper limb, including shoulder: Secondary | ICD-10-CM | POA: Diagnosis not present

## 2024-04-13 DIAGNOSIS — D2271 Melanocytic nevi of right lower limb, including hip: Secondary | ICD-10-CM | POA: Diagnosis not present

## 2024-04-13 DIAGNOSIS — Z08 Encounter for follow-up examination after completed treatment for malignant neoplasm: Secondary | ICD-10-CM | POA: Diagnosis not present

## 2024-04-13 DIAGNOSIS — D2272 Melanocytic nevi of left lower limb, including hip: Secondary | ICD-10-CM | POA: Diagnosis not present

## 2024-04-13 DIAGNOSIS — D225 Melanocytic nevi of trunk: Secondary | ICD-10-CM | POA: Diagnosis not present

## 2024-04-13 DIAGNOSIS — D2261 Melanocytic nevi of right upper limb, including shoulder: Secondary | ICD-10-CM | POA: Diagnosis not present

## 2024-04-13 DIAGNOSIS — Z85820 Personal history of malignant melanoma of skin: Secondary | ICD-10-CM | POA: Diagnosis not present

## 2024-04-21 DIAGNOSIS — D0372 Melanoma in situ of left lower limb, including hip: Secondary | ICD-10-CM | POA: Diagnosis not present

## 2024-04-29 ENCOUNTER — Encounter: Payer: Self-pay | Admitting: Internal Medicine

## 2024-04-29 ENCOUNTER — Other Ambulatory Visit (INDEPENDENT_AMBULATORY_CARE_PROVIDER_SITE_OTHER)

## 2024-04-29 DIAGNOSIS — E7800 Pure hypercholesterolemia, unspecified: Secondary | ICD-10-CM | POA: Diagnosis not present

## 2024-04-29 LAB — HEPATIC FUNCTION PANEL
ALT: 32 U/L (ref 0–35)
AST: 32 U/L (ref 0–37)
Albumin: 4.3 g/dL (ref 3.5–5.2)
Alkaline Phosphatase: 58 U/L (ref 39–117)
Bilirubin, Direct: 0.1 mg/dL (ref 0.0–0.3)
Total Bilirubin: 0.5 mg/dL (ref 0.2–1.2)
Total Protein: 7 g/dL (ref 6.0–8.3)

## 2024-04-30 ENCOUNTER — Other Ambulatory Visit: Payer: Self-pay | Admitting: Internal Medicine

## 2024-06-05 ENCOUNTER — Other Ambulatory Visit

## 2024-06-09 ENCOUNTER — Other Ambulatory Visit: Payer: Self-pay | Admitting: Internal Medicine

## 2024-06-11 ENCOUNTER — Ambulatory Visit: Admitting: Internal Medicine

## 2024-06-19 ENCOUNTER — Ambulatory Visit: Payer: Self-pay | Admitting: Internal Medicine

## 2024-06-19 ENCOUNTER — Other Ambulatory Visit: Payer: Self-pay | Admitting: Internal Medicine

## 2024-06-19 ENCOUNTER — Other Ambulatory Visit (INDEPENDENT_AMBULATORY_CARE_PROVIDER_SITE_OTHER)

## 2024-06-19 DIAGNOSIS — F419 Anxiety disorder, unspecified: Secondary | ICD-10-CM

## 2024-06-19 DIAGNOSIS — R7989 Other specified abnormal findings of blood chemistry: Secondary | ICD-10-CM

## 2024-06-19 DIAGNOSIS — E7800 Pure hypercholesterolemia, unspecified: Secondary | ICD-10-CM | POA: Diagnosis not present

## 2024-06-19 LAB — LIPID PANEL
Cholesterol: 203 mg/dL — ABNORMAL HIGH (ref 0–200)
HDL: 78.9 mg/dL (ref >39.00)
LDL Cholesterol: 114 mg/dL — ABNORMAL HIGH (ref 0–99)
NonHDL: 124.17
Total CHOL/HDL Ratio: 3
Triglycerides: 53 mg/dL (ref 0.0–149.0)
VLDL: 10.6 mg/dL (ref 0.0–40.0)

## 2024-06-19 LAB — HEPATIC FUNCTION PANEL
ALT: 25 U/L (ref 0–35)
AST: 30 U/L (ref 0–37)
Albumin: 4.3 g/dL (ref 3.5–5.2)
Alkaline Phosphatase: 52 U/L (ref 39–117)
Bilirubin, Direct: 0.1 mg/dL (ref 0.0–0.3)
Total Bilirubin: 0.5 mg/dL (ref 0.2–1.2)
Total Protein: 7.2 g/dL (ref 6.0–8.3)

## 2024-06-19 LAB — BASIC METABOLIC PANEL WITH GFR
BUN: 18 mg/dL (ref 6–23)
CO2: 30 meq/L (ref 19–32)
Calcium: 9.1 mg/dL (ref 8.4–10.5)
Chloride: 102 meq/L (ref 96–112)
Creatinine, Ser: 0.74 mg/dL (ref 0.40–1.20)
GFR: 81.01 mL/min (ref >60.00)
Glucose, Bld: 89 mg/dL (ref 70–99)
Potassium: 3.7 meq/L (ref 3.5–5.1)
Sodium: 137 meq/L (ref 135–145)

## 2024-06-19 LAB — TSH: TSH: 3.41 u[IU]/mL (ref 0.35–5.50)

## 2024-06-19 NOTE — Progress Notes (Signed)
Order placed for f/u liver panel.  

## 2024-06-21 ENCOUNTER — Other Ambulatory Visit: Payer: Self-pay | Admitting: Internal Medicine

## 2024-06-23 ENCOUNTER — Ambulatory Visit (INDEPENDENT_AMBULATORY_CARE_PROVIDER_SITE_OTHER)

## 2024-06-23 ENCOUNTER — Ambulatory Visit: Payer: Self-pay | Admitting: Internal Medicine

## 2024-06-23 ENCOUNTER — Ambulatory Visit (INDEPENDENT_AMBULATORY_CARE_PROVIDER_SITE_OTHER): Admitting: Internal Medicine

## 2024-06-23 ENCOUNTER — Encounter: Payer: Self-pay | Admitting: Internal Medicine

## 2024-06-23 VITALS — BP 118/66 | HR 81 | Temp 97.7°F | Ht 68.0 in | Wt 146.6 lb

## 2024-06-23 DIAGNOSIS — M25552 Pain in left hip: Secondary | ICD-10-CM | POA: Diagnosis not present

## 2024-06-23 DIAGNOSIS — E7800 Pure hypercholesterolemia, unspecified: Secondary | ICD-10-CM

## 2024-06-23 DIAGNOSIS — M47816 Spondylosis without myelopathy or radiculopathy, lumbar region: Secondary | ICD-10-CM | POA: Diagnosis not present

## 2024-06-23 DIAGNOSIS — F419 Anxiety disorder, unspecified: Secondary | ICD-10-CM

## 2024-06-23 DIAGNOSIS — R7989 Other specified abnormal findings of blood chemistry: Secondary | ICD-10-CM

## 2024-06-23 DIAGNOSIS — I471 Supraventricular tachycardia, unspecified: Secondary | ICD-10-CM

## 2024-06-23 DIAGNOSIS — K219 Gastro-esophageal reflux disease without esophagitis: Secondary | ICD-10-CM | POA: Diagnosis not present

## 2024-06-23 DIAGNOSIS — J4520 Mild intermittent asthma, uncomplicated: Secondary | ICD-10-CM | POA: Diagnosis not present

## 2024-06-23 DIAGNOSIS — M1612 Unilateral primary osteoarthritis, left hip: Secondary | ICD-10-CM | POA: Diagnosis not present

## 2024-06-23 MED ORDER — ROSUVASTATIN CALCIUM 10 MG PO TABS: 10.0000 mg | ORAL_TABLET | Freq: Every day | ORAL | 2 refills | Status: DC

## 2024-06-23 NOTE — Progress Notes (Signed)
 Subjective:    [name], female    DOB: 22-Sep-1951, 73 y.o.   MRN: [id number]  Patient here for  Chief Complaint  Patient presents with   Medical Management of Chronic Issues    HPI Here for a scheduled follow up - follow up regarding GERD, increased stress and hypercholesterolemia. Last visit, had reported some low blood pressures. Prropranolol was held. Doing well off propranolol . No chest pain or sob reported. Does report left hip pain. Present over the last few months. Worse lying down. Has been seeing chiropractor. Exercises. Persistent pain. Also previously referred for PFPT. Has upcoming appt. Discussed labs. Discussed calculated cholesterol risk. Discussed starting cholesterol medication.   Past Medical History:  Diagnosis Date   Basal cell carcinoma    multiple removed/[facility]   Breast cancer (HCC) 12/13   s/p right lumpectomy, s/p chemo and XRT   DES exposure in utero    Hepatitis A    History of chicken pox    Hyperactive airway disease    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HCC)    Migraines    Miscarriage    x3   Past Surgical History:  Procedure Laterality Date   ABDOMINAL HYSTERECTOMY     BASAL CELL CARCINOMA EXCISION     Multiple   BREAST BIOPSY  1975   Cyst   BREAST LUMPECTOMY WITH AXILLARY LYMPH NODE DISSECTION  01/06/13, 01/22/13   Dr. Georgaide, [facility], second surgery for pos margin   CERVICAL FUSION  2001   COLONOSCOPY     COLONOSCOPY WITH PROPOFOL  N/A 06/11/2022   Procedure: COLONOSCOPY WITH PROPOFOL ;  Surgeon: Unk Corinn Skiff, MD;  Location: [facility];  Service: Gastroenterology;  Laterality: N/A;   DILATION AND CURETTAGE OF UTERUS     x 3   LAPAROSCOPIC BILATERAL SALPINGO OOPHERECTOMY Bilateral 12/21/2020   Procedure: LAPAROSCOPIC BILATERAL SALPINGO OOPHORECTOMY;  Surgeon: Ward, Mitzie BROCKS, MD;  Location: [facility];  Service: Gynecology;  Laterality: Bilateral;   VAGINAL DELIVERY     x4   WISDOM TOOTH EXTRACTION      Family History  Problem Relation Age of Onset   Pancreatic cancer Mother    Cancer Mother        Pancreatic   Arthritis Mother    Hyperlipidemia Mother    Hypertension Mother    Heart attack Father    Hyperlipidemia Father    Hypertension Father    Heart disease Father 14   Breast cancer Maternal Aunt    Birth defects Maternal Aunt        Breast - in 72's   Breast cancer Maternal Aunt    Birth defects Maternal Aunt        Breast - in 69's   Stroke Maternal Grandmother    Colon cancer Other    Cancer Other 50       Breast and Ovarian- dx 50's/Colon   Social History   Socioeconomic History   Marital status: Married    Spouse name: ED   Number of children: 4   Years of education: Not on file   Highest education level: Bachelor's degree (e.g., BA, AB, BS)  Occupational History   Occupation: Research officer, 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    Social Drivers of Corporate investment banker Strain: Low Risk  (06/19/2024)   Overall Financial Resource Strain (CARDIA)    Difficulty of Paying Living Expenses: Not hard at all  Food Insecurity: No Food Insecurity (06/19/2024)   Hunger Vital Sign    Worried About Running Out of Food in the Last Year: Never true    Ran Out of Food in the Last Year: Never true  Transportation Needs: No Transportation Needs (06/19/2024)   PRAPARE - Administrator, Civil Service (Medical): No    Lack of Transportation (Non-Medical): No  Physical Activity: Insufficiently Active (06/19/2024)   Exercise Vital Sign    Days of Exercise per Week: 2  days    Minutes of Exercise per Session: 20 min  Stress: No Stress Concern Present (06/19/2024)   [employer] of Occupational Health - Occupational Stress Questionnaire    Feeling of Stress: Only a little  Social Connections: Socially Integrated (06/19/2024)   Social Connection and Isolation Panel    Frequency of Communication with Friends and Family: More than three times a week    Frequency of Social Gatherings with Friends and Family: Three times a week    Attends Religious Services: More than 4 times per year    Active Member of Clubs or Organizations: Yes    Attends Banker Meetings: More than 4 times per year    Marital Status: Married     Review of Systems  Constitutional:  Negative for appetite change and unexpected weight change.  HENT:  Negative for congestion and sinus pressure.   Respiratory:  Negative for cough, chest tightness and shortness of breath.   Cardiovascular:  Negative for chest pain, palpitations and leg swelling.  Gastrointestinal:  Negative for abdominal pain, diarrhea, nausea and vomiting.  Genitourinary:  Negative for difficulty urinating and dysuria.  Musculoskeletal:  Negative for joint swelling and myalgias.       Left hip pain as outined.   Skin:  Negative for color change and rash.  Neurological:  Negative for dizziness and headaches.  Psychiatric/Behavioral:  Negative for agitation and dysphoric mood.        Objective:     BP 118/66   Pulse 81   Temp 97.7 F (36.5 C)   Ht 5' 8 (1.727 m)   Wt 146 lb 9.6 oz (66.5 kg)   SpO2 97%   BMI 22.29 kg/m  Wt Readings from Last 3 Encounters:  06/23/24 146 lb 9.6 oz (66.5 kg)  03/11/24 144 lb 9.6 oz (65.6 kg)  02/17/24 149 lb (67.6 kg)    Physical Exam Vitals reviewed.  Constitutional:      General: She is not in acute distress.    Appearance: Normal appearance.  HENT:     Head: Normocephalic and atraumatic.     Right Ear: External ear normal.     Left Ear: External ear  normal.     Mouth/Throat:     Pharynx: No oropharyngeal exudate or posterior oropharyngeal erythema.  Eyes:     General: No scleral icterus.       Right eye: No discharge.        Left eye: No discharge.     Conjunctiva/sclera: Conjunctivae normal.  Neck:  Thyroid : No thyromegaly.  Cardiovascular:     Rate and Rhythm: Normal rate and regular rhythm.  Pulmonary:     Effort: No respiratory distress.     Breath sounds: Normal breath sounds. No wheezing.  Abdominal:     General: Bowel sounds are normal.     Palpations: Abdomen is soft.     Tenderness: There is no abdominal tenderness.  Musculoskeletal:        General: No swelling or tenderness.     Cervical back: Neck supple. No tenderness.  Lymphadenopathy:     Cervical: No cervical adenopathy.  Skin:    Findings: No erythema or rash.  Neurological:     Mental Status: She is alert.  Psychiatric:        Mood and Affect: Mood normal.        Behavior: Behavior normal.         Outpatient Encounter Medications as of 06/23/2024  Medication Sig   albuterol  (VENTOLIN  HFA) 108 (90 Base) MCG/ACT inhaler INHALE 2 PUFFS INTO THE LUNGS EVERY 6 HOURS AS NEEDED FOR WHEEZING OR SHORTNESS OF BREATH.   alendronate  (FOSAMAX ) 70 MG tablet TAKE 1 TABLET EVERY 7 DAYS WITH A FULL GLASS OF WATER ON AN EMPTY STOMACH DO NOT LIE DOWN FOR AT LEAST 30 MIN   BIOTIN PO Take 10,000 mg by mouth daily.   butalbital -aspirin -caffeine  (FIORINAL ) 50-325-40 MG tablet Take 1 tablet by mouth daily as needed for headache.   Cholecalciferol (VITAMIN D3) 125 MCG (5000 UT) CAPS Take 5,000 Units by mouth daily.   EVENING PRIMROSE OIL PO Take 1,300 mg by mouth.   Glucosamine-Chondroitin (MOVE FREE PO) Take 1 capsule by mouth daily. Cartilage/boron/hyalauronic acid   ipratropium (ATROVENT ) 0.06 % nasal spray SPRAY TWICE INTO EACH NOSTRIL TWICE DAILY   LORazepam  (ATIVAN ) 0.5 MG tablet Take 1 tablet (0.5 mg total) by mouth daily as needed. for anxiety   MAGNESIUM  BISGLYCINATE PO Take 200 mg by mouth daily.   Multiple Minerals-Vitamins (CALCIUM  CITRATE PLUS/MAGNESIUM PO) Take 2 tablets by mouth 2 (two) times daily. CAL CITRATE 500-MAGNESIUM 80-ZINC-10   Omega-3 Fatty Acids (OMEGA-3 FISH OIL PO) Take 1,280 mg by mouth 2 (two) times daily.   pantoprazole  (PROTONIX ) 20 MG tablet TAKE 1 TABLET BY MOUTH TWICE A DAY BEFORE A MEAL.   propranolol  (INDERAL ) 20 MG tablet Take 1 tablet (20 mg total) by mouth daily.   Riboflavin (VITAMIN B2 PO) Take 250 mg by mouth daily.   rosuvastatin  (CRESTOR ) 10 MG tablet Take 1 tablet (10 mg total) by mouth daily.   sertraline  (ZOLOFT ) 50 MG tablet TAKE 1 TABLET BY MOUTH DAILY.   [DISCONTINUED] traZODone  (DESYREL ) 50 MG tablet TAKE 1 TABLET BY MOUTH NIGHTLY AS NEEDEDFOR SLEEP.   No facility-administered encounter medications on file as of 06/23/2024.     Lab Results  Component Value Date   WBC 7.0 07/15/2023   HGB 13.9 07/15/2023   HCT 39.8 07/15/2023   PLT 279 07/15/2023   GLUCOSE 89 06/19/2024   CHOL 203 (H) 06/19/2024   TRIG 53.0 06/19/2024   HDL 78.90 06/19/2024   LDLCALC 114 (H) 06/19/2024   ALT 25 06/19/2024   AST 30 06/19/2024   NA 137 06/19/2024   K 3.7 06/19/2024   CL 102 06/19/2024   CREATININE 0.74 06/19/2024   BUN 18 06/19/2024   CO2 30 06/19/2024   TSH 3.41 06/19/2024   INR 0.9 02/15/2023    MM Outside Films Mammo Result Date: 02/07/2024 This examination belongs to  an outside facility and is stored here for comparison purposes only.  Contact the originating outside institution for any associated report or interpretation.  MM Outside Films Mammo Result Date: 02/07/2024 This examination belongs to an outside facility and is stored here for comparison purposes only.  Contact the originating outside institution for any associated report or interpretation.  MM Outside Films Mammo Result Date: 02/07/2024 This examination belongs to an outside facility and is stored here for comparison purposes only.   Contact the originating outside institution for any associated report or interpretation.  MM Outside Films Mammo Result Date: 02/07/2024 This examination belongs to an outside facility and is stored here for comparison purposes only.  Contact the originating outside institution for any associated report or interpretation.  MM Outside Films Mammo Result Date: 02/07/2024 This examination belongs to an outside facility and is stored here for comparison purposes only.  Contact the originating outside institution for any associated report or interpretation.      Assessment & Plan:  Hypercholesterolemia Assessment & Plan: The 10-year ASCVD risk score (Arnett DK, et al., 2019) is: 8.3%   Values used to calculate the score:     Age: 62 years     Clincally relevant sex: Female     Is Non-Hispanic African American: No     Diabetic: No     Tobacco smoker: No     Systolic Blood Pressure: 108 mmHg     Is BP treated: No     HDL Cholesterol: 78.9 mg/dL     Total Cholesterol: 203 mg/dL  Low cholesterol diet and exercise. Discussed calculated cholesterol risk. Discussed starting cholesterol medication. Start crestor . Follow liver function tests.   Orders: -     Hepatic function panel; Future  Left hip pain Assessment & Plan: Persistent increased left hip pain as outlined. Has been to chiropractor. Exercises. Persistent pain. Check xray. Further w/up pending results.   Orders: -     DG HIP UNILAT W OR W/O PELVIS 2-3 VIEWS LEFT; Future  Abnormal liver function tests Assessment & Plan: Recent liver panel wnl. Follow liver function tests. Starting statin.    Anxiety Assessment & Plan: Continue zoloft . Overall appears to be handling things well. Follow.    Mild intermittent asthma, unspecified whether complicated Assessment & Plan: Breathing stable.    Gastroesophageal reflux disease, unspecified whether esophagitis present Assessment & Plan: Continue protonix . No upper symptoms  reported.    Paroxysmal SVT (supraventricular tachycardia) (HCC) Assessment & Plan: Off propranolol . Doing well. Follow.    Other orders -     Rosuvastatin  Calcium ; Take 1 tablet (10 mg total) by mouth daily.  Dispense: 30 tablet; Refill: 2     Allena Hamilton, MD

## 2024-06-23 NOTE — Assessment & Plan Note (Addendum)
 The 10-year ASCVD risk score (Arnett DK, et al., 2019) is: 8.3%   Values used to calculate the score:     Age: 73 years     Clincally relevant sex: Female     Is Non-Hispanic African American: No     Diabetic: No     Tobacco smoker: No     Systolic Blood Pressure: 108 mmHg     Is BP treated: No     HDL Cholesterol: 78.9 mg/dL     Total Cholesterol: 203 mg/dL  Low cholesterol diet and exercise. Discussed calculated cholesterol risk. Discussed starting cholesterol medication. Start crestor . Follow liver function tests.

## 2024-06-24 NOTE — Telephone Encounter (Signed)
Rx ok'd for trazodone.

## 2024-06-25 ENCOUNTER — Other Ambulatory Visit: Payer: Self-pay | Admitting: Internal Medicine

## 2024-06-25 ENCOUNTER — Encounter: Payer: Self-pay | Admitting: Physical Therapy

## 2024-06-25 ENCOUNTER — Ambulatory Visit: Attending: Internal Medicine | Admitting: Physical Therapy

## 2024-06-25 ENCOUNTER — Telehealth: Payer: Self-pay

## 2024-06-25 DIAGNOSIS — M533 Sacrococcygeal disorders, not elsewhere classified: Secondary | ICD-10-CM | POA: Diagnosis not present

## 2024-06-25 DIAGNOSIS — R2689 Other abnormalities of gait and mobility: Secondary | ICD-10-CM | POA: Diagnosis not present

## 2024-06-25 DIAGNOSIS — N8110 Cystocele, unspecified: Secondary | ICD-10-CM | POA: Insufficient documentation

## 2024-06-25 DIAGNOSIS — M25552 Pain in left hip: Secondary | ICD-10-CM | POA: Insufficient documentation

## 2024-06-25 DIAGNOSIS — G8929 Other chronic pain: Secondary | ICD-10-CM | POA: Diagnosis not present

## 2024-06-25 DIAGNOSIS — M25511 Pain in right shoulder: Secondary | ICD-10-CM | POA: Diagnosis not present

## 2024-06-25 NOTE — Patient Instructions (Signed)
 Avoid straining pelvic floor, abdominal muscles , spine  Use log rolling technique instead of getting out of bed with your neck or the sit-up   Log rolling into and out of bed  Log rolling into and out of bed If getting out of bed on R side, Bent knees, scoot hips/ shoulder to L  Raise R arm completely overhead, rolling onto armpit  Then lower bent knees to bed to get into complete side lying position  Then drop legs off bed, and push up onto R elbow/forearm, and use L hand to push onto the bed  __ Proper body mechanics with getting out of a chair to decrease strain  on back &pelvic floor   Avoid holding your breath when Getting out of the chair:  Scoot to front part of chair chair Heels behind knees, feet are hip width apart in a V ,   nose over toes  Inhale like you are smelling roses Exhale to stand  ___  Sitting with feet on ground, four points of contact Catch yourself crossing ankles and thighs  __

## 2024-06-25 NOTE — Telephone Encounter (Signed)
 Copied from CRM [id number]. Topic: General - Other >> Jun 25, 2024  1:42 PM Cindy Arnold wrote: Reason for CRM: Patient stated [facility] ask if they can get a disk of her xray that was taken on tuesday 07/01, she stated she can come pick it up when ready

## 2024-06-25 NOTE — Progress Notes (Signed)
Order placed for ortho referral.   

## 2024-06-25 NOTE — Therapy (Addendum)
 OUTPATIENT PHYSICAL THERAPY EVALUATION   Patient Name: Cindy Arnold MRN: [id number] DOB:August 10, 1951, 73 y.o., female Today's Date: 06/25/2024   PT End of Session - 06/25/24 0833     Visit Number 1    Number of Visits 10    Date for PT Re-Evaluation 09/03/24    PT Start Time 0802    PT Stop Time 0845    PT Time Calculation (min) 43 min    Activity Tolerance Patient tolerated treatment well;No increased pain    Behavior During Therapy WFL for tasks assessed/performed          Past Medical History:  Diagnosis Date   Basal cell carcinoma    multiple removed/[facility]   Breast cancer (HCC) 12/13   s/p right lumpectomy, s/p chemo and XRT   DES exposure in utero    Hepatitis A    History of chicken pox    Hyperactive airway disease    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HCC)    Migraines    Miscarriage    x3   Past Surgical History:  Procedure Laterality Date   ABDOMINAL HYSTERECTOMY     BASAL CELL CARCINOMA EXCISION     Multiple   BREAST BIOPSY  1975   Cyst   BREAST LUMPECTOMY WITH AXILLARY LYMPH NODE DISSECTION  01/06/13, 01/22/13   Dr. Georgaide, [facility], second surgery for pos margin   CERVICAL FUSION  2001   COLONOSCOPY     COLONOSCOPY WITH PROPOFOL  N/A 06/11/2022   Procedure: COLONOSCOPY WITH PROPOFOL ;  Surgeon: Unk Corinn Skiff, MD;  Location: [facility];  Service: Gastroenterology;  Laterality: N/A;   DILATION AND CURETTAGE OF UTERUS     x 3   LAPAROSCOPIC BILATERAL SALPINGO OOPHERECTOMY Bilateral 12/21/2020   Procedure: LAPAROSCOPIC BILATERAL SALPINGO OOPHORECTOMY;  Surgeon: Ward, Mitzie BROCKS, MD;  Location: [facility];  Service: Gynecology;  Laterality: Bilateral;   VAGINAL DELIVERY     x4   WISDOM TOOTH EXTRACTION     Patient Active Problem List   Diagnosis Date Noted   Vaginal prolapse 03/16/2024   Hypotension 03/11/2024   Mass of soft tissue of neck 10/27/2023   Neuropathy 06/23/2023   Shoulder pain 05/16/2023   Abnormal liver function tests  02/17/2023   Osteoarthritis of right shoulder 11/03/2022   Persistent cough 10/10/2022   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   Numbness of toes 07/10/2022   Vitamin D  deficiency 07/10/2022   Paroxysmal SVT (supraventricular tachycardia) (HCC) 06/17/2022   Polyp of ascending colon    Polyp of cecum    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   Hypercholesterolemia 10/05/2021   COVID-19 virus infection 06/18/2021   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   Macular atrophy, 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   Macular RPE mottling 06/07/2021   Radial styloid tenosynovitis 03/15/2021   Bursitis of shoulder 03/15/2021   Acquired trigger finger 03/15/2021   Increased heart rate 02/14/2021   Hypokalemia 11/22/2020   Urinary urgency 01/10/2020   Bilateral carpal tunnel syndrome 01/23/2019   Osteoarthritis of facet joint of lumbar spine 10/20/2018   Left hip pain 10/04/2018   GERD (gastroesophageal reflux disease) 08/10/2018   Numbness of right hand 08/10/2018   Cervical arthritis 03/20/2018   Neck pain 02/04/2018   Bronchiolitis 11/19/2017   Adhesive capsulitis of right shoulder 07/04/2017   Lateral epicondylitis 04/23/2017   History of colonic polyps 02/12/2017   Cough in adult patient 11/14/2015   Cough 10/14/2015   Palpitations 01/18/2015   Anxiety 01/18/2015   Healthcare maintenance 11/05/2013  Osteopenia 10/06/2013   History of breast cancer 12/03/2012   Screening for breast cancer 09/24/2012   Migraines 02/14/2012   Asthma 02/14/2012   Insomnia 02/14/2012    PCP: Glendia Shad MD   REFERRING PROVIDER: Glendia Shad MD   REFERRING DIAG: Vaginal prolapse   Rationale for Evaluation and Treatment Rehabilitation  THERAPY DIAG:  Sacrococcygeal disorders, not elsewhere classified  Other abnormalities of gait and mobility  Pain in left hip  Chronic right shoulder pain  ONSET DATE:   SUBJECTIVE:                                                                                                                                                                                            SUBJECTIVE STATEMENT on EVAL 06/25/24 :  1) Prolapse : Pt feels it is almost hanging outside 30% of the time. It occurred in Feb 2025.  It is difficult to finish urination and return a second time to finish urination 30% of the time.  Pt reports she has push it back up 4 x day. No difficulty with bowel movements.   2) R shoulder pain : started 1.5 years ago without injury. Not completed PT/. Had an MRI and was told it was arthritis in the joint. Drying hair is difficult due limited range 9/10.  Reaching in cabinets, she avoid using R hand and has to use L. Denied numbness and tingling down the arm   3) L hip pain: started in April without injury. Hurts with getting up and she has to adjust her position to not hurt.   It awakens her in the middle of the night.  Dull persistent ache  in the evening. 8/10 . Radiating to the side of the lateral of leg  in a random pattern electric sensation.   4) neuropathy in B feet occurs bottom and top of feet. Numbness constant  .  Pt wear arch support , Barefoot is not comfortable     PERTINENT HISTORY:  Breast biopsy  Date Unknown Abdominal hysterectomy Date Unknown Basal cell carcinoma excision  01/06/13, 01/22/13 Breast lumpectomy with axillary lymph node dissection     PAIN:  Are you having pain? Yes see above   PRECAUTIONS: None  WEIGHT BEARING RESTRICTIONS: No  FALLS:  Has patient fallen in last 6 months? No  LIVING ENVIRONMENT: Lives with: husband  Lives in: 2 story  Stairs: 1 STE w/o rail  Has following equipment at home: No   OCCUPATION:  retired from [employer] for [employer]   PLOF: Independent  PATIENT GOALS:   To be able to stay active, become active ( was  walking 3 x a week 3 miles), gardening, paying with grandchilden    OBJECTIVE:   [facility] PT Assessment - 06/25/24 0815        Observation/Other Assessments   Observations crossed legs      Floor to Stand   Comments poor body mechanics, alignment of LKC narrow,  breathholding , rising from posterior COM      AROM   Overall AROM Comments L sideflexion/ R digit III to floor: 56 cmL, 59 cm R  , limited R rotation  > L      Palpation   SI assessment  L shoulder. R iliac crest lowered      Ambulation/Gait   Gait Comments L pelvic lateral shift, decreased R stance, limited R trunk posterior rotation            [facility] Adult PT Treatment/Exercise - 06/25/24 0815       Self-Care   Self-Care Other Self-Care Comments    Other Self-Care Comments  explained regional interdependent approach to address pain at different body parts, explained upcoming visits to realign posture and spine.pelvis to improve Sx and achieve goals, showed anatomy images of deep core system ,      Therapeutic Activites    Therapeutic Activities Other Therapeutic Activities      Neuro Re-ed    Neuro Re-ed Details  cued for body mechanics to minimize straining pelvic floor and spine              HOME EXERCISE PROGRAM: See pt instruction section    ASSESSMENT:  CLINICAL IMPRESSION: Pt is a  73  yo  who presents with the following issues which impact QOL, ADL,  fitness, social and community activities:    1) Prolapse : Pt feels it is almost hanging outside 30% of the time. It occurred in Feb 2025.    2) R shoulder pain : started 1.5 years ago without injury. Not completed PT/. Had an MRI and was told it was arthritis in the joint. Drying hair is difficult due limited range 9/10.  Reaching in cabinets, she avoid using R hand and has to use L. Denied numbness and tingling down the arm   3) L hip pain: started in April without injury. Hurts with getting up and she has to adjust her position to not hurt.   It awakens her in the middle of the night.  Dull persistent ache  in the evening. 8/10 . Radiating to the side of the lateral of  leg  in a random pattern electric sensation.   4) neuropathy in B feet occurs bottom and top of feet. Numbness constant  .  Pt wear arch support , Barefoot is not comfortable   Pt's musculoskeletal assessment revealed uneven pelvic girdle and shoulder height, asymmetries to gait pattern, limited spinal /pelvic mobility, dyscoordination and strength of pelvic floor mm, hip weakness, poor body mechanics which places strain on the abdominal/pelvic floor mm. These are deficits that indicate an ineffective intraabdominal pressure system associated with increased risk for pt's Sx.     Pt will benefit from propioception/ coordination/ body mechanics training and education with gravity-loaded tasks with lifting grandchildren , ADLs, and  fitness modifications in order to gain a more effective intraabdominal pressure system to minimize Sx.   Pt was provided education on etiology of Sx with anatomy, physiology explanation with images along with the benefits of customized pelvic PT Tx based on pt's medical conditions and musculoskeletal deficits.  Explained the physiology of deep  core mm coordination and roles of pelvic floor function in urination, defecation, sexual function, and postural control with deep core mm system, and the role of posture and alignment to help pelvic issues.    Regional interdependent approaches will yield greater benefits in pt's POC .  Following Tx today which pt tolerated without complaints,  pt demo'd proper body mechanics to minimize straining pelvic floor.  Plan to address realignment of spine/ pelvis at next session to help promote optimize IAP system for improved pelvic floor function, trunk stability, gait, balance, stabilization with mobility tasks.  Plan to address pelvic floor issues once pelvis and spine are realigned to yield better outcomes.                                                       Pt benefits from skilled PT.    OBJECTIVE IMPAIRMENTS decreased  activity tolerance, decreased coordination, decreased endurance, decreased mobility, difficulty walking, decreased ROM, decreased strength, decreased safety awareness, hypomobility, increased muscle spasms, impaired flexibility, improper body mechanics, postural dysfunction, and pain. scar restrictions   ACTIVITY LIMITATIONS  self-care,  sleep, home chores, work tasks    PARTICIPATION LIMITATIONS:  community, caring for grandchildren activities    PERSONAL FACTORS   affecting patient's functional outcome:    REHAB POTENTIAL: Good   CLINICAL DECISION MAKING: Evolving/moderate complexity   EVALUATION COMPLEXITY: Moderate    PATIENT EDUCATION:    Education details: Showed pt anatomy images. Explained muscles attachments/ connection, physiology of deep core system/ spinal- thoracic-pelvis-lower kinetic chain as they relate to pt's presentation, Sx, and past Hx. Explained what and how these areas of deficits need to be restored to balance and function    See Therapeutic activity / neuromuscular re-education section  Answered pt's questions.   Person educated: Patient Education method: Explanation, Demonstration, Tactile cues, Verbal cues, and Handouts Education comprehension: verbalized understanding, returned demonstration, verbal cues required, tactile cues required, and needs further education     PLAN: PT FREQUENCY: 1x/week   PT DURATION: 10 weeks   PLANNED INTERVENTIONS:   Gait training;Stair training;Functional mobility training;DME Instruction;Therapeutic activities;Therapeutic exercise;Balance training;Neuromuscular re-education;Patient/family education;Vestibular;Visual/perceptual remediation/compensation;Passive range of motion;Moist Heat;Cryotherapy;Traction;Canalith Repostioning;Joint Manipulations;Manual lymph drainage;Manual techniques;Scar mobilization;Energy conservation;Dry needling;ADLs/Self Care Home Management;Biofeedback;Electrical Stimulation;Taping    PLAN FOR  NEXT SESSION: See clinical impression for plan     GOALS: Goals reviewed with patient? Yes  SHORT TERM GOALS: Target date: 07/23/2024    Pt will demo IND with HEP                    Baseline: Not IND            Goal status: INITIAL   LONG TERM GOALS: Target date: 09/03/2024    1.Pt will demo proper deep core coordination without chest breathing and optimal excursion of diaphragm/pelvic floor in order to promote spinal stability and pelvic floor function  Baseline: dyscoordination Goal status: INITIAL  2.  Pt will demo proper body mechanics in against gravity tasks and ADLs  work tasks, fitness  to minimize straining pelvic floor / back    Baseline: not IND, improper form that places strain on pelvic floor  Goal status: INITIAL    3. Pt will demo increased gait speed  with reciprocal gait pattern, longer stride length  in order to ambulate safely in community and  return to fitness routine  Baseline:  L pelvic lateral shift, decreased R stance, limited R trunk posterior rotation  Goal status: INITIAL    4. Pt will demo levelled pelvic girdle and shoulder height  and demo B sideflexion measurements   in order to progress to deep core strengthening HEP and restore mobility at spine, pelvis, gait, posture minimize falls, and improve balance  Baseline:L shoulder. R iliac crest lowered ,  L sideflexion/ R digit III to floor: 56 cmL, 59 cm R  , limited R rotation  > L  Goal status: INITIAL   5. Pt will improve PFDI-7 questionnaire to  pts  score change  to demo improved QOL  Baseline:    ( greater pts indicate greater negative impact on QOL)   83  pts  ( total)  24   pts  ( UIQ-7 )  10   pts  ( CRAIQ-7 )  10  pts  ( POPIQ-7 )  Baseline:  Goal status: INITIAL  6.  Pt will report complete urination without needing to return to toilet a second time > 50% of the time and/or pt reports she has to push prolapse up < 2x day to improve hygiene and function  Baseline: It is  difficult to finish urination and return a second time to finish urination 30% of the time.  Pt reports she has push it back up 4 x day.  Goal status: INITIAL    7. Pt will demo equal alignment of shoulder and improved AROM with overhead reaching, and able to dry hair with less pain < 5/10  Baseline:  Drying hair is difficult due limited range 9/10.  Reaching in cabinets, she avoid using R hand and has to use L.  Goal status: INITIAL  8. Pt reports L hip pain no longer awakens her at night to improve quality of sleep. Pt reports no more radiating L pain across 1 month  Baseline:  It awakens her in the middle of the night.  Dull persistent ache  in the evening. 8/10 . Radiating to the side of the lateral of leg  in a random pattern electric sensation.  Goal Status: INITIAL   9.  Baseline: bottom and top of feet. Numbness constant  .  Pt wear arch support , Barefoot is not comfortable  Goal Status: INITIAL   Pia Lupe Plump, PT 06/25/2024, 8:39 AM

## 2024-06-28 NOTE — Assessment & Plan Note (Signed)
 Recent liver panel wnl. Follow liver function tests. Starting statin.

## 2024-06-28 NOTE — Assessment & Plan Note (Signed)
 Breathing stable.

## 2024-06-28 NOTE — Assessment & Plan Note (Signed)
 Off propranolol . Doing well. Follow.

## 2024-06-28 NOTE — Assessment & Plan Note (Signed)
Continue zoloft.  Overall appears to be handling things well.  Follow.

## 2024-06-28 NOTE — Assessment & Plan Note (Signed)
Continue protonix.  No upper symptoms reported.

## 2024-06-28 NOTE — Assessment & Plan Note (Signed)
 Persistent increased left hip pain as outlined. Has been to chiropractor. Exercises. Persistent pain. Check xray. Further w/up pending results.

## 2024-06-29 ENCOUNTER — Ambulatory Visit: Admitting: Physical Therapy

## 2024-06-29 DIAGNOSIS — M25552 Pain in left hip: Secondary | ICD-10-CM

## 2024-06-29 DIAGNOSIS — M533 Sacrococcygeal disorders, not elsewhere classified: Secondary | ICD-10-CM | POA: Diagnosis not present

## 2024-06-29 DIAGNOSIS — H35052 Retinal neovascularization, unspecified, left eye: Secondary | ICD-10-CM | POA: Diagnosis not present

## 2024-06-29 DIAGNOSIS — R2689 Other abnormalities of gait and mobility: Secondary | ICD-10-CM

## 2024-06-29 DIAGNOSIS — H3589 Other specified retinal disorders: Secondary | ICD-10-CM | POA: Diagnosis not present

## 2024-06-29 DIAGNOSIS — G8929 Other chronic pain: Secondary | ICD-10-CM

## 2024-06-29 NOTE — Therapy (Addendum)
 OUTPATIENT PHYSICAL THERAPY  TREATMENT    Patient Name: Cindy Arnold MRN: [id number] DOB:[birth year], 73 y.o., female Today's Date: 06/29/2024   PT End of Session - 06/29/24 0947     Visit Number 2    Number of Visits 10    Date for PT Re-Evaluation 09/03/24    PT Start Time 0940    PT Stop Time 1020    PT Time Calculation (min) 40 min    Activity Tolerance Patient tolerated treatment well;No increased pain    Behavior During Therapy WFL for tasks assessed/performed          Past Medical History:  Diagnosis Date   Basal cell carcinoma    multiple removed/[facility]   Breast cancer (HCC) 12/13   s/p right lumpectomy, s/p chemo and XRT   DES exposure in utero    Hepatitis A    History of chicken pox    Hyperactive airway disease    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HCC)    Migraines    Miscarriage    x3   Past Surgical History:  Procedure Laterality Date   ABDOMINAL HYSTERECTOMY     BASAL CELL CARCINOMA EXCISION     Multiple   BREAST BIOPSY  1975   Cyst   BREAST LUMPECTOMY WITH AXILLARY LYMPH NODE DISSECTION  01/06/13, 01/22/13   Dr. Georgaide, [facility], second surgery for pos margin   CERVICAL FUSION  2001   COLONOSCOPY     COLONOSCOPY WITH PROPOFOL  N/A 06/11/2022   Procedure: COLONOSCOPY WITH PROPOFOL ;  Surgeon: Unk Corinn Skiff, MD;  Location: [facility];  Service: Gastroenterology;  Laterality: N/A;   DILATION AND CURETTAGE OF UTERUS     x 3   LAPAROSCOPIC BILATERAL SALPINGO OOPHERECTOMY Bilateral 12/21/2020   Procedure: LAPAROSCOPIC BILATERAL SALPINGO OOPHORECTOMY;  Surgeon: Ward, Mitzie BROCKS, MD;  Location: [facility];  Service: Gynecology;  Laterality: Bilateral;   VAGINAL DELIVERY     x4   WISDOM TOOTH EXTRACTION     Patient Active Problem List   Diagnosis Date Noted   Vaginal prolapse 03/16/2024   Hypotension 03/11/2024   Mass of soft tissue of neck 10/27/2023   Neuropathy 06/23/2023   Shoulder pain 05/16/2023   Abnormal liver function tests  02/17/2023   Osteoarthritis of right shoulder 11/03/2022   Persistent cough 10/10/2022   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   Numbness of toes 07/10/2022   Vitamin D  deficiency 07/10/2022   Paroxysmal SVT (supraventricular tachycardia) (HCC) 06/17/2022   Polyp of ascending colon    Polyp of cecum    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   Hypercholesterolemia 10/05/2021   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   Macular atrophy, 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   Macular RPE mottling 06/07/2021   Radial styloid tenosynovitis 03/15/2021   Bursitis of shoulder 03/15/2021   Acquired trigger finger 03/15/2021   Increased heart rate 02/14/2021   Hypokalemia 11/22/2020   Urinary urgency 01/10/2020   Bilateral carpal tunnel syndrome 01/23/2019   Osteoarthritis of facet joint of lumbar spine 10/20/2018   Left hip pain 10/04/2018   GERD (gastroesophageal reflux disease) 08/10/2018   Numbness of right hand 08/10/2018   Cervical arthritis 03/20/2018   Neck pain 02/04/2018   Bronchiolitis 11/19/2017   Adhesive capsulitis of right shoulder 07/04/2017   Lateral epicondylitis 04/23/2017   History of colonic polyps 02/12/2017   Cough in adult patient 11/14/2015   Cough 10/14/2015   Palpitations 01/18/2015   Anxiety 01/18/2015   Healthcare maintenance 11/05/2013   Osteopenia 10/06/2013  History of breast cancer 12/03/2012   Screening for breast cancer 09/24/2012   Migraines 02/14/2012   Asthma 02/14/2012   Insomnia 02/14/2012    PCP: Glendia Shad MD   REFERRING PROVIDER: Glendia Shad MD   REFERRING DIAG: Vaginal prolapse   Rationale for Evaluation and Treatment Rehabilitation  THERAPY DIAG:  Sacrococcygeal disorders, not elsewhere classified  Other abnormalities of gait and mobility  Pain in left hip  Chronic right shoulder pain  ONSET DATE:   SUBJECTIVE:       SUBJECTIVE STATEMENT  Pt noticed she had to only push prolapse back up 2 x day instead of 4 x  day since last session.  Pt has been catching herself not crossing her legs. Pt noticed this at church yesterday                                                                                                                                                                                       SUBJECTIVE STATEMENT on EVAL 06/25/24 :  1) Prolapse : Pt feels it is almost hanging outside 30% of the time. It occurred in Feb 2025.  It is difficult to finish urination and return a second time to finish urination 30% of the time.  Pt reports she has push it back up 4 x day. No difficulty with bowel movements.   2) R shoulder pain : started 1.5 years ago without injury. Not completed PT/. Had an MRI and was told it was arthritis in the joint. Drying hair is difficult due limited range 9/10.  Reaching in cabinets, she avoid using R hand and has to use L. Denied numbness and tingling down the arm   3) L hip pain: started in April without injury. Hurts with getting up and she has to adjust her position to not hurt.   It awakens her in the middle of the night.  Dull persistent ache  in the evening. 8/10 . Radiating to the side of the lateral of leg  in a random pattern electric sensation.   4) neuropathy in B feet occurs bottom and top of feet. Numbness constant  .  Pt wear arch support , Barefoot is not comfortable     PERTINENT HISTORY:  Breast biopsy  Date Unknown Abdominal hysterectomy Date Unknown Basal cell carcinoma excision  01/06/13, 01/22/13 Breast lumpectomy with axillary lymph node dissection     PAIN:  Are you having pain? Yes see above   PRECAUTIONS: None  WEIGHT BEARING RESTRICTIONS: No  FALLS:  Has patient fallen in last 6 months? 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 forefoor, and plantar felt numb and rubbery      AROM   Overall AROM Comments OKC: B 78 deg,   post Tx: 74 deg R, 70 deg L      Palpation   SI assessment  pelvic levelled, L shoulder slightly lowered    Palpation comment tightness along plantar fascia, limited toe abd digit II-III, hypomobile midfoot joints,. tib-fib/ ant tib tightness  on R            [facility] Adult PT Treatment/Exercise - 06/29/24 1016       Self-Care   Other Self-Care Comments  explained feet and alignment of knees to help pelvis issues and nubness in feet issues      Neuro Re-ed    Neuro Re-ed Details  cued for DF/EV, toe abd with knee alignment in ER/abd with propioception training, cued for knee alignment and less adducted in sitting      Manual Therapy   Manual therapy comments STM/MWM/ PA/AP mob Grade III along areas noted in assessment to promote mobility of feet, toe abduc, ER of tibia. knee  on R               HOME EXERCISE PROGRAM: See pt instruction section    ASSESSMENT:  CLINICAL IMPRESSION:   Improvements include:   Pt showed levelled pelvis today as pt has been practicing not crossing legs.  Pt noticed she had to only push prolapse back up 2 x day instead of 4 x day since last session.     Focused on feet numbness issues today.  Manual Tx was applied with lighter pressure when painful. Pt tolerated Tx and showed increased DF AROM and more ER of tibia/ toe abduction/ increased plantar fascia mobility. Cued for gait mechanics to minimize adducted knees/ supination.   Cued fro sitting posture with knee  propioception and alignment to minimize adducted knees. Explained LKC deficits contributing to pelvic floor, B feet numbness, L hip pain.     Regional interdependent approaches will yield greater benefits in pt's POC .  These improvements will help promote optimize IAP system for improved pelvic floor function, trunk stability, gait, balance, stabilization with mobility tasks.  Plan to address pelvic floor issues once B feet numbness / LKC deficits improves  to yield better outcomes.                                                       Pt benefits from skilled PT.    OBJECTIVE IMPAIRMENTS decreased activity tolerance, decreased coordination, decreased endurance, decreased mobility, difficulty walking, decreased ROM, decreased strength, decreased safety awareness, hypomobility, increased muscle spasms, impaired flexibility, improper body mechanics, postural dysfunction, and pain. scar restrictions  ACTIVITY LIMITATIONS  self-care,  sleep, home chores, work tasks    PARTICIPATION LIMITATIONS:  community, caring for grandchildren activities    PERSONAL FACTORS   affecting patient's functional outcome:    REHAB POTENTIAL: Good   CLINICAL DECISION MAKING: Evolving/moderate complexity   EVALUATION COMPLEXITY: Moderate    PATIENT EDUCATION:    Education details: Showed pt anatomy images. Explained muscles attachments/ connection, physiology of deep core system/ spinal- thoracic-pelvis-lower kinetic chain as they relate to pt's presentation, Sx, and past Hx. Explained what and how these areas of deficits need to be restored to balance and function    See Therapeutic activity / neuromuscular re-education section  Answered pt's questions.   Person educated: Patient Education method: Explanation, Demonstration, Tactile cues, Verbal cues, and Handouts Education comprehension: verbalized understanding, returned demonstration, verbal cues required, tactile cues required, and needs further  education     PLAN: PT FREQUENCY: 1x/week   PT DURATION: 10 weeks   PLANNED INTERVENTIONS:   Gait training;Stair training;Functional mobility training;DME Instruction;Therapeutic activities;Therapeutic exercise;Balance training;Neuromuscular re-education;Patient/family education;Vestibular;Visual/perceptual remediation/compensation;Passive range of motion;Moist Heat;Cryotherapy;Traction;Canalith Repostioning;Joint Manipulations;Manual lymph drainage;Manual techniques;Scar mobilization;Energy conservation;Dry needling;ADLs/Self Care Home Management;Biofeedback;Electrical Stimulation;Taping    PLAN FOR NEXT SESSION: See clinical impression for plan     GOALS: Goals reviewed with patient? Yes  SHORT TERM GOALS: Target date: 07/23/2024    Pt will demo IND with HEP                    Baseline: Not IND            Goal status: INITIAL   LONG TERM GOALS: Target date: 09/03/2024    1.Pt will demo proper deep core coordination without chest breathing and optimal excursion of diaphragm/pelvic floor in order to promote spinal stability and pelvic floor function  Baseline: dyscoordination Goal status: INITIAL  2.  Pt will demo proper body mechanics in against gravity tasks and ADLs  work tasks, fitness  to minimize straining pelvic floor / back    Baseline: not IND, improper form that places strain on pelvic floor  Goal status: INITIAL    3. Pt will demo increased gait speed  with reciprocal gait pattern, longer stride length  in order to ambulate safely in community and return to fitness routine  Baseline:  L pelvic lateral shift, decreased R stance, limited R trunk posterior rotation  Goal status: INITIAL    4. Pt will demo levelled pelvic girdle and shoulder height  and demo B sideflexion measurements   in order to progress to deep core strengthening HEP and restore mobility at spine, pelvis, gait, posture minimize falls, and improve balance  Baseline:L shoulder. R iliac crest  lowered ,  L sideflexion/ R digit III to floor: 56 cmL, 59 cm R  , limited R rotation  > L  Goal status: INITIAL   5. Pt will improve PFDI-7 questionnaire to  pts  score change  to demo improved QOL  Baseline:    ( greater pts indicate greater negative impact on QOL)   83  pts  ( total)  24   pts  ( UIQ-7 )  10   pts  ( CRAIQ-7 )  10  pts  ( POPIQ-7 )  Baseline:  Goal status: INITIAL  6.  Pt will report complete urination without needing to return to toilet a second time > 50% of the time and/or pt reports she has to push prolapse up < 2x day to improve hygiene and function  Baseline:  It is difficult to finish urination and return a second time to finish urination 30% of the time.  Pt reports she has push it back up 4 x day.  Goal status: INITIAL    7. Pt will demo equal alignment of shoulder and improved AROM with overhead reaching, and able to dry hair with less pain < 5/10  Baseline:  Drying hair is difficult due limited range 9/10.  Reaching in cabinets, she avoid using R hand and has to use L.  Goal status: INITIAL  8. Pt reports L hip pain no longer awakens her at night to improve quality of sleep. Pt reports no more radiating L pain across 1 month  Baseline:  It awakens her in the middle of the night.  Dull persistent ache  in the evening. 8/10 . Radiating to the side of the lateral of leg  in a random pattern electric sensation.  Goal Status: INITIAL   9.  Baseline: bottom and top of feet. Numbness constant  .  Pt wear arch support , Barefoot is not comfortable  Goal Status: INITIAL   Pia Lupe Plump, PT 06/29/2024, 9:47 AM

## 2024-06-29 NOTE — Patient Instructions (Signed)
   Feet slides :   Points of contact at sitting bones  Four points of contact of foot,  Side knee back while keeping knee out along 2-3rd toe line   Heel up, ankle not twist out Lower heel while keeping knee out along 2-3rd toe line Four points of contact of foot, Slide foot back while keeping knee out along 2-3rd toe line   Repeated with other foot   2-3 min   __

## 2024-06-30 DIAGNOSIS — L923 Foreign body granuloma of the skin and subcutaneous tissue: Secondary | ICD-10-CM | POA: Diagnosis not present

## 2024-06-30 DIAGNOSIS — H40033 Anatomical narrow angle, bilateral: Secondary | ICD-10-CM | POA: Diagnosis not present

## 2024-06-30 DIAGNOSIS — H2513 Age-related nuclear cataract, bilateral: Secondary | ICD-10-CM | POA: Diagnosis not present

## 2024-07-08 ENCOUNTER — Ambulatory Visit: Admitting: Physical Therapy

## 2024-07-08 DIAGNOSIS — M533 Sacrococcygeal disorders, not elsewhere classified: Secondary | ICD-10-CM

## 2024-07-08 DIAGNOSIS — R2689 Other abnormalities of gait and mobility: Secondary | ICD-10-CM

## 2024-07-08 DIAGNOSIS — G8929 Other chronic pain: Secondary | ICD-10-CM

## 2024-07-08 DIAGNOSIS — M25552 Pain in left hip: Secondary | ICD-10-CM

## 2024-07-08 NOTE — Patient Instructions (Signed)
 Minisquat: Scoot buttocks back slight, hinge like you are looking at your reflection on a pond  Knees behind toes,  Inhale to "smell flowers"  Exhale on the rise "like rocket"  Do not lock knees, have more weight across ballmounds of feet, toes relaxed and spread them, not grip them   10 reps x 3 x day   __

## 2024-07-08 NOTE — Therapy (Signed)
 OUTPATIENT PHYSICAL THERAPY  TREATMENT    Patient Name: Cindy Arnold MRN: [id number] DOB:21-Feb-1951, 73 y.o., female Today's Date: 07/08/2024   PT End of Session - 07/08/24 1639     Visit Number 3    Number of Visits 10    Date for PT Re-Evaluation 09/03/24    PT Start Time 1635    PT Stop Time 1715    PT Time Calculation (min) 40 min    Activity Tolerance Patient tolerated treatment well;No increased pain    Behavior During Therapy WFL for tasks assessed/performed          Past Medical History:  Diagnosis Date   Basal cell carcinoma    multiple removed/[facility]   Breast cancer (HCC) 12/13   s/p right lumpectomy, s/p chemo and XRT   DES exposure in utero    Hepatitis A    History of chicken pox    Hyperactive airway disease    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HCC)    Migraines    Miscarriage    x3   Past Surgical History:  Procedure Laterality Date   ABDOMINAL HYSTERECTOMY     BASAL CELL CARCINOMA EXCISION     Multiple   BREAST BIOPSY  1975   Cyst   BREAST LUMPECTOMY WITH AXILLARY LYMPH NODE DISSECTION  01/06/13, 01/22/13   Dr. Georgaide, [facility], second surgery for pos margin   CERVICAL FUSION  2001   COLONOSCOPY     COLONOSCOPY WITH PROPOFOL  N/A 06/11/2022   Procedure: COLONOSCOPY WITH PROPOFOL ;  Surgeon: Unk Corinn Skiff, MD;  Location: [facility];  Service: Gastroenterology;  Laterality: N/A;   DILATION AND CURETTAGE OF UTERUS     x 3   LAPAROSCOPIC BILATERAL SALPINGO OOPHERECTOMY Bilateral 12/21/2020   Procedure: LAPAROSCOPIC BILATERAL SALPINGO OOPHORECTOMY;  Surgeon: Ward, Mitzie BROCKS, MD;  Location: [facility] ORS;  Service: Gynecology;  Laterality: Bilateral;   VAGINAL DELIVERY     x4   WISDOM TOOTH EXTRACTION     Patient Active Problem List   Diagnosis Date Noted   Vaginal prolapse 03/16/2024   Hypotension 03/11/2024   Mass of soft tissue of neck 10/27/2023   Neuropathy 06/23/2023   Shoulder pain 05/16/2023   Abnormal liver function tests  02/17/2023   Osteoarthritis of right shoulder 11/03/2022   Persistent cough 10/10/2022   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   Numbness of toes 07/10/2022   Vitamin D  deficiency 07/10/2022   Paroxysmal SVT (supraventricular tachycardia) (HCC) 06/17/2022   Polyp of ascending colon    Polyp of cecum    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   Hypercholesterolemia 10/05/2021   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   Macular atrophy, 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   Macular RPE mottling 06/07/2021   Radial styloid tenosynovitis 03/15/2021   Bursitis of shoulder 03/15/2021   Acquired trigger finger 03/15/2021   Increased heart rate 02/14/2021   Hypokalemia 11/22/2020   Urinary urgency 01/10/2020   Bilateral carpal tunnel syndrome 01/23/2019   Osteoarthritis of facet joint of lumbar spine 10/20/2018   Left hip pain 10/04/2018   GERD (gastroesophageal reflux disease) 08/10/2018   Numbness of right hand 08/10/2018   Cervical arthritis 03/20/2018   Neck pain 02/04/2018   Bronchiolitis 11/19/2017   Adhesive capsulitis of right shoulder 07/04/2017   Lateral epicondylitis 04/23/2017   History of colonic polyps 02/12/2017   Cough in adult patient 11/14/2015   Cough 10/14/2015   Palpitations 01/18/2015   Anxiety 01/18/2015   Healthcare maintenance 11/05/2013   Osteopenia 10/06/2013  History of breast cancer 12/03/2012   Screening for breast cancer 09/24/2012   Migraines 02/14/2012   Asthma 02/14/2012   Insomnia 02/14/2012    PCP: Glendia Shad MD   REFERRING PROVIDER: Glendia Shad MD   REFERRING DIAG: Vaginal prolapse   Rationale for Evaluation and Treatment Rehabilitation  THERAPY DIAG:  Sacrococcygeal disorders, not elsewhere classified  Other abnormalities of gait and mobility  Pain in left hip  Chronic right shoulder pain  ONSET DATE:   SUBJECTIVE:       SUBJECTIVE STATEMENT  Pt reported hit her arm on a corner of a cabinet and got a bruise on upper  R arm.   Pt is able to walk barefoot with a little more comfort since last session. Pt is confused about her HEP from last session with feet slides                                                                                                                                                                      SUBJECTIVE STATEMENT on EVAL 06/25/24 :  1) Prolapse : Pt feels it is almost hanging outside 30% of the time. It occurred in Feb 2025.  It is difficult to finish urination and return a second time to finish urination 30% of the time.  Pt reports she has push it back up 4 x day. No difficulty with bowel movements.   2) R shoulder pain : started 1.5 years ago without injury. Not completed PT/. Had an MRI and was told it was arthritis in the joint. Drying hair is difficult due limited range 9/10.  Reaching in cabinets, she avoid using R hand and has to use L. Denied numbness and tingling down the arm   3) L hip pain: started in April without injury. Hurts with getting up and she has to adjust her position to not hurt.   It awakens her in the middle of the night.  Dull persistent ache  in the evening. 8/10 . Radiating to the side of the lateral of leg  in a random pattern electric sensation.   4) neuropathy in B feet occurs bottom and top of feet. Numbness constant  .  Pt wear arch support , Barefoot is not comfortable     PERTINENT HISTORY:  Breast biopsy  Date Unknown Abdominal hysterectomy Date Unknown Basal cell carcinoma excision  01/06/13, 01/22/13 Breast lumpectomy with axillary lymph node dissection     PAIN:  Are you having pain? Yes see above   PRECAUTIONS: None  WEIGHT BEARING RESTRICTIONS: No  FALLS:  Has patient fallen in last 6 months? 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 tib-fib/ ant tib tightness  L,          [facility] Adult PT Treatment/Exercise - 07/08/24 1717       Neuro Re-ed    Neuro Re-ed Details  cued for DF/EV, knee alignment for sit to stand to avoid adducted knees which help hip pain,  cued for mini squat propicoeption      Manual Therapy   Manual therapy comments STM/MWM/ PA/AP mob Grade III along areas noted in assessment to promote mobility of feet, toe abduct, ER of tibia. knee             HOME EXERCISE PROGRAM: See pt instruction section    ASSESSMENT:  CLINICAL IMPRESSION:   Improvements include:   Pt showed levelled pelvis today as pt has been practicing not crossing legs.   Pt noticed she had to only push prolapse back up 2 x day instead of 4 x day the week after eval visit.    Pt tolerated Tx and showed increased DF AROM and more ER of tibia/ toe abduction/ increased plantar fascia mobility along L foot .  R foot showed good carry over with last Tx manual's Tx to improve mobility.    Cued fro sitting posture with knee propioception and alignment to minimize adducted knees.  Pt demo'd more abducted knees with sit to stand after manual Tx with regained DF/EV and ER of tibia/ hip.   Advised to remove orthotics to minimize high and still arches.  Provided propioceptive inserts to improve feet mobility and sensory awareness.  Added mini squats. Required cues for proper alignment and technique which will help with hip alignment and knee.   Explained LKC deficits contributing to pelvic floor, B feet numbness, L hip pain.    Regional interdependent approaches will yield greater  benefits in pt's POC .  These improvements will help promote optimize IAP system for improved pelvic floor function, trunk stability, gait, balance, stabilization with mobility tasks.  Plan to address pelvic floor issues once B feet numbness / LKC deficits improves  to yield better outcomes.                                                       Pt benefits from skilled PT.    OBJECTIVE IMPAIRMENTS decreased activity tolerance, decreased coordination, decreased endurance, decreased mobility, difficulty walking, decreased ROM, decreased strength, decreased safety awareness, hypomobility, increased muscle spasms, impaired flexibility, improper body mechanics, postural dysfunction, and pain. scar restrictions   ACTIVITY LIMITATIONS  self-care,  sleep, home chores, work tasks    PARTICIPATION LIMITATIONS:  community, caring for grandchildren activities    PERSONAL FACTORS   affecting patient's functional outcome:    REHAB POTENTIAL: Good   CLINICAL DECISION MAKING: Evolving/moderate complexity   EVALUATION COMPLEXITY: Moderate    PATIENT EDUCATION:  Education details: Showed pt anatomy images. Explained muscles attachments/ connection, physiology of deep core system/ spinal- thoracic-pelvis-lower kinetic chain as they relate to pt's presentation, Sx, and past Hx. Explained what and how these areas of deficits need to be restored to balance and function    See Therapeutic activity / neuromuscular re-education section  Answered pt's questions.   Person educated: Patient Education method: Explanation, Demonstration, Tactile cues, Verbal cues, and Handouts Education comprehension: verbalized understanding, returned demonstration, verbal cues required, tactile cues required, and needs further education     PLAN: PT FREQUENCY: 1x/week   PT DURATION: 10 weeks   PLANNED INTERVENTIONS:   Gait training;Stair training;Functional mobility training;DME Instruction;Therapeutic  activities;Therapeutic exercise;Balance training;Neuromuscular re-education;Patient/family education;Vestibular;Visual/perceptual remediation/compensation;Passive range of motion;Moist Heat;Cryotherapy;Traction;Canalith Repostioning;Joint Manipulations;Manual lymph drainage;Manual techniques;Scar mobilization;Energy conservation;Dry needling;ADLs/Self Care Home Management;Biofeedback;Electrical Stimulation;Taping    PLAN FOR NEXT SESSION: See clinical impression for plan     GOALS: Goals reviewed with patient? Yes  SHORT TERM GOALS: Target date: 07/23/2024    Pt will demo IND with HEP                    Baseline: Not IND            Goal status: INITIAL   LONG TERM GOALS: Target date: 09/03/2024    1.Pt will demo proper deep core coordination without chest breathing and optimal excursion of diaphragm/pelvic floor in order to promote spinal stability and pelvic floor function  Baseline: dyscoordination Goal status: INITIAL  2.  Pt will demo proper body mechanics in against gravity tasks and ADLs  work tasks, fitness  to minimize straining pelvic floor / back    Baseline: not IND, improper form that places strain on pelvic floor  Goal status: INITIAL    3. Pt will demo increased gait speed  with reciprocal gait pattern, longer stride length  in order to ambulate safely in community and return to fitness routine  Baseline:  L pelvic lateral shift, decreased R stance, limited R trunk posterior rotation  Goal status:MET  ( 07/08/24 MET)     4. Pt will demo levelled pelvic girdle and shoulder height  and demo B sideflexion measurements   in order to progress to deep core strengthening HEP and restore mobility at spine, pelvis, gait, posture minimize falls, and improve balance  Baseline:L shoulder. R iliac crest lowered ,  L sideflexion/ R digit III to floor: 56 cmL, 59 cm R  , limited R rotation  > L  Goal status: INITIAL   5. Pt will improve PFDI-7 questionnaire to  pts  score  change  to demo improved QOL  Baseline:    ( greater pts indicate greater negative impact on QOL)   83  pts  ( total)  24   pts  ( UIQ-7 )  10   pts  ( CRAIQ-7 )  10  pts  ( POPIQ-7 )  Baseline:  Goal status: INITIAL  6.  Pt will report complete urination without needing to return to toilet a second time > 50% of the time and/or pt reports she has to push prolapse up < 2x day to improve hygiene and function  Baseline: It is difficult to finish urination and return a second time to finish urination 30% of the time.  Pt reports she has push it back up 4 x day.  Goal status: INITIAL    7. Pt will demo equal alignment of shoulder and improved AROM with overhead reaching, and able to  dry hair with less pain < 5/10  Baseline:  Drying hair is difficult due limited range 9/10.  Reaching in cabinets, she avoid using R hand and has to use L.  Goal status: INITIAL  8. Pt reports L hip pain no longer awakens her at night to improve quality of sleep. Pt reports no more radiating L pain across 1 month  Baseline:  It awakens her in the middle of the night.  Dull persistent ache  in the evening. 8/10 . Radiating to the side of the lateral of leg  in a random pattern electric sensation.  Goal Status: INITIAL   9.  Baseline: bottom and top of feet. Numbness constant  .  Pt wear arch support , Barefoot is not comfortable  Goal Status: INITIAL   Pia Lupe Plump, PT 07/08/2024, 4:40 PM

## 2024-07-09 DIAGNOSIS — M7062 Trochanteric bursitis, left hip: Secondary | ICD-10-CM | POA: Diagnosis not present

## 2024-07-15 ENCOUNTER — Ambulatory Visit: Admitting: Physical Therapy

## 2024-07-15 DIAGNOSIS — M533 Sacrococcygeal disorders, not elsewhere classified: Secondary | ICD-10-CM

## 2024-07-15 DIAGNOSIS — G8929 Other chronic pain: Secondary | ICD-10-CM

## 2024-07-15 DIAGNOSIS — R2689 Other abnormalities of gait and mobility: Secondary | ICD-10-CM

## 2024-07-15 DIAGNOSIS — M25552 Pain in left hip: Secondary | ICD-10-CM

## 2024-07-15 NOTE — Patient Instructions (Signed)
 Feet care :  Self -feet massage  ? ?Handshake : fingers between toes, moving ballmounds/toes back and forth several times while other hand anchors at arch. Do the same at the hind/mid foot.  ?Heel to toes upward to a letter Big Letter T strokes to spread ballmounds and toes, several times, pinch between webs of toes  ?Run finger tips along top of foot between long bones "comb between the bones"  ? ? ?Wiggle toes and spread them out when relaxing  ? ?

## 2024-07-15 NOTE — Therapy (Signed)
 OUTPATIENT PHYSICAL THERAPY  TREATMENT    Patient Name: Cindy Arnold MRN: [id number] DOB:31-Dec-1950, 73 y.o., female Today's Date: 07/15/2024   PT End of Session - 07/15/24 1507     Visit Number 4    Number of Visits 10    Date for PT Re-Evaluation 09/03/24    PT Start Time 1502    PT Stop Time 1545    PT Time Calculation (min) 43 min    Activity Tolerance Patient tolerated treatment well;No increased pain    Behavior During Therapy WFL for tasks assessed/performed          Past Medical History:  Diagnosis Date   Basal cell carcinoma    multiple removed/[facility]   Breast cancer (HCC) 12/13   s/p right lumpectomy, s/p chemo and XRT   DES exposure in utero    Hepatitis A    History of chicken pox    Hyperactive airway disease    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HCC)    Migraines    Miscarriage    x3   Past Surgical History:  Procedure Laterality Date   ABDOMINAL HYSTERECTOMY     BASAL CELL CARCINOMA EXCISION     Multiple   BREAST BIOPSY  1975   Cyst   BREAST LUMPECTOMY WITH AXILLARY LYMPH NODE DISSECTION  01/06/13, 01/22/13   Dr. Georgaide, [facility], second surgery for pos margin   CERVICAL FUSION  2001   COLONOSCOPY     COLONOSCOPY WITH PROPOFOL  N/A 06/11/2022   Procedure: COLONOSCOPY WITH PROPOFOL ;  Surgeon: Unk Corinn Skiff, MD;  Location: [facility];  Service: Gastroenterology;  Laterality: N/A;   DILATION AND CURETTAGE OF UTERUS     x 3   LAPAROSCOPIC BILATERAL SALPINGO OOPHERECTOMY Bilateral 12/21/2020   Procedure: LAPAROSCOPIC BILATERAL SALPINGO OOPHORECTOMY;  Surgeon: Ward, Mitzie BROCKS, MD;  Location: [facility] OR[facility];  Service: Gynecology;  Laterality: Bilateral;   VAGINAL DELIVERY     x4   WISDOM TOOTH EXTRACTION     Patient Active Problem List   Diagnosis Date Noted   Vaginal prolapse 03/16/2024   Hypotension 03/11/2024   Mass of soft tissue of neck 10/27/2023   Neuropathy 06/23/2023   Shoulder pain 05/16/2023   Abnormal liver function tests  02/17/2023   Osteoarthritis of right shoulder 11/03/2022   Persistent cough 10/10/2022   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   Numbness of toes 07/10/2022   Vitamin D  deficiency 07/10/2022   Paroxysmal SVT (supraventricular tachycardia) (HCC) 06/17/2022   Polyp of ascending colon    Polyp of cecum    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   Hypercholesterolemia 10/05/2021   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   Macular atrophy, 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   Macular RPE mottling 06/07/2021   Radial styloid tenosynovitis 03/15/2021   Bursitis of shoulder 03/15/2021   Acquired trigger finger 03/15/2021   Increased heart rate 02/14/2021   Hypokalemia 11/22/2020   Urinary urgency 01/10/2020   Bilateral carpal tunnel syndrome 01/23/2019   Osteoarthritis of facet joint of lumbar spine 10/20/2018   Left hip pain 10/04/2018   GERD (gastroesophageal reflux disease) 08/10/2018   Numbness of right hand 08/10/2018   Cervical arthritis 03/20/2018   Neck pain 02/04/2018   Bronchiolitis 11/19/2017   Adhesive capsulitis of right shoulder 07/04/2017   Lateral epicondylitis 04/23/2017   History of colonic polyps 02/12/2017   Cough in adult patient 11/14/2015   Cough 10/14/2015   Palpitations 01/18/2015   Anxiety 01/18/2015   Healthcare maintenance 11/05/2013   Osteopenia 10/06/2013  History of breast cancer 12/03/2012   Screening for breast cancer 09/24/2012   Migraines 02/14/2012   Asthma 02/14/2012   Insomnia 02/14/2012    PCP: Glendia Shad MD   REFERRING PROVIDER: Glendia Shad MD   REFERRING DIAG: Vaginal prolapse   Rationale for Evaluation and Treatment Rehabilitation  THERAPY DIAG:  Sacrococcygeal disorders, not elsewhere classified  Other abnormalities of gait and mobility  Pain in left hip  Chronic right shoulder pain  ONSET DATE:   SUBJECTIVE:       SUBJECTIVE STATEMENT  Pt reported she went to she her orthopedist at [facility] and received a  cortisone shot at L hip. Pain has decreased to 1/10 . Pt now only feels intermittent pain at the L groin. Pt feels weird when barefoot. Pt has not worn her tennis shoes last week so she was not able to remove/ wean off her previous orthotic inserts/ arch support                                                                                                                                                                SUBJECTIVE STATEMENT on EVAL 06/25/24 :  1) Prolapse : Pt feels it is almost hanging outside 30% of the time. It occurred in Feb 2025.  It is difficult to finish urination and return a second time to finish urination 30% of the time.  Pt reports she has push it back up 4 x day. No difficulty with bowel movements.   2) R shoulder pain : started 1.5 years ago without injury. Not completed PT/. Had an MRI and was told it was arthritis in the joint. Drying hair is difficult due limited range 9/10.  Reaching in cabinets, she avoid using R hand and has to use L. Denied numbness and tingling down the arm   3) L hip pain: started in April without injury. Hurts with getting up and she has to adjust her position to not hurt.   It awakens her in the middle of the night.  Dull persistent ache  in the evening. 8/10 . Radiating to the side of the lateral of leg  in a random pattern electric sensation.   4) neuropathy in B feet occurs bottom and top of feet. Numbness constant  .  Pt wear arch support , Barefoot is not comfortable     PERTINENT HISTORY:  Breast biopsy  Date Unknown Abdominal hysterectomy Date Unknown Basal cell carcinoma excision  01/06/13, 01/22/13 Breast lumpectomy with axillary lymph node dissection     PAIN:  Are you having pain? Yes see above   PRECAUTIONS: None  WEIGHT BEARING RESTRICTIONS: No  FALLS:  Has patient fallen in last 6 months? 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 [employer]   PLOF: Independent  PATIENT GOALS:   To be able to stay active, become active ( was walking 3 x a week 3 miles), gardening, paying with grandchilden    OBJECTIVE:      [facility] PT Assessment - 07/15/24 1510       Observation/Other Assessments   Skin Integrity decreased sensation to sharp , B distal rays below midfoot joints dorsum and along transvers arch/ ballmound on plantar surface      Squat   Comments good carry over from last session      PROM   Overall PROM Comments R FADDIR limited > L      Palpation   SI assessment  levelled pelvis and shoulder height    Palpation comment R SIJ/ ischial tuberosity hypomobile, plantar fascia/ tib-fib hypombile , flexed distal phalanges      Bed Mobility   Bed Mobility --   head lifting in rolling         [facility] Adult PT Treatment/Exercise - 07/15/24 1510       Neuro Re-ed    Neuro Re-ed Details  cued for feet massage to promtoe feet mobility in mid/ fore/ hind foot, cued for logrolling to minimize head lifting to  worsening prolapse     Manual Therapy   Manual therapy comments AP mob in FADDIR, STM/MWM at SIJ, ischial tuberosity area, and areas noted in assessment at foot, tib-fib R to promote more hip / knee/ abd/ER and DF/EV , lower arches               HOME EXERCISE PROGRAM: See pt instruction section    ASSESSMENT:  CLINICAL IMPRESSION:   Improvements include:   Pt showed levelled pelvis today as pt has been practicing not crossing legs.   Pt noticed she had to only push prolapse back up 2 x day instead of 4 x day the week after eval visit.    Pt had decreased L hip pain to 1/10 after receiving cortisone shot .   Continued to address poor alignment of LKC  to improve feet numbness while pt is experiencing benefit of cortisone shot.   Further applied manual Tx to minimize plantar fascia / tib-fib/ SIJ R hypomobility. Pt demo'd  improved mobility at these areas. Plan to continue with more structural realignment with hip/knee ER/abd and DF/EV improvements to improve B feet numbness   Educated pt to wean off her orthotic inserts to minimize high arches and to use propioceptive inserts that will not further increase high arches but improve feet mobility at fore /mid/ hind foot.  Cued for feet massage to promtoe feet mobility in mid/ fore/ hind foot, cued for logrolling to minimize head lifting / worsening prolapse   Explained LKC deficits contributing to pelvic floor, B feet numbness, L hip pain.    Regional interdependent approaches will yield greater benefits in pt's POC .  These improvements will help promote optimize IAP system for improved pelvic floor function, trunk stability, gait, balance, stabilization with mobility tasks.  Plan to address pelvic floor issues once B feet numbness / LKC deficits improves  to yield better outcomes.  Pt benefits from skilled PT.    OBJECTIVE IMPAIRMENTS decreased activity tolerance, decreased coordination, decreased endurance, decreased mobility, difficulty walking, decreased ROM, decreased strength, decreased safety awareness, hypomobility, increased muscle spasms, impaired flexibility, improper body mechanics, postural dysfunction, and pain. scar restrictions   ACTIVITY LIMITATIONS  self-care,  sleep, home chores, work tasks    PARTICIPATION LIMITATIONS:  community, caring for grandchildren activities    PERSONAL FACTORS   affecting patient's functional outcome:    REHAB POTENTIAL: Good   CLINICAL DECISION MAKING: Evolving/moderate complexity   EVALUATION COMPLEXITY: Moderate    PATIENT EDUCATION:    Education details: Showed pt anatomy images. Explained muscles attachments/ connection, physiology of deep core system/ spinal- thoracic-pelvis-lower kinetic chain as they relate to pt's presentation, Sx, and past Hx.  Explained what and how these areas of deficits need to be restored to balance and function    See Therapeutic activity / neuromuscular re-education section  Answered pt's questions.   Person educated: Patient Education method: Explanation, Demonstration, Tactile cues, Verbal cues, and Handouts Education comprehension: verbalized understanding, returned demonstration, verbal cues required, tactile cues required, and needs further education     PLAN: PT FREQUENCY: 1x/week   PT DURATION: 10 weeks   PLANNED INTERVENTIONS:   Gait training;Stair training;Functional mobility training;DME Instruction;Therapeutic activities;Therapeutic exercise;Balance training;Neuromuscular re-education;Patient/family education;Vestibular;Visual/perceptual remediation/compensation;Passive range of motion;Moist Heat;Cryotherapy;Traction;Canalith Repostioning;Joint Manipulations;Manual lymph drainage;Manual techniques;Scar mobilization;Energy conservation;Dry needling;ADLs/Self Care Home Management;Biofeedback;Electrical Stimulation;Taping    PLAN FOR NEXT SESSION: See clinical impression for plan     GOALS: Goals reviewed with patient? Yes  SHORT TERM GOALS: Target date: 07/23/2024    Pt will demo IND with HEP                    Baseline: Not IND            Goal status: INITIAL   LONG TERM GOALS: Target date: 09/03/2024    1.Pt will demo proper deep core coordination without chest breathing and optimal excursion of diaphragm/pelvic floor in order to promote spinal stability and pelvic floor function  Baseline: dyscoordination Goal status: INITIAL  2.  Pt will demo proper body mechanics in against gravity tasks and ADLs  work tasks, fitness  to minimize straining pelvic floor / back    Baseline: not IND, improper form that places strain on pelvic floor  Goal status: INITIAL    3. Pt will demo increased gait speed  with reciprocal gait pattern, longer stride length  in order to ambulate safely in  community and return to fitness routine  Baseline:  L pelvic lateral shift, decreased R stance, limited R trunk posterior rotation  Goal status:MET  ( 07/08/24 MET)     4. Pt will demo levelled pelvic girdle and shoulder height  and demo B sideflexion measurements   in order to progress to deep core strengthening HEP and restore mobility at spine, pelvis, gait, posture minimize falls, and improve balance  Baseline:L shoulder. R iliac crest lowered ,  L sideflexion/ R digit III to floor: 56 cmL, 59 cm R  , limited R rotation  > L  Goal status: INITIAL   5. Pt will improve PFDI-7 questionnaire to  pts  score change  to demo improved QOL  Baseline:    ( greater pts indicate greater negative impact on QOL)   83  pts  ( total)  24   pts  ( UIQ-7 )  10   pts  ( CRAIQ-7 )  10  pts  (  POPIQ-7 )  Baseline:  Goal status: INITIAL  6.  Pt will report complete urination without needing to return to toilet a second time > 50% of the time and/or pt reports she has to push prolapse up < 2x day to improve hygiene and function  Baseline: It is difficult to finish urination and return a second time to finish urination 30% of the time.  Pt reports she has push it back up 4 x day.  Goal status: INITIAL    7. Pt will demo equal alignment of shoulder and improved AROM with overhead reaching, and able to dry hair with less pain < 5/10  Baseline:  Drying hair is difficult due limited range 9/10.  Reaching in cabinets, she avoid using R hand and has to use L.  Goal status: INITIAL  8. Pt reports L hip pain no longer awakens her at night to improve quality of sleep. Pt reports no more radiating L pain across 1 month  Baseline:  It awakens her in the middle of the night.  Dull persistent ache  in the evening. 8/10 . Radiating to the side of the lateral of leg  in a random pattern electric sensation.  Goal Status: INITIAL   9.  Baseline: bottom and top of feet. Numbness constant  .  Pt wear arch support  , Barefoot is not comfortable  Goal Status: INITIAL   Pia Lupe Plump, PT 07/15/2024, 3:08 PM

## 2024-07-21 ENCOUNTER — Other Ambulatory Visit (INDEPENDENT_AMBULATORY_CARE_PROVIDER_SITE_OTHER)

## 2024-07-21 DIAGNOSIS — E7800 Pure hypercholesterolemia, unspecified: Secondary | ICD-10-CM | POA: Diagnosis not present

## 2024-07-21 LAB — HEPATIC FUNCTION PANEL
ALT: 22 U/L (ref 0–35)
AST: 21 U/L (ref 0–37)
Albumin: 4.4 g/dL (ref 3.5–5.2)
Alkaline Phosphatase: 55 U/L (ref 39–117)
Bilirubin, Direct: 0.1 mg/dL (ref 0.0–0.3)
Total Bilirubin: 0.7 mg/dL (ref 0.2–1.2)
Total Protein: 7 g/dL (ref 6.0–8.3)

## 2024-07-23 ENCOUNTER — Ambulatory Visit: Admitting: Physical Therapy

## 2024-07-23 DIAGNOSIS — G8929 Other chronic pain: Secondary | ICD-10-CM

## 2024-07-23 DIAGNOSIS — M25552 Pain in left hip: Secondary | ICD-10-CM

## 2024-07-23 DIAGNOSIS — R2689 Other abnormalities of gait and mobility: Secondary | ICD-10-CM

## 2024-07-23 DIAGNOSIS — M533 Sacrococcygeal disorders, not elsewhere classified: Secondary | ICD-10-CM

## 2024-07-23 NOTE — Patient Instructions (Signed)
 Deep core level 1 ( handout)        Minisquat: Scoot buttocks back slight, hinge like you are looking at your reflection on a pond  Knees behind toes,  Inhale to smell flowers  Exhale on the rise like rocket  Do not lock knees, have more weight across ballmounds of feet, toes relaxed and spread them, not grip them   10 reps x 3 x day

## 2024-07-23 NOTE — Therapy (Signed)
 OUTPATIENT PHYSICAL THERAPY  TREATMENT    Patient Name: Cindy Arnold MRN: [id number] DOB:11-10-1951, 73 y.o., female Today's Date: 07/23/2024   PT End of Session - 07/23/24 0942     Visit Number 5    Number of Visits 10    Date for PT Re-Evaluation 09/03/24    PT Start Time 0940    PT Stop Time 1020    PT Time Calculation (min) 40 min    Activity Tolerance Patient tolerated treatment well;No increased pain    Behavior During Therapy WFL for tasks assessed/performed          Past Medical History:  Diagnosis Date   Basal cell carcinoma    multiple removed/[facility]   Breast cancer (HCC) 12/13   s/p right lumpectomy, s/p chemo and XRT   DES exposure in utero    Hepatitis A    History of chicken pox    Hyperactive airway disease    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HCC)    Migraines    Miscarriage    x3   Past Surgical History:  Procedure Laterality Date   ABDOMINAL HYSTERECTOMY     BASAL CELL CARCINOMA EXCISION     Multiple   BREAST BIOPSY  1975   Cyst   BREAST LUMPECTOMY WITH AXILLARY LYMPH NODE DISSECTION  01/06/13, 01/22/13   Dr. Georgaide, [facility], second surgery for pos margin   CERVICAL FUSION  2001   COLONOSCOPY     COLONOSCOPY WITH PROPOFOL  N/A 06/11/2022   Procedure: COLONOSCOPY WITH PROPOFOL ;  Surgeon: Unk Corinn Skiff, MD;  Location: [facility] END[facility]COPY;  Service: Gastroenterology;  Laterality: N/A;   DILATION AND CURETTAGE OF UTERUS     x 3   LAPAROSCOPIC BILATERAL SALPINGO OOPHERECTOMY Bilateral 12/21/2020   Procedure: LAPAROSCOPIC BILATERAL SALPINGO OOPHORECTOMY;  Surgeon: Ward, Mitzie BROCKS, MD;  Location: [facility] ORS;  Service: Gynecology;  Laterality: Bilateral;   VAGINAL DELIVERY     x4   WISDOM TOOTH EXTRACTION     Patient Active Problem List   Diagnosis Date Noted   Vaginal prolapse 03/16/2024   Hypotension 03/11/2024   Mass of soft tissue of neck 10/27/2023   Neuropathy 06/23/2023   Shoulder pain 05/16/2023   Abnormal liver function tests  02/17/2023   Osteoarthritis of right shoulder 11/03/2022   Persistent cough 10/10/2022   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   Numbness of toes 07/10/2022   Vitamin D  deficiency 07/10/2022   Paroxysmal SVT (supraventricular tachycardia) (HCC) 06/17/2022   Polyp of ascending colon    Polyp of cecum    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   Hypercholesterolemia 10/05/2021   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   Macular atrophy, 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   Macular RPE mottling 06/07/2021   Radial styloid tenosynovitis 03/15/2021   Bursitis of shoulder 03/15/2021   Acquired trigger finger 03/15/2021   Increased heart rate 02/14/2021   Hypokalemia 11/22/2020   Urinary urgency 01/10/2020   Bilateral carpal tunnel syndrome 01/23/2019   Osteoarthritis of facet joint of lumbar spine 10/20/2018   Left hip pain 10/04/2018   GERD (gastroesophageal reflux disease) 08/10/2018   Numbness of right hand 08/10/2018   Cervical arthritis 03/20/2018   Neck pain 02/04/2018   Bronchiolitis 11/19/2017   Adhesive capsulitis of right shoulder 07/04/2017   Lateral epicondylitis 04/23/2017   History of colonic polyps 02/12/2017   Cough in adult patient 11/14/2015   Cough 10/14/2015   Palpitations 01/18/2015   Anxiety 01/18/2015   Healthcare maintenance 11/05/2013   Osteopenia 10/06/2013  History of breast cancer 12/03/2012   Screening for breast cancer 09/24/2012   Migraines 02/14/2012   Asthma 02/14/2012   Insomnia 02/14/2012    PCP: Glendia Shad MD   REFERRING PROVIDER: Glendia Shad MD   REFERRING DIAG: Vaginal prolapse   Rationale for Evaluation and Treatment Rehabilitation  THERAPY DIAG:  Sacrococcygeal disorders, not elsewhere classified  Other abnormalities of gait and mobility  Pain in left hip  Chronic right shoulder pain  ONSET DATE:   SUBJECTIVE:       SUBJECTIVE STATEMENT  Pt reported  she bought the proioceptive insoles from [employer] and wore them  without issues. Pt no longer wears her previous arch support stiff inserts.   Pt had one episode of leakage before making it to the bathroom last night when she was very exhausted                                                                                                                                                                SUBJECTIVE STATEMENT on EVAL 06/25/24 :  1) Prolapse : Pt feels it is almost hanging outside 30% of the time. It occurred in Feb 2025.  It is difficult to finish urination and return a second time to finish urination 30% of the time.  Pt reports she has push it back up 4 x day. No difficulty with bowel movements.   2) R shoulder pain : started 1.5 years ago without injury. Not completed PT/. Had an MRI and was told it was arthritis in the joint. Drying hair is difficult due limited range 9/10.  Reaching in cabinets, she avoid using R hand and has to use L. Denied numbness and tingling down the arm   3) L hip pain: started in April without injury. Hurts with getting up and she has to adjust her position to not hurt.   It awakens her in the middle of the night.  Dull persistent ache  in the evening. 8/10 . Radiating to the side of the lateral of leg  in a random pattern electric sensation.   4) neuropathy in B feet occurs bottom and top of feet. Numbness constant  .  Pt wear arch support , Barefoot is not comfortable     PERTINENT HISTORY:  Breast biopsy  Date Unknown Abdominal hysterectomy Date Unknown Basal cell carcinoma excision  01/06/13, 01/22/13 Breast lumpectomy with axillary lymph node dissection     PAIN:  Are you having pain? Yes see above   PRECAUTIONS: None  WEIGHT BEARING RESTRICTIONS: No  FALLS:  Has patient fallen in last 6 months? No  LIVING ENVIRONMENT: Lives with: husband  Lives in: 2 story  Stairs: 1 STE w/o rail  Has following equipment at home: No   OCCUPATION:  retired from  [employer] for [employer]    PLOF: Independent  PATIENT GOALS:   To be able to stay active, become active ( was walking 3 x a week 3 miles), gardening, paying with grandchilden    OBJECTIVE:    [facility] PT Assessment - 07/23/24 0943       Sit to Stand   Comments adducted knees      PROM   Overall PROM Comments improved FADDIR B AROM ,      Strength   Overall Strength Comments B hip abd 4/5      Palpation   SI assessment  levelled pelvis and shoulder height           [facility] Adult PT Treatment/Exercise - 07/23/24 0943       Self-Care   Other Self-Care Comments  explained upcoming sessions for pelvic floor assessment external/ internal .  explained the benefit fro deep core HEP for prolapse and stability of feet and balance      Therapeutic Activites    Therapeutic Activities Other Therapeutic Activities      Neuro Re-ed    Neuro Re-ed Details  cued for abducted knees and proper alignment  , cued for deep core level 1 ( cued for less ab straining and propioception for anterior tilt of pelvis and LKC )      Manual Therapy   Manual therapy comments STM/MWM at R plantar fascia/ midfoot, knee, R                HOME EXERCISE PROGRAM: See pt instruction section    ASSESSMENT:  CLINICAL IMPRESSION:   Improvements include:   Pt showed levelled pelvis today as pt has been practicing not crossing legs.   Pt noticed she had to only push prolapse back up 2 x day instead of 4 x day the week after eval visit.    Wean off her arch support stiff  orthotic inserts to minimize high arches and pt  uses propioceptive inserts that will not further increase high arches but improve feet mobility at fore /mid/ hind foot.      Applied manual Tx to  [facility] deficits to promote more DF/EV on R, ER/abd of knee/ hip, SIJ which will further help with  contributing to pelvic floor, B feet numbness, L hip pain.    Progressed to deep core level 1 only today. Provided cues for  less ab straining and propioception  for anterior tilt of pelvis and LKC . Pt demo'd correctly post Tx. Plan to progress to Deep core level 2 at next session.   Explained the benefit fro deep core HEP for prolapse and stability of feet and balance. Explained upcoming sessions for pelvic floor assessment external/ internal .   Regional interdependent approaches will yield greater benefits in pt's POC .  These improvements will help promote optimize IAP system for improved pelvic floor function, trunk stability, gait, balance, stabilization with mobility tasks.  Plan to address pelvic floor issues once B feet numbness / LKC deficits improves  to yield better outcomes.                                                       Pt benefits from skilled PT.    OBJECTIVE IMPAIRMENTS decreased activity tolerance, decreased coordination, decreased endurance, decreased mobility, difficulty walking, decreased ROM,  decreased strength, decreased safety awareness, hypomobility, increased muscle spasms, impaired flexibility, improper body mechanics, postural dysfunction, and pain. scar restrictions   ACTIVITY LIMITATIONS  self-care,  sleep, home chores, work tasks    PARTICIPATION LIMITATIONS:  community, caring for grandchildren activities    PERSONAL FACTORS   affecting patient's functional outcome:    REHAB POTENTIAL: Good   CLINICAL DECISION MAKING: Evolving/moderate complexity   EVALUATION COMPLEXITY: Moderate    PATIENT EDUCATION:    Education details: Showed pt anatomy images. Explained muscles attachments/ connection, physiology of deep core system/ spinal- thoracic-pelvis-lower kinetic chain as they relate to pt's presentation, Sx, and past Hx. Explained what and how these areas of deficits need to be restored to balance and function    See Therapeutic activity / neuromuscular re-education section  Answered pt's questions.   Person educated: Patient Education method: Explanation, Demonstration, Tactile cues, Verbal cues, and  Handouts Education comprehension: verbalized understanding, returned demonstration, verbal cues required, tactile cues required, and needs further education     PLAN: PT FREQUENCY: 1x/week   PT DURATION: 10 weeks   PLANNED INTERVENTIONS:   Gait training;Stair training;Functional mobility training;DME Instruction;Therapeutic activities;Therapeutic exercise;Balance training;Neuromuscular re-education;Patient/family education;Vestibular;Visual/perceptual remediation/compensation;Passive range of motion;Moist Heat;Cryotherapy;Traction;Canalith Repostioning;Joint Manipulations;Manual lymph drainage;Manual techniques;Scar mobilization;Energy conservation;Dry needling;ADLs/Self Care Home Management;Biofeedback;Electrical Stimulation;Taping    PLAN FOR NEXT SESSION: See clinical impression for plan     GOALS: Goals reviewed with patient? Yes  SHORT TERM GOALS: Target date: 07/23/2024    Pt will demo IND with HEP                    Baseline: Not IND            Goal status: INITIAL   LONG TERM GOALS: Target date: 09/03/2024    1.Pt will demo proper deep core coordination without chest breathing and optimal excursion of diaphragm/pelvic floor in order to promote spinal stability and pelvic floor function  Baseline: dyscoordination Goal status: INITIAL  2.  Pt will demo proper body mechanics in against gravity tasks and ADLs  work tasks, fitness  to minimize straining pelvic floor / back    Baseline: not IND, improper form that places strain on pelvic floor  Goal status: INITIAL    3. Pt will demo increased gait speed  with reciprocal gait pattern, longer stride length  in order to ambulate safely in community and return to fitness routine  Baseline:  L pelvic lateral shift, decreased R stance, limited R trunk posterior rotation  Goal status:MET  ( 07/08/24 MET)     4. Pt will demo levelled pelvic girdle and shoulder height  and demo B sideflexion measurements   in order to progress  to deep core strengthening HEP and restore mobility at spine, pelvis, gait, posture minimize falls, and improve balance  Baseline:L shoulder. R iliac crest lowered ,  L sideflexion/ R digit III to floor: 56 cmL, 59 cm R  , limited R rotation  > L  Goal status: INITIAL   5. Pt will improve PFDI-7 questionnaire to  pts  score change  to demo improved QOL  Baseline:    ( greater pts indicate greater negative impact on QOL)   83  pts  ( total)  24   pts  ( UIQ-7 )  10   pts  ( CRAIQ-7 )  10  pts  ( POPIQ-7 )  Baseline:  Goal status: INITIAL  6.  Pt will report complete urination without needing to return to toilet a  second time > 50% of the time and/or pt reports she has to push prolapse up < 2x day to improve hygiene and function  Baseline: It is difficult to finish urination and return a second time to finish urination 30% of the time.  Pt reports she has push it back up 4 x day.  Goal status: INITIAL    7. Pt will demo equal alignment of shoulder and improved AROM with overhead reaching, and able to dry hair with less pain < 5/10  Baseline:  Drying hair is difficult due limited range 9/10.  Reaching in cabinets, she avoid using R hand and has to use L.  Goal status: INITIAL  8. Pt reports L hip pain no longer awakens her at night to improve quality of sleep. Pt reports no more radiating L pain across 1 month  Baseline:  It awakens her in the middle of the night.  Dull persistent ache  in the evening. 8/10 . Radiating to the side of the lateral of leg  in a random pattern electric sensation.  Goal Status: INITIAL   9.  Baseline: bottom and top of feet. Numbness constant  .  Pt wear arch support , Barefoot is not comfortable  Goal Status: INITIAL   Pia Lupe Plump, PT 07/23/2024, 9:49 AM

## 2024-07-24 ENCOUNTER — Ambulatory Visit: Attending: Medical | Admitting: Medical

## 2024-07-24 ENCOUNTER — Encounter: Payer: Self-pay | Admitting: Medical

## 2024-07-24 VITALS — BP 110/74 | HR 69 | Ht 68.5 in | Wt 150.0 lb

## 2024-07-24 DIAGNOSIS — I471 Supraventricular tachycardia, unspecified: Secondary | ICD-10-CM | POA: Diagnosis not present

## 2024-07-24 DIAGNOSIS — E782 Mixed hyperlipidemia: Secondary | ICD-10-CM

## 2024-07-24 NOTE — Progress Notes (Signed)
  Cardiology Office Note   Date:  07/24/2024  ID:  MYRKA SYLVA, DOB 1951-04-24, MRN [id number] PCP: Glendia Shad, MD  [facility] Providers Cardiologist:  Redell Cave, MD     History of Present Illness Cindy Arnold is a 73 y.o. female with a hx of breast cancer s/p lumpectomy - chemo +XRT, ophthalmic migraines, HLD who presents for follow-up.    Patient has been seen for palpitations in the past. Prior cardiac heart monitor in 2022 showed occasional paroxysmal SVT. She takes propranolol  and symptoms are well-controlled.  Patient was last seen in July 2024 for ER follow-up for hypotension.  Blood pressure was 95/66, orthostatics negative.  She was overall feeling normal.  Propranolol  was decreased.  Today, the patient is overall doing well. She is not on Inderal  anymore since this was prescribed many years ago for eye issues. She denies chest pain or SOB. She has occasional heart fluttering that does not affect quality of life. No lower leg edema, lightheadedness, or dizziness. She tries to walk 3 days a week.   Studies Reviewed EKG Interpretation Date/Time:  Friday July 24 2024 10:59:41 EDT Ventricular Rate:  69 PR Interval:  124 QRS Duration:  78 QT Interval:  376 QTC Calculation: 402 R Axis:   14  Text Interpretation: Normal sinus rhythm Septal infarct , age undetermined When compared with ECG of 17-Jul-2023 10:19, Septal infarct is now Present Confirmed by Franchester, Shankar Silber ([id number]) on 07/24/2024 11:14:04 AM    Heart monitor 02/2021   Patch Wear Time:  13 days and 21 hours (2022-03-04T11:16:03-499 to 2022-03-18T10:10:42-0400)   Patient had a min HR of 46 bpm, max HR of 182 bpm, and avg HR of 67 bpm. Predominant underlying rhythm was Sinus Rhythm.  Occasional paroxysmal SVT noted. Isolated SVEs were rare (<1.0%), SVE Couplets were rare (<1.0%), and SVE Triplets were rare (<1.0%).     Physical Exam VS:  BP 110/74   Pulse 69   Ht 5' 8.5 (1.74 m)   Wt 150 lb (68  kg)   SpO2 97%   BMI 22.48 kg/m        Wt Readings from Last 3 Encounters:  07/24/24 150 lb (68 kg)  06/23/24 146 lb 9.6 oz (66.5 kg)  03/11/24 144 lb 9.6 oz (65.6 kg)    GEN: Well nourished, well developed in no acute distress NECK: No JVD; No carotid bruits CARDIAC: RRR, no murmurs, rubs, gallops RESPIRATORY:  Clear to auscultation without rales, wheezing or rhonchi  ABDOMEN: Soft, non-tender, non-distended EXTREMITIES:  No edema; No deformity   ASSESSMENT AND PLAN  pSVT/palpitations He has rare fluttering in her chest, but this does not affect quality of life.   H/o Hypotension Propranolol  has been stopped in the last year.  It had been previously prescribed for an eye issue many years ago. Patient has h/o hypotension as well. BP today is 110/74.  HLD LDL 114. Continue  Crestor        Dispo: follow-up in 1 year  Signed, Hero Kulish VEAR Franchester, PA-C

## 2024-07-24 NOTE — Patient Instructions (Signed)
 Medication Instructions:  Your physician recommends that you continue on your current medications as directed. Please refer to the Current Medication list given to you today.   *If you need a refill on your cardiac medications before your next appointment, please call your pharmacy*  Lab Work: No labs ordered today   Testing/Procedures: No test ordered today   Follow-Up: At [facility], you and your health needs are our priority.  As part of our continuing mission to provide you with exceptional heart care, our providers are all part of one team.  This team includes your primary Cardiologist (physician) and Advanced Practice Providers or APPs (Physician Assistants and Nurse Practitioners) who all work together to provide you with the care you need, when you need it.  Your next appointment:   1 year(s)  Provider:   You may see Constancia Delton, MD or one of the following Advanced Practice Providers on your designated Care Team:   Laneta Pintos, NP Gildardo Labrador, PA-C Varney Gentleman, PA-C Cadence Manville, PA-C Ronald Cockayne, NP Morey Ar, NP

## 2024-07-28 ENCOUNTER — Other Ambulatory Visit: Payer: Self-pay | Admitting: Internal Medicine

## 2024-08-06 ENCOUNTER — Other Ambulatory Visit: Payer: Self-pay | Admitting: Internal Medicine

## 2024-08-12 ENCOUNTER — Ambulatory Visit: Attending: Internal Medicine | Admitting: Physical Therapy

## 2024-08-12 DIAGNOSIS — M533 Sacrococcygeal disorders, not elsewhere classified: Secondary | ICD-10-CM | POA: Diagnosis not present

## 2024-08-12 DIAGNOSIS — R2689 Other abnormalities of gait and mobility: Secondary | ICD-10-CM | POA: Insufficient documentation

## 2024-08-12 DIAGNOSIS — M25552 Pain in left hip: Secondary | ICD-10-CM | POA: Insufficient documentation

## 2024-08-12 DIAGNOSIS — G8929 Other chronic pain: Secondary | ICD-10-CM | POA: Diagnosis not present

## 2024-08-12 DIAGNOSIS — M25511 Pain in right shoulder: Secondary | ICD-10-CM | POA: Diagnosis not present

## 2024-08-12 NOTE — Therapy (Signed)
 OUTPATIENT PHYSICAL THERAPY  TREATMENT    Patient Name: Cindy Arnold MRN: [id number] DOB:1951-04-02, 73 y.o., female Today's Date: 08/12/2024   PT End of Session - 08/12/24 0856     Visit Number 6    Number of Visits 10    Date for PT Re-Evaluation 09/03/24    PT Start Time 0850    PT Stop Time 0930    PT Time Calculation (min) 40 min    Activity Tolerance Patient tolerated treatment well;No increased pain    Behavior During Therapy WFL for tasks assessed/performed          Past Medical History:  Diagnosis Date   Basal cell carcinoma    multiple removed/[facility]   Breast cancer (HCC) 12/13   s/p right lumpectomy, s/p chemo and XRT   DES exposure in utero    Hepatitis A    History of chicken pox    Hyperactive airway disease    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HCC)    Migraines    Miscarriage    x3   Past Surgical History:  Procedure Laterality Date   ABDOMINAL HYSTERECTOMY     BASAL CELL CARCINOMA EXCISION     Multiple   BREAST BIOPSY  1975   Cyst   BREAST LUMPECTOMY WITH AXILLARY LYMPH NODE DISSECTION  01/06/13, 01/22/13   Dr. Georgaide, [facility], second surgery for pos margin   CERVICAL FUSION  2001   COLONOSCOPY     COLONOSCOPY WITH PROPOFOL  N/A 06/11/2022   Procedure: COLONOSCOPY WITH PROPOFOL ;  Surgeon: Unk Corinn Skiff, MD;  Location: [facility];  Service: Gastroenterology;  Laterality: N/A;   DILATION AND CURETTAGE OF UTERUS     x 3   LAPAROSCOPIC BILATERAL SALPINGO OOPHERECTOMY Bilateral 12/21/2020   Procedure: LAPAROSCOPIC BILATERAL SALPINGO OOPHORECTOMY;  Surgeon: Ward, Mitzie BROCKS, MD;  Location: [facility];  Service: Gynecology;  Laterality: Bilateral;   VAGINAL DELIVERY     x4   WISDOM TOOTH EXTRACTION     Patient Active Problem List   Diagnosis Date Noted   Vaginal prolapse 03/16/2024   Hypotension 03/11/2024   Mass of soft tissue of neck 10/27/2023   Neuropathy 06/23/2023   Shoulder pain 05/16/2023   Abnormal liver function tests  02/17/2023   Osteoarthritis of right shoulder 11/03/2022   Persistent cough 10/10/2022   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   Numbness of toes 07/10/2022   Vitamin D  deficiency 07/10/2022   Paroxysmal SVT (supraventricular tachycardia) (HCC) 06/17/2022   Polyp of ascending colon    Polyp of cecum    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   Hypercholesterolemia 10/05/2021   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   Macular atrophy, 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   Macular RPE mottling 06/07/2021   Radial styloid tenosynovitis 03/15/2021   Bursitis of shoulder 03/15/2021   Acquired trigger finger 03/15/2021   Increased heart rate 02/14/2021   Hypokalemia 11/22/2020   Urinary urgency 01/10/2020   Bilateral carpal tunnel syndrome 01/23/2019   Osteoarthritis of facet joint of lumbar spine 10/20/2018   Left hip pain 10/04/2018   GERD (gastroesophageal reflux disease) 08/10/2018   Numbness of right hand 08/10/2018   Cervical arthritis 03/20/2018   Neck pain 02/04/2018   Bronchiolitis 11/19/2017   Adhesive capsulitis of right shoulder 07/04/2017   Lateral epicondylitis 04/23/2017   History of colonic polyps 02/12/2017   Cough in adult patient 11/14/2015   Cough 10/14/2015   Palpitations 01/18/2015   Anxiety 01/18/2015   Healthcare maintenance 11/05/2013   Osteopenia 10/06/2013  History of breast cancer 12/03/2012   Screening for breast cancer 09/24/2012   Migraines 02/14/2012   Asthma 02/14/2012   Insomnia 02/14/2012    PCP: Glendia Shad MD   REFERRING PROVIDER: Glendia Shad MD   REFERRING DIAG: Vaginal prolapse   Rationale for Evaluation and Treatment Rehabilitation  THERAPY DIAG:  Sacrococcygeal disorders, not elsewhere classified  Other abnormalities of gait and mobility  Pain in left hip  Chronic right shoulder pain  ONSET DATE:   SUBJECTIVE:       SUBJECTIVE STATEMENT  Improvements  include:   Prolapse: Pt no longer needs to push prolapse  up 4 x a day , she only has to do that 4 x a week.  Pt is able to complete urinate and no longer has to    R shoulder pain: this area has not been addressed with PT yet      L hip pain no longer awakens her at night to improve quality of sleep  . The cortisone has helped. Pt is compliant with performing squats and  sit to stand with improved alignment of knees and has improved DF/EV in ankles.   Neuropathy in B feet occurs bottom and top of feet. Numbness  is only by toes and no longer by the heels and toes. Pt is complaint with the feet mobility HEP.                                                      SUBJECTIVE STATEMENT on EVAL 06/25/24 :  1) Prolapse : Pt feels it is almost hanging outside 30% of the time. It occurred in Feb 2025.  It is difficult to finish urination and return a second time to finish urination 30% of the time.  Pt reports she has push it back up 4 x day. No difficulty with bowel movements.   2) R shoulder pain : started 1.5 years ago without injury. Not completed PT/. Had an MRI and was told it was arthritis in the joint. Drying hair is difficult due limited range 9/10.  Reaching in cabinets, she avoid using R hand and has to use L. Denied numbness and tingling down the arm   3) L hip pain: started in April without injury. Hurts with getting up and she has to adjust her position to not hurt.   It awakens her in the middle of the night.  Dull persistent ache  in the evening. 8/10 . Radiating to the side of the lateral of leg  in a random pattern electric sensation.   4) neuropathy in B feet occurs bottom and top of feet. Numbness constant  .  Pt wear arch support , Barefoot is not comfortable     PERTINENT HISTORY:  Breast biopsy  Date Unknown Abdominal hysterectomy Date Unknown Basal cell carcinoma excision  01/06/13, 01/22/13 Breast lumpectomy with axillary lymph node dissection     PAIN:  Are you having pain? Yes see above   PRECAUTIONS: None  WEIGHT  BEARING RESTRICTIONS: No  FALLS:  Has patient fallen in last 6 months? No  LIVING ENVIRONMENT: Lives with: husband  Lives in: 2 story  Stairs: 1 STE w/o rail  Has following equipment at home: No   OCCUPATION:  retired from [employer] for [employer]   PLOF: Independent  PATIENT GOALS:  To be able to stay active, become active ( was walking 3 x a week 3 miles), gardening, paying with grandchilden    OBJECTIVE:    [facility] PT Assessment - 08/12/24 0913       Sit to Stand   Comments good carry over with abducted knees but pt was not scooted to the front of seat      AROM   Overall AROM Comments L sideflexion/ R digit III to floor: 56 cmL, 59 cm R  , limited R rotation  > L  ( on eval 06/25/24)  .  improvement :  57 cm with sideflexion  B,  08/12/24      Palpation   SI assessment  levelled pelvis and shoulder height    Palpation comment tightness along L C/T junction,   R shoulder abd/ elbow flex , supination in hooklying  limited          [facility] Adult PT Treatment/Exercise - 08/12/24 0001       Self-Care   Other Self-Care Comments  strategizes for compliance to HEP , making time for herself  for HEP, pt voiced she will do it int he morning before she had to her grandmother duties,  reassessed goals,      Neuro Re-ed    Neuro Re-ed Details  cued for deep core level 1, cued for cervical/ scapular AROM for mobility      Manual Therapy   Manual therapy comments distraction at occiput , ( plan to apply more manual Tx at next session for realignment of upper spine and address neck and shoulder limitations to achieve shoulder related goals)               HOME EXERCISE PROGRAM: See pt instruction section    ASSESSMENT:  CLINICAL IMPRESSION:    Improvements  include:   Prolapse: Pt no longer needs to push prolapse up 4 x a day , she only has to do that 4 x a week.  Pt is able to completely  urinate and no longer has to return a 2nd time.  Pt noticed she  had to only push prolapse back up 2 x day instead of 4 x day the week after eval visit.    R shoulder pain: this area has not been addressed with PT yet      L hip pain no longer awakens her at night to improve quality of sleep  . The cortisone has helped. Pt is compliant with performing squats and  sit to stand with improved alignment of knees and has improved DF/EV in ankles.   Neuropathy in B feet occurs bottom and top of feet. Numbness  is only by toes and no longer by the heels and toes. Pt is complaint with the feet mobility HEP.    Wean off her arch support stiff  orthotic inserts to minimize high arches and pt  uses propioceptive inserts that will not further increase high arches but improve feet mobility at fore /mid/ hind foot.     Progressed to deep core level 1 only today. Provided cues for  less ab straining and propioception for anterior tilt of pelvis and LKC . Pt demo'd correctly post Tx. Plan to progress to Deep core level 2 at next session.     Explained the benefit fro deep core HEP for prolapse and stability of feet and balance.     Explained upcoming sessions for  to apply more manual Tx at next session for realignment  of upper spine and address neck and shoulder limitations to achieve shoulder related goals.     Regional interdependent approaches will yield greater benefits in pt's POC .  These improvements will help promote optimize IAP system for improved pelvic floor function, trunk stability, gait, balance, stabilization with mobility tasks.  Plan to address pelvic floor issues once B feet numbness / LKC deficits improves  to yield better outcomes.                                                       Pt benefits from skilled PT.    OBJECTIVE IMPAIRMENTS decreased activity tolerance, decreased coordination, decreased endurance, decreased mobility, difficulty walking, decreased ROM, decreased strength, decreased safety awareness, hypomobility, increased muscle  spasms, impaired flexibility, improper body mechanics, postural dysfunction, and pain. scar restrictions   ACTIVITY LIMITATIONS  self-care,  sleep, home chores, work tasks    PARTICIPATION LIMITATIONS:  community, caring for grandchildren activities    PERSONAL FACTORS   affecting patient's functional outcome:    REHAB POTENTIAL: Good   CLINICAL DECISION MAKING: Evolving/moderate complexity   EVALUATION COMPLEXITY: Moderate    PATIENT EDUCATION:    Education details: Showed pt anatomy images. Explained muscles attachments/ connection, physiology of deep core system/ spinal- thoracic-pelvis-lower kinetic chain as they relate to pt's presentation, Sx, and past Hx. Explained what and how these areas of deficits need to be restored to balance and function    See Therapeutic activity / neuromuscular re-education section  Answered pt's questions.   Person educated: Patient Education method: Explanation, Demonstration, Tactile cues, Verbal cues, and Handouts Education comprehension: verbalized understanding, returned demonstration, verbal cues required, tactile cues required, and needs further education     PLAN: PT FREQUENCY: 1x/week   PT DURATION: 10 weeks   PLANNED INTERVENTIONS:   Gait training;Stair training;Functional mobility training;DME Instruction;Therapeutic activities;Therapeutic exercise;Balance training;Neuromuscular re-education;Patient/family education;Vestibular;Visual/perceptual remediation/compensation;Passive range of motion;Moist Heat;Cryotherapy;Traction;Canalith Repostioning;Joint Manipulations;Manual lymph drainage;Manual techniques;Scar mobilization;Energy conservation;Dry needling;ADLs/Self Care Home Management;Biofeedback;Electrical Stimulation;Taping    PLAN FOR NEXT SESSION: See clinical impression for plan     GOALS: Goals reviewed with patient? Yes  SHORT TERM GOALS: Target date: 07/23/2024    Pt will demo IND with HEP                     Baseline: Not IND            Goal status: INITIAL   LONG TERM GOALS: Target date: 09/03/2024    1.Pt will demo proper deep core coordination without chest breathing and optimal excursion of diaphragm/pelvic floor in order to promote spinal stability and pelvic floor function  Baseline: dyscoordination Goal status: Ongoing   2.  Pt will demo proper body mechanics in against gravity tasks and ADLs  work tasks, fitness  to minimize straining pelvic floor / back    Baseline: not IND, improper form that places strain on pelvic floor  Goal status: MET     3. Pt will demo increased gait speed  with reciprocal gait pattern, longer stride length  in order to ambulate safely in community and return to fitness routine  Baseline:  L pelvic lateral shift, decreased R stance, limited R trunk posterior rotation  Goal status:MET  ( 07/08/24 MET)     4. Pt will demo levelled pelvic girdle and shoulder height  and demo B sideflexion measurements   in order to progress to deep core strengthening HEP and restore mobility at spine, pelvis, gait, posture minimize falls, and improve balance  Baseline:L shoulder. R iliac crest lowered ,  L sideflexion/ R digit III to floor: 56 cmL, 59 cm R  , limited R rotation  > L  Goal status:  MET  , 57 cm with sideflexion    5. Pt will improve PFDI-7 questionnaire to  pts  score change  to demo improved QOL  Baseline:    ( greater pts indicate greater negative impact on QOL)   83  pts  ( total)  24   pts  ( UIQ-7 )  10   pts  ( CRAIQ-7 )  10  pts  ( POPIQ-7 )  Baseline:  Goal status: INITIAL  6.  Pt will report complete urination without needing to return to toilet a second time > 50% of the time and/or pt reports she has to push prolapse up < 2x day to improve hygiene and function  Baseline: It is difficult to finish urination and return a second time to finish urination 30% of the time.  Pt reports she has push it back up 4 x day.  Goal status:   MET ( 6th  visit , 08/12/24: pt no longer has to urinate again, she is able to complete urination, pt is pushing prolapse up 4 x a week instead of 4 x day)      7. Pt will demo equal alignment of shoulder and improved AROM with overhead reaching, and able to dry hair with less pain < 5/10  Baseline:  Drying hair is difficult due limited range 9/10.  Reaching in cabinets, she avoid using R hand and has to use L.  Goal status: Ongoing    8. Pt reports L hip pain no longer awakens her at night to improve quality of sleep. Pt reports no more radiating L pain across 1 month  Baseline:  It awakens her in the middle of the night.  Dull persistent ache  in the evening. 8/10 . Radiating to the side of the lateral of leg  in a random pattern electric sensation.  Goal Status: MET   9.   Pt will regain more sensation of the feet with  Baseline: bottom and top of feet. Numbness constant  .  Pt wear arch support , Barefoot is not comfortable  Goal Status: INITIAL   Pia Lupe Plump, PT 08/12/2024, 8:59 AM

## 2024-08-12 NOTE — Patient Instructions (Addendum)
 Deep core level 1- ( handout)  _______________________________________________________________   Minisquat: Scoot buttocks back slight, hinge like you are looking at your reflection on a pond  Knees behind toes,  Inhale to smell flowers  Exhale on the rise like rocket  Do not lock knees, have more weight across ballmounds of feet, toes relaxed and spread them, not grip them   10 reps x 3 x day   __  Stretches :   Neck / shoulder stretches:    Lying on back - small sushi roll towel under neck  _ 6 directions   Chin up, down Rotation like changing lanes when driving Ear to shoulder like puppy dog   10 reps    _angel wings, lower elbows down , keep arms touching bed  10 reps    _________

## 2024-09-02 ENCOUNTER — Ambulatory Visit: Attending: Internal Medicine | Admitting: Physical Therapy

## 2024-09-02 DIAGNOSIS — M25511 Pain in right shoulder: Secondary | ICD-10-CM | POA: Insufficient documentation

## 2024-09-02 DIAGNOSIS — M25552 Pain in left hip: Secondary | ICD-10-CM | POA: Insufficient documentation

## 2024-09-02 DIAGNOSIS — M533 Sacrococcygeal disorders, not elsewhere classified: Secondary | ICD-10-CM | POA: Insufficient documentation

## 2024-09-02 DIAGNOSIS — R2689 Other abnormalities of gait and mobility: Secondary | ICD-10-CM | POA: Diagnosis not present

## 2024-09-02 DIAGNOSIS — G8929 Other chronic pain: Secondary | ICD-10-CM | POA: Insufficient documentation

## 2024-09-02 NOTE — Patient Instructions (Addendum)
 Stretch for pre and post walking    Mermaid stretch  Rocking while seated on the floor with heels to one side of the hip Heels to one side of the hip  Rock forward towards the knee that is bent , rock beck towards the opposite sitting bones 10 reps    ** put a folded towel under L buttock when ankles are on the left side  __    Hip abductor and hip flexors stretch at the same time    Alternative if hip is tight,   Sit at 45 deg turn with R leg and knee on edge of chair/ bench, L buttock hanging off the edge to bring the L foot back like a lunge, toes bent, lower heel to feel quad stretch,  pay attention to keeping pinky and first toe ballmound planted to align toes forward so ankles are not twerked   Repeat with other side   ___   Two standing by the wall:    Calf stretch of the back leg, front foot with toes against the wall   R Arm up against the wall with R back leg stretch ( same side )   Then lower the arm, twist to the L only torso  __  Feet care :  Self -feet massage   Handshake : fingers between toes, moving ballmounds/toes back and forth several times while other hand anchors at arch. Do the same at the hind/mid foot.  Heel to toes upward to a letter Big Letter T strokes to spread ballmounds and toes, several times, pinch between webs of toes  Run finger tips along top of foot between long bones comb between the bones    Wiggle toes and spread them out when relaxing

## 2024-09-02 NOTE — Therapy (Signed)
 OUTPATIENT PHYSICAL THERAPY  TREATMENT    Patient Name: Cindy Arnold MRN: [id number] DOB:[birth year], 73 y.o., female Today's Date: 09/02/2024   PT End of Session - 09/02/24 1026     Visit Number 7    Number of Visits 10    Date for PT Re-Evaluation 09/03/24    PT Start Time 1020    PT Stop Time 1100    PT Time Calculation (min) 40 min    Activity Tolerance Patient tolerated treatment well;No increased pain    Behavior During Therapy WFL for tasks assessed/performed          Past Medical History:  Diagnosis Date   Basal cell carcinoma    multiple removed/[facility]   Breast cancer (HCC) 12/13   s/p right lumpectomy, s/p chemo and XRT   DES exposure in utero    Hepatitis A    History of chicken pox    Hyperactive airway disease    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HCC)    Migraines    Miscarriage    x3   Past Surgical History:  Procedure Laterality Date   ABDOMINAL HYSTERECTOMY     BASAL CELL CARCINOMA EXCISION     Multiple   BREAST BIOPSY  1975   Cyst   BREAST LUMPECTOMY WITH AXILLARY LYMPH NODE DISSECTION  01/06/13, 01/22/13   Dr. Georgaide, [facility], second surgery for pos margin   CERVICAL FUSION  2001   COLONOSCOPY     COLONOSCOPY WITH PROPOFOL  N/A 06/11/2022   Procedure: COLONOSCOPY WITH PROPOFOL ;  Surgeon: Unk Corinn Skiff, MD;  Location: [facility] ENDOSCOPY;  Service: Gastroenterology;  Laterality: N/A;   DILATION AND CURETTAGE OF UTERUS     x 3   LAPAROSCOPIC BILATERAL SALPINGO OOPHERECTOMY Bilateral 12/21/2020   Procedure: LAPAROSCOPIC BILATERAL SALPINGO OOPHORECTOMY;  Surgeon: Ward, Mitzie BROCKS, MD;  Location: [facility] ORS;  Service: Gynecology;  Laterality: Bilateral;   VAGINAL DELIVERY     x4   WISDOM TOOTH EXTRACTION     Patient Active Problem List   Diagnosis Date Noted   Vaginal prolapse 03/16/2024   Hypotension 03/11/2024   Mass of soft tissue of neck 10/27/2023   Neuropathy 06/23/2023   Shoulder pain 05/16/2023   Abnormal liver function tests  02/17/2023   Osteoarthritis of right shoulder 11/03/2022   Persistent cough 10/10/2022   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   Numbness of toes 07/10/2022   Vitamin D  deficiency 07/10/2022   Paroxysmal SVT (supraventricular tachycardia) (HCC) 06/17/2022   Polyp of ascending colon    Polyp of cecum    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   Hypercholesterolemia 10/05/2021   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   Macular atrophy, 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   Macular RPE mottling 06/07/2021   Radial styloid tenosynovitis 03/15/2021   Bursitis of shoulder 03/15/2021   Acquired trigger finger 03/15/2021   Increased heart rate 02/14/2021   Hypokalemia 11/22/2020   Urinary urgency 01/10/2020   Bilateral carpal tunnel syndrome 01/23/2019   Osteoarthritis of facet joint of lumbar spine 10/20/2018   Left hip pain 10/04/2018   GERD (gastroesophageal reflux disease) 08/10/2018   Numbness of right hand 08/10/2018   Cervical arthritis 03/20/2018   Neck pain 02/04/2018   Bronchiolitis 11/19/2017   Adhesive capsulitis of right shoulder 07/04/2017   Lateral epicondylitis 04/23/2017   History of colonic polyps 02/12/2017   Cough in adult patient 11/14/2015   Cough 10/14/2015   Palpitations 01/18/2015   Anxiety 01/18/2015   Healthcare maintenance 11/05/2013   Osteopenia 10/06/2013  History of breast cancer 12/03/2012   Screening for breast cancer 09/24/2012   Migraines 02/14/2012   Asthma 02/14/2012   Insomnia 02/14/2012    PCP: Glendia Shad MD   REFERRING PROVIDER: Glendia Shad MD   REFERRING DIAG: Vaginal prolapse   Rationale for Evaluation and Treatment Rehabilitation  THERAPY DIAG:  Sacrococcygeal disorders, not elsewhere classified  Other abnormalities of gait and mobility  Pain in left hip  Chronic right shoulder pain  ONSET DATE:   SUBJECTIVE:       SUBJECTIVE STATEMENT  Improvements  include:  Prolapse: Pt reports prolapse is better to the  point it is not as noticeable and she has to move it back up into place one time a day or even sometimes not any compared to pt used to have to do it 4 x day .   Pt will be walking 4 x a week , at least 1.5 -2 miles. But pt does not have a stretching routine after walking. Pt noticed L hip bursitis is acting up again when she is sleeping. But it does not hurt with walking. The cortisone is wearing off.                                                    SUBJECTIVE STATEMENT on EVAL 06/25/24 :  1) Prolapse : Pt feels it is almost hanging outside 30% of the time. It occurred in Feb 2025.  It is difficult to finish urination and return a second time to finish urination 30% of the time.  Pt reports she has push it back up 4 x day. No difficulty with bowel movements.   2) R shoulder pain : started 1.5 years ago without injury. Not completed PT/. Had an MRI and was told it was arthritis in the joint. Drying hair is difficult due limited range 9/10.  Reaching in cabinets, she avoid using R hand and has to use L. Denied numbness and tingling down the arm   3) L hip pain: started in April without injury. Hurts with getting up and she has to adjust her position to not hurt.   It awakens her in the middle of the night.  Dull persistent ache  in the evening. 8/10 . Radiating to the side of the lateral of leg  in a random pattern electric sensation.   4) neuropathy in B feet occurs bottom and top of feet. Numbness constant  .  Pt wear arch support , Barefoot is not comfortable     PERTINENT HISTORY:  Breast biopsy  Date Unknown Abdominal hysterectomy Date Unknown Basal cell carcinoma excision  01/06/13, 01/22/13 Breast lumpectomy with axillary lymph node dissection     PAIN:  Are you having pain? Yes see above   PRECAUTIONS: None  WEIGHT BEARING RESTRICTIONS: No  FALLS:  Has patient fallen in last 6 months? 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 [employer]   PLOF: Independent  PATIENT GOALS:   To be able to stay active, become active ( was walking 3 x a week 3 miles), gardening, paying with grandchilden    OBJECTIVE:    [facility] PT Assessment - 09/02/24 1036  Sit to Stand   Comments good carry over with abducted knees but p      Other:   Other/Comments new hip stretch w/ limited FADDIR on L and required elevation of L hip      Palpation   SI assessment  levelled pelvis and shoulder height          [facility] Adult PT Treatment/Exercise - 09/02/24 1402       Self-Care   Other Self-Care Comments  reassessed goals , reinforced improvements with LKC and compliance with deep core HEP which is helping prolapse,. modifed new hip stretch for L hip pain      Neuro Re-ed    Neuro Re-ed Details  cued for stretches for LKC to complement walking routine and promote more hip mobility and feet  mobility, cued for feet massage to promote DF/EV, toe abduction , more knee abd/              HOME EXERCISE PROGRAM: See pt instruction section    ASSESSMENT:  CLINICAL IMPRESSION: Pt met 4/9 goals and progressing well towards remaining goals.    Improvements  include:   Prolapse: Pt reports prolapse is better to the point it is not as noticeable and she has to move it back up into place one time a day or even sometimes not any compared to pt used to have to do it 4 x day .   R shoulder pain: this area has not been addressed with PT yet      Neuropathy in B feet occurs bottom and top of feet. numbness  is only by toes and no longer by the heels. Pt is complaint with the feet mobility HEP.    Wean off her arch support stiff  orthotic inserts to minimize high arches and pt  uses propioceptive inserts that will not further increase high arches but improve feet mobility at fore /mid/ hind foot.   L hip pain no longer awakens  her at night to improve quality of sleep  . The cortisone has helped. Pt is compliant with performing squats and  sit to stand with improved alignment of knees and has improved DF/EV in ankles. However, this week, pt reports L hip pain  has returned   Added new hip mobility stretch today.  Modifed new hip stretch for L hip pain.                    Cued for more stretches for [facility] to complement walking routine and promote more hip mobility and feet  mobility, cued for feet massage to promote DF/EV, toe abduction , more knee abd. These LKC improvements continue to help minimize prolapse.  Pt reports this week,  prolapse is better to the point it is not as noticeable and she has to move it back up into place one time a day or even sometimes not any compared to pt used to have to do it 4 x day .  Encouraged pt to return to walking routine and maintain stretches to maintain proper LKC alignment and mobility in addition to upright posture and medially aligned shoulders/pelvis.      Plan to progress to Deep core level 2 at next session.     Regional interdependent approaches will yield greater benefits in pt's POC .  These improvements will help promote optimize IAP system for improved pelvic floor function, trunk stability, gait, balance, stabilization with mobility tasks.  Plan to address pelvic floor issues once B  feet numbness / LKC deficits improves  to yield better outcomes.                                                       Pt benefits from skilled PT.    OBJECTIVE IMPAIRMENTS decreased activity tolerance, decreased coordination, decreased endurance, decreased mobility, difficulty walking, decreased ROM, decreased strength, decreased safety awareness, hypomobility, increased muscle spasms, impaired flexibility, improper body mechanics, postural dysfunction, and pain. scar restrictions   ACTIVITY LIMITATIONS  self-care,  sleep, home chores, work tasks    PARTICIPATION LIMITATIONS:   community, caring for grandchildren activities    PERSONAL FACTORS   affecting patient's functional outcome:    REHAB POTENTIAL: Good   CLINICAL DECISION MAKING: Evolving/moderate complexity   EVALUATION COMPLEXITY: Moderate    PATIENT EDUCATION:    Education details: Showed pt anatomy images. Explained muscles attachments/ connection, physiology of deep core system/ spinal- thoracic-pelvis-lower kinetic chain as they relate to pt's presentation, Sx, and past Hx. Explained what and how these areas of deficits need to be restored to balance and function    See Therapeutic activity / neuromuscular re-education section  Answered pt's questions.   Person educated: Patient Education method: Explanation, Demonstration, Tactile cues, Verbal cues, and Handouts Education comprehension: verbalized understanding, returned demonstration, verbal cues required, tactile cues required, and needs further education     PLAN: PT FREQUENCY: 1x/week   PT DURATION: 10 weeks   PLANNED INTERVENTIONS:   Gait training;Stair training;Functional mobility training;DME Instruction;Therapeutic activities;Therapeutic exercise;Balance training;Neuromuscular re-education;Patient/family education;Vestibular;Visual/perceptual remediation/compensation;Passive range of motion;Moist Heat;Cryotherapy;Traction;Canalith Repostioning;Joint Manipulations;Manual lymph drainage;Manual techniques;Scar mobilization;Energy conservation;Dry needling;ADLs/Self Care Home Management;Biofeedback;Electrical Stimulation;Taping    PLAN FOR NEXT SESSION: See clinical impression for plan     GOALS: Goals reviewed with patient? Yes  SHORT TERM GOALS: Target date: 07/23/2024    Pt will demo IND with HEP                    Baseline: Not IND            Goal status: INITIAL   LONG TERM GOALS: Target date: 09/03/2024    1.Pt will demo proper deep core coordination without chest breathing and optimal excursion of diaphragm/pelvic  floor in order to promote spinal stability and pelvic floor function  Baseline: dyscoordination Goal status: Ongoing   2.  Pt will demo proper body mechanics in against gravity tasks and ADLs  work tasks, fitness  to minimize straining pelvic floor / back    Baseline: not IND, improper form that places strain on pelvic floor  Goal status: MET     3. Pt will demo increased gait speed  with reciprocal gait pattern, longer stride length  in order to ambulate safely in community and return to fitness routine  Baseline:  L pelvic lateral shift, decreased R stance, limited R trunk posterior rotation  Goal status:MET  ( 07/08/24 MET)     4. Pt will demo levelled pelvic girdle and shoulder height  and demo B sideflexion measurements   in order to progress to deep core strengthening HEP and restore mobility at spine, pelvis, gait, posture minimize falls, and improve balance  Baseline:L shoulder. R iliac crest lowered ,  L sideflexion/ R digit III to floor: 56 cmL, 59 cm R  , limited R rotation  > L  Goal  status:  MET  , 57 cm with sideflexion    5. Pt will improve PFDI-7 questionnaire to  pts  score change  to demo improved QOL  Baseline:    ( greater pts indicate greater negative impact on QOL)   83  pts  ( total)  24   pts  ( UIQ-7 )  10   pts  ( CRAIQ-7 )  10  pts  ( POPIQ-7 )  Baseline:  Goal status: INITIAL  6.  Pt will report complete urination without needing to return to toilet a second time > 50% of the time and/or pt reports she has to push prolapse up < 2x day to improve hygiene and function  Baseline: It is difficult to finish urination and return a second time to finish urination 30% of the time.  Pt reports she has push it back up 4 x day.  Goal status:   MET ( 6th visit , 08/12/24: pt no longer has to urinate again, she is able to complete urination, pt is pushing prolapse up 4 x a week instead of 4 x day)      7. Pt will demo equal alignment of shoulder and improved AROM  with overhead reaching, and able to dry hair with less pain < 5/10  Baseline:  Drying hair is difficult due limited range 9/10.  Reaching in cabinets, she avoid using R hand and has to use L.  Goal status: Ongoing    8. Pt reports L hip pain no longer awakens her at night to improve quality of sleep. Pt reports no more radiating L pain across 1 month  Baseline:  It awakens her in the middle of the night.  Dull persistent ache  in the evening. 8/10 . Radiating to the side of the lateral of leg  in a random pattern electric sensation.  Goal Status: MET   9.   Pt will regain more sensation of the feet with  Baseline: bottom and top of feet. Numbness constant  .  Pt wear arch support , Barefoot is not comfortable  Goal Status: INITIAL   Pia Lupe Plump, PT 09/02/2024, 10:28 AM

## 2024-09-15 ENCOUNTER — Ambulatory Visit: Admitting: Physical Therapy

## 2024-09-15 DIAGNOSIS — G8929 Other chronic pain: Secondary | ICD-10-CM

## 2024-09-15 DIAGNOSIS — M25552 Pain in left hip: Secondary | ICD-10-CM

## 2024-09-15 DIAGNOSIS — M533 Sacrococcygeal disorders, not elsewhere classified: Secondary | ICD-10-CM | POA: Diagnosis not present

## 2024-09-15 DIAGNOSIS — R2689 Other abnormalities of gait and mobility: Secondary | ICD-10-CM

## 2024-09-15 NOTE — Therapy (Unsigned)
 OUTPATIENT PHYSICAL THERAPY  TREATMENT    Patient Name: Cindy Arnold MRN: [id number] DOB:07-11-1951, 73 y.o., female Today's Date: 09/15/2024   PT End of Session - 09/15/24 1342     Visit Number 8    Number of Visits 10    Date for Recertification  09/03/24    PT Start Time 1335    PT Stop Time 1415    PT Time Calculation (min) 40 min    Activity Tolerance Patient tolerated treatment well;No increased pain    Behavior During Therapy WFL for tasks assessed/performed          Past Medical History:  Diagnosis Date   Basal cell carcinoma    multiple removed/[facility]   Breast cancer (HCC) 12/13   s/p right lumpectomy, s/p chemo and XRT   DES exposure in utero    Hepatitis A    History of chicken pox    Hyperactive airway disease    Lupus anticoagulant disorder    Migraines    Miscarriage    x3   Past Surgical History:  Procedure Laterality Date   ABDOMINAL HYSTERECTOMY     BASAL CELL CARCINOMA EXCISION     Multiple   BREAST BIOPSY  1975   Cyst   BREAST LUMPECTOMY WITH AXILLARY LYMPH NODE DISSECTION  01/06/13, 01/22/13   Dr. Georgaide, [facility], second surgery for pos margin   CERVICAL FUSION  2001   COLONOSCOPY     COLONOSCOPY WITH PROPOFOL  N/A 06/11/2022   Procedure: COLONOSCOPY WITH PROPOFOL ;  Surgeon: Unk Corinn Skiff, MD;  Location: [facility];  Service: Gastroenterology;  Laterality: N/A;   DILATION AND CURETTAGE OF UTERUS     x 3   LAPAROSCOPIC BILATERAL SALPINGO OOPHERECTOMY Bilateral 12/21/2020   Procedure: LAPAROSCOPIC BILATERAL SALPINGO OOPHORECTOMY;  Surgeon: Ward, Mitzie BROCKS, MD;  Location: [facility] OR[facility];  Service: Gynecology;  Laterality: Bilateral;   VAGINAL DELIVERY     x4   WISDOM TOOTH EXTRACTION     Patient Active Problem List   Diagnosis Date Noted   Vaginal prolapse 03/16/2024   Hypotension 03/11/2024   Mass of soft tissue of neck 10/27/2023   Neuropathy 06/23/2023   Shoulder pain 05/16/2023   Abnormal liver function tests  02/17/2023   Osteoarthritis of right shoulder 11/03/2022   Persistent cough 10/10/2022   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   Numbness of toes 07/10/2022   Vitamin D  deficiency 07/10/2022   Paroxysmal SVT (supraventricular tachycardia) 06/17/2022   Polyp of ascending colon    Polyp of cecum    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   Hypercholesterolemia 10/05/2021   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   Macular atrophy, 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   Macular RPE mottling 06/07/2021   Radial styloid tenosynovitis 03/15/2021   Bursitis of shoulder 03/15/2021   Acquired trigger finger 03/15/2021   Increased heart rate 02/14/2021   Hypokalemia 11/22/2020   Urinary urgency 01/10/2020   Bilateral carpal tunnel syndrome 01/23/2019   Osteoarthritis of facet joint of lumbar spine 10/20/2018   Left hip pain 10/04/2018   GERD (gastroesophageal reflux disease) 08/10/2018   Numbness of right hand 08/10/2018   Cervical arthritis 03/20/2018   Neck pain 02/04/2018   Bronchiolitis 11/19/2017   Adhesive capsulitis of right shoulder 07/04/2017   Lateral epicondylitis 04/23/2017   History of colonic polyps 02/12/2017   Cough in adult patient 11/14/2015   Cough 10/14/2015   Palpitations 01/18/2015   Anxiety 01/18/2015   Healthcare maintenance 11/05/2013   Osteopenia 10/06/2013   History of  breast cancer 12/03/2012   Screening for breast cancer 09/24/2012   Migraines 02/14/2012   Asthma 02/14/2012   Insomnia 02/14/2012    PCP: Glendia Shad MD   REFERRING PROVIDER: Glendia Shad MD   REFERRING DIAG: Vaginal prolapse   Rationale for Evaluation and Treatment Rehabilitation  THERAPY DIAG:  Sacrococcygeal disorders, not elsewhere classified  Other abnormalities of gait and mobility  Chronic right shoulder pain  Pain in left hip  ONSET DATE:   SUBJECTIVE:       SUBJECTIVE STATEMENT  Improvements  include:  Prolapse:   L hip pain:  Pain has not returned the  same way as before even when cortisone shot wore off.  Pt is no longer woken up by her hip pain anymore   Pt has returned to walking routine. Pt was not as diligent with HEP last week due to caring for grandchildren and life    SUBJECTIVE STATEMENT on EVAL 06/25/24 :  1) Prolapse : Pt feels it is almost hanging outside 30% of the time. It occurred in Feb 2025.  It is difficult to finish urination and return a second time to finish urination 30% of the time.  Pt reports she has push it back up 4 x day. No difficulty with bowel movements.   2) R shoulder pain : started 1.5 years ago without injury. Not completed PT/. Had an MRI and was told it was arthritis in the joint. Drying hair is difficult due limited range 9/10.  Reaching in cabinets, she avoid using R hand and has to use L. Denied numbness and tingling down the arm   3) L hip pain: started in April without injury. Hurts with getting up and she has to adjust her position to not hurt.   It awakens her in the middle of the night.  Dull persistent ache  in the evening. 8/10 . Radiating to the side of the lateral of leg  in a random pattern electric sensation.   4) neuropathy in B feet occurs bottom and top of feet. Numbness constant  .  Pt wear arch support , Barefoot is not comfortable     PERTINENT HISTORY:  Breast biopsy  Date Unknown Abdominal hysterectomy Date Unknown Basal cell carcinoma excision  01/06/13, 01/22/13 Breast lumpectomy with axillary lymph node dissection     PAIN:  Are you having pain? Yes see above   PRECAUTIONS: None  WEIGHT BEARING RESTRICTIONS: No  FALLS:  Has patient fallen in last 6 months? No  LIVING ENVIRONMENT: Lives with: husband  Lives in: 2 story  Stairs: 1 STE w/o rail  Has following equipment at home: No   OCCUPATION:  retired from [employer] for [employer]   PLOF: Independent  PATIENT GOALS:   To be able to stay active, become active ( was walking 3 x a week 3  miles), gardening, paying with grandchilden    OBJECTIVE:          HOME EXERCISE PROGRAM: See pt instruction section    ASSESSMENT:  CLINICAL IMPRESSION: Pt met 4/9 goals and progressing well towards remaining goals.    Improvements  include:   Prolapse: Pt reports prolapse is better to the point it is not as noticeable and she has to move it back up into place one time a day or even sometimes not any compared to pt used to have to do it 4 x day .   R shoulder pain: this area has not been addressed with PT yet  Neuropathy in B feet occurs bottom and top of feet. numbness  is only by toes and no longer by the heels. Pt is complaint with the feet mobility HEP.    Wean off her arch support stiff  orthotic inserts to minimize high arches and pt  uses propioceptive inserts that will not further increase high arches but improve feet mobility at fore /mid/ hind foot.                   Plan to progress to Deep core level 2 at next session.     Regional interdependent approaches will yield greater benefits in pt's POC .  These improvements will help promote optimize IAP system for improved pelvic floor function, trunk stability, gait, balance, stabilization with mobility tasks.  Plan to address pelvic floor issues once B feet numbness / LKC deficits improves  to yield better outcomes.                                                       Pt benefits from skilled PT.    OBJECTIVE IMPAIRMENTS decreased activity tolerance, decreased coordination, decreased endurance, decreased mobility, difficulty walking, decreased ROM, decreased strength, decreased safety awareness, hypomobility, increased muscle spasms, impaired flexibility, improper body mechanics, postural dysfunction, and pain. scar restrictions   ACTIVITY LIMITATIONS  self-care,  sleep, home chores, work tasks    PARTICIPATION LIMITATIONS:  community, caring for grandchildren activities    PERSONAL FACTORS    affecting patient's functional outcome:    REHAB POTENTIAL: Good   CLINICAL DECISION MAKING: Evolving/moderate complexity   EVALUATION COMPLEXITY: Moderate    PATIENT EDUCATION:    Education details: Showed pt anatomy images. Explained muscles attachments/ connection, physiology of deep core system/ spinal- thoracic-pelvis-lower kinetic chain as they relate to pt's presentation, Sx, and past Hx. Explained what and how these areas of deficits need to be restored to balance and function    See Therapeutic activity / neuromuscular re-education section  Answered pt's questions.   Person educated: Patient Education method: Explanation, Demonstration, Tactile cues, Verbal cues, and Handouts Education comprehension: verbalized understanding, returned demonstration, verbal cues required, tactile cues required, and needs further education     PLAN: PT FREQUENCY: 1x/week   PT DURATION: 10 weeks   PLANNED INTERVENTIONS:   Gait training;Stair training;Functional mobility training;DME Instruction;Therapeutic activities;Therapeutic exercise;Balance training;Neuromuscular re-education;Patient/family education;Vestibular;Visual/perceptual remediation/compensation;Passive range of motion;Moist Heat;Cryotherapy;Traction;Canalith Repostioning;Joint Manipulations;Manual lymph drainage;Manual techniques;Scar mobilization;Energy conservation;Dry needling;ADLs/Self Care Home Management;Biofeedback;Electrical Stimulation;Taping    PLAN FOR NEXT SESSION: See clinical impression for plan     GOALS: Goals reviewed with patient? Yes  SHORT TERM GOALS: Target date: 07/23/2024    Pt will demo IND with HEP                    Baseline: Not IND            Goal status: INITIAL   LONG TERM GOALS: Target date: 09/03/2024    1.Pt will demo proper deep core coordination without chest breathing and optimal excursion of diaphragm/pelvic floor in order to promote spinal stability and pelvic floor function   Baseline: dyscoordination Goal status: Ongoing   2.  Pt will demo proper body mechanics in against gravity tasks and ADLs  work tasks, fitness  to minimize straining pelvic floor / back    Baseline:  not IND, improper form that places strain on pelvic floor  Goal status: MET     3. Pt will demo increased gait speed  with reciprocal gait pattern, longer stride length  in order to ambulate safely in community and return to fitness routine  Baseline:  L pelvic lateral shift, decreased R stance, limited R trunk posterior rotation  Goal status:MET  ( 07/08/24 MET)     4. Pt will demo levelled pelvic girdle and shoulder height  and demo B sideflexion measurements   in order to progress to deep core strengthening HEP and restore mobility at spine, pelvis, gait, posture minimize falls, and improve balance  Baseline:L shoulder. R iliac crest lowered ,  L sideflexion/ R digit III to floor: 56 cmL, 59 cm R  , limited R rotation  > L  Goal status:  MET  , 57 cm with sideflexion    5. Pt will improve PFDI-7 questionnaire to  pts  score change  to demo improved QOL  Baseline:    ( greater pts indicate greater negative impact on QOL)   83  pts  ( total)  24   pts  ( UIQ-7 )  10   pts  ( CRAIQ-7 )  10  pts  ( POPIQ-7 )  Baseline:  Goal status: INITIAL  6.  Pt will report complete urination without needing to return to toilet a second time > 50% of the time and/or pt reports she has to push prolapse up < 2x day to improve hygiene and function  Baseline: It is difficult to finish urination and return a second time to finish urination 30% of the time.  Pt reports she has push it back up 4 x day.  Goal status:   MET ( 6th visit , 08/12/24: pt no longer has to urinate again, she is able to complete urination, pt is pushing prolapse up 4 x a week instead of 4 x day)      7. Pt will demo equal alignment of shoulder and improved AROM with overhead reaching, and able to dry hair with less pain < 5/10   Baseline:  Drying hair is difficult due limited range 9/10.  Reaching in cabinets, she avoid using R hand and has to use L.  Goal status: Ongoing    8. Pt reports L hip pain no longer awakens her at night to improve quality of sleep. Pt reports no more radiating L pain across 1 month  Baseline:  It awakens her in the middle of the night.  Dull persistent ache  in the evening. 8/10 . Radiating to the side of the lateral of leg  in a random pattern electric sensation.  Goal Status: MET   9.   Pt will regain more sensation of the feet with  Baseline: bottom and top of feet. Numbness constant  .  Pt wear arch support , Barefoot is not comfortable  Goal Status: INITIAL   Pia Lupe Plump, PT 09/15/2024, 1:43 PM

## 2024-09-18 ENCOUNTER — Other Ambulatory Visit: Payer: Self-pay | Admitting: Internal Medicine

## 2024-09-24 ENCOUNTER — Ambulatory Visit: Admitting: Physical Therapy

## 2024-09-25 ENCOUNTER — Other Ambulatory Visit

## 2024-09-28 DIAGNOSIS — H3589 Other specified retinal disorders: Secondary | ICD-10-CM | POA: Diagnosis not present

## 2024-09-28 DIAGNOSIS — H35052 Retinal neovascularization, unspecified, left eye: Secondary | ICD-10-CM | POA: Diagnosis not present

## 2024-10-01 ENCOUNTER — Ambulatory Visit: Admitting: Physical Therapy

## 2024-10-08 ENCOUNTER — Ambulatory Visit: Admitting: Physical Therapy

## 2024-10-09 ENCOUNTER — Other Ambulatory Visit: Payer: Self-pay | Admitting: *Deleted

## 2024-10-09 DIAGNOSIS — E7800 Pure hypercholesterolemia, unspecified: Secondary | ICD-10-CM

## 2024-10-09 DIAGNOSIS — R7989 Other specified abnormal findings of blood chemistry: Secondary | ICD-10-CM

## 2024-10-10 ENCOUNTER — Other Ambulatory Visit: Payer: Self-pay | Admitting: Internal Medicine

## 2024-10-10 DIAGNOSIS — E7800 Pure hypercholesterolemia, unspecified: Secondary | ICD-10-CM

## 2024-10-10 NOTE — Progress Notes (Signed)
Order placed for future lab

## 2024-10-12 ENCOUNTER — Encounter: Payer: Self-pay | Admitting: Internal Medicine

## 2024-10-15 ENCOUNTER — Ambulatory Visit: Attending: Internal Medicine | Admitting: Physical Therapy

## 2024-10-15 ENCOUNTER — Telehealth (INDEPENDENT_AMBULATORY_CARE_PROVIDER_SITE_OTHER): Admitting: Nurse Practitioner

## 2024-10-15 ENCOUNTER — Encounter: Payer: Self-pay | Admitting: Nurse Practitioner

## 2024-10-15 VITALS — BP 110/74 | Ht 68.5 in | Wt 150.0 lb

## 2024-10-15 DIAGNOSIS — M533 Sacrococcygeal disorders, not elsewhere classified: Secondary | ICD-10-CM | POA: Diagnosis not present

## 2024-10-15 DIAGNOSIS — G8929 Other chronic pain: Secondary | ICD-10-CM | POA: Diagnosis not present

## 2024-10-15 DIAGNOSIS — J069 Acute upper respiratory infection, unspecified: Secondary | ICD-10-CM | POA: Diagnosis not present

## 2024-10-15 DIAGNOSIS — M25552 Pain in left hip: Secondary | ICD-10-CM | POA: Insufficient documentation

## 2024-10-15 DIAGNOSIS — R2689 Other abnormalities of gait and mobility: Secondary | ICD-10-CM | POA: Diagnosis not present

## 2024-10-15 DIAGNOSIS — M25511 Pain in right shoulder: Secondary | ICD-10-CM | POA: Insufficient documentation

## 2024-10-15 MED ORDER — AMOXICILLIN-POT CLAVULANATE 875-125 MG PO TABS: 1.0000 | ORAL_TABLET | Freq: Two times a day (BID) | ORAL | 0 refills | Status: DC

## 2024-10-15 MED ORDER — FLUTICASONE PROPIONATE 50 MCG/ACT NA SUSP: 2.0000 | Freq: Two times a day (BID) | NASAL | 0 refills | Status: DC

## 2024-10-15 MED ORDER — HYDROCOD POLI-CHLORPHE POLI ER 10-8 MG/5ML PO SUER: 5.0000 mL | Freq: Every evening | ORAL | 0 refills | Status: DC | PRN

## 2024-10-15 NOTE — Therapy (Signed)
 OUTPATIENT PHYSICAL THERAPY  TREATMENT  /  Discharge Summary across 9 visits    Patient Name: Cindy Arnold MRN: [id number] DOB:Jan 15, 1951, 73 y.o., female Today's Date: 10/15/2024     PT End of Session - 10/15/24 0942     Visit Number 9    Number of Visits 10    Date for Recertification  11/24/24    PT Start Time 0937    PT Stop Time 1015    PT Time Calculation (min) 38 min    Activity Tolerance Patient tolerated treatment well;No increased pain    Behavior During Therapy WFL for tasks assessed/performed            Past Medical History:  Diagnosis Date   Basal cell carcinoma    multiple removed/[facility]   Breast cancer (HCC) 12/13   s/p right lumpectomy, s/p chemo and XRT   DES exposure in utero    Hepatitis A    History of chicken pox    Hyperactive airway disease    Lupus anticoagulant disorder    Migraines    Miscarriage    x3   Past Surgical History:  Procedure Laterality Date   ABDOMINAL HYSTERECTOMY     BASAL CELL CARCINOMA EXCISION     Multiple   BREAST BIOPSY  1975   Cyst   BREAST LUMPECTOMY WITH AXILLARY LYMPH NODE DISSECTION  01/06/13, 01/22/13   Dr. Georgaide, [facility], second surgery for pos margin   CERVICAL FUSION  2001   COLONOSCOPY     COLONOSCOPY WITH PROPOFOL  N/A 06/11/2022   Procedure: COLONOSCOPY WITH PROPOFOL ;  Surgeon: Unk Corinn Skiff, MD;  Location: [facility];  Service: Gastroenterology;  Laterality: N/A;   DILATION AND CURETTAGE OF UTERUS     x 3   LAPAROSCOPIC BILATERAL SALPINGO OOPHERECTOMY Bilateral 12/21/2020   Procedure: LAPAROSCOPIC BILATERAL SALPINGO OOPHORECTOMY;  Surgeon: Ward, Mitzie BROCKS, MD;  Location: [facility] ORS;  Service: Gynecology;  Laterality: Bilateral;   VAGINAL DELIVERY     x4   WISDOM TOOTH EXTRACTION     Patient Active Problem List   Diagnosis Date Noted   Vaginal prolapse 03/16/2024   Hypotension 03/11/2024   Mass of soft tissue of neck 10/27/2023   Neuropathy 06/23/2023   Shoulder pain  05/16/2023   Abnormal liver function tests 02/17/2023   Osteoarthritis of right shoulder 11/03/2022   Persistent cough 10/10/2022   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   Numbness of toes 07/10/2022   Vitamin D  deficiency 07/10/2022   Paroxysmal SVT (supraventricular tachycardia) 06/17/2022   Polyp of ascending colon    Polyp of cecum    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   Hypercholesterolemia 10/05/2021   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   Macular atrophy, 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   Macular RPE mottling 06/07/2021   Radial styloid tenosynovitis 03/15/2021   Bursitis of shoulder 03/15/2021   Acquired trigger finger 03/15/2021   Increased heart rate 02/14/2021   Hypokalemia 11/22/2020   Urinary urgency 01/10/2020   Bilateral carpal tunnel syndrome 01/23/2019   Osteoarthritis of facet joint of lumbar spine 10/20/2018   Left hip pain 10/04/2018   GERD (gastroesophageal reflux disease) 08/10/2018   Numbness of right hand 08/10/2018   Cervical arthritis 03/20/2018   Neck pain 02/04/2018   Bronchiolitis 11/19/2017   Adhesive capsulitis of right shoulder 07/04/2017   Lateral epicondylitis 04/23/2017   History of colonic polyps 02/12/2017   Cough in adult patient 11/14/2015   Cough 10/14/2015   Palpitations 01/18/2015   Anxiety 01/18/2015  Healthcare maintenance 11/05/2013   Osteopenia 10/06/2013   History of breast cancer 12/03/2012   Screening for breast cancer 09/24/2012   Migraines 02/14/2012   Asthma 02/14/2012   Insomnia 02/14/2012    PCP: Glendia Shad MD   REFERRING PROVIDER: Glendia Shad MD   REFERRING DIAG: Vaginal prolapse   Rationale for Evaluation and Treatment Rehabilitation  THERAPY DIAG:  Sacrococcygeal disorders, not elsewhere classified  Other abnormalities of gait and mobility  Chronic right shoulder pain  Pain in left hip  ONSET DATE:   SUBJECTIVE:       SUBJECTIVE STATEMENT  Pt has had a respiratory infection    Despite getting sick with coughing, pt did not experience prolapse worsening.    SUBJECTIVE STATEMENT on EVAL 06/25/24 :  1) Prolapse : Pt feels it is almost hanging outside 30% of the time. It occurred in Feb 2025.  It is difficult to finish urination and return a second time to finish urination 30% of the time.  Pt reports she has push it back up 4 x day. No difficulty with bowel movements.   2) R shoulder pain : started 1.5 years ago without injury. Not completed PT/. Had an MRI and was told it was arthritis in the joint. Drying hair is difficult due limited range 9/10.  Reaching in cabinets, she avoid using R hand and has to use L. Denied numbness and tingling down the arm   3) L hip pain: started in April without injury. Hurts with getting up and she has to adjust her position to not hurt.   It awakens her in the middle of the night.  Dull persistent ache  in the evening. 8/10 . Radiating to the side of the lateral of leg  in a random pattern electric sensation.   4) neuropathy in B feet occurs bottom and top of feet. Numbness constant  .  Pt wear arch support , Barefoot is not comfortable     PERTINENT HISTORY:  Breast biopsy  Date Unknown Abdominal hysterectomy Date Unknown Basal cell carcinoma excision  01/06/13, 01/22/13 Breast lumpectomy with axillary lymph node dissection     PAIN:  Are you having pain? Yes see above   PRECAUTIONS: None  WEIGHT BEARING RESTRICTIONS: No  FALLS:  Has patient fallen in last 6 months? No  LIVING ENVIRONMENT: Lives with: husband  Lives in: 2 story  Stairs: 1 STE w/o rail  Has following equipment at home: No   OCCUPATION:  retired from [employer] for [employer]   PLOF: Independent  PATIENT GOALS:   To be able to stay active, become active ( was walking 3 x a week 3 miles), gardening, paying with grandchilden    OBJECTIVE:         HOME EXERCISE PROGRAM: See pt instruction section     ASSESSMENT:  CLINICAL IMPRESSION:   Pt met 9/9 goals .    Improvements  include:   Prolapse: Pt reports prolapse is better to the point it is not as noticeable and she has to move it back up into place one time a day or even sometimes not any compared to pt used to have to do it 4 x day . Despite getting sick with coughing, pt did not experience prolapse worsening.  PFDI questionnaire improved significantly indicating pelvic function  R shoulder pain: this area has not been addressed with PT yet      Neuropathy in B feet occurs bottom and top of feet. numbness  is only  by toes and no longer by the heels. able to walk barefeet.  With more comfort and confidence Pt is complaint with the feet mobility HEP.    Wean off her arch support stiff  orthotic inserts to minimize high arches and pt  uses propioceptive inserts that will not further increase high arches but improve feet mobility at fore /mid/ hind foot.   L hip pain:  Pain has not returned the same way as before even when cortisone shot wore off.  Pt is no longer woken up by her hip pain anymore   Pt has returned to walking routine.             These improvements helped to optimize IAP system for improved pelvic floor function, trunk stability, gait, balance, stabilization with mobility tasks.  Pt is ready for d/c       OBJECTIVE IMPAIRMENTS decreased activity tolerance, decreased coordination, decreased endurance, decreased mobility, difficulty walking, decreased ROM, decreased strength, decreased safety awareness, hypomobility, increased muscle spasms, impaired flexibility, improper body mechanics, postural dysfunction, and pain. scar restrictions   ACTIVITY LIMITATIONS  self-care,  sleep, home chores, work tasks    PARTICIPATION LIMITATIONS:  community, caring for grandchildren activities    PERSONAL FACTORS   affecting patient's functional outcome:    REHAB POTENTIAL: Good   CLINICAL DECISION MAKING:  Evolving/moderate complexity   EVALUATION COMPLEXITY: Moderate    PATIENT EDUCATION:    Education details: Showed pt anatomy images. Explained muscles attachments/ connection, physiology of deep core system/ spinal- thoracic-pelvis-lower kinetic chain as they relate to pt's presentation, Sx, and past Hx. Explained what and how these areas of deficits need to be restored to balance and function    See Therapeutic activity / neuromuscular re-education section  Answered pt's questions.   Person educated: Patient Education method: Explanation, Demonstration, Tactile cues, Verbal cues, and Handouts Education comprehension: verbalized understanding, returned demonstration, verbal cues required, tactile cues required, and needs further education     PLAN: PT FREQUENCY: 1x/week   PT DURATION: 10 weeks   PLANNED INTERVENTIONS:   Gait training;Stair training;Functional mobility training;DME Instruction;Therapeutic activities;Therapeutic exercise;Balance training;Neuromuscular re-education;Patient/family education;Vestibular;Visual/perceptual remediation/compensation;Passive range of motion;Moist Heat;Cryotherapy;Traction;Canalith Repostioning;Joint Manipulations;Manual lymph drainage;Manual techniques;Scar mobilization;Energy conservation;Dry needling;ADLs/Self Care Home Management;Biofeedback;Electrical Stimulation;Taping    PLAN FOR NEXT SESSION: See clinical impression for plan     GOALS: Goals reviewed with patient? Yes  SHORT TERM GOALS: Target date: 07/23/2024    Pt will demo IND with HEP                    Baseline: Not IND            Goal status: MET   LONG TERM GOALS: Target date: 11/24/2024      1.Pt will demo proper deep core coordination without chest breathing and optimal excursion of diaphragm/pelvic floor in order to promote spinal stability and pelvic floor function  Baseline: dyscoordination Goal status: MET  2.  Pt will demo proper body mechanics in against  gravity tasks and ADLs  work tasks, fitness  to minimize straining pelvic floor / back    Baseline: not IND, improper form that places strain on pelvic floor  Goal status: MET     3. Pt will demo increased gait speed  with reciprocal gait pattern, longer stride length  in order to ambulate safely in community and return to fitness routine  Baseline:  L pelvic lateral shift, decreased R stance, limited R trunk posterior rotation  Goal status:MET  (  07/08/24 MET)     4. Pt will demo levelled pelvic girdle and shoulder height  and demo B sideflexion measurements   in order to progress to deep core strengthening HEP and restore mobility at spine, pelvis, gait, posture minimize falls, and improve balance  Baseline:L shoulder. R iliac crest lowered ,  L sideflexion/ R digit III to floor: 56 cmL, 59 cm R  , limited R rotation  > L  Goal status:  MET  , 57 cm with sideflexion    5. Pt will improve PFDI-7 questionnaire to  pts  score change  to demo improved QOL  Baseline:    ( greater pts indicate greater negative impact on QOL)   83  pts  ( total)  --> 14 pts   24   pts  ( UIQ-7 )    -> 0 pts  10   pts  ( CRAIQ-7 )  --> 0 pts  10  pts  ( POPIQ-7 )   --> 14 pts   Baseline:  Goal status: MET   6.  Pt will report complete urination without needing to return to toilet a second time > 50% of the time and/or pt reports she has to push prolapse up < 2x day to improve hygiene and function  Baseline: It is difficult to finish urination and return a second time to finish urination 30% of the time.  Pt reports she has push it back up 4 x day.  Goal status:   MET ( 6th visit , 08/12/24: pt no longer has to urinate again, she is able to complete urination, pt is pushing prolapse up 4 x a week instead of 4 x day)      7. Pt will demo equal alignment of shoulder and improved AROM with overhead reaching, and able to dry hair with less pain < 5/10  Baseline:  Drying hair is difficult due limited range  9/10.  Reaching in cabinets, she avoid using R hand and has to use L.  Goal status: MET   8. Pt reports L hip pain no longer awakens her at night to improve quality of sleep. Pt reports no more radiating L pain across 1 month  Baseline:  It awakens her in the middle of the night.  Dull persistent ache  in the evening. 8/10 . Radiating to the side of the lateral of leg  in a random pattern electric sensation.  Goal Status: MET   9.   Pt will regain more sensation of the feet with  Baseline: bottom and top of feet. Numbness constant  .  Pt wear arch support , Barefoot is not comfortable  Goal Status: MET  , able to walk barefeet  With more comfort and confidence  Pia Lupe Plump, PT 10/15/2024, 9:43 AM

## 2024-10-15 NOTE — Progress Notes (Signed)
 Virtual Visit via Video Note  I connected with Cindy Arnold on 10/15/24 by a video enabled telemedicine application and verified that I am speaking with the correct person using two identifiers.  Patient Location: Home Provider Location: Office/Clinic  I discussed the limitations, risks, security, and privacy concerns of performing an evaluation and management service by video and the availability of in person appointments. I also discussed with the patient that there may be a patient responsible charge related to this service. The patient expressed understanding and agreed to proceed.  Subjective: PCP: Glendia Shad, MD  Chief Complaint  Patient presents with   Acute Visit    Deep cough, head and chest congestion x 2 weeks Congestion increases as the day goes on   HPI  Cindy Arnold is a 73 year old female with hyperactive airway disease who presents with a persistent cough and nasal congestion.  She has experienced a deep cough for two weeks, starting three days before attending a wedding. The cough persists despite using Mucinex. A tickling sensation in her throat worsens with talking, and a 'feather-like' sensation affects her airway at night.  Postnasal drip leads to a sore throat upon waking, resolving during the day. Nasal congestion increases in the evenings, and she uses Afrin at night. She has difficulty breathing through her nose, especially at night, causing mouth breathing and coughing fits.  She uses an albuterol  inhaler and has taken out-of-date codeine at night for sleep relief. Congestion initially affected her throat and has progressed to head congestion.  ROS: Per HPI  Current Outpatient Medications:    albuterol  (VENTOLIN  HFA) 108 (90 Base) MCG/ACT inhaler, INHALE 2 PUFFS INTO THE LUNGS EVERY 6 HOURS AS NEEDED FOR WHEEZING OR SHORTNESS OF BREATH., Disp: 18 g, Rfl: 1   alendronate  (FOSAMAX ) 70 MG tablet, TAKE 1 TABLET EVERY 7 DAYS WITH A FULL GLASS OF WATER  ON AN EMPTY STOMACH DO NOT LIE DOWN FOR AT LEAST 30 MIN, Disp: 12 tablet, Rfl: 3   amoxicillin -clavulanate (AUGMENTIN ) 875-125 MG tablet, Take 1 tablet by mouth 2 (two) times daily., Disp: 20 tablet, Rfl: 0   BIOTIN PO, Take 10,000 mg by mouth daily., Disp: , Rfl:    butalbital -aspirin -caffeine  (FIORINAL ) 50-325-40 MG tablet, Take 1 tablet by mouth daily as needed for headache., Disp: 30 tablet, Rfl: 0   chlorpheniramine-HYDROcodone  (TUSSIONEX) 10-8 MG/5ML, Take 5 mLs by mouth at bedtime as needed., Disp: 70 mL, Rfl: 0   Cholecalciferol (VITAMIN D3) 125 MCG (5000 UT) CAPS, Take 5,000 Units by mouth daily., Disp: , Rfl:    EVENING PRIMROSE OIL PO, Take 1,300 mg by mouth., Disp: , Rfl:    fluticasone  (FLONASE ) 50 MCG/ACT nasal spray, Place 2 sprays into both nostrils 2 (two) times daily for 7 days., Disp: 9.9 mL, Rfl: 0   Glucosamine-Chondroitin (MOVE FREE PO), Take 1 capsule by mouth daily. Cartilage/boron/hyalauronic acid, Disp: , Rfl:    ipratropium (ATROVENT ) 0.06 % nasal spray, SPRAY TWICE INTO EACH NOSTRIL TWICE DAILY, Disp: 30 mL, Rfl: 2   LORazepam  (ATIVAN ) 0.5 MG tablet, Take 1 tablet (0.5 mg total) by mouth daily as needed. for anxiety, Disp: 30 tablet, Rfl: 0   MAGNESIUM BISGLYCINATE PO, Take 200 mg by mouth daily., Disp: , Rfl:    meloxicam (MOBIC) 7.5 MG tablet, Take 7.5 mg by mouth 2 (two) times daily., Disp: , Rfl:    Multiple Minerals-Vitamins (CALCIUM  CITRATE PLUS/MAGNESIUM PO), Take 2 tablets by mouth 2 (two) times daily. CAL CITRATE 500-MAGNESIUM  80-ZINC-10, Disp: , Rfl:    Omega-3 Fatty Acids (OMEGA-3 FISH OIL PO), Take 1,280 mg by mouth 2 (two) times daily., Disp: , Rfl:    pantoprazole  (PROTONIX ) 20 MG tablet, TAKE 1 TABLET BY MOUTH TWICE A DAY BEFORE A MEAL., Disp: 180 tablet, Rfl: 3   propranolol  (INDERAL ) 20 MG tablet, Take 1 tablet (20 mg total) by mouth daily., Disp: 90 tablet, Rfl: 3   Riboflavin (VITAMIN B2 PO), Take 250 mg by mouth daily., Disp: , Rfl:    sertraline   (ZOLOFT ) 50 MG tablet, TAKE 1 TABLET BY MOUTH DAILY., Disp: 90 tablet, Rfl: 1   traZODone  (DESYREL ) 50 MG tablet, TAKE 1 TABLET BY MOUTH NIGHTLY AS NEEDEDFOR SLEEP., Disp: 30 tablet, Rfl: 1   rosuvastatin  (CRESTOR ) 10 MG tablet, TAKE 1 TABLET BY MOUTH DAILY, Disp: 30 tablet, Rfl: 0  Observations/Objective: Today's Vitals   10/15/24 1553  BP: 110/74  Weight: 150 lb (68 kg)  Height: 5' 8.5 (1.74 m)   Physical Exam Constitutional:      General: She is not in acute distress.    Appearance: Normal appearance. She is not ill-appearing.  Eyes:     Conjunctiva/sclera: Conjunctivae normal.  Pulmonary:     Effort: No respiratory distress.  Neurological:     General: No focal deficit present.     Mental Status: She is alert and oriented to person, place, and time.  Psychiatric:        Mood and Affect: Mood normal.        Behavior: Behavior normal.        Thought Content: Thought content normal.     Assessment and Plan: URI with cough and congestion Assessment & Plan: Persistent cough and postnasal drip causing sleep disturbance. No fever. History of hyperactive airway disease.  - Prescribed Augmentin , Flonase  nasal spray twice daily and Tussionex for nighttime cough. - Advised steam inhalation and humidifier use. - Encouraged increased fluid intake. - Recommended neti pot for nasal congestion. - She will let us  know if symptoms do not improve.   Other orders -     Amoxicillin -Pot Clavulanate; Take 1 tablet by mouth 2 (two) times daily.  Dispense: 20 tablet; Refill: 0 -     Fluticasone  Propionate; Place 2 sprays into both nostrils 2 (two) times daily for 7 days.  Dispense: 9.9 mL; Refill: 0 -     Hydrocod Poli-Chlorphe Poli ER; Take 5 mLs by mouth at bedtime as needed.  Dispense: 70 mL; Refill: 0    Follow Up Instructions: Return if symptoms worsen or fail to improve.   I discussed the assessment and treatment plan with the patient. The patient was provided an opportunity to  ask questions, and all were answered. The patient agreed with the plan and demonstrated an understanding of the instructions.   The patient was advised to call back or seek an in-person evaluation if the symptoms worsen or if the condition fails to improve as anticipated.  The above assessment and management plan was discussed with the patient. The patient verbalized understanding of and has agreed to the management plan.   Victorine Mcnee, NP

## 2024-10-16 ENCOUNTER — Other Ambulatory Visit: Payer: Self-pay | Admitting: Internal Medicine

## 2024-10-22 ENCOUNTER — Ambulatory Visit: Admitting: Physical Therapy

## 2024-10-22 NOTE — Assessment & Plan Note (Signed)
 Persistent cough and postnasal drip causing sleep disturbance. No fever. History of hyperactive airway disease.  - Prescribed Augmentin , Flonase  nasal spray twice daily and Tussionex for nighttime cough. - Advised steam inhalation and humidifier use. - Encouraged increased fluid intake. - Recommended neti pot for nasal congestion. - She will let us  know if symptoms do not improve.

## 2024-10-23 ENCOUNTER — Other Ambulatory Visit (INDEPENDENT_AMBULATORY_CARE_PROVIDER_SITE_OTHER)

## 2024-10-23 DIAGNOSIS — R7989 Other specified abnormal findings of blood chemistry: Secondary | ICD-10-CM

## 2024-10-23 DIAGNOSIS — E7800 Pure hypercholesterolemia, unspecified: Secondary | ICD-10-CM

## 2024-10-23 LAB — CBC WITH DIFFERENTIAL/PLATELET
Basophils Absolute: 0.1 K/uL (ref 0.0–0.1)
Basophils Relative: 1.7 % (ref 0.0–3.0)
Eosinophils Absolute: 0.1 K/uL (ref 0.0–0.7)
Eosinophils Relative: 1.4 % (ref 0.0–5.0)
HCT: 38.6 % (ref 36.0–46.0)
Hemoglobin: 13.5 g/dL (ref 12.0–15.0)
Lymphocytes Relative: 36.5 % (ref 12.0–46.0)
Lymphs Abs: 2.3 K/uL (ref 0.7–4.0)
MCHC: 34.9 g/dL (ref 30.0–36.0)
MCV: 92.2 fl (ref 78.0–100.0)
Monocytes Absolute: 0.6 K/uL (ref 0.1–1.0)
Monocytes Relative: 10.1 % (ref 3.0–12.0)
Neutro Abs: 3.1 K/uL (ref 1.4–7.7)
Neutrophils Relative %: 50.3 % (ref 43.0–77.0)
Platelets: 243 K/uL (ref 150.0–400.0)
RBC: 4.19 Mil/uL (ref 3.87–5.11)
RDW: 12.3 % (ref 11.5–15.5)
WBC: 6.2 K/uL (ref 4.0–10.5)

## 2024-10-23 LAB — BASIC METABOLIC PANEL WITH GFR
BUN: 13 mg/dL (ref 6–23)
CO2: 28 meq/L (ref 19–32)
Calcium: 9.3 mg/dL (ref 8.4–10.5)
Chloride: 105 meq/L (ref 96–112)
Creatinine, Ser: 0.82 mg/dL (ref 0.40–1.20)
GFR: 71.45 mL/min (ref >60.00)
Glucose, Bld: 88 mg/dL (ref 70–99)
Potassium: 3.8 meq/L (ref 3.5–5.1)
Sodium: 140 meq/L (ref 135–145)

## 2024-10-23 LAB — HEPATIC FUNCTION PANEL
ALT: 22 U/L (ref 0–35)
AST: 24 U/L (ref 0–37)
Albumin: 4.5 g/dL (ref 3.5–5.2)
Alkaline Phosphatase: 54 U/L (ref 39–117)
Bilirubin, Direct: 0.1 mg/dL (ref 0.0–0.3)
Total Bilirubin: 0.6 mg/dL (ref 0.2–1.2)
Total Protein: 7.1 g/dL (ref 6.0–8.3)

## 2024-10-23 LAB — LIPID PANEL
Cholesterol: 148 mg/dL (ref 0–200)
HDL: 76.3 mg/dL (ref >39.00)
LDL Cholesterol: 61 mg/dL (ref 0–99)
NonHDL: 72.11
Total CHOL/HDL Ratio: 2
Triglycerides: 57 mg/dL (ref 0.0–149.0)
VLDL: 11.4 mg/dL (ref 0.0–40.0)

## 2024-10-25 ENCOUNTER — Ambulatory Visit: Payer: Self-pay | Admitting: Internal Medicine

## 2024-10-26 ENCOUNTER — Ambulatory Visit: Admitting: Internal Medicine

## 2024-10-26 ENCOUNTER — Encounter: Payer: Self-pay | Admitting: Internal Medicine

## 2024-10-26 VITALS — BP 110/68 | HR 96 | Temp 97.8°F | Resp 17 | Ht 68.5 in | Wt 152.2 lb

## 2024-10-26 DIAGNOSIS — R7989 Other specified abnormal findings of blood chemistry: Secondary | ICD-10-CM | POA: Diagnosis not present

## 2024-10-26 DIAGNOSIS — J4520 Mild intermittent asthma, uncomplicated: Secondary | ICD-10-CM | POA: Diagnosis not present

## 2024-10-26 DIAGNOSIS — Z853 Personal history of malignant neoplasm of breast: Secondary | ICD-10-CM | POA: Diagnosis not present

## 2024-10-26 DIAGNOSIS — J069 Acute upper respiratory infection, unspecified: Secondary | ICD-10-CM | POA: Diagnosis not present

## 2024-10-26 DIAGNOSIS — N8110 Cystocele, unspecified: Secondary | ICD-10-CM

## 2024-10-26 DIAGNOSIS — F419 Anxiety disorder, unspecified: Secondary | ICD-10-CM

## 2024-10-26 DIAGNOSIS — K219 Gastro-esophageal reflux disease without esophagitis: Secondary | ICD-10-CM

## 2024-10-26 DIAGNOSIS — Z1231 Encounter for screening mammogram for malignant neoplasm of breast: Secondary | ICD-10-CM | POA: Diagnosis not present

## 2024-10-26 DIAGNOSIS — E7800 Pure hypercholesterolemia, unspecified: Secondary | ICD-10-CM | POA: Diagnosis not present

## 2024-10-26 DIAGNOSIS — I471 Supraventricular tachycardia, unspecified: Secondary | ICD-10-CM

## 2024-10-26 MED ORDER — TRAZODONE HCL 50 MG PO TABS: ORAL_TABLET | ORAL | 1 refills | Status: AC

## 2024-10-26 MED ORDER — SERTRALINE HCL 50 MG PO TABS: 50.0000 mg | ORAL_TABLET | Freq: Every day | ORAL | 1 refills | Status: AC

## 2024-10-26 MED ORDER — ROSUVASTATIN CALCIUM 10 MG PO TABS: 10.0000 mg | ORAL_TABLET | Freq: Every day | ORAL | 1 refills | Status: AC

## 2024-10-26 NOTE — Assessment & Plan Note (Signed)
 Mammogram 02/07/24 - Birads I.

## 2024-10-26 NOTE — Assessment & Plan Note (Signed)
 Currently on crestor . Continue diet and exercise. LDL significantly improved - 61 on recent check. No change in medication. Follow lipid panel.  Lab Results  Component Value Date   CHOL 148 10/23/2024   HDL 76.30 10/23/2024   LDLCALC 61 10/23/2024   TRIG 57.0 10/23/2024   CHOLHDL 2 10/23/2024

## 2024-10-26 NOTE — Progress Notes (Signed)
 Subjective:    [name], female    DOB: 06-02-1951, 73 y.o.   MRN: [id number]  Patient here for  Chief Complaint  Patient presents with   Medical Management of Chronic Issues    4 mth f/u & review labs    HPI Here for a scheduled follow up - follow up regarding GERD, increased stress and hypercholesterolemia. Started crestor  last vsiit. Continues on zoloft . Was having pain - left hip last visit. Xray and saw [facility] 07/09/24. No significant arthritis in the left hip joint. Normal pelvic bones. Bilateral degenerative changes in the SI joints. Felt to have left trochanteric bursitis. Recommended cortisone injection. Injection helped. Has been going to PT - PFPT. Helped. Completed. Given exercises to do at home. Had f/u with cardiology 07/24/24 - f/u pSVT/palpitations. Stable. Off propranolol . Was seen 10/15/24 - persistent cough. Diagnosed with URI. Treated with augmentin , flonase  and hydrocodone  cough syrup. She is doing better. Cough is better. No further coughing fits. No sob. No abdominal pain or bowel change reported.    Past Medical History:  Diagnosis Date   Basal cell carcinoma    multiple removed/[facility]   Breast cancer (HCC) 12/13   s/p right lumpectomy, s/p chemo and XRT   DES exposure in utero    Hepatitis A    History of chicken pox    Hyperactive airway disease    Lupus anticoagulant disorder    Migraines    Miscarriage    x3   Past Surgical History:  Procedure Laterality Date   ABDOMINAL HYSTERECTOMY     BASAL CELL CARCINOMA EXCISION     Multiple   BREAST BIOPSY  1975   Cyst   BREAST LUMPECTOMY WITH AXILLARY LYMPH NODE DISSECTION  01/06/13, 01/22/13   Dr. Georgaide, [facility], second surgery for pos margin   CERVICAL FUSION  2001   COLONOSCOPY     COLONOSCOPY WITH PROPOFOL  N/A 06/11/2022   Procedure: COLONOSCOPY WITH PROPOFOL ;  Surgeon: Unk Corinn Skiff, MD;  Location: [facility];  Service: Gastroenterology;  Laterality: N/A;    DILATION AND CURETTAGE OF UTERUS     x 3   LAPAROSCOPIC BILATERAL SALPINGO OOPHERECTOMY Bilateral 12/21/2020   Procedure: LAPAROSCOPIC BILATERAL SALPINGO OOPHORECTOMY;  Surgeon: Ward, Mitzie BROCKS, MD;  Location: [facility] ORS;  Service: Gynecology;  Laterality: Bilateral;   VAGINAL DELIVERY     x4   WISDOM TOOTH EXTRACTION     Family History  Problem Relation Age of Onset   Pancreatic cancer Mother    Cancer Mother        Pancreatic   Arthritis Mother    Hyperlipidemia Mother    Hypertension Mother    Heart attack Father    Hyperlipidemia Father    Hypertension Father    Heart disease Father 65   Breast cancer Maternal Aunt    Birth defects Maternal Aunt        Breast - in 74's   Breast cancer Maternal Aunt    Birth defects Maternal Aunt        Breast - in 5's   Stroke Maternal Grandmother    Colon cancer Other    Cancer Other 50       Breast and Ovarian- dx 50's/Colon   Social History   Socioeconomic History   Marital status: Married    Spouse name: ED   Number of children: 4   Years of education: Not on file   Highest education level: Bachelor's degree (e.g., BA, AB,  BS)  Occupational History   Occupation: Research Officer, [employer] - [employer]  Tobacco Use   Smoking status: Never   Smokeless tobacco: Never  Substance and Sexual Activity   Alcohol use: Yes    Alcohol/week: 3.0 standard drinks of alcohol    Types: 3 Standard drinks or equivalent per week    Comment: 3 times a week   Drug use: No   Sexual activity: Not on file  Other Topics Concern   Not on file  Social History Narrative   Lives in [place] with husband.  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    Social Drivers of Corporate Investment Banker Strain: Low Risk  (10/22/2024)   Overall Financial Resource Strain (CARDIA)    Difficulty of Paying Living Expenses: Not hard at all  Food Insecurity: No Food Insecurity (10/22/2024)   Hunger Vital Sign    Worried About Running Out of Food in the Last Year: Never true    Ran  Out of Food in the Last Year: Never true  Transportation Needs: No Transportation Needs (10/22/2024)   PRAPARE - Administrator, Civil Service (Medical): No    Lack of Transportation (Non-Medical): No  Physical Activity: Insufficiently Active (10/22/2024)   Exercise Vital Sign    Days of Exercise per Week: 3 days    Minutes of Exercise per Session: 40 min  Stress: No Stress Concern Present (10/22/2024)   [employer] of Occupational Health - Occupational Stress Questionnaire    Feeling of Stress: Only a little  Social Connections: Socially Integrated (10/22/2024)   Social Connection and Isolation Panel    Frequency of Communication with Friends and Family: More than three times a week    Frequency of Social Gatherings with Friends and Family: More than three times a week    Attends Religious Services: More than 4 times per year    Active Member of [employer] or Organizations: Yes    Attends Engineer, Structural: More than 4 times per year    Marital Status: Married     Review of Systems  Constitutional:  Negative for appetite change and unexpected weight change.  HENT:         Congestion better. No sore throat now.   Respiratory:  Negative for chest tightness and shortness of breath.        Cough is better.   Cardiovascular:  Negative for chest pain, palpitations and leg swelling.  Gastrointestinal:  Negative for abdominal pain, diarrhea, nausea and vomiting.  Genitourinary:  Negative for difficulty urinating and dysuria.  Musculoskeletal:  Negative for joint swelling and myalgias.  Skin:  Negative for color change and rash.  Neurological:  Negative for dizziness and headaches.  Psychiatric/Behavioral:  Negative for agitation and dysphoric mood.        Objective:     BP 110/68   Pulse 96   Temp 97.8 F (36.6 C) (Oral)   Resp 17   Ht 5' 8.5 (1.74 m)   Wt 152 lb 4 oz (69.1 kg)   SpO2 96%   BMI 22.81 kg/m  Wt Readings from Last 3 Encounters:   10/26/24 152 lb 4 oz (69.1 kg)  10/15/24 150 lb (68 kg)  07/24/24 150 lb (68 kg)    Physical Exam Vitals reviewed.  Constitutional:      General: She is not in acute distress.    Appearance: Normal appearance.  HENT:     Head: Normocephalic and atraumatic.     Right Ear: External  ear normal.     Left Ear: External ear normal.     Mouth/Throat:     Pharynx: No oropharyngeal exudate or posterior oropharyngeal erythema.  Eyes:     General: No scleral icterus.       Right eye: No discharge.        Left eye: No discharge.     Conjunctiva/sclera: Conjunctivae normal.  Neck:     Thyroid : No thyromegaly.  Cardiovascular:     Rate and Rhythm: Normal rate and regular rhythm.  Pulmonary:     Effort: No respiratory distress.     Breath sounds: Normal breath sounds. No wheezing.  Abdominal:     General: Bowel sounds are normal.     Palpations: Abdomen is soft.     Tenderness: There is no abdominal tenderness.  Musculoskeletal:        General: No swelling or tenderness.     Cervical back: Neck supple. No tenderness.  Lymphadenopathy:     Cervical: No cervical adenopathy.  Skin:    Findings: No erythema or rash.  Neurological:     Mental Status: She is alert.  Psychiatric:        Mood and Affect: Mood normal.        Behavior: Behavior normal.         Outpatient Encounter Medications as of 10/26/2024  Medication Sig   albuterol  (VENTOLIN  HFA) 108 (90 Base) MCG/ACT inhaler INHALE 2 PUFFS INTO THE LUNGS EVERY 6 HOURS AS NEEDED FOR WHEEZING OR SHORTNESS OF BREATH.   alendronate  (FOSAMAX ) 70 MG tablet TAKE 1 TABLET EVERY 7 DAYS WITH A FULL GLASS OF WATER ON AN EMPTY STOMACH DO NOT LIE DOWN FOR AT LEAST 30 MIN   butalbital -aspirin -caffeine  (FIORINAL ) 50-325-40 MG tablet Take 1 tablet by mouth daily as needed for headache.   Cholecalciferol (VITAMIN D3) 125 MCG (5000 UT) CAPS Take 5,000 Units by mouth daily.   EVENING PRIMROSE OIL PO Take 1,300 mg by mouth.    Glucosamine-Chondroitin (MOVE FREE PO) Take 1 capsule by mouth daily. Cartilage/boron/hyalauronic acid   ipratropium (ATROVENT ) 0.06 % nasal spray SPRAY TWICE INTO EACH NOSTRIL TWICE DAILY   LORazepam  (ATIVAN ) 0.5 MG tablet Take 1 tablet (0.5 mg total) by mouth daily as needed. for anxiety   MAGNESIUM BISGLYCINATE PO Take 200 mg by mouth daily.   meloxicam (MOBIC) 7.5 MG tablet Take 7.5 mg by mouth 2 (two) times daily.   Multiple Minerals-Vitamins (CALCIUM  CITRATE PLUS/MAGNESIUM PO) Take 2 tablets by mouth 2 (two) times daily. CAL CITRATE 500-MAGNESIUM 80-ZINC-10   Omega-3 Fatty Acids (OMEGA-3 FISH OIL PO) Take 1,280 mg by mouth 2 (two) times daily.   pantoprazole  (PROTONIX ) 20 MG tablet TAKE 1 TABLET BY MOUTH TWICE A DAY BEFORE A MEAL.   [DISCONTINUED] amoxicillin -clavulanate (AUGMENTIN ) 875-125 MG tablet Take 1 tablet by mouth 2 (two) times daily.   rosuvastatin  (CRESTOR ) 10 MG tablet Take 1 tablet (10 mg total) by mouth daily.   sertraline  (ZOLOFT ) 50 MG tablet Take 1 tablet (50 mg total) by mouth daily.   traZODone  (DESYREL ) 50 MG tablet TAKE 1 TABLET BY MOUTH NIGHTLY AS NEEDEDFOR SLEEP   [DISCONTINUED] BIOTIN PO Take 10,000 mg by mouth daily.   [DISCONTINUED] chlorpheniramine-HYDROcodone  (TUSSIONEX) 10-8 MG/5ML Take 5 mLs by mouth at bedtime as needed.   [DISCONTINUED] fluticasone  (FLONASE ) 50 MCG/ACT nasal spray Place 2 sprays into both nostrils 2 (two) times daily for 7 days.   [DISCONTINUED] propranolol  (INDERAL ) 20 MG tablet Take 1 tablet (20 mg total)  by mouth daily.   [DISCONTINUED] Riboflavin (VITAMIN B2 PO) Take 250 mg by mouth daily.   [DISCONTINUED] rosuvastatin  (CRESTOR ) 10 MG tablet TAKE 1 TABLET BY MOUTH DAILY   [DISCONTINUED] sertraline  (ZOLOFT ) 50 MG tablet TAKE 1 TABLET BY MOUTH DAILY.   [DISCONTINUED] traZODone  (DESYREL ) 50 MG tablet TAKE 1 TABLET BY MOUTH NIGHTLY AS NEEDEDFOR SLEEP.   No facility-administered encounter medications on file as of 10/26/2024.     Lab  Results  Component Value Date   WBC 6.2 10/23/2024   HGB 13.5 10/23/2024   HCT 38.6 10/23/2024   PLT 243.0 10/23/2024   GLUCOSE 88 10/23/2024   CHOL 148 10/23/2024   TRIG 57.0 10/23/2024   HDL 76.30 10/23/2024   LDLCALC 61 10/23/2024   ALT 22 10/23/2024   AST 24 10/23/2024   NA 140 10/23/2024   K 3.8 10/23/2024   CL 105 10/23/2024   CREATININE 0.82 10/23/2024   BUN 13 10/23/2024   CO2 28 10/23/2024   TSH 3.41 06/19/2024   INR 0.9 02/15/2023    [facility] Mammo Result Date: 02/07/2024 This examination belongs to an outside facility and is stored here for comparison purposes only.  Contact the originating outside institution for any associated report or interpretation.  MM [facility] Mammo Result Date: 02/07/2024 This examination belongs to an outside facility and is stored here for comparison purposes only.  Contact the originating outside institution for any associated report or interpretation.  MM [facility] Mammo Result Date: 02/07/2024 This examination belongs to an outside facility and is stored here for comparison purposes only.  Contact the originating outside institution for any associated report or interpretation.  MM [facility] Mammo Result Date: 02/07/2024 This examination belongs to an outside facility and is stored here for comparison purposes only.  Contact the originating outside institution for any associated report or interpretation.  [facility] Mammo Result Date: 02/07/2024 This examination belongs to an outside facility and is stored here for comparison purposes only.  Contact the originating outside institution for any associated report or interpretation.      Assessment & Plan:  Hypercholesterolemia Assessment & Plan: Currently on crestor . Continue diet and exercise. LDL significantly improved - 61 on recent check. No change in medication. Follow lipid panel.  Lab Results  Component Value Date   CHOL 148 10/23/2024   HDL 76.30  10/23/2024   LDLCALC 61 10/23/2024   TRIG 57.0 10/23/2024   CHOLHDL 2 10/23/2024     Orders: -     Lipid panel; Future -     Basic metabolic panel with GFR; Future -     CBC with Differential/Platelet; Future  Visit for screening mammogram -     3D Screening Mammogram, Left and Right; Future  History of breast cancer Assessment & Plan: Mammogram 02/07/24 - [facility].    Abnormal liver function tests Assessment & Plan: Recent liver panel wnl.   Orders: -     Hepatic function panel; Future  Vaginal prolapse Assessment & Plan: PFPT - helped some. Continue home exercise.    URI with cough and congestion Assessment & Plan: Cough and congestion improved. Continue neti pot. Follow.    Paroxysmal SVT (supraventricular tachycardia) Assessment & Plan: Off propranolol . Doing well. Follow.    Anxiety Assessment & Plan: Continue zoloft . Appears to be doing well. Follow.    Mild intermittent asthma, unspecified whether complicated Assessment & Plan: Breathing stable.    Gastroesophageal reflux disease, unspecified whether esophagitis present Assessment &  Plan: Upper symptoms controlled. Continues on protonix  20mg  q day.    Other orders -     Rosuvastatin  Calcium ; Take 1 tablet (10 mg total) by mouth daily.  Dispense: 90 tablet; Refill: 1 -     Sertraline  HCl; Take 1 tablet (50 mg total) by mouth daily.  Dispense: 90 tablet; Refill: 1 -     traZODone  HCl; TAKE 1 TABLET BY MOUTH NIGHTLY AS NEEDEDFOR SLEEP  Dispense: 90 tablet; Refill: 1     Allena Hamilton, MD

## 2024-10-29 ENCOUNTER — Ambulatory Visit: Admitting: Physical Therapy

## 2024-10-29 DIAGNOSIS — D225 Melanocytic nevi of trunk: Secondary | ICD-10-CM | POA: Diagnosis not present

## 2024-10-29 DIAGNOSIS — D2272 Melanocytic nevi of left lower limb, including hip: Secondary | ICD-10-CM | POA: Diagnosis not present

## 2024-10-29 DIAGNOSIS — D2261 Melanocytic nevi of right upper limb, including shoulder: Secondary | ICD-10-CM | POA: Diagnosis not present

## 2024-10-29 DIAGNOSIS — L570 Actinic keratosis: Secondary | ICD-10-CM | POA: Diagnosis not present

## 2024-10-29 DIAGNOSIS — D2262 Melanocytic nevi of left upper limb, including shoulder: Secondary | ICD-10-CM | POA: Diagnosis not present

## 2024-10-29 DIAGNOSIS — Z86006 Personal history of melanoma in-situ: Secondary | ICD-10-CM | POA: Diagnosis not present

## 2024-10-31 ENCOUNTER — Encounter: Payer: Self-pay | Admitting: Internal Medicine

## 2024-10-31 NOTE — Assessment & Plan Note (Signed)
 Continue zoloft . Appears to be doing well. Follow.

## 2024-10-31 NOTE — Assessment & Plan Note (Signed)
 Off propranolol . Doing well. Follow.

## 2024-10-31 NOTE — Assessment & Plan Note (Signed)
Recent liver panel wnl.  

## 2024-10-31 NOTE — Assessment & Plan Note (Signed)
 Upper symptoms controlled. Continues on protonix  20mg  q day.

## 2024-10-31 NOTE — Assessment & Plan Note (Signed)
 Cough and congestion improved. Continue neti pot. Follow.

## 2024-10-31 NOTE — Assessment & Plan Note (Signed)
 Breathing stable.

## 2024-10-31 NOTE — Assessment & Plan Note (Signed)
 PFPT - helped some. Continue home exercise.

## 2024-11-05 ENCOUNTER — Ambulatory Visit: Admitting: Physical Therapy

## 2024-11-11 ENCOUNTER — Other Ambulatory Visit: Payer: Self-pay | Admitting: Nurse Practitioner

## 2024-11-12 ENCOUNTER — Ambulatory Visit: Admitting: Physical Therapy

## 2024-11-26 ENCOUNTER — Ambulatory Visit: Admitting: Physical Therapy

## 2024-11-28 DIAGNOSIS — N3001 Acute cystitis with hematuria: Secondary | ICD-10-CM | POA: Diagnosis not present

## 2024-11-28 DIAGNOSIS — R300 Dysuria: Secondary | ICD-10-CM | POA: Diagnosis not present

## 2024-12-03 ENCOUNTER — Ambulatory Visit: Admitting: Physical Therapy

## 2024-12-25 ENCOUNTER — Telehealth: Payer: Self-pay | Admitting: Cardiology

## 2024-12-25 NOTE — Telephone Encounter (Signed)
 Returned pt's call; verified identity using 2 identifiers; pt has concerns with her blood pressure fluctuating from high to low; she states that her BP was 98/62, but then later in the evening her BP 159/108; pt is currently not on any BP medications; asked pt if she had taken her albuterol  inhaler prior to her increase in BP; pt states no, but she did have her family (including 8 grandchildren) over at her home; I expressed that her increase could have been due to stress; she states that she drank 2 glasses of water and re-took her blood pressure every 10 minutes and it eventually trend down; I explained that she did not have to take her BP so often because she would most likely get a different reading each time; her blood pressure this morning was back to normal; 98/63.  Pt requested an appointment with [facility], but she is out of the office next week, therefore, pt agreed to see Lesley Maffucci, PA-C to discuss her BP.  Pt thanked me for returning her call.

## 2024-12-25 NOTE — Telephone Encounter (Signed)
 Pt c/o BP issue: STAT if pt c/o blurred vision, one-sided weakness or slurred speech.  STAT if BP is GREATER than 180/120 TODAY.  STAT if BP is LESS than 90/60 and SYMPTOMATIC TODAY  1. What is your BP concern?   See below  2. Have you taken any BP medication today?  Not taking any medications  3. What are your last 5 BP readings?  159/108 - last night 98/63 - this morning  4. Are you having any other symptoms (ex. Dizziness, headache, blurred vision, passed out)?   Lightheadedness   Patient stated her BP has been fluctuating and wants advice on next steps.  See also patient's MyChart message.

## 2024-12-25 NOTE — Progress Notes (Signed)
 "  Cardiology Office Note    Date:  12/28/2024   ID:  Cindy Arnold, DOB [birth year], MRN [id number]  PCP:  Glendia Shad, MD  Cardiologist:  Redell Cave, MD  Electrophysiologist:  None   Chief Complaint: Palpitations  History of Present Illness:   Cindy Arnold is a 74 y.o. female with history of paroxysmal SVT, breast cancer s/p lumpectomy/chemo plus XRT, ophthalmic migraines, and hyperlipidemia who presents for evaluation of palpitations.    Patient has a history of palpitations with prior cardiac monitor in 2022 showing occasional paroxysmal SVT, previously managed with propranolol .   Patient was most recently seen in office 07/24/2024 overall doing well from a cardiac perspective.  She reported occasional heart fluttering that was not bothersome.  No further testing or medication changes were made at that time.  Patient reports that on New Year's Day she felt off.  She notes feeling uneasy with intermittent palpitations.  She checked her blood pressure and noted it to be elevated.  She continued to take it throughout the night with improvement back to her baseline.  In the days since, she reports intermittent episodes of this feeling of uneasiness with intermittent palpitations.  She notes some lightheadedness, although this is difficult to differentiate from her chronic eye condition.  Her blood pressure typically runs low but she has had some intermittent readings in the 130s to 150s systolic which is concerning to her.  She denies chest pain, shortness of breath, and lower extremity swelling.  Labs independently reviewed: 09/2024-Hgb 13.5, HCT 38.6, platelets 243, sodium 140, potassium 3.8, BUN 13, creatinine 0.82, TC 148, TG 57, HDL 76, LDL 61, normal LFTs  Objective   Past Medical History:  Diagnosis Date   Basal cell carcinoma    multiple removed/[facility]   Breast cancer (HCC) 12/13   s/p right lumpectomy, s/p chemo and XRT   DES exposure in utero     Hepatitis A    History of chicken pox    Hyperactive airway disease    Lupus anticoagulant disorder    Migraines    Miscarriage    x3    Current Medications: Active Medications[1]  Allergies:   Prednisone    Social History   Socioeconomic History   Marital status: Married    Spouse name: ED   Number of children: 4   Years of education: Not on file   Highest education level: Bachelor's degree (e.g., BA, AB, BS)  Occupational History   Occupation: Research Officer, [employer] - [employer]  Tobacco Use   Smoking status: Never   Smokeless tobacco: Never  Substance and Sexual Activity   Alcohol use: Yes    Alcohol/week: 3.0 standard drinks of alcohol    Types: 3 Standard drinks or equivalent per week    Comment: 3 times a week   Drug use: No   Sexual activity: Not on file  Other Topics Concern   Not on file  Social History Narrative   Lives in [place] with husband.  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    Social Drivers of Health   Tobacco Use: Low Risk (12/28/2024)   Patient History    Smoking Tobacco Use: Never    Smokeless Tobacco Use: Never    Passive Exposure: Not on file  Recent Concern: Tobacco Use - Medium Risk (11/28/2024)   Received from [facility]   Patient History    Smoking Tobacco Use: Former    Smokeless Tobacco Use: Never    Passive Exposure:  Not on file  Financial Resource Strain: Low Risk (10/22/2024)   Overall Financial Resource Strain (CARDIA)    Difficulty of Paying Living Expenses: Not hard at all  Food Insecurity: No Food Insecurity (10/22/2024)   Epic    Worried About Programme Researcher, Broadcasting/film/video in the Last Year: Never true    Ran Out of Food in the Last Year: Never true  Transportation Needs: No Transportation Needs (10/22/2024)   Epic    Lack of Transportation (Medical): No    Lack of Transportation (Non-Medical): No  Physical Activity: Insufficiently Active (10/22/2024)   Exercise Vital Sign    Days of Exercise per Week: 3 days    Minutes of  Exercise per Session: 40 min  Stress: No Stress Concern Present (10/22/2024)   [employer] of Occupational Health - Occupational Stress Questionnaire    Feeling of Stress: Only a little  Social Connections: Socially Integrated (10/22/2024)   Social Connection and Isolation Panel    Frequency of Communication with Friends and Family: More than three times a week    Frequency of Social Gatherings with Friends and Family: More than three times a week    Attends Religious Services: More than 4 times per year    Active Member of Clubs or Organizations: Yes    Attends Banker Meetings: More than 4 times per year    Marital Status: Married  Depression (PHQ2-9): Low Risk (10/26/2024)   Depression (PHQ2-9)    PHQ-2 Score: 2  Alcohol Screen: Low Risk (10/22/2024)   Alcohol Screen    Last Alcohol Screening Score (AUDIT): 3  Housing: Low Risk (10/22/2024)   Epic    Unable to Pay for Housing in the Last Year: No    Number of Times Moved in the Last Year: 0    Homeless in the Last Year: No  Utilities: Not At Risk (06/24/2023)   AHC Utilities    Threatened with loss of utilities: No  Health Literacy: Not on file     Family History:  The patient's family history includes Arthritis in her mother; Birth defects in her maternal aunt and maternal aunt; Breast cancer in her maternal aunt and maternal aunt; Cancer in her mother; Cancer (age of onset: 82) in an other family member; Colon cancer in an other family member; Heart attack in her father; Heart disease (age of onset: 18) in her father; Hyperlipidemia in her father and mother; Hypertension in her father and mother; Pancreatic cancer in her mother; Stroke in her maternal grandmother.  ROS:   12-point review of systems is negative unless otherwise noted in the HPI.  EKGs/Other Studies Reviewed:    Studies reviewed were summarized above. The additional studies were reviewed today:  02/2021 Long term monitor Patient had a min HR  of 46 bpm, max HR of 182 bpm, and avg HR of 67 bpm. Predominant underlying rhythm was Sinus Rhythm. Occasional paroxysmal SVT noted. Isolated SVEs were rare (<1.0%), SVE Couplets were rare (<1.0%), and SVE Triplets were rare (<1.0%).   EKG:  EKG personally reviewed by me today EKG Interpretation Date/Time:  Monday December 28 2024 08:35:02 EST Ventricular Rate:  80 PR Interval:  148 QRS Duration:  84 QT Interval:  372 QTC Calculation: 429 R Axis:   8  Text Interpretation: Sinus rhythm with Premature atrial complexes Confirmed by Lorene Sinclair ([id number]) on 12/28/2024 8:37:59 AM  PHYSICAL EXAM:    VS:  BP 102/60 (BP Location: Left Arm, Patient Position: Sitting, Cuff Size: Normal)  Pulse 80   Ht 5' 8.5 (1.74 m)   Wt 151 lb 6 oz (68.7 kg)   SpO2 98%   BMI 22.68 kg/m   BMI: Body mass index is 22.68 kg/m.  GEN: Well nourished, well developed in no acute distress NECK: No JVD; No carotid bruits CARDIAC: RRR, no murmurs, rubs, gallops RESPIRATORY:  Clear to auscultation without rales, wheezing or rhonchi  ABDOMEN: Soft, non-tender, non-distended EXTREMITIES: No edema; No deformity  Wt Readings from Last 3 Encounters:  12/28/24 151 lb 6 oz (68.7 kg)  10/26/24 152 lb 4 oz (69.1 kg)  10/15/24 150 lb (68 kg)                  ASSESSMENT & PLAN:   Palpitations PSVT - Intermittent episodes of feeling of uneasiness with palpitations. History of PSVT on prior monitor in 2022. No longer on BB therapy. Recommend updating Zio monitor with consideration for resumption of BB pending results. Check BMP, TSH, and mag today.   Hypotension - BP typically runs low. Patient notes intermittent high readings. Continue to monitor.     Disposition: Check BMP, TSH, and magnesium.  Obtain 2 weeks ZIO XT.  F/u with Dr. Darliss or an APP in 6 to 8 weeks.   Medication Adjustments/Labs and Tests Ordered: Current medicines are reviewed at length with the patient today.  Concerns regarding  medicines are outlined above. Medication changes, Labs and Tests ordered today are summarized above and listed in the Patient Instructions accessible in Encounters.   Bonney Lesley Maffucci, PA-C 12/28/2024 12:12 PM     [facility] [place] [address] [place], [place] [address] [phone]      [1]  Current Meds  Medication Sig   albuterol  (VENTOLIN  HFA) 108 (90 Base) MCG/ACT inhaler INHALE 2 PUFFS INTO THE LUNGS EVERY 6 HOURS AS NEEDED FOR WHEEZING OR SHORTNESS OF BREATH.   alendronate  (FOSAMAX ) 70 MG tablet TAKE 1 TABLET EVERY 7 DAYS WITH A FULL GLASS OF WATER ON AN EMPTY STOMACH DO NOT LIE DOWN FOR AT LEAST 30 MIN   butalbital -aspirin -caffeine  (FIORINAL ) 50-325-40 MG tablet Take 1 tablet by mouth daily as needed for headache.   CALCIUM  PO Take by mouth daily at 8 pm. (Patient taking differently: Take by mouth 2 times daily at 12 noon and 4 pm.)   Cholecalciferol (VITAMIN D3) 125 MCG (5000 UT) CAPS Take 5,000 Units by mouth daily.   EVENING PRIMROSE OIL PO Take 1,300 mg by mouth.   Glucosamine-Chondroitin (MOVE FREE PO) Take 1 capsule by mouth daily. Cartilage/boron/hyalauronic acid   ipratropium (ATROVENT ) 0.06 % nasal spray SPRAY TWICE INTO EACH NOSTRIL TWICE DAILY   LORazepam  (ATIVAN ) 0.5 MG tablet Take 1 tablet (0.5 mg total) by mouth daily as needed. for anxiety   MAGNESIUM BISGLYCINATE PO Take 200 mg by mouth daily.   meloxicam (MOBIC) 7.5 MG tablet Take 7.5 mg by mouth 2 (two) times daily.   Multiple Minerals-Vitamins (CALCIUM  CITRATE PLUS/MAGNESIUM PO) Take 2 tablets by mouth 2 (two) times daily. CAL CITRATE 500-MAGNESIUM 80-ZINC-10   Multiple Vitamins-Minerals (ZINC PO) Take by mouth daily.   Omega-3 Fatty Acids (OMEGA-3 FISH OIL PO) Take 1,280 mg by mouth 2 (two) times daily.   pantoprazole  (PROTONIX ) 20 MG tablet TAKE 1 TABLET BY MOUTH TWICE A DAY BEFORE A MEAL.   rosuvastatin  (CRESTOR ) 10 MG tablet Take 1 tablet (10 mg total) by mouth daily.    sertraline  (ZOLOFT ) 50 MG tablet Take 1 tablet (  50 mg total) by mouth daily.   traZODone  (DESYREL ) 50 MG tablet TAKE 1 TABLET BY MOUTH NIGHTLY AS NEEDEDFOR SLEEP   "

## 2024-12-28 ENCOUNTER — Ambulatory Visit

## 2024-12-28 ENCOUNTER — Encounter: Payer: Self-pay | Admitting: Physician Assistant

## 2024-12-28 ENCOUNTER — Ambulatory Visit: Attending: Physician Assistant | Admitting: Physician Assistant

## 2024-12-28 VITALS — BP 102/60 | HR 80 | Ht 68.5 in | Wt 151.4 lb

## 2024-12-28 DIAGNOSIS — I471 Supraventricular tachycardia, unspecified: Secondary | ICD-10-CM

## 2024-12-28 DIAGNOSIS — R002 Palpitations: Secondary | ICD-10-CM | POA: Diagnosis not present

## 2024-12-28 DIAGNOSIS — I959 Hypotension, unspecified: Secondary | ICD-10-CM | POA: Diagnosis not present

## 2024-12-28 DIAGNOSIS — E782 Mixed hyperlipidemia: Secondary | ICD-10-CM

## 2024-12-28 NOTE — Patient Instructions (Signed)
 Medication Instructions:  Your physician recommends that you continue on your current medications as directed. Please refer to the Current Medication list given to you today.  *If you need a refill on your cardiac medications before your next appointment, please call your pharmacy*  Lab Work: Your provider would like for you to have following labs drawn today BMET, TSH and MAG.    Testing/Procedures: Your physician has recommended that you wear a Zio monitor.   This monitor is a medical device that records the hearts electrical activity. Doctors most often use these monitors to diagnose arrhythmias. Arrhythmias are problems with the speed or rhythm of the heartbeat. The monitor is a small device applied to your chest. You can wear one while you do your normal daily activities. While wearing this monitor if you have any symptoms to push the button and record what you felt. Once you have worn this monitor for the period of time provider prescribed (Usually 14 days), you will return the monitor device in the postage paid box. Once it is returned they will download the data collected and provide us  with a report which the provider will then review and we will call you with those results. Important tips:  Avoid showering during the first 24 hours of wearing the monitor. Avoid excessive sweating to help maximize wear time. Do not submerge the device, no hot tubs, and no swimming pools. Keep any lotions or oils away from the patch. After 24 hours you may shower with the patch on. Take brief showers with your back facing the shower head.  Do not remove patch once it has been placed because that will interrupt data and decrease adhesive wear time. Push the button when you have any symptoms and write down what you were feeling. Once you have completed wearing your monitor, remove and place into box which has postage paid and place in your outgoing mailbox.  If for some reason you have misplaced your box  then call our office and we can provide another box and/or mail it off for you.     Follow-Up: At [facility], you and your health needs are our priority.  As part of our continuing mission to provide you with exceptional heart care, our providers are all part of one team.  This team includes your primary Cardiologist (physician) and Advanced Practice Providers or APPs (Physician Assistants and Nurse Practitioners) who all work together to provide you with the care you need, when you need it.  Your next appointment:   6-8 week(s)  Provider:   You may see Redell Cave, MD or one of the following Advanced Practice Providers on your designated Care Team:   Lonni Meager, NP Lesley Maffucci, PA-C Bernardino Bring, PA-C Cadence Helen, PA-C Tylene Lunch, NP Barnie Hila, NP

## 2024-12-29 ENCOUNTER — Ambulatory Visit: Payer: Self-pay | Admitting: Physician Assistant

## 2024-12-29 LAB — BASIC METABOLIC PANEL WITH GFR
BUN/Creatinine Ratio: 21 (ref 12–28)
BUN: 16 mg/dL (ref 8–27)
CO2: 21 mmol/L (ref 20–29)
Calcium: 9.8 mg/dL (ref 8.7–10.3)
Chloride: 105 mmol/L (ref 96–106)
Creatinine, Ser: 0.75 mg/dL (ref 0.57–1.00)
Glucose: 104 mg/dL — ABNORMAL HIGH (ref 70–99)
Potassium: 4.4 mmol/L (ref 3.5–5.2)
Sodium: 137 mmol/L (ref 134–144)
eGFR: 85 mL/min/1.73

## 2024-12-29 LAB — MAGNESIUM: Magnesium: 2.1 mg/dL (ref 1.6–2.3)

## 2024-12-29 LAB — TSH: TSH: 3.07 u[IU]/mL (ref 0.450–4.500)

## 2024-12-31 ENCOUNTER — Other Ambulatory Visit: Payer: Self-pay | Admitting: Internal Medicine

## 2025-01-09 ENCOUNTER — Other Ambulatory Visit: Payer: Self-pay | Admitting: Internal Medicine

## 2025-01-25 DIAGNOSIS — E782 Mixed hyperlipidemia: Secondary | ICD-10-CM | POA: Diagnosis not present

## 2025-01-25 DIAGNOSIS — I471 Supraventricular tachycardia, unspecified: Secondary | ICD-10-CM

## 2025-01-25 NOTE — Telephone Encounter (Signed)
 Called and left voicemail for the patient to call back. Call back number provided.

## 2025-01-25 NOTE — Telephone Encounter (Signed)
-----   Message from Lesley LITTIE Maffucci sent at 01/25/2025  5:01 PM EST ----- Heart monitor showed predominately normal sinus rhythm with several episodes of a fast heart rhythm originating from the top part of the heart (SVT), frequent early heartbeats originating from the  top part of the heart, and less than 1% burden of atrial fibrillation.  Would recommend starting Eliquis  5 mg twice daily to reduce risk of stroke and moving up appointment with Cadence to the next 1  to 2 weeks to further discuss.

## 2025-01-26 MED ORDER — APIXABAN 5 MG PO TABS: 5.0000 mg | ORAL_TABLET | Freq: Two times a day (BID) | ORAL | 11 refills | Status: AC

## 2025-01-26 NOTE — Addendum Note (Signed)
 Addended by: Makenize Messman D on: 01/26/2025 10:07 AM   Modules accepted: Orders

## 2025-02-01 ENCOUNTER — Ambulatory Visit: Admitting: Medical

## 2025-02-04 ENCOUNTER — Encounter

## 2025-02-16 ENCOUNTER — Ambulatory Visit: Admitting: Medical

## 2025-03-12 ENCOUNTER — Other Ambulatory Visit

## 2025-03-15 ENCOUNTER — Encounter: Admitting: Internal Medicine
